# Patient Record
Sex: Male | Born: 1945 | Race: White | Hispanic: No | Smoking: Current every day smoker
Health system: Southern US, Community
[De-identification: ages and names within clinical notes are randomized; demographics above are authoritative.]

## PROBLEM LIST (undated history)

## (undated) DIAGNOSIS — U071 COVID-19: Secondary | ICD-10-CM

## (undated) DIAGNOSIS — F1010 Alcohol abuse, uncomplicated: Secondary | ICD-10-CM

## (undated) DIAGNOSIS — K219 Gastro-esophageal reflux disease without esophagitis: Secondary | ICD-10-CM

## (undated) DIAGNOSIS — R011 Cardiac murmur, unspecified: Secondary | ICD-10-CM

## (undated) DIAGNOSIS — I484 Atypical atrial flutter: Secondary | ICD-10-CM

## (undated) DIAGNOSIS — I4891 Unspecified atrial fibrillation: Secondary | ICD-10-CM

## (undated) DIAGNOSIS — E785 Hyperlipidemia, unspecified: Secondary | ICD-10-CM

## (undated) DIAGNOSIS — J449 Chronic obstructive pulmonary disease, unspecified: Secondary | ICD-10-CM

## (undated) DIAGNOSIS — R002 Palpitations: Secondary | ICD-10-CM

## (undated) DIAGNOSIS — J189 Pneumonia, unspecified organism: Secondary | ICD-10-CM

## (undated) DIAGNOSIS — I639 Cerebral infarction, unspecified: Secondary | ICD-10-CM

## (undated) DIAGNOSIS — I10 Essential (primary) hypertension: Secondary | ICD-10-CM

## (undated) DIAGNOSIS — D649 Anemia, unspecified: Secondary | ICD-10-CM

## (undated) DIAGNOSIS — C4490 Unspecified malignant neoplasm of skin, unspecified: Secondary | ICD-10-CM

## (undated) HISTORY — DX: Essential (primary) hypertension: I10

## (undated) HISTORY — DX: Palpitations: R00.2

## (undated) HISTORY — DX: Cerebral infarction, unspecified: I63.9

## (undated) HISTORY — PX: FRACTURE SURGERY: SHX138

## (undated) HISTORY — PX: ATRIAL FIBRILLATION ABLATION: SHX5732

## (undated) HISTORY — DX: Hyperlipidemia, unspecified: E78.5

## (undated) HISTORY — DX: Atypical atrial flutter: I48.4

## (undated) HISTORY — PX: TONSILLECTOMY: SUR1361

## (undated) HISTORY — PX: EYE SURGERY: SHX253

## (undated) HISTORY — PX: APPENDECTOMY: SHX54

## (undated) HISTORY — DX: Alcohol abuse, uncomplicated: F10.10

---

## 2005-12-04 ENCOUNTER — Inpatient Hospital Stay (HOSPITAL_COMMUNITY): Admission: EM | Admit: 2005-12-04 | Discharge: 2005-12-05 | Payer: Self-pay | Admitting: Emergency Medicine

## 2006-01-10 ENCOUNTER — Emergency Department (HOSPITAL_COMMUNITY): Admission: EM | Admit: 2006-01-10 | Discharge: 2006-01-10 | Payer: Self-pay | Admitting: Emergency Medicine

## 2006-02-01 ENCOUNTER — Emergency Department (HOSPITAL_COMMUNITY): Admission: EM | Admit: 2006-02-01 | Discharge: 2006-02-01 | Payer: Self-pay | Admitting: Emergency Medicine

## 2006-05-02 ENCOUNTER — Observation Stay (HOSPITAL_COMMUNITY): Admission: EM | Admit: 2006-05-02 | Discharge: 2006-05-02 | Payer: Self-pay | Admitting: Emergency Medicine

## 2006-11-15 ENCOUNTER — Inpatient Hospital Stay (HOSPITAL_COMMUNITY): Admission: EM | Admit: 2006-11-15 | Discharge: 2006-11-20 | Payer: Self-pay | Admitting: Emergency Medicine

## 2006-11-15 ENCOUNTER — Encounter (INDEPENDENT_AMBULATORY_CARE_PROVIDER_SITE_OTHER): Payer: Self-pay | Admitting: Cardiology

## 2006-11-15 ENCOUNTER — Ambulatory Visit: Payer: Self-pay | Admitting: Vascular Surgery

## 2006-11-15 ENCOUNTER — Encounter: Payer: Self-pay | Admitting: Vascular Surgery

## 2007-11-18 ENCOUNTER — Ambulatory Visit: Payer: Self-pay | Admitting: Internal Medicine

## 2007-12-10 ENCOUNTER — Ambulatory Visit: Payer: Self-pay | Admitting: Internal Medicine

## 2007-12-10 ENCOUNTER — Encounter: Payer: Self-pay | Admitting: Internal Medicine

## 2008-04-24 ENCOUNTER — Ambulatory Visit: Payer: Self-pay | Admitting: Internal Medicine

## 2008-05-07 ENCOUNTER — Ambulatory Visit: Payer: Self-pay | Admitting: Internal Medicine

## 2008-05-07 ENCOUNTER — Encounter: Payer: Self-pay | Admitting: Internal Medicine

## 2008-05-08 ENCOUNTER — Encounter: Payer: Self-pay | Admitting: Internal Medicine

## 2009-04-02 ENCOUNTER — Ambulatory Visit (HOSPITAL_COMMUNITY): Admission: RE | Admit: 2009-04-02 | Discharge: 2009-04-03 | Payer: Self-pay | Admitting: Orthopedic Surgery

## 2009-10-01 ENCOUNTER — Encounter: Payer: Self-pay | Admitting: Internal Medicine

## 2009-10-01 ENCOUNTER — Telehealth: Payer: Self-pay | Admitting: Internal Medicine

## 2010-05-06 ENCOUNTER — Encounter: Payer: Self-pay | Admitting: Internal Medicine

## 2010-10-13 NOTE — Medication Information (Signed)
Summary: Omeprazole/[facility]  Omeprazole/[facility]   Imported By: Sherian Rein 10/06/2009 14:47:15  _____________________________________________________________________  External Attachment:    Type:   Image     Comment:   External Document

## 2010-10-13 NOTE — Progress Notes (Signed)
Summary: Medication Refill   Phone Note From Pharmacy   Caller: Aris Georgia  [phone] Call For: Dr. Marina Goodell  Summary of Call: Wants to know if Pt. can get a 3 months supply of Omeprazole because he will be without insurance for a couple of months Initial call taken by: Karna Christmas,  October 01, 2009 11:12 AM  Follow-up for Phone Call        Rx. faxed to [facility] for 90 day supply. Follow-up by: Milford Cage NCMA,  October 01, 2009 4:20 PM

## 2010-10-13 NOTE — Letter (Signed)
Summary: Colonoscopy Letter  [facility]  [address] [place], [place] [address]   Phone: [phone]  Fax: [phone]      May 06, 2010 MRN: [id number]   [facility] [address] [place], [place]  [address]   Dear Mr. EVENSON,   According to your medical record, it is time for you to schedule a Colonoscopy. The American Cancer Society recommends this procedure as a method to detect early colon cancer. Patients with a family history of colon cancer, or a personal history of colon polyps or inflammatory bowel disease are at increased risk.  This letter has been generated based on the recommendations made at the time of your procedure. If you feel that in your particular situation this may no longer apply, please contact our office.  Please call our office at [phone] to schedule this appointment or to update your records at your earliest convenience.  Thank you for cooperating with [place] to provide you with the very best care possible.   Sincerely,  Wilhemina Bonito. Marina Goodell, M.D.  [facility] [phone]

## 2010-11-03 ENCOUNTER — Encounter: Payer: Self-pay | Admitting: Internal Medicine

## 2010-11-07 ENCOUNTER — Encounter: Payer: Self-pay | Admitting: Internal Medicine

## 2010-11-17 NOTE — Medication Information (Signed)
Summary: [facility]  [facility]   Imported By: Lester [place] 11/07/2010 10:19:03  _____________________________________________________________________  External Attachment:    Type:   Image     Comment:   External Document

## 2010-11-17 NOTE — Medication Information (Signed)
Summary: Omeprazole / [facility]  Omeprazole / [facility]   Imported By: Lennie Odor 11/08/2010 11:43:25  _____________________________________________________________________  External Attachment:    Type:   Image     Comment:   External Document

## 2010-12-18 LAB — DIFFERENTIAL
Basophils Relative: 1 % (ref 0–1)
Eosinophils Absolute: 0.1 10*3/uL (ref 0.0–0.7)
Monocytes Absolute: 0.4 10*3/uL (ref 0.1–1.0)
Monocytes Relative: 7 % (ref 3–12)

## 2010-12-18 LAB — BASIC METABOLIC PANEL
BUN: 19 mg/dL (ref 6–23)
CO2: 26 mEq/L (ref 19–32)
Chloride: 105 mEq/L (ref 96–112)
Creatinine, Ser: 0.82 mg/dL (ref 0.4–1.5)
Glucose, Bld: 102 mg/dL — ABNORMAL HIGH (ref 70–99)
Potassium: 4.4 mEq/L (ref 3.5–5.1)

## 2010-12-18 LAB — CBC
MCV: 96.5 fL (ref 78.0–100.0)
WBC: 6 10*3/uL (ref 4.0–10.5)

## 2010-12-18 LAB — URINALYSIS, ROUTINE W REFLEX MICROSCOPIC
Ketones, ur: NEGATIVE mg/dL
Nitrite: NEGATIVE
Protein, ur: NEGATIVE mg/dL
Specific Gravity, Urine: 1.012 (ref 1.005–1.030)
pH: 5 (ref 5.0–8.0)

## 2010-12-18 LAB — PROTIME-INR
INR: 1.1 (ref 0.00–1.49)
INR: 1.6 — ABNORMAL HIGH (ref 0.00–1.49)
Prothrombin Time: 19.7 seconds — ABNORMAL HIGH (ref 11.6–15.2)

## 2011-01-24 NOTE — Op Note (Signed)
Samuel Ferrell, Samuel Ferrell NO.:  [id number]   MEDICAL RECORD NO.:  [id number]          PATIENT TYPE:  OIB   LOCATION:  5038                         FACILITY:  [facility]   PHYSICIAN:  Almedia Balls. Ranell Patrick, M.D. DATE OF BIRTH:  12-Mar-1946   DATE OF PROCEDURE:  04/02/2009  DATE OF DISCHARGE:                               OPERATIVE REPORT   PREOPERATIVE DIAGNOSIS:  Right grade 5 acromioclavicular separation.   POSTOPERATIVE DIAGNOSIS:  Right grade 5 acromioclavicular separation.   PROCEDURE PERFORMED:  Right shoulder open reduction of acromioclavicular  joint with coracoclavicular fixation using Arthrex TightRope.   ATTENDING SURGEON:  Almedia Balls. Ranell Patrick, MD   ASSISTANT:  None.   ANESTHESIA:  General anesthesia plus interscalene block anesthesia was  used.   ESTIMATED BLOOD LOSS:  Minimal.   FLUID REPLACEMENT:  800 mL of crystalloid.   INSTRUMENT COUNTS:  Correct.   There were no complications.   Perioperative antibiotics were given.   INDICATIONS:  The patient is a 65 year old male who suffered a fall  injuring his right shoulder.  The patient presented to Orthopedics with  a very painful grade 5 AC separation.  I counseled the patient regarding  treatment options including conservative versus surgical treatment.  The  patient elected to proceed with surgery.  Informed consent was obtained.   DESCRIPTION OF PROCEDURE:  After an adequate level of anesthesia was  achieved, the patient was placed in a modified beach-chair position.  The right shoulder was sterilely prepped and draped in the usual manner.  We initially made an incision about a centimeter medial to the [facility] joint  in Langer skin lines.  This was a saber-type incision.  Dissection was  carried down through subcutaneous tissues using the needle tip Bovie,  identified the deltotrapezial fascia, and split that in line with the  distal clavicle.  Subperiosteal dissection of the distal clavicle was  performed.   We removed the torn disk in between the clavicle and the  acromion and inspected the [facility] joint to make sure that was cleaned out.  We then went ahead and made a separate incision just lateral to the  coracoid process and this was about a 4-cm to 5-cm incision.  We found  the deltopectoral interval and mobilized the cephalic vein laterally  with the deltoid, identified the coracoid process and cleared out the  soft tissue overlying the top of the coracoid process and also split the  conjoined tendon distal to the coracoid and bluntly dissected with a  hemostat underneath the coracoid process.  We then went ahead and  drilled using a 4.5 Arthrex drill bit centered on the clavicle directly  overlying the coracoid process.  Using the C-arm, we drilled both  cortices of clavicle.  We then through a separate hole through the  deltoid anterior to the clavicle, we went ahead and drilled the coracoid  process and mid coracoid and made sure that was centered as well.  We  then passed the TightRope anchor from the superior portion of the  clavicle down through the coracoclavicular interval through the coracoid  process and flipped the Arthrex TightRope button on the underside of the  coracoid process and brought that up securely against the coracoid.  We  imaged that with the C-arm to make sure we were turned and then we went  ahead and cinched down the TightRope device having the middle button on  top of the clavicle.  We lifted up on the elbow and then pushed down the  clavicle to make sure we had best reduction we could.  Once the  TightRope was secure and we had good visualization with the C-arm, we  went ahead and tied it.  We tied this with FiberWire sutures and then  following completion of that we got our final images.  We thoroughly  irrigated and closed the deltotrapezial fascia with 0 Vicryl figure-of-  eight suture followed by 2-0 Vicryl subcutaneous closure, and 4-0  Monocryl for skin.   We used 0 Vicryl for the deltopectoral closure, 2-0  Vicryl subcutaneous closure, and 4-0 running Monocryl skin.  Steri-  Strips were applied followed by a sterile compressive bandage and  shoulder sling immobilizer.  The patient tolerated the surgery well.      Almedia Balls. Ranell Patrick, M.D.  Electronically Signed     SRN/MEDQ  D:  04/02/2009  T:  04/03/2009  Job:  [id number]

## 2011-01-24 NOTE — Assessment & Plan Note (Signed)
[facility]                         GASTROENTEROLOGY OFFICE NOTE   Samuel Ferrell, Samuel Ferrell                         MRN:          [id number]  DATE:11/18/2007                            DOB:          Nov 15, 1945    REASON FOR CONSULTATION:  Iron deficiency anemia.   HISTORY:  This is a pleasant 65 year old white male with history of  atrial Fibrillation/atrial flutter status post ablation therapy,  hypertension, hyperlipidemia, and possible TIA.  He is referred today  through the courtesy of Dr. Wylene Simmer regarding microcytic anemia, likely  iron deficient.  Patient underwent routine blood work October 02, 2007.  He was found to be anemic with a hemoglobin of 11.9.  His MCV was  depressed at 76.5.  The patient denies melena or hematochezia.  He  denies a prior history of anemia.  He does have intermittent problems  with nosebleeds for which he occasionally will hold his Coumadin.  He  has problems about one a month or every other month.  He denies a prior  history of GI problems.  Also denies having had any prior GI evaluations  such as endoscopy or colonoscopy.  No other active symptoms such as  dysphagia, abdominal pain or change in bowel habits.  He does have  occasional indigestion and heartburn which he attributes to dietary  indiscretion.   PAST MEDICAL HISTORY:  As above.   PAST SURGICAL HISTORY:  Status post appendectomy.   ALLERGIES:  No known drug allergies.   CURRENT MEDICATIONS:  1. Enalapril 20 mg nightly.  2. Rythmol 225 mg b.i.d.  3. Metoprolol 50 mg b.i.d.  4. Lipitor 40 mg daily.  5. Warfarin 7.5 mg daily.  6. Aspirin 81 mg daily.  7. Zantac 75 mg daily.   FAMILY HISTORY:  No family history of gastrointestinal malignancy.   SOCIAL HISTORY:  The patient is single with two children.  He lives with  his fiance of 25 years.  He has a GED degree and works as a Hotel manager.  He smokes about a pack of cigarettes per day.  Drinks two  beers  per day and 6 to 12 beers on weekends.   REVIEW OF SYSTEMS:  Per diagnostic evaluation form.   PHYSICAL EXAMINATION:  Well appearing male in no acute distress.  Blood  pressure 132/70, heart rate 76.  Weight is 184 pounds.  He is 6 feet 2  inches in height.  HEENT:  Sclerae anicteric.  Conjunctivae are pink.  Oral mucosa is  intact.  There is no adenopathy.  LUNGS:  Clear.  HEART:  Regular.  ABDOMEN:  Soft without tenderness, mass or hernia.  Good bowel sounds  heard.  EXTREMITIES:  Without edema.   IMPRESSION:  This is a 65 year old gentleman with multiple medical  problems including a prior history of atrial fibrillation/flutter for  which he has undergone ablation.  He is on chronic systemic  anticoagulation in the form of Coumadin.  He presents now with  microcytic anemia, likely iron deficiency.  The patient could have  intermittent chronic blood loss from nosebleeds. Alternatively, he could  have occult gastrointestinal mucosal pathology such as neoplasia,  arteriovenous malformations or acid peptic disorders.   RECOMMENDATIONS:  1. Colonoscopy and upper endoscopy to evaluate iron deficiency anemia      as well as provide colorectal neoplasia and Barrrett's screening.      The nature of the procedure as well as risks, benefits and      alternatives have been reviewed.  He understood and agreed to      proceed.  2. Hold Coumadin four days prior to the procedure if okay with Dr.      Donnie Aho.     Wilhemina Bonito. Marina Goodell, MD  Electronically Signed    JNP/MedQ  DD: 11/19/2007  DT: 11/19/2007  Job #: [id number]   cc:   Gaspar Garbe, M.D.  Georga Hacking, M.D.

## 2011-01-24 NOTE — Discharge Summary (Signed)
NAMESAMIR, Samuel Ferrell NO.:  [id number]   MEDICAL RECORD NO.:  [id number]          PATIENT TYPE:  OIB   LOCATION:  5038                         FACILITY:  [facility]   PHYSICIAN:  Almedia Balls. Ranell Patrick, M.D. DATE OF BIRTH:  [birth year]   DATE OF ADMISSION:  04/02/2009  DATE OF DISCHARGE:  04/03/2009                               DISCHARGE SUMMARY   ADMITTING DIAGNOSIS:  Right shoulder grade 5 acromioclavicular  separation.   DISCHARGE DIAGNOSIS:  Right shoulder grade 5 acromioclavicular  separation.   PROCEDURE PERFORMED:  Open reduction of right acromioclavicular  separation with coracoclavicular fixation performed by Dr. Ranell Patrick on  April 02, 2009.   CONSULTING SERVICES:  None.   HISTORY OF PRESENT ILLNESS:  The patient is a 65 year old male, who  suffered a severe injury to the right shoulder with a resulting AC  separation, grade 5.  The patient presented to Orthopedics, it was  deemed that the patient had required operative stabilization of his [facility]  separation, and the patient presents now for operative treatment.  For  further details on the patient's past medical history and physical  examination, please see the hospital record.   HOSPITAL COURSE:  The patient admitted to the Orthopedics and taken to  Surgery on April 02, 2009, where he had an open repair of his [facility]  separation with coracoclavicular fixation using the Arthrex TightRope.  The patient had uncomplicated postoperative course, remaining afebrile,  comfortable on oral pain medications with Percocet and Robaxin.  He was  discharged to home with instructions to stay in a sling.  He  will  change to Band-Aids tomorrow on July 25.  He can shower 7 days  postoperatively.  He will be on Percocet 7.5/325 one to two p.o. q.4-6  h. p.r.n. and Robaxin 500 mg 1 p.o. q.6 h. p.r.n. spasms.  He will be  instructed to ice his shoulder.  No lifting, pushing, or pulling.  He  will follow up with [facility] in  2 weeks.      Almedia Balls. Ranell Patrick, M.D.  Electronically Signed     SRN/MEDQ  D:  04/03/2009  T:  04/03/2009  Job:  [id number]

## 2011-01-27 NOTE — Consult Note (Signed)
Samuel Ferrell, Samuel Ferrell NO.:  [id number]   MEDICAL RECORD NO.:  [id number]          PATIENT TYPE:  EMS   LOCATION:  MAJO                         FACILITY:  [facility]   PHYSICIAN:  Gustavus Messing. Orlin Hilding, M.D.DATE OF BIRTH:  12/24/1945   DATE OF CONSULTATION:  11/15/2006  DATE OF DISCHARGE:                                 CONSULTATION   CHIEF COMPLAINT:  Dizziness and left arm numbness.   HISTORY OF PRESENT ILLNESS:  [name] is a 65 year old right-  handed white male with a past medical history significant for atrial  fibrillation, atrial flutter, just  paroxysmal and hypertension.  He had  a catheter ablation procedure in September 2007 by physicians at [facility] but has continued to have AFib flutter.  He was to have a  repeat ablation in a few weeks.  He went to work as usual this morning,  had sudden onset severe vertigo with left arm numbness and clumsiness  which lasted about 25 minutes.  At that time, they also felt that his  neck with stiff.   REVIEW OF SYSTEMS:  Negative for any speech or vision changes, any  headache or any lower extremity symptoms.  No headache, no indigestion.  He can occasionally feels palpitations.   PAST MEDICAL HISTORY:  Is positive for the paroxysmal atrial  fibrillation, atrial flutter, hypertension.  He has had the ablation  procedure, and he is status post remote appendectomy.   MEDICATIONS:  1. Aspirin 81 mg once a day.  2. Enalapril 20 mg q.h.s.  3. Metoprolol ER 50 mg b.i.d.  4. Rythmol 425 mg b.i.d.   ALLERGIES:  NO KNOWN DRUG ALLERGIES.   SOCIAL HISTORY:  Lives with his fiancee.  He works at a Restaurant manager, fast food.  He drinks 4-5 beers per day, smokes one pack of cigarettes a day.   FAMILY HISTORY:  Positive for heart disease but negative for stroke.   EXAM:  VITAL SIGNS:  Temperature is 97.8, pulse 78, BP is 130/91,  respirations 18, 100% SATs.  CT of the head shows atrophy but is  negative for any acute  stroke process.  CBC is unremarkable, as is the  BMET.  HEAD:  Normocephalic, atraumatic.  NECK:  Supple without bruits.  HEART:  Irregularly irregular.  EXTREMITIES:  Without edema.  NEUROLOGIC:  Mental status awake, alert and oriented with normal  language.  Pupils are equal and reactive.  Visual fields are full.  Extraocular movements are intact.  Facial sensation is normal.  Facial  motor activity is normal.  Hearing is intact, palate is symmetric and  tongue is midline.  Motor exam:  Has got normal bulk and strength  throughout, 5/5 strength in all four extremities.  No drift.  Deep  tendon reflexes are 1+ with downgoing toes.  Coordination:  Finger-to-  nose and heel-to-shin are intact.  Sensory exam is normal with the  exception of some decreased sensation to pinprick on the left lower  extremity.  On the NIH stroke scale, he scores 1 just for the decreased  sensation on item 8.  Items A, 1A and B level of alertness orientation,  following commands is all normal, gets a zero.  Items 2, 3, and 4:  Visual field extraocular movements and facial motor activity are all  normal, gets zero.  Items 5A and B and 6A and B:  Drift in the upper and  lower extremities that is absent, gets zero.  Item 7:  Ataxia is absent.  He gets a zero.  Items 8:  Sensation is noted, gets a 1 point for  decreased sensation in the left lower extremity.  Item 9 and 10:  No  evidence of dysarthria or dysphasia.  No evidence of aphasia or  dysarthria.  Item 11:  No extension.   IMPRESSION:  Possible right brain transient ischemic attack in patient  with known paroxysmal atrial flutter, who was not anticoagulated.   RECOMMENDATIONS:  Would agree with completing the stroke workup with  MRI, MRA and carotid Doppler, 2-D echo, lipids and homocystine.  I agree  he will need to be anticoagulated, particularly since ablation is not  going to be contemplated in near future.  We will put him on Heparin  protocol and  his Coumadin protocol at the same time for the a-fib, while  here.      Catherine A. Orlin Hilding, M.D.  Electronically Signed     CAW/MEDQ  D:  11/15/2006  T:  11/15/2006  Job:  [id number]

## 2011-01-27 NOTE — H&P (Signed)
NAMEHAGOP, Samuel Ferrell NO.:  [id number]   MEDICAL RECORD NO.:  [id number]          PATIENT TYPE:  INP   LOCATION:  1824                         FACILITY:  [facility]   PHYSICIAN:  Georga Hacking, M.D.DATE OF BIRTH:  April 27, 1946   DATE OF ADMISSION:  05/02/2006  DATE OF DISCHARGE:                                HISTORY & PHYSICAL   REASON FOR ADMISSION:  Atrial flutter.   HISTORY:  The patient is a 64 year old male with history of recurrent  paroxysmal atrial fibrillation and flutter.  He has been managed with Toprol-  XL and Rythmol at home and has ablation for atrial flutter and fibrillation  scheduled for September 10.  He has had episodic dizziness as well as some  hypertension and tingling of his hands and feet.  He was admitted to the  hospital in March with atrial flutter and had emergency room visits on May 2  as well as May 24.  He will periodically take extra Toprol-XL at home.   He had the onset of rapid palpitations yesterday and took a total of four  Toprol-XL yesterday, but because of persistence of tachycardia and hand  tingling he came to the emergency room this morning where he was found to be  in rapid atrial flutter.  He was given a dose of intravenous Cardizem 10 mg  which resulted in slowing of his rhythm but with relative hypotension.  This  responded to a fluid bolus.  Additional Cardizem was attempted at 5 mg but  he had mild hypotension with this and is admitted for further evaluation.   PAST HISTORY:  Is remarkable for hypertension and a history of dyspepsia.  He has been previously worked up with a Cardiolite that did not show  ischemia.   PREVIOUS SURGICAL HISTORY:  Appendectomy.   ALLERGIES:  None.   CURRENT MEDICATIONS:  1. Enalapril 20 mg daily.  2. Toprol-XL 50 b.i.d.  He has recently changed to a generic version of      this.  3. Rythmol XR 425 mg b.i.d.  4. Aspirin daily.   SOCIAL HISTORY:  He is divorced and has a live-in  girlfriend.  He has  children by a previous marriage.  He drinks three to four beers per day and  has smoked a pack of cigarettes per day against advice.   FAMILY HISTORY:  Is recorded in the old records, is reviewed from March  2007.   REVIEW OF SYSTEMS:  His weight has been stable.  He wears glasses.  He  normally works in a Careers adviser.  He has dyspepsia and indigestion  relieved with Xanax.  No history of impotence or arthritis.   EXAMINATION:  GENERAL:  He is a pleasant male who is currently in no acute  distress.  VITAL SIGNS:  Blood pressure is currently 100/60, pulse was currently 67  with atrial flutter noted.  SKIN:  Warm and dry.  HEENT:  EOMI, PERRLA, C&S clear, fundi unremarkable, pharynx negative.  NECK:  Supple without masses, JVD, thyromegaly, or bruits.  LUNGS:  Clear.  CARDIAC:  Normal S1 and S2, no S3, no murmur.  ABDOMEN:  Soft, nontender.  EXTREMITIES:  Distal pulses 2+.   EKG shows atrial flutter.  Lab data showed negative cardiac enzymes, normal  chemistry panel.   IMPRESSION:  1. Recurrent paroxysmal atrial fibrillation and flutter, scheduled for      ablation on May 21, 2006, at [facility].  2. Relative hypotension.  3. History of hypertension.  4. Alcohol abuse.  5. Cigarette abuse.   RECOMMENDATIONS:  The patient will be started on IV heparin and will be  n.p.o.  If he fails to convert to sinus rhythm will do a cardioversion on  him later today.  The procedure was discussed with him fully including risks  and he is agreeable to proceed.  We also mentioned cessation of alcohol and  alcohol as a contributing factor for atrial flutter and fibrillation, and  also the importance of discontinuation of cigarette abuse.      Georga Hacking, M.D.  Electronically Signed     WST/MEDQ  D:  05/02/2006  T:  05/02/2006  Job:  [id number]   cc:   Clydie Braun, M.D.

## 2011-01-27 NOTE — Discharge Summary (Signed)
Samuel Ferrell, VANDUNK NO.:  [id number]   MEDICAL RECORD NO.:  [id number]          PATIENT TYPE:  INP   LOCATION:  4733                         FACILITY:  [facility]   PHYSICIAN:  Georga Hacking, M.D.DATE OF BIRTH:  16-Feb-1946   DATE OF ADMISSION:  11/15/2006  DATE OF DISCHARGE:  11/19/2006                               DISCHARGE SUMMARY   FINAL DIAGNOSES:  1. Transient ischemic attack suspected.  2. Atrial flutter resolved.  3. History of atrial flutter and fibrillation.  4. Hypertensive heart disease.  5. Cigarette abuse.   PROCEDURES:  2D echocardiogram, transcranial Doppler, carotid Doppler,  MRI, and MR angiogram.   CONSULTATIONS:  Dr. Marcelino Freestone.   HISTORY OF PRESENT ILLNESS:  The patient is a 65 year old male who has a  history of hypertension as well as recurrent atrial flutter and  fibrillation. She underwent catheter ablation for atrial fibrillation in  September by  Dr. Sampson Goon at [facility], but has had recurrent atrial flutter  since then. He just finished wearing a 48-hour vent monitor and was due  to have a repeat ablation later in March. While at work the morning of  admission he developed the onset of left arm numbness and tingling that  lasted 30 minutes associated with stiffness in his neck. He did not have  any chest pain. Did not have difficulty speaking or walking. Had normal  strength. He took an extra metoprolol and came to the office. He was  seen and was thought to have a suspected TIA and was sent to the  emergency room for further evaluation. Please see the previously  dictated History and Physical for remainder of the details.   HOSPITAL COURSE:  Laboratory work shows normal CBC, normal PT, and PTT  on admission. Sodium 133, potassium 4.8, chloride 98, CO2 27, glucose  88, BUN 18, creatinine 1.25. Hemoglobin A1C 5.3. Homocysteine 13.8 which  was within the normal range. Chemistry panel was normal. Serial CPK,  MB,  and troponins were normal. Lipid panel showed a cholesterol 227 with  triglyceride 129, HDL 43, and LDL cholesterol 158. Alcohol level was  less than 5. A drug screen was negative. Urinalysis was normal.   The patient was admitted to the hospital with a suspected transient  ischemic attack. Initial CT scan did not show any evidence of bleed. He  was seen in consultation by Dr. Marcelino Freestone who thought that he  possibly could have had a transient ischemic attack. He remained in  atrial flutter. He was not previously on Coumadin. Was placed on  heparin. A 2D echocardiogram was done and showed normal left ventricular  function. No overt evidence of clots, some LVH. Doppler examination of  his neck showed no intracranial artery stenosis. He remained in atrial  flutter and was on heparin and was placed on Warfarin. All of his  symptoms resolved the day of admission. He continued on heparin  anticoagulation. Transcranial Dopplers were negative. He was seen by the  tobacco cessation nurse. Given instructions on discontinuation of  smoking. Lipitor 40 mg was started because of  hyperlipidemia and  previous stroke. Blood pressure remained normal. His Warfarin was  adjusted, and his INR slowly came up and was 1.9 the day of discharge.  He was quite anxious to go home, and in my clinical judgment it was okay  to discontinue the heparin and to continue treatment of his  anticoagulation as an outpatient. He was discharged in improved  condition with no neurological deficits and normal blood pressure  control. His INR was 1.9 the day of discharge.   DISCHARGE MEDICATIONS:  1. Warfarin 7.5 mg daily.  2. Rythmol 425 mg twice daily.  3. Enalapril 20 mg daily.  4. Metoprolol Extended Release 50 mg twice daily.  5. Lipitor 40 mg daily.  6. Aspirin 81 mg daily.   He was given discharge instructions concerning Coumadin and also smoking  cessation counseling. He will be seen for a protime INR  on Friday.      Georga Hacking, M.D.  Electronically Signed     WST/MEDQ  D:  11/20/2006  T:  11/20/2006  Job:  [id number]   cc:   Clydie Braun, M.D.  Catherine A. Orlin Hilding, M.D.  Louanna Raw

## 2011-01-27 NOTE — H&P (Signed)
NAMELAKEN, LOBATO NO.:  [id number]   MEDICAL RECORD NO.:  [id number]          PATIENT TYPE:  INP   LOCATION:  3713                         FACILITY:  [facility]   PHYSICIAN:  Georga Hacking, M.D.DATE OF BIRTH:  08/07/1946   DATE OF ADMISSION:  12/04/2005  DATE OF DISCHARGE:                                HISTORY & PHYSICAL   REASON FOR ADMISSION:  Atrial fibrillation.   HISTORY:  The patient is a 65 year old male with a prior history of  hypertension.  He has had episodic spells of dizziness that have been  present for some time, described as brief, and which would seem to occur at  rest and last for a few minutes.  Today, he had the onset of tingling of his  hands and feet, as well as dizziness, and presented to his primary doctor's  office, at which point he was found to be in rapid atrial fibrillation.  His  blood pressure was also quite high there.  He was transported here by EMS,  and has been given Diltiazem.  He has complained of episodic discomfort in  his chest described as indigestion, which was worse with food or eating.  It  was not related to exertion.  He drinks about 4 beers per day.  He has  complained of feeling somewhat jittery, and also complains of a tenseness in  his upper shoulder area when he is at rest, which is relieved when he will  get up and walk around.  He remains in atrial fibrillation and flutter at  this time.   PAST HISTORY:  1.  Hypertension.  2.  History of dyspepsia.  (There is no known history of hyperlipidemia.)   PREVIOUS SURGICAL HISTORY:  Appendectomy.   ALLERGIES:  None.   CURRENT MEDICATIONS:  Enalapril.  He has recently discontinued HCTZ.   FAMILY HISTORY:  Mother died at age 75 with cardiac disease and had a  pacemaker.  Father died at age 55 of COPD.  He has 2 brothers and 2 sisters  living.  One sister has had cancer.   SOCIAL HISTORY:  He is divorced and currently has a live-in girlfriend.  He  has  children by a previous marriage.  He drinks 4 beers per days, and has  smoked a pack of cigarettes a day for at least 30 years.   REVIEW OF SYSTEMS:  His weight has been stable.  He wears glasses and has  been told that he has early cataracts.  He has no significant dyspnea.  He  has no history of angina, PND, orthopnea, syncope or claudication.  He had  minimal palpitations earlier, but is not complaining of palpitations at the  present time.  He has significant dyspepsia and ingestion that is related to  Zantac.  He has no nocturia, dysuria, urgency, frequency or kidney stones.  No history of impotence.  No significant arthritis.  Other than as noted  above, the remainder of the review of systems is unremarkable.   PHYSICAL EXAMINATION:  GENERAL:  He is a pleasant male appearing his  stated  age, in no acute distress.  VITAL SIGNS:  Blood pressure is currently 108/70, pulse 110 and irregular  (it was 150 when I initially saw him prior to the Diltiazem bolus),  respirations were 12.  SKIN:  Warm and dry.  HEENT:  Head was normocephalic with a balding male hair pattern.  He wears  glasses.  Extraocular movement intact.  Pupils equal, round and reactive to  light and accommodation.  Conjunctivae and sclerae clear.  Pharynx negative.  NECK:  Supple without masses, JVD, thyromegaly or bruits.  LUNGS:  Clear to auscultation and percussion.  CARDIAC:  Normal S1 and S2 with regular rhythm.  No murmur.  ABDOMEN:  Soft, nontender.  Femoral distal pulses are 2+.   ELECTROCARDIOGRAM:  A 12-lead EKG shows atrial fibrillation with a rapid  ventricular response, voltage for LVH.   LABORATORY DATA:  Pending at the time of dictation, except for initial point  of care enzymes which are negative.   IMPRESSION:  1.  New onset of atrial fibrillation.  2.  Hypertensive heart disease.  3.  Cigarette abuse.   RECOMMENDATIONS:  Begin Lovenox.  Admit to a telemetry bed.  Intravenous  Diltiazem and  Lopressor.  Control blood pressure.  2-D echocardiogram.  TSH.  Rule out MI.      Georga Hacking, M.D.  Electronically Signed     WST/MEDQ  D:  12/04/2005  T:  12/05/2005  Job:  [id number]   cc:   Antionette Char, M.D.

## 2012-05-28 ENCOUNTER — Encounter: Payer: Self-pay | Admitting: Internal Medicine

## 2012-06-04 ENCOUNTER — Encounter: Payer: Self-pay | Admitting: Internal Medicine

## 2012-11-14 ENCOUNTER — Other Ambulatory Visit: Payer: Self-pay

## 2012-11-20 ENCOUNTER — Emergency Department (HOSPITAL_COMMUNITY)
Admission: EM | Admit: 2012-11-20 | Discharge: 2012-11-20 | Disposition: A | Discharge status: Home / Self Care | Payer: Medicare Other | Attending: Emergency Medicine | Admitting: Emergency Medicine

## 2012-11-20 ENCOUNTER — Encounter (HOSPITAL_COMMUNITY): Payer: Self-pay | Admitting: Emergency Medicine

## 2012-11-20 DIAGNOSIS — Y833 Surgical operation with formation of external stoma as the cause of abnormal reaction of the patient, or of later complication, without mention of misadventure at the time of the procedure: Secondary | ICD-10-CM | POA: Insufficient documentation

## 2012-11-20 DIAGNOSIS — IMO0002 Reserved for concepts with insufficient information to code with codable children: Secondary | ICD-10-CM

## 2012-11-20 DIAGNOSIS — Z9089 Acquired absence of other organs: Secondary | ICD-10-CM | POA: Insufficient documentation

## 2012-11-20 DIAGNOSIS — Z8673 Personal history of transient ischemic attack (TIA), and cerebral infarction without residual deficits: Secondary | ICD-10-CM | POA: Insufficient documentation

## 2012-11-20 DIAGNOSIS — Z85828 Personal history of other malignant neoplasm of skin: Secondary | ICD-10-CM | POA: Insufficient documentation

## 2012-11-20 DIAGNOSIS — I252 Old myocardial infarction: Secondary | ICD-10-CM | POA: Insufficient documentation

## 2012-11-20 DIAGNOSIS — F17200 Nicotine dependence, unspecified, uncomplicated: Secondary | ICD-10-CM | POA: Insufficient documentation

## 2012-11-20 DIAGNOSIS — K929 Disease of digestive system, unspecified: Secondary | ICD-10-CM | POA: Insufficient documentation

## 2012-11-20 HISTORY — DX: Unspecified malignant neoplasm of skin, unspecified: C44.90

## 2012-11-20 HISTORY — DX: Cerebral infarction, unspecified: I63.9

## 2012-11-20 MED ORDER — "THROMBI-PAD 3""X3"" EX PADS"
MEDICATED_PAD | CUTANEOUS | Status: AC
  Filled 2012-11-20: qty 1

## 2012-11-20 NOTE — ED Notes (Signed)
Pt st's he had a skin cancer removed from right forearm on Thur.  Tonight when he changed the bandage area started bleeding and has not stopped.  Pt is currently taking blood thinners.

## 2012-11-20 NOTE — ED Provider Notes (Signed)
Medical screening examination/treatment/procedure(s) were performed by non-physician practitioner and as supervising physician I was immediately available for consultation/collaboration.  Olga M Otter, MD 11/20/12 0148 

## 2012-11-20 NOTE — ED Provider Notes (Signed)
History     CSN: [id number]  Arrival date & time 11/20/12  0000   None     Chief Complaint  Patient presents with  . Coagulation Disorder    (Consider location/radiation/quality/duration/timing/severity/associated sxs/prior treatment) HPI Comments: Skin cancer removed 6 days ago cleaning wound tonight and lossened scab now bleeding  Tried pressure witout relief  The history is provided by the patient.    Past Medical History  Diagnosis Date  . Skin cancer   . CVA (cerebral infarction)   . MI (myocardial infarction)     Past Surgical History  Procedure Laterality Date  . Appendectomy    . Fracture surgery      No family history on file.  History  Substance Use Topics  . Smoking status: Current Every Day Smoker  . Smokeless tobacco: Not on file  . Alcohol Use: Yes     Comment: beer      Review of Systems  Skin: Positive for wound.  All other systems reviewed and are negative.    Allergies  Review of patient's allergies indicates not on file.  Home Medications  No current outpatient prescriptions on file.  BP 132/63  Pulse 79  Temp(Src) 97.8 F (36.6 C) (Oral)  Resp 20  SpO2 97%  Physical Exam  Nursing note and vitals reviewed. Constitutional: He appears well-developed and well-nourished.  HENT:  Head: Normocephalic.  Eyes: Pupils are equal, round, and reactive to light.  Neck: Normal range of motion.  Cardiovascular: Normal rate.   Pulmonary/Chest: Effort normal.  Musculoskeletal: Normal range of motion.  Neurological: He is alert.  Skin:  Rapid oozing from wound     ED Course  Wound closure utilizing adhes only Date/Time: 11/20/2012 1:44 AM Performed by: Arman Filter Authorized by: Arman Filter Consent: Verbal consent obtained. Risks and benefits: risks, benefits and alternatives were discussed Consent given by: patient Patient identity confirmed: verbally with patient Time out: Immediately prior to procedure a "time out" was  called to verify the correct patient, procedure, equipment, support staff and site/side marked as required. Local anesthesia used: no Patient sedated: no Patient tolerance: Patient tolerated the procedure well with no immediate complications. Comments: Cleaned area placed thrombi pad and pressure dressing    (including critical care time)  Labs Reviewed - No data to display No results found.   1. 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

## 2013-04-14 ENCOUNTER — Emergency Department (HOSPITAL_COMMUNITY)
Admission: EM | Admit: 2013-04-14 | Discharge: 2013-04-14 | Disposition: A | Discharge status: Home / Self Care | Payer: Medicare Other | Attending: Emergency Medicine | Admitting: Emergency Medicine

## 2013-04-14 ENCOUNTER — Encounter (HOSPITAL_COMMUNITY): Payer: Self-pay | Admitting: *Deleted

## 2013-04-14 ENCOUNTER — Emergency Department (HOSPITAL_COMMUNITY): Payer: Medicare Other

## 2013-04-14 DIAGNOSIS — F17200 Nicotine dependence, unspecified, uncomplicated: Secondary | ICD-10-CM | POA: Insufficient documentation

## 2013-04-14 DIAGNOSIS — R002 Palpitations: Secondary | ICD-10-CM | POA: Insufficient documentation

## 2013-04-14 DIAGNOSIS — Z8673 Personal history of transient ischemic attack (TIA), and cerebral infarction without residual deficits: Secondary | ICD-10-CM | POA: Insufficient documentation

## 2013-04-14 DIAGNOSIS — I4891 Unspecified atrial fibrillation: Secondary | ICD-10-CM | POA: Insufficient documentation

## 2013-04-14 DIAGNOSIS — R42 Dizziness and giddiness: Secondary | ICD-10-CM | POA: Insufficient documentation

## 2013-04-14 DIAGNOSIS — J449 Chronic obstructive pulmonary disease, unspecified: Secondary | ICD-10-CM | POA: Insufficient documentation

## 2013-04-14 DIAGNOSIS — J4489 Other specified chronic obstructive pulmonary disease: Secondary | ICD-10-CM | POA: Insufficient documentation

## 2013-04-14 DIAGNOSIS — I252 Old myocardial infarction: Secondary | ICD-10-CM | POA: Insufficient documentation

## 2013-04-14 DIAGNOSIS — Z8679 Personal history of other diseases of the circulatory system: Secondary | ICD-10-CM | POA: Insufficient documentation

## 2013-04-14 DIAGNOSIS — Z85828 Personal history of other malignant neoplasm of skin: Secondary | ICD-10-CM | POA: Insufficient documentation

## 2013-04-14 HISTORY — DX: Chronic obstructive pulmonary disease, unspecified: J44.9

## 2013-04-14 HISTORY — DX: Unspecified atrial fibrillation: I48.91

## 2013-04-14 LAB — BASIC METABOLIC PANEL
CO2: 22 mEq/L (ref 19–32)
Glucose, Bld: 94 mg/dL (ref 70–99)
Potassium: 3.8 mEq/L (ref 3.5–5.1)
Sodium: 138 mEq/L (ref 135–145)

## 2013-04-14 LAB — POCT I-STAT TROPONIN I

## 2013-04-14 LAB — CBC
MCH: 34.9 pg — ABNORMAL HIGH (ref 26.0–34.0)
RBC: 4.27 MIL/uL (ref 4.22–5.81)

## 2013-04-14 NOTE — ED Provider Notes (Signed)
CSN: [id number]     Arrival date & time 04/14/13  1119 History     First MD Initiated Contact with Patient 04/14/13 1122     Chief Complaint  Patient presents with  . Tachycardia  . Irregular Heart Beat  . Shortness of Breath   (Consider location/radiation/quality/duration/timing/severity/associated sxs/prior Treatment) The history is provided by the patient.  Samuel Ferrell is a 67 y.o. male hx of CVA, MI, Afib s/p 2 ablations on xarelto here with palpitations and dizziness. He was doing some work and also had some palpitations and felt dizzy. He sat down and did not pass out. Called the ambulance and was diagnosed with possible SVT was given adenosine 6 mg and 12 mg en route and now is back to sinus rhythm. He said this is similar to his past atrial fibrillation. He is currently on propranolol and he is compliant with it. Denies chest pain.    Past Medical History  Diagnosis Date  . Skin cancer   . CVA (cerebral infarction)   . MI (myocardial infarction)   . Afib   . COPD (chronic obstructive pulmonary disease)    Past Surgical History  Procedure Laterality Date  . Appendectomy    . Fracture surgery    . Ablation     No family history on file. History  Substance Use Topics  . Smoking status: Current Every Day Smoker  . Smokeless tobacco: Not on file  . Alcohol Use: Yes     Comment: beer    Review of Systems  Cardiovascular: Positive for palpitations.  Neurological: Positive for dizziness.  All other systems reviewed and are negative.    Allergies  Review of patient's allergies indicates no known allergies.  Home Medications  No current outpatient prescriptions on file. BP 117/63  Pulse 78  Temp(Src) 97.7 F (36.5 C) (Oral)  Resp 13  SpO2 99% Physical Exam  Nursing note and vitals reviewed. Constitutional: He is oriented to person, place, and time. He appears well-developed and well-nourished.  HENT:  Head: Normocephalic.  Mouth/Throat: Oropharynx is clear  and moist.  Eyes: Conjunctivae are normal. Pupils are equal, round, and reactive to light.  Neck: Normal range of motion. Neck supple.  Cardiovascular: Normal rate, regular rhythm and normal heart sounds.   Pulmonary/Chest: Effort normal and breath sounds normal. No respiratory distress. He has no wheezes. He has no rales.  Abdominal: Soft. Bowel sounds are normal. He exhibits no distension. There is no tenderness. There is no rebound and no guarding.  Musculoskeletal: Normal range of motion. He exhibits no edema and no tenderness.  Neurological: He is alert and oriented to person, place, and time.  Skin: Skin is warm and dry.  Psychiatric: He has a normal mood and affect. His behavior is normal. Judgment and thought content normal.    ED Course   Procedures (including critical care time)  Labs Reviewed  CBC - Abnormal; Notable for the following:    MCH 34.9 (*)    MCHC 36.2 (*)    All other components within normal limits  BASIC METABOLIC PANEL - Abnormal; Notable for the following:    GFR calc non Af Amer 77 (*)    GFR calc Af Amer 89 (*)    All other components within normal limits  POCT I-STAT TROPONIN I   Dg Chest Port 1 View  04/14/2013   *RADIOLOGY REPORT*  Clinical Data: Irregular heart beat, shortness of breath  PORTABLE CHEST - 1 VIEW  Comparison: March 31, 2009, May 02, 2006  Findings: The lungs are hyperinflated.  Calcified pleural plaques are identified.  There is biapical pleural thickening.  There is no focal infiltrate, pulmonary edema, or pleural effusion.  Bilateral calcified granulomas are identified.  The mediastinal contour and cardiac silhouette are normal.  The soft tissues and osseous structures are stable.  IMPRESSION: Stable chronic changes.  No acute cardiopulmonary disease identified.   Original Report Authenticated By: Sherian Rein, M.D.   No diagnosis found.   Date: 04/14/2013  Rate: 91  Rhythm: normal sinus rhythm  QRS Axis: normal  Intervals:  normal  ST/T Wave abnormalities: early repolarization  Conduction Disutrbances:none  Narrative Interpretation:   Old EKG Reviewed: unchanged    MDM  Samuel Ferrell is a 67 y.o. male here with palpitations. Likely rapid afib now back in sinus. Will check basic labs. Will call cardiologist, Dr. Donnie Aho, for eval.   2:09 PM I talked to Dr. Donnie Aho, who recommend outpatient f/u. Labs unremarkable, trop neg x 1 and he has no chest pain. He will f/u with Dr. Donnie Aho.    Richardean Canal, MD 04/14/13 [phone]

## 2013-04-14 NOTE — ED Notes (Addendum)
While at work starting having sob and palpitations.  Hx. Of afib. Hr. 190.  EMS gave 6, 12 mg of Adenosine and converted to NSR. cbg 101. Note: On scene: 86% Saot. On RA; after cnverting to NSR, >95% Saot's. Frequent episodes of SVT.

## 2013-04-14 NOTE — ED Notes (Signed)
Yao, MD at bedside.  

## 2014-01-29 ENCOUNTER — Encounter: Payer: Self-pay | Admitting: Internal Medicine

## 2014-01-29 ENCOUNTER — Ambulatory Visit (INDEPENDENT_AMBULATORY_CARE_PROVIDER_SITE_OTHER): Payer: Medicare Other | Admitting: Internal Medicine

## 2014-01-29 VITALS — BP 160/81 | HR 77 | Ht 73.5 in | Wt 188.0 lb

## 2014-01-29 DIAGNOSIS — I4891 Unspecified atrial fibrillation: Secondary | ICD-10-CM

## 2014-01-29 DIAGNOSIS — I4892 Unspecified atrial flutter: Secondary | ICD-10-CM

## 2014-01-29 DIAGNOSIS — F1010 Alcohol abuse, uncomplicated: Secondary | ICD-10-CM

## 2014-01-29 DIAGNOSIS — F17200 Nicotine dependence, unspecified, uncomplicated: Secondary | ICD-10-CM

## 2014-01-29 DIAGNOSIS — I484 Atypical atrial flutter: Secondary | ICD-10-CM

## 2014-01-29 DIAGNOSIS — Z720 Tobacco use: Secondary | ICD-10-CM | POA: Insufficient documentation

## 2014-01-29 MED ORDER — FLECAINIDE ACETATE 50 MG PO TABS: 50.0000 mg | ORAL_TABLET | Freq: Two times a day (BID) | ORAL | Status: DC

## 2014-01-29 NOTE — Progress Notes (Signed)
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 male with a h/o recurrent atrial fibrillation and atypical flutter who presents for Ep consultation.  He reports having previously symptomatic afib for which he was evaluated by Dr [name] and underwent atrial fibrillation ablation in 2007 and 2009.  He has done quite well post ablation.  He has recently developed current symptoms of palpitations.  He has worn an event monitor by Dr Wynonia Lawman which reveals atypical atrial flutter.  He reports symtoms of palpitations and fatigue.  He also has other palpitations which sound more like pacs, nonsustained arrhythmias which are worse with work related stress. He drinks heavily and reports up to 6 beers at a time.   Today, he denies symptoms of chest pain, shortness of breath, orthopnea, PND, lower extremity edema, dizziness, presyncope, syncope, or neurologic sequela. The patient is tolerating medications without difficulties and is otherwise without complaint today.   Past Medical History  Diagnosis Date  . Skin cancer   . CVA (cerebral infarction)   . Afib     s/p ablation x2 by Dr [name] at [facility] in 2007, 2009  . COPD (chronic obstructive pulmonary disease)   . Palpitations   . Atypical atrial flutter   . Hyperlipidemia   . Hypertension   . Alcohol abuse    Past Surgical History  Procedure Laterality Date  . Appendectomy    . Fracture surgery    . Atrial fibrillation ablation  2007 ,2009    Dr [name] at [place] Prescriptions  Medication Sig Dispense Refill  . atorvastatin (LIPITOR) 80 MG tablet Take 1 tablet by mouth daily.      . enalapril (VASOTEC) 20 MG tablet Take 1 tablet by mouth at bedtime.      . ferrous sulfate 325 (65 FE) MG tablet Take 325 mg by mouth daily with breakfast.      . metoprolol (LOPRESSOR) 50 MG tablet Take 1 tablet by mouth 2 (two) times daily.      Marland Kitchen omeprazole  (PRILOSEC) 20 MG capsule Take 1 capsule by mouth daily.      . verapamil (CALAN) 120 MG tablet Take 120 mg by mouth as needed.      [name] 20 MG TABS tablet Take 1 tablet by mouth daily.       No current facility-administered medications for this visit.    No Known Allergies  History   Social History  . Marital Status: Single    Spouse Name: N/A    Number of Children: N/A  . Years of Education: N/A   Occupational History  . Not on file.   Social History Main Topics  . Smoking status: Current Every Day Smoker -- 35 years    Types: Cigarettes  . Smokeless tobacco: Not on file     Comment: Smokes less than 1 ppd  . Alcohol Use: Yes     Comment: heavy, up to 6 beers per day  . Drug Use: No  . Sexual Activity: Not on file   Other Topics Concern  . Not on file   Social History Narrative   Lives in [place].  Works as a Scientist, forensic    Family History  Problem Relation Age of Onset  . Cancer      ROS- All systems are reviewed and negative except as per the HPI above  Physical Exam: Filed Vitals:   01/29/14 1546  BP: 160/81  Pulse: 77  Height: 6' 1.5" (1.867 m)  Weight: 188 lb (85.276 kg)    GEN- The patient is well appearing, alert and oriented x 3 today.   Head- normocephalic, atraumatic Eyes-  Sclera clear, conjunctiva pink Ears- hearing intact Oropharynx- clear Neck- supple, no JVP Lymph- no cervical lymphadenopathy Lungs- Clear to ausculation bilaterally, normal work of breathing Heart- Regular rate and rhythm, no murmurs, rubs or gallops, PMI not laterally displaced GI- soft, NT, ND, + BS Extremities- no clubbing, cyanosis, or edema MS- no significant deformity or atrophy Skin- no rash or lesion Psych- euthymic mood, full affect Neuro- strength and sensation are intact  EKG today reveals sinus rhythm 77 bpm, PR 228, LVH Echo 05/07/13 reveals EF 55%, mild MR, mild PI, LA 39mm Epic records including Dr Ezekiel Slocumb records are reviewed.   Unfortunately the event strips are not available at this time.  Dr Wynonia Lawman describes both atrial flutter and atrial fibrillation.  Assessment and Plan:  1. Atypical atrial flutter The patient has recurrent symptomatic atrial arrhythmias.  He has had 2 prior ablations.  Unfortunatley, he drinks heavily.  I suspect that this is the cause for his ongoing atrial arrhythmias.  I think that our ability to maintain sinus without AAD therapy is low.  He also has symptoms more suggestive of pacs.  I think that at this time it is most prudent to proceed with lifestyle modification including ETOH cessation.  I will also start flecainide 50mg  BID for rhythm control.  He will need to follow-up with Dr Wynonia Lawman for [facility] in the next 2-3 weeks to exclude ischemia. I would defer ablation to arrhythmias refractory to AAD therapy after cessation of ETOH.  2. ETOH Cessation advised as above  3. Tobacco Cessation advised  Follow-up with Dr Wynonia Lawman I will see as needed going forward

## 2014-01-29 NOTE — Patient Instructions (Addendum)
Your physician recommends that you schedule a follow-up appointment as scheduled with Dr Wynonia Lawman and as needed with Dr Rayann Heman   Your physician has recommended you make the following change in your medication:  1) Start Flecainide 50mg  twice Daily  No Alcohol

## 2014-05-28 ENCOUNTER — Telehealth: Payer: Self-pay | Admitting: *Deleted

## 2014-05-28 ENCOUNTER — Encounter: Payer: Self-pay | Admitting: Gastroenterology

## 2014-05-28 ENCOUNTER — Ambulatory Visit (INDEPENDENT_AMBULATORY_CARE_PROVIDER_SITE_OTHER): Payer: Medicare Other | Admitting: Gastroenterology

## 2014-05-28 VITALS — BP 112/68 | HR 68 | Ht 73.5 in | Wt 188.0 lb

## 2014-05-28 DIAGNOSIS — Z860100 Personal history of colon polyps, unspecified: Secondary | ICD-10-CM | POA: Insufficient documentation

## 2014-05-28 DIAGNOSIS — Z7901 Long term (current) use of anticoagulants: Secondary | ICD-10-CM | POA: Insufficient documentation

## 2014-05-28 MED ORDER — MOVIPREP 100 G PO SOLR: 1.0000 | Freq: Once | ORAL | Status: DC

## 2014-05-28 NOTE — Telephone Encounter (Signed)
  05/28/2014   RE: MAJOUR FREI DOB: 1946/05/18 MRN: [id number]   Dear Dr. Rayann Heman,    We have scheduled the above patient for an Colonoscopy. Our records show that he is on anticoagulation therapy.   Please advise as to how long the patient may come off his therapy of Xarelto prior to the procedure, which is scheduled for 06-24-2014.  Please fax back/ or route the completed form to Evette Georges., CMA  Sincerely,    Hope Pigeon

## 2014-05-28 NOTE — Patient Instructions (Signed)
You have been scheduled for a colonoscopy. Please follow written instructions given to you at your visit today.  Please pick up your prep kit at the pharmacy within the next 1-3 days. If you use inhalers (even only as needed), please bring them with you on the day of your procedure. Your physician has requested that you go to www.startemmi.com and enter the access code given to you at your visit today. This web site gives a general overview about your procedure. However, you should still follow specific instructions given to you by our office regarding your preparation for the procedure.  You will be contacted by our office prior to your procedure for directions on holding your Xarelto.  If you do not hear from our office 1 week prior to your scheduled procedure, please call [phone] to discuss.   

## 2014-05-28 NOTE — Progress Notes (Signed)
05/28/2014 Samuel Ferrell [id number] 1946-06-05   HISTORY OF PRESENT ILLNESS:  This is a 68 year old male who is previously known to Dr. Henrene Pastor for colonoscopy in 04/2008.  At that time he had diverticulosis and 4 tubular adenomas that were removed.  It was recommended that he have a repeat colonoscopy in 2 years from that time.  He is here today to discuss that procedure.  He is on Xarelto for atrial fibrillation (had ablation x 2 at [facility] in 2007 and 2009).  He also has PMH of HTN, HLD, COPD, history of CVA.  He is a smoker and drinks approximately 6 beers daily.  Says that he sees occasional bright red blood, which he contributes to hemorrhoids.  No other GI complaints or issues. Appetite is good and weight is stable.  Recent CBC and CMP relatively unremarkable.   Past Medical History  Diagnosis Date  . Skin cancer   . CVA (cerebral infarction)   . Afib     s/p ablation x2 by Dr [name] at [facility] in 2007, 2009  . COPD (chronic obstructive pulmonary disease)   . Palpitations   . Atypical atrial flutter   . Hyperlipidemia   . Hypertension   . Alcohol abuse    Past Surgical History  Procedure Laterality Date  . Appendectomy    . Fracture surgery    . Atrial fibrillation ablation  2007 ,2009    Dr [name] at [facility]    reports that he has been smoking Cigarettes.  He has been smoking about 0.00 packs per day for the past 35 years. He has never used smokeless tobacco. He reports that he drinks alcohol. He reports that he does not use illicit drugs. family history includes Cancer in an other family member. No Known Allergies    Outpatient Encounter Prescriptions as of 05/28/2014  Medication Sig  . atorvastatin (LIPITOR) 80 MG tablet Take 1 tablet by mouth daily.  . enalapril (VASOTEC) 20 MG tablet Take 1 tablet by mouth at bedtime.  . ferrous sulfate 325 (65 FE) MG tablet Take 325 mg by mouth daily with breakfast.  . flecainide (TAMBOCOR) 50 MG tablet Take 1 tablet (50 mg  total) by mouth 2 (two) times daily.  Marland Kitchen FLUZONE HIGH-DOSE 0.5 ML SUSY   . metoprolol (LOPRESSOR) 50 MG tablet Take 1 tablet by mouth 2 (two) times daily.  Marland Kitchen omeprazole (PRILOSEC) 20 MG capsule Take 1 capsule by mouth daily.  . verapamil (CALAN) 120 MG tablet Take 120 mg by mouth as needed.  [name] 20 MG TABS tablet Take 1 tablet by mouth daily.     REVIEW OF SYSTEMS  : All other systems reviewed and negative except where noted in the History of Present Illness.   PHYSICAL EXAM: BP 112/68  Pulse 68  Ht 6' 1.5" (1.867 m)  Wt 188 lb (85.276 kg)  BMI 24.46 kg/m2 General: Well developed white male in no acute distress Head: Normocephalic and atraumatic Eyes:  Sclerae anicteric, conjunctiva pink. Ears: Normal auditory acuity  Lungs: Clear throughout to auscultation Heart: Regular rate and rhythm Abdomen: Soft, non-distended.  Normal bowel sounds.  Non-tender. Rectal:  Deferred.  Will be done at the time of colonoscopy. Musculoskeletal: Symmetrical with no gross deformities  Skin: No lesions on visible extremities Extremities: No edema  Neurological: Alert oriented x 4, grossly non-focal Psychological:  Alert and cooperative. Normal mood and affect  ASSESSMENT AND PLAN: -Personal history of colon polyps:  Adenomatous polyps in  2009.  Will schedule colonoscopy with Dr. Henrene Pastor.  The risks, benefits, and alternatives were discussed with the patient and he consents to proceed.  The risks benefits and alternatives to a temporary hold of anti-coagulants/anti-platelets for the procedure were discussed with the patient he consents to proceed. Obtain clearance from Dr. Rayann Heman for ok to hold Xarelto.

## 2014-05-28 NOTE — Progress Notes (Signed)
Agree with initial assessment and plans 

## 2014-05-28 NOTE — Telephone Encounter (Signed)
Stop xarelto 24 hours prior to the procedure and resume 24 hours afterwards.

## 2014-05-29 NOTE — Telephone Encounter (Signed)
Called patient , advised patient to hold Xarelto 24 hrs prior to procedure per Dr. Rayann Heman. Patient verbalized understanding.

## 2014-06-03 ENCOUNTER — Encounter: Payer: Self-pay | Admitting: Internal Medicine

## 2014-06-24 ENCOUNTER — Ambulatory Visit (AMBULATORY_SURGERY_CENTER): Payer: Medicare Other | Admitting: Internal Medicine

## 2014-06-24 ENCOUNTER — Encounter: Payer: Self-pay | Admitting: Internal Medicine

## 2014-06-24 VITALS — BP 142/78 | HR 66 | Temp 97.0°F | Resp 16 | Ht 73.5 in | Wt 188.0 lb

## 2014-06-24 DIAGNOSIS — D122 Benign neoplasm of ascending colon: Secondary | ICD-10-CM

## 2014-06-24 DIAGNOSIS — Z86010 Personal history of colonic polyps: Secondary | ICD-10-CM

## 2014-06-24 MED ORDER — SODIUM CHLORIDE 0.9 % IV SOLN: 500.0000 mL | INTRAVENOUS | Status: DC

## 2014-06-24 NOTE — Progress Notes (Signed)
Called to room to assist during endoscopic procedure.  Patient ID and intended procedure confirmed with present staff. Received instructions for my participation in the procedure from the performing physician.  

## 2014-06-24 NOTE — Progress Notes (Signed)
A/ox3 pleased with MAC, report to Karen RN 

## 2014-06-24 NOTE — Op Note (Signed)
[place]  [address] [place], [address]   COLONOSCOPY PROCEDURE REPORT  PATIENT: Samuel, Ferrell  MR#: [id number] BIRTHDATE: [birth year] , 17  yrs. old GENDER: male ENDOSCOPIST: Eustace Quail, MD REFERRED XE:NMMHWKG Tisovec, M.D. PROCEDURE DATE:  06/24/2014 PROCEDURE:   Colonoscopy with snare polypectomy x 1 First Screening Colonoscopy - Avg.  risk and is 50 yrs.  old or older - No.  Prior Negative Screening - Now for repeat screening. N/A  History of Adenoma - Now for follow-up colonoscopy & has been > or = to 3 yrs.  Yes hx of adenoma.  Has been 3 or more years since last colonoscopy.  Polyps Removed Today? Yes. ASA CLASS:   Class III INDICATIONS:surveillance colonoscopy based on a history of adenomatous colonic polyp(s).x1.index exam March 2009 with large tubular adenoma removed piecemeal. Followup examination August 2009 with clean polypectomy site, but multiple small adenomas. Followup in 2 years recommended. Overdue for followup. MEDICATIONS: Monitored anesthesia care and Propofol 300 mg IV  DESCRIPTION OF PROCEDURE:   After the risks benefits and alternatives of the procedure were thoroughly explained, informed consent was obtained.  The digital rectal exam revealed no abnormalities of the rectum.   The LB [id number]  endoscope was introduced through the anus and advanced to the cecum, which was identified by both the appendix and ileocecal valve. No adverse events experienced.   The quality of the prep was good, using MoviPrep  The instrument was then slowly withdrawn as the colon was fully examined.   COLON FINDINGS: A single polyp measuring 5 mm in size was found in the ascending colon.   There was moderate diverticulosis noted in the left colon.   The examination was otherwise normal. Retroflexed views revealed no abnormalities. The time to cecum=1 minutes 49 seconds.  Withdrawal time=13 minutes 33 seconds.  The scope was withdrawn and  the procedure completed. COMPLICATIONS: There were no immediate complications.  ENDOSCOPIC IMPRESSION: 1.   Single polyp measuring 5 mm in size was found in the ascending colon 2.   Moderate diverticulosis was noted in the left colon 3.   The examination was otherwise normal  RECOMMENDATIONS: 1.  Follow up colonoscopy in 5 years (personal history of advanced neoplasia) 2.  Resume Xarelto today  eSigned:  Eustace Quail, MD 06/24/2014 3:31 PM   cc: Domenick Gong, MD and The Patient

## 2014-06-24 NOTE — Patient Instructions (Signed)
RESTART YOUR XARELTO TODAY.   YOU HAD AN ENDOSCOPIC PROCEDURE TODAY AT [facility]: Refer to the procedure report that was given to you for any specific questions about what was found during the examination.  If the procedure report does not answer your questions, please call your gastroenterologist to clarify.  If you requested that your care partner not be given the details of your procedure findings, then the procedure report has been included in a sealed envelope for you to review at your convenience later.  YOU SHOULD EXPECT: Some feelings of bloating in the abdomen. Passage of more gas than usual.  Walking can help get rid of the air that was put into your GI tract during the procedure and reduce the bloating. If you had a lower endoscopy (such as a colonoscopy or flexible sigmoidoscopy) you may notice spotting of blood in your stool or on the toilet paper. If you underwent a bowel prep for your procedure, then you may not have a normal bowel movement for a few days.  DIET: Your first meal following the procedure should be a light meal and then it is ok to progress to your normal diet.  A half-sandwich or bowl of soup is an example of a good first meal.  Heavy or fried foods are harder to digest and may make you feel nauseous or bloated.  Likewise meals heavy in dairy and vegetables can cause extra gas to form and this can also increase the bloating.  Drink plenty of fluids but you should avoid alcoholic beverages for 24 hours.  ACTIVITY: Your care partner should take you home directly after the procedure.  You should plan to take it easy, moving slowly for the rest of the day.  You can resume normal activity the day after the procedure however you should NOT DRIVE or use heavy machinery for 24 hours (because of the sedation medicines used during the test).    SYMPTOMS TO REPORT IMMEDIATELY: A gastroenterologist can be reached at any hour.  During normal business hours, 8:30 AM  to 5:00 PM Monday through Friday, call [phone].  After hours and on weekends, please call the GI answering service at [phone] who will take a message and have the physician on call contact you.   Following lower endoscopy (colonoscopy or flexible sigmoidoscopy):  Excessive amounts of blood in the stool  Significant tenderness or worsening of abdominal pains  Swelling of the abdomen that is new, acute  Fever of 100F or higher  FOLLOW UP: If any biopsies were taken you will be contacted by phone or by letter within the next 1-3 weeks.  Call your gastroenterologist if you have not heard about the biopsies in 3 weeks.  Our staff will call the home number listed on your records the next business day following your procedure to check on you and address any questions or concerns that you may have at that time regarding the information given to you following your procedure. This is a courtesy call and so if there is no answer at the home number and we have not heard from you through the emergency physician on call, we will assume that you have returned to your regular daily activities without incident.  SIGNATURES/CONFIDENTIALITY: You and/or your care partner have signed paperwork which will be entered into your electronic medical record.  These signatures attest to the fact that that the information above on your After Visit Summary has been reviewed and is understood.  Full  responsibility of the confidentiality of this discharge information lies with you and/or your care-partner.

## 2014-06-25 ENCOUNTER — Telehealth: Payer: Self-pay | Admitting: *Deleted

## 2014-06-25 NOTE — Telephone Encounter (Signed)
Message left

## 2014-06-29 ENCOUNTER — Encounter: Payer: Self-pay | Admitting: Internal Medicine

## 2015-01-22 ENCOUNTER — Other Ambulatory Visit: Payer: Self-pay | Admitting: Internal Medicine

## 2015-03-05 ENCOUNTER — Encounter: Payer: Self-pay | Admitting: Internal Medicine

## 2015-07-21 ENCOUNTER — Other Ambulatory Visit: Payer: Self-pay | Admitting: Internal Medicine

## 2015-07-22 NOTE — Telephone Encounter (Signed)
Should this be deferred to patients primary cardiologist, Dr Wynonia Lawman as he is prn follow up with Dr Rayann Heman? Please advise. Thanks, MI

## 2015-07-22 NOTE — Telephone Encounter (Signed)
Yes please

## 2015-07-23 ENCOUNTER — Other Ambulatory Visit: Payer: Self-pay | Admitting: *Deleted

## 2015-07-23 MED ORDER — FLECAINIDE ACETATE 50 MG PO TABS: 50.0000 mg | ORAL_TABLET | Freq: Two times a day (BID) | ORAL | Status: DC

## 2015-10-19 ENCOUNTER — Other Ambulatory Visit: Payer: Self-pay | Admitting: Internal Medicine

## 2018-06-25 ENCOUNTER — Observation Stay (HOSPITAL_COMMUNITY)
Admission: EM | Admit: 2018-06-25 | Discharge: 2018-06-26 | Disposition: A | Discharge status: Home / Self Care | Payer: Medicare Other | Attending: Oncology | Admitting: Oncology

## 2018-06-25 ENCOUNTER — Emergency Department (HOSPITAL_COMMUNITY): Payer: Medicare Other

## 2018-06-25 ENCOUNTER — Other Ambulatory Visit: Payer: Self-pay

## 2018-06-25 ENCOUNTER — Observation Stay (HOSPITAL_COMMUNITY): Payer: Medicare Other

## 2018-06-25 ENCOUNTER — Encounter (HOSPITAL_COMMUNITY): Payer: Self-pay | Admitting: Neurology

## 2018-06-25 ENCOUNTER — Observation Stay (HOSPITAL_BASED_OUTPATIENT_CLINIC_OR_DEPARTMENT_OTHER): Payer: Medicare Other

## 2018-06-25 DIAGNOSIS — N400 Enlarged prostate without lower urinary tract symptoms: Secondary | ICD-10-CM | POA: Diagnosis not present

## 2018-06-25 DIAGNOSIS — F17210 Nicotine dependence, cigarettes, uncomplicated: Secondary | ICD-10-CM

## 2018-06-25 DIAGNOSIS — R2990 Unspecified symptoms and signs involving the nervous system: Secondary | ICD-10-CM | POA: Diagnosis not present

## 2018-06-25 DIAGNOSIS — I10 Essential (primary) hypertension: Secondary | ICD-10-CM | POA: Diagnosis not present

## 2018-06-25 DIAGNOSIS — E785 Hyperlipidemia, unspecified: Secondary | ICD-10-CM | POA: Diagnosis not present

## 2018-06-25 DIAGNOSIS — I63231 Cerebral infarction due to unspecified occlusion or stenosis of right carotid arteries: Secondary | ICD-10-CM

## 2018-06-25 DIAGNOSIS — Z8673 Personal history of transient ischemic attack (TIA), and cerebral infarction without residual deficits: Secondary | ICD-10-CM | POA: Diagnosis not present

## 2018-06-25 DIAGNOSIS — M50122 Cervical disc disorder at C5-C6 level with radiculopathy: Secondary | ICD-10-CM | POA: Diagnosis not present

## 2018-06-25 DIAGNOSIS — I484 Atypical atrial flutter: Secondary | ICD-10-CM | POA: Insufficient documentation

## 2018-06-25 DIAGNOSIS — Z79899 Other long term (current) drug therapy: Secondary | ICD-10-CM | POA: Diagnosis not present

## 2018-06-25 DIAGNOSIS — R740 Nonspecific elevation of levels of transaminase and lactic acid dehydrogenase [LDH]: Secondary | ICD-10-CM | POA: Diagnosis not present

## 2018-06-25 DIAGNOSIS — I6521 Occlusion and stenosis of right carotid artery: Secondary | ICD-10-CM | POA: Insufficient documentation

## 2018-06-25 DIAGNOSIS — J449 Chronic obstructive pulmonary disease, unspecified: Secondary | ICD-10-CM | POA: Diagnosis not present

## 2018-06-25 DIAGNOSIS — M4722 Other spondylosis with radiculopathy, cervical region: Secondary | ICD-10-CM | POA: Insufficient documentation

## 2018-06-25 DIAGNOSIS — I361 Nonrheumatic tricuspid (valve) insufficiency: Secondary | ICD-10-CM | POA: Diagnosis not present

## 2018-06-25 DIAGNOSIS — M4712 Other spondylosis with myelopathy, cervical region: Secondary | ICD-10-CM | POA: Diagnosis not present

## 2018-06-25 DIAGNOSIS — I482 Chronic atrial fibrillation, unspecified: Secondary | ICD-10-CM

## 2018-06-25 DIAGNOSIS — M4800 Spinal stenosis, site unspecified: Secondary | ICD-10-CM

## 2018-06-25 DIAGNOSIS — D7589 Other specified diseases of blood and blood-forming organs: Secondary | ICD-10-CM

## 2018-06-25 DIAGNOSIS — I480 Paroxysmal atrial fibrillation: Secondary | ICD-10-CM | POA: Diagnosis not present

## 2018-06-25 DIAGNOSIS — R29810 Facial weakness: Secondary | ICD-10-CM | POA: Diagnosis not present

## 2018-06-25 DIAGNOSIS — M50221 Other cervical disc displacement at C4-C5 level: Secondary | ICD-10-CM

## 2018-06-25 DIAGNOSIS — F1010 Alcohol abuse, uncomplicated: Secondary | ICD-10-CM | POA: Diagnosis not present

## 2018-06-25 DIAGNOSIS — I440 Atrioventricular block, first degree: Secondary | ICD-10-CM

## 2018-06-25 DIAGNOSIS — G459 Transient cerebral ischemic attack, unspecified: Secondary | ICD-10-CM

## 2018-06-25 DIAGNOSIS — G8321 Monoplegia of upper limb affecting right dominant side: Secondary | ICD-10-CM | POA: Diagnosis not present

## 2018-06-25 DIAGNOSIS — M50022 Cervical disc disorder at C5-C6 level with myelopathy: Secondary | ICD-10-CM | POA: Diagnosis not present

## 2018-06-25 DIAGNOSIS — Z7901 Long term (current) use of anticoagulants: Secondary | ICD-10-CM | POA: Insufficient documentation

## 2018-06-25 DIAGNOSIS — M5021 Other cervical disc displacement,  high cervical region: Secondary | ICD-10-CM

## 2018-06-25 DIAGNOSIS — I639 Cerebral infarction, unspecified: Secondary | ICD-10-CM | POA: Diagnosis present

## 2018-06-25 LAB — COMPREHENSIVE METABOLIC PANEL
ALBUMIN: 4.1 g/dL (ref 3.5–5.0)
ALK PHOS: 78 U/L (ref 38–126)
ALT: 65 U/L — ABNORMAL HIGH (ref 0–44)
ANION GAP: 11 (ref 5–15)
AST: 46 U/L — ABNORMAL HIGH (ref 15–41)
BUN: 9 mg/dL (ref 8–23)
CO2: 26 mmol/L (ref 22–32)
CREATININE: 1.06 mg/dL (ref 0.61–1.24)
Calcium: 9.8 mg/dL (ref 8.9–10.3)
Chloride: 102 mmol/L (ref 98–111)
GFR calc non Af Amer: >60 mL/min (ref >60)
Glucose, Bld: 106 mg/dL — ABNORMAL HIGH (ref 70–99)
POTASSIUM: 3.6 mmol/L (ref 3.5–5.1)
SODIUM: 139 mmol/L (ref 135–145)
Total Bilirubin: 0.8 mg/dL (ref 0.3–1.2)
Total Protein: 7.6 g/dL (ref 6.5–8.1)

## 2018-06-25 LAB — ECHOCARDIOGRAM COMPLETE
HEIGHTINCHES: 74 in
WEIGHTICAEL: 2944 [oz_av]

## 2018-06-25 LAB — DIFFERENTIAL
Abs Immature Granulocytes: 0.02 10*3/uL (ref 0.00–0.07)
BASOS ABS: 0 10*3/uL (ref 0.0–0.1)
Basophils Relative: 1 %
EOS ABS: 0.1 10*3/uL (ref 0.0–0.5)
EOS PCT: 2 %
IMMATURE GRANULOCYTES: 0 %
Lymphocytes Relative: 23 %
Lymphs Abs: 1.2 10*3/uL (ref 0.7–4.0)
MONO ABS: 0.6 10*3/uL (ref 0.1–1.0)
Monocytes Relative: 11 %
NEUTROS PCT: 63 %
Neutro Abs: 3 10*3/uL (ref 1.7–7.7)

## 2018-06-25 LAB — CBC
HCT: 46.2 % (ref 39.0–52.0)
Hemoglobin: 15.1 g/dL (ref 13.0–17.0)
MCH: 32.8 pg (ref 26.0–34.0)
MCHC: 32.7 g/dL (ref 30.0–36.0)
MCV: 100.2 fL — ABNORMAL HIGH (ref 80.0–100.0)
Platelets: 191 K/uL (ref 150–400)
RBC: 4.61 MIL/uL (ref 4.22–5.81)
RDW: 13.4 % (ref 11.5–15.5)
WBC: 4.9 K/uL (ref 4.0–10.5)
nRBC: 0 % (ref 0.0–0.2)

## 2018-06-25 LAB — URINALYSIS, ROUTINE W REFLEX MICROSCOPIC
BILIRUBIN URINE: NEGATIVE
GLUCOSE, UA: NEGATIVE mg/dL
Hgb urine dipstick: NEGATIVE
Ketones, ur: NEGATIVE mg/dL
Leukocytes, UA: NEGATIVE
Nitrite: NEGATIVE
PH: 5 (ref 5.0–8.0)
Protein, ur: NEGATIVE mg/dL
SPECIFIC GRAVITY, URINE: 1.009 (ref 1.005–1.030)

## 2018-06-25 LAB — RAPID URINE DRUG SCREEN, HOSP PERFORMED
Amphetamines: NOT DETECTED
Barbiturates: NOT DETECTED
Benzodiazepines: NOT DETECTED
Cocaine: NOT DETECTED
Opiates: NOT DETECTED
Tetrahydrocannabinol: NOT DETECTED

## 2018-06-25 LAB — I-STAT CHEM 8, ED
BUN: 12 mg/dL (ref 8–23)
Calcium, Ion: 0.87 mmol/L — CL (ref 1.15–1.40)
Chloride: 109 mmol/L (ref 98–111)
Creatinine, Ser: 0.9 mg/dL (ref 0.61–1.24)
Glucose, Bld: 112 mg/dL — ABNORMAL HIGH (ref 70–99)
HCT: 45 % (ref 39.0–52.0)
Hemoglobin: 15.3 g/dL (ref 13.0–17.0)
Potassium: 4.4 mmol/L (ref 3.5–5.1)
Sodium: 136 mmol/L (ref 135–145)
TCO2: 24 mmol/L (ref 22–32)

## 2018-06-25 LAB — PROTIME-INR
INR: 1.07
Prothrombin Time: 13.8 s (ref 11.4–15.2)

## 2018-06-25 LAB — I-STAT TROPONIN, ED: TROPONIN I, POC: 0 ng/mL (ref 0.00–0.08)

## 2018-06-25 LAB — CBG MONITORING, ED: Glucose-Capillary: 108 mg/dL — ABNORMAL HIGH (ref 70–99)

## 2018-06-25 LAB — ETHANOL

## 2018-06-25 LAB — APTT: APTT: 32 s (ref 24–36)

## 2018-06-25 MED ORDER — FINASTERIDE 5 MG PO TABS
5.0000 mg | ORAL_TABLET | Freq: Every day | ORAL | Status: DC
  Administered 2018-06-25: 5 mg via ORAL
  Filled 2018-06-25: qty 1

## 2018-06-25 MED ORDER — THIAMINE HCL 100 MG/ML IJ SOLN
100.0000 mg | Freq: Every day | INTRAMUSCULAR | Status: DC
  Administered 2018-06-25: 100 mg via INTRAVENOUS
  Filled 2018-06-25: qty 2

## 2018-06-25 MED ORDER — FLECAINIDE ACETATE 50 MG PO TABS
50.0000 mg | ORAL_TABLET | Freq: Two times a day (BID) | ORAL | Status: DC
  Administered 2018-06-25 (×2): 50 mg via ORAL
  Filled 2018-06-25 (×4): qty 1

## 2018-06-25 MED ORDER — STROKE: EARLY STAGES OF RECOVERY BOOK
Freq: Once | Status: DC
  Filled 2018-06-25: qty 1

## 2018-06-25 MED ORDER — LORAZEPAM 2 MG/ML IJ SOLN: 0.0000 mg | Freq: Two times a day (BID) | INTRAMUSCULAR | Status: DC

## 2018-06-25 MED ORDER — LORAZEPAM 2 MG/ML IJ SOLN: 0.0000 mg | Freq: Four times a day (QID) | INTRAMUSCULAR | Status: DC

## 2018-06-25 MED ORDER — ATORVASTATIN CALCIUM 80 MG PO TABS
80.0000 mg | ORAL_TABLET | Freq: Every day | ORAL | Status: DC
  Administered 2018-06-25 – 2018-06-26 (×2): 80 mg via ORAL
  Filled 2018-06-25: qty 4
  Filled 2018-06-25: qty 1

## 2018-06-25 MED ORDER — SODIUM CHLORIDE 0.9% FLUSH
3.0000 mL | Freq: Two times a day (BID) | INTRAVENOUS | Status: DC
  Administered 2018-06-25 (×2): 3 mL via INTRAVENOUS

## 2018-06-25 MED ORDER — POLYETHYLENE GLYCOL 3350 17 G PO PACK: 17.0000 g | PACK | Freq: Every day | ORAL | Status: DC | PRN

## 2018-06-25 MED ORDER — RIVAROXABAN 20 MG PO TABS
20.0000 mg | ORAL_TABLET | Freq: Every day | ORAL | Status: DC
  Administered 2018-06-25: 20 mg via ORAL
  Filled 2018-06-25 (×2): qty 1

## 2018-06-25 MED ORDER — GADOBUTROL 1 MMOL/ML IV SOLN
9.0000 mL | Freq: Once | INTRAVENOUS | Status: AC | PRN
  Administered 2018-06-25: 9 mL via INTRAVENOUS

## 2018-06-25 MED ORDER — LORAZEPAM 1 MG PO TABS: 0.0000 mg | ORAL_TABLET | Freq: Two times a day (BID) | ORAL | Status: DC

## 2018-06-25 MED ORDER — ACETAMINOPHEN 650 MG RE SUPP: 650.0000 mg | Freq: Four times a day (QID) | RECTAL | Status: DC | PRN

## 2018-06-25 MED ORDER — ASPIRIN EC 81 MG PO TBEC
81.0000 mg | DELAYED_RELEASE_TABLET | Freq: Every day | ORAL | Status: DC
  Administered 2018-06-25 – 2018-06-26 (×2): 81 mg via ORAL
  Filled 2018-06-25 (×2): qty 1

## 2018-06-25 MED ORDER — VITAMIN B-1 100 MG PO TABS
100.0000 mg | ORAL_TABLET | Freq: Every day | ORAL | Status: DC
  Administered 2018-06-26: 100 mg via ORAL
  Filled 2018-06-25: qty 1

## 2018-06-25 MED ORDER — LORAZEPAM 1 MG PO TABS
0.0000 mg | ORAL_TABLET | Freq: Four times a day (QID) | ORAL | Status: DC
  Filled 2018-06-25: qty 2

## 2018-06-25 MED ORDER — ACETAMINOPHEN 325 MG PO TABS
650.0000 mg | ORAL_TABLET | Freq: Four times a day (QID) | ORAL | Status: DC | PRN
  Administered 2018-06-25: 650 mg via ORAL
  Filled 2018-06-25: qty 2

## 2018-06-25 MED ORDER — PANTOPRAZOLE SODIUM 40 MG PO TBEC
40.0000 mg | DELAYED_RELEASE_TABLET | Freq: Every day | ORAL | Status: DC
  Administered 2018-06-25: 40 mg via ORAL
  Filled 2018-06-25 (×2): qty 1

## 2018-06-25 MED ORDER — METOPROLOL TARTRATE 50 MG PO TABS
50.0000 mg | ORAL_TABLET | Freq: Two times a day (BID) | ORAL | Status: DC
  Administered 2018-06-25 – 2018-06-26 (×3): 50 mg via ORAL
  Filled 2018-06-25 (×3): qty 1

## 2018-06-25 NOTE — Progress Notes (Signed)
  Echocardiogram 2D Echocardiogram has been performed.  Samuel Ferrell 06/25/2018, 4:55 PM

## 2018-06-25 NOTE — ED Notes (Signed)
MD ward made aware of this patient.

## 2018-06-25 NOTE — ED Notes (Signed)
Patient transported to MRI 

## 2018-06-25 NOTE — Consult Note (Addendum)
Neurology Consultation  Reason for Consult: Stroke Referring Physician: Ashok Cordia, K  CC: Right-sided decreased sensation  History is obtained from:   HPI: Samuel Ferrell is a 72 y.o. male with history of tobacco abuse, alcohol abuse, atrial fibrillation who had an ablation and is on Xarelto, CVA in the past with no residual effects of hyperlipidemia. On 06/23/2018 when he woke up in the morning he noted some decreased sensation on the back of his neck. Then on 06/24/2018, in the afternoon, he noted that his right arm and leg also had decreased sensation.  He states that he had no decreased strength at that time but upon waking this morning he noted that he had decreased fine motor skills. He endorsed right sided weakness as well.  For that reason he came to the emergency department to be evaluated.  He does state that he smokes a pack a day, and drinks 6 beers a day.  He states that he is compliant with his Xarelto.   LKW: 06/23/2018 tpa given?: no, on Xarelto in addition to being out of the window Premorbid modified Rankin scale (mRS): 0 NIH stroke scale is 4  ROS: A 14 point ROS was performed and is negative except as noted in the HPI.   Past Medical History:  Diagnosis Date  . Afib [facility])    s/p ablation x2 by Dr [name] at [facility] in 2007, 2009  . Alcohol abuse   . Atypical atrial flutter ([place])   . COPD (chronic obstructive pulmonary disease) ([place])   . CVA (cerebral infarction)   . Hyperlipidemia   . Hypertension   . Palpitations   . Skin cancer--melanoma   . Stroke [facility])    TIA    Family History  Problem Relation Age of Onset  . Cancer Unknown   . Colon cancer Neg Hx   . Esophageal cancer Neg Hx   . Rectal cancer Neg Hx   . Stomach cancer Neg Hx      Social History:   reports that he has been smoking cigarettes. He has a 35.00 pack-year smoking history. He has never used smokeless tobacco. He reports that he drinks alcohol. He reports that he does not use  drugs.  Medications  Current Facility-Administered Medications:  .  LORazepam (ATIVAN) injection 0-4 mg, 0-4 mg, Intravenous, Q6H **OR** LORazepam (ATIVAN) tablet 0-4 mg, 0-4 mg, Oral, Q6H, Joy, Shawn C, PA-C .  [START ON 06/27/2018] LORazepam (ATIVAN) injection 0-4 mg, 0-4 mg, Intravenous, Q12H **OR** [START ON 06/27/2018] LORazepam (ATIVAN) tablet 0-4 mg, 0-4 mg, Oral, Q12H, Joy, Shawn C, PA-C .  thiamine (VITAMIN B-1) tablet 100 mg, 100 mg, Oral, Daily **OR** thiamine (B-1) injection 100 mg, 100 mg, Intravenous, Daily, Joy, Shawn C, PA-C  Current Outpatient Medications:  .  atorvastatin (LIPITOR) 80 MG tablet, Take 1 tablet by mouth daily., Disp: , Rfl:  .  enalapril (VASOTEC) 20 MG tablet, Take 1 tablet by mouth at bedtime., Disp: , Rfl:  .  ferrous sulfate 325 (65 FE) MG tablet, Take 325 mg by mouth daily with breakfast., Disp: , Rfl:  .  flecainide (TAMBOCOR) 50 MG tablet, Take 1 tablet (50 mg total) by mouth 2 (two) times daily., Disp: 180 tablet, Rfl: 0 .  metoprolol (LOPRESSOR) 50 MG tablet, Take 1 tablet by mouth 2 (two) times daily., Disp: , Rfl:  .  omeprazole (PRILOSEC) 20 MG capsule, Take 1 capsule by mouth daily., Disp: , Rfl:  .  verapamil (CALAN) 120 MG tablet, Take 120  mg by mouth as needed., Disp: , Rfl:  .  XARELTO 20 MG TABS tablet, Take 1 tablet by mouth daily., Disp: , Rfl:    Exam: Current vital signs: BP (!) 111/93   Pulse 63   Temp (!) 97.3 F (36.3 C) (Oral)   Resp 19   Ht 6\' 2"  (1.88 m)   Wt 83.5 kg   SpO2 97%   BMI 23.62 kg/m  Vital signs in last 24 hours: Temp:  [97.3 F (36.3 C)] 97.3 F (36.3 C) (10/15 0646) Pulse Rate:  [63-78] 63 (10/15 0815) Resp:  [14-24] 19 (10/15 0815) BP: (111-153)/(63-93) 111/93 (10/15 0815) SpO2:  [96 %-98 %] 97 % (10/15 0815) Weight:  [83.5 kg] 83.5 kg (10/15 7169)  Physical Exam  Constitutional: Appears well-developed and well-nourished.  Psych: Affect appropriate to situation Eyes: No scleral injection HENT: No  OP obstrucion Head: Normocephalic.  Cardiovascular: Normal rate and regular rhythm.  Respiratory: Effort normal, non-labored breathing GI: Soft.  No distension. There is no tenderness.  Skin: WDI  Neuro: Mental Status: Patient is awake, alert, oriented to person, place, month, year, and situation. Patient is able to give a clear and coherent history. No signs of aphasia or neglect Cranial Nerves: II: Visual Fields are full. Pupils are equal, round, and reactive to light.   III,IV, VI: EOMI without ptosis or diplopia.  V: Facial sensation decreased on the right VII: Facial movement is symmetric.  Patient has a slight facial droop on the right at rest  VIII: hearing is intact to voice X: Uvula elevates symmetrically XI: Shoulder shrug is symmetric. XII: tongue is midline without atrophy or fasciculations.  Motor: Tone and bulk are normal. 5/5 strength was present in all four extremities.  Sensory: Decreased sensation in both the arm and leg Deep Tendon Reflexes: 2+ and symmetric in the biceps and patellae.  Plantars: Toes are downgoing bilaterally.  Cerebellar: On the right FTN is with severe dysmetria, left FTN does have an action tremor but no dysmetria, heel-to-shin bilaterally was normal  Labs I have reviewed labs in epic and the results pertinent to this consultation are:   CBC    Component Value Date/Time   WBC 4.9 06/25/2018 0701   RBC 4.61 06/25/2018 0701   HGB 15.3 06/25/2018 0708   HCT 45.0 06/25/2018 0708   PLT 191 06/25/2018 0701   MCV 100.2 (H) 06/25/2018 0701   MCH 32.8 06/25/2018 0701   MCHC 32.7 06/25/2018 0701   RDW 13.4 06/25/2018 0701   LYMPHSABS 1.2 06/25/2018 0701   MONOABS 0.6 06/25/2018 0701   EOSABS 0.1 06/25/2018 0701   BASOSABS 0.0 06/25/2018 0701    CMP     Component Value Date/Time   NA 136 06/25/2018 0708   K 4.4 06/25/2018 0708   CL 109 06/25/2018 0708   CO2 26 06/25/2018 0701   GLUCOSE 112 (H) 06/25/2018 0708   BUN 12 06/25/2018  0708   CREATININE 0.90 06/25/2018 0708   CALCIUM 9.8 06/25/2018 0701   PROT 7.6 06/25/2018 0701   ALBUMIN 4.1 06/25/2018 0701   AST 46 (H) 06/25/2018 0701   ALT 65 (H) 06/25/2018 0701   ALKPHOS 78 06/25/2018 0701   BILITOT 0.8 06/25/2018 0701   GFRNONAA >60 06/25/2018 0701   GFRAA >60 06/25/2018 0701    Lipid Panel  No results found for: CHOL, TRIG, HDL, CHOLHDL, VLDL, LDLCALC, LDLDIRECT   Imaging MRI brain, MRA head and neck:  1. Negative for acute infarct. T2 shine through in  the white matter bilaterally. Moderate atrophy. 2. Sinus mucosal disease. 3. Negative MRA head 4. 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 5. Otherwise negative MRA neck  Etta Quill PA-C [facility] [phone] 06/25/2018, 8:48 AM    Assessment:  72 year old male with history significant for multiple stroke risk factors, presenting with 2-day history of right sided numbness and decreased fine motor skills in the upper extremity.   1. Despite his history of symptoms, MRI is negative for stroke, with no significant stenosis on MRA of head. 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 Patient is on Xarelto for atrial fibrillation status post ablation procedure in the past. He is currently in normal sinus rhythm. 3. Right sided symptoms, having progressed over two days, may also represent a cervical spinal cord lesion.  4. History of tobacco and alcohol abuse.  Recommendations: 1. He has been admitted for observation.  2. Obtaining MRI of neck   3. Continue Xarelto.   Addendum:  MRI cervical spine completed. Per the official report there is, at C5-6, spondylosis with a central disc herniation with caudal migration of disc material dorsal to the upper C6 vertebral body and canal stenosis with AP diameter of 8.2 mm. Some cord deformity is associated with this, possibly with early abnormal T2 signal within the cord. Bilateral  foraminal stenosis could compress either C6 nerve. Other findings also noted, but appear less likely to be clinically significant. Would consider outpatient Neurosurgery evaluation to assess possible indication for discectomy.   I have seen and examined the patient. I have amended the examination, assessment and recommendations above.  Electronically signed: Dr. Kerney Elbe

## 2018-06-25 NOTE — ED Provider Notes (Signed)
[facility] EMERGENCY DEPARTMENT Provider Note   CSN: [id number] Arrival date & time: 06/25/18  1950     History   Chief Complaint Chief Complaint  Patient presents with  . Numbness    HPI Samuel Ferrell is a 72 y.o. male.  HPI   Samuel Ferrell is a 72 y.o. male, with a history of A. fib, alcohol abuse, COPD, TIA, HTN, hyperlipidemia, presenting to the ED with tingling and weakness to the right side of his body beginning around 4 AM yesterday.  States the tingling started in the right posterior neck and progressed to the right side of the head, but clarifies he does not have a headache.  He had episodes of feeling unsteady on his feet yesterday that resolved.  He woke up this morning around 4 AM with progressed symptoms of right-sided upper and lower extremity weakness and difficulty with coordination, especially in the right upper extremity. Patient drinks about 6 beers daily.  Last alcohol around 6 PM last night.  Denies illicit drug use. Denies fever/chills, vision loss, falls/trauma, chest pain, shortness of breath, abdominal pain, N/V/D, or any other complaints.     Past Medical History:  Diagnosis Date  . Afib [facility])    s/p ablation x2 by Dr [name] at [facility] in 2007, 2009  . Alcohol abuse   . 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 COPD (chronic obstructive pulmonary disease) ([place])   . CVA (cerebral infarction)   . Hyperlipidemia   . Hypertension   . Palpitations   . Skin cancer--melanoma   . Stroke [facility])    TIA    Patient Active Problem List   Diagnosis Date Noted  . Personal history of colonic polyps 05/28/2014  . Chronic anticoagulation 05/28/2014  . 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 Alcohol abuse 01/29/2014  . Tobacco abuse 01/29/2014    Past Surgical History:  Procedure Laterality Date  . APPENDECTOMY    . ATRIAL FIBRILLATION ABLATION  2007 ,2009   Dr [name] at [place] [facility]      Prior to Admission medications   Medication Sig Start Date End Date Taking? Authorizing Provider  apixaban (ELIQUIS) 5 MG TABS tablet Take 5 mg by mouth 2 (two) times daily.   Yes [provider]  atorvastatin (LIPITOR) 80 MG tablet Take 1 tablet by mouth daily. 01/26/14  Yes [provider]  enalapril (VASOTEC) 20 MG tablet Take 20 mg by mouth at bedtime.  12/01/13  Yes [provider]  ferrous sulfate 325 (65 FE) MG tablet Take 325 mg by mouth daily with breakfast.   Yes [provider]  finasteride (PROSCAR) 5 MG tablet Take 5 mg by mouth daily at 12 noon. 04/19/18  Yes [provider]  flecainide (TAMBOCOR) 50 MG tablet Take 1 tablet (50 mg total) by mouth 2 (two) times daily. 07/23/15  Yes Allred, Jeneen Rinks, MD  metoprolol (LOPRESSOR) 50 MG tablet Take 50 mg by mouth 2 (two) times daily.  12/01/13  Yes [provider]  omeprazole (PRILOSEC) 20 MG capsule Take 20 mg by mouth daily.  12/01/13  Yes [provider]  verapamil (CALAN) 120 MG tablet Take 120 mg by mouth as needed.    Yes [provider]  XARELTO 20 MG TABS tablet Take 1 tablet by mouth daily. 01/26/14   [provider]    Family History Family History  Problem Relation  Age of Onset  . Cancer Unknown   . Colon cancer Neg Hx   . Esophageal cancer Neg Hx   . Rectal cancer Neg Hx   . Stomach cancer Neg Hx     Social History Social History   Tobacco Use  . Smoking status: Current Every Day Smoker    Packs/day: 1.00    Years: 35.00    Pack years: 35.00    Types: Cigarettes  . Smokeless tobacco: Never Used  . Tobacco comment: Smokes less than 1 ppd  Substance Use Topics  . Alcohol use: Yes    Comment: heavy, up to 6 beers per day  . Drug use: No     Allergies   Patient has no known allergies.   Review of Systems Review of Systems  Constitutional: Negative for chills and fever.  Eyes: Negative for visual disturbance.  Respiratory:  Negative for shortness of breath.   Cardiovascular: Negative for chest pain.  Gastrointestinal: Negative for abdominal pain, diarrhea, nausea and vomiting.  Musculoskeletal: Negative for neck pain and neck stiffness.  Neurological: Positive for weakness. Negative for syncope, light-headedness and headaches.       Tingling in the right side of the head as well as the right upper and lower extremity.  Instability on his feet.  All other systems reviewed and are negative.    Physical Exam Updated Vital Signs BP (!) 153/70   Pulse 78   Temp (!) 97.3 F (36.3 C) (Oral)   Resp 14   SpO2 98%   Physical Exam  Constitutional: He is oriented to person, place, and time. He appears well-developed and well-nourished. No distress.  HENT:  Head: Normocephalic and atraumatic.  Eyes: Pupils are equal, round, and reactive to light. Conjunctivae and EOM are normal.  Pupils are approximately 2 mm and equal bilaterally.  Neck: Normal range of motion. Neck supple.  Cardiovascular: Normal rate, regular rhythm, normal heart sounds and intact distal pulses.  Pulses:      Radial pulses are 2+ on the right side, and 2+ on the left side.       Dorsalis pedis pulses are 2+ on the right side, and 2+ on the left side.  Pulmonary/Chest: Effort normal and breath sounds normal. No respiratory distress.  Abdominal: Soft. There is no tenderness. There is no guarding.  Musculoskeletal: He exhibits no edema.  Lymphadenopathy:    He has no cervical adenopathy.  Neurological: He is alert and oriented to person, place, and time.  Reports decreased sensation and tingling upon light touch to the right upper extremity, worse in the right hand.  This abnormality also present in the right posterior neck and right side of the scalp. Sensation grossly intact to light touch in the other extremities. No noted speech deficits. No aphasia. Patient handles oral secretions without difficulty. No noted swallowing defects.  Grip  strength weaker on the right. No arm drift. Strength 4/5 in the right bicep/tricep, strength 5/5 on the left. Strength 5/5 with flexion and extension of the hips, knees, and ankles bilaterally.  Negative Romberg, but patient became intermittently unsteady on his feet, falling towards the left. Patient had deficit with finger-to-nose coordination.  He states he felt less coordinated with heel-to-shin testing with the right lower extremity, but he was able to adequately and readily complete this test. Cranial nerves III-XII grossly intact.  No facial droop.   Skin: Skin is warm and dry. He is not diaphoretic.  Psychiatric: He has a normal mood and affect.  His behavior is normal.  Nursing note and vitals reviewed.    ED Treatments / Results  Labs (all labs ordered are listed, but only abnormal results are displayed) Labs Reviewed  CBC - Abnormal; Notable for the following components:      Result Value   MCV 100.2 (*)    All other components within normal limits  COMPREHENSIVE METABOLIC PANEL - Abnormal; Notable for the following components:   Glucose, Bld 106 (*)    AST 46 (*)    ALT 65 (*)    All other components within normal limits  CBG MONITORING, ED - Abnormal; Notable for the following components:   Glucose-Capillary 108 (*)    All other components within normal limits  I-STAT CHEM 8, ED - Abnormal; Notable for the following components:   Glucose, Bld 112 (*)    Calcium, Ion 0.87 (*)    All other components within normal limits  ETHANOL  PROTIME-INR  APTT  DIFFERENTIAL  RAPID URINE DRUG SCREEN, HOSP PERFORMED  URINALYSIS, ROUTINE W REFLEX MICROSCOPIC  I-STAT TROPONIN, ED    EKG EKG Interpretation  Date/Time:  Tuesday June 25 2018 06:44:30 EDT Ventricular Rate:  76 PR Interval:    QRS Duration: 91 QT Interval:  379 QTC Calculation: 427 R Axis:   69 Text Interpretation:  Sinus rhythm with 1st degree A-V block Consider left ventricular hypertrophy No  significant change since last tracing Confirmed by Pryor Curia [id number]) on 06/25/2018 6:49:54 AM Also confirmed by Ward, Cyril Mourning [phone]), editor Oswaldo Milian, Beverly ([id number])  on 06/25/2018 7:43:21 AM   Radiology No results found.  Procedures Procedures (including critical care time)  Medications Ordered in ED Medications  LORazepam (ATIVAN) injection 0-4 mg (0 mg Intravenous Not Given 06/25/18 0800)    Or  LORazepam (ATIVAN) tablet 0-4 mg ( Oral See Alternative 06/25/18 0800)  LORazepam (ATIVAN) injection 0-4 mg (has no administration in time range)    Or  LORazepam (ATIVAN) tablet 0-4 mg (has no administration in time range)  thiamine (VITAMIN B-1) tablet 100 mg ( Oral See Alternative 06/25/18 0948)    Or  thiamine (B-1) injection 100 mg (100 mg Intravenous Given 06/25/18 0948)     Initial Impression / Assessment and Plan / ED Course  I have reviewed the triage vital signs and the nursing notes.  Pertinent labs & imaging results that were available during my care of the patient were reviewed by me and considered in my medical decision making (see chart for details).  Clinical Course as of Jun 25 957  Tue Jun 25, 2018  0746 Spoke with Dr. Cheral Marker, neurologist.  States we can cancel the head CT.  Recommends placing orders for MRA of the head without contrast, MRI brain without contrast, and MRA of the neck with contrast.  They will see the patient in consultation.   [SJ]  [id number] Spoke with Dr. Reesa Chew, IM resident. States they will come assess the patient for admission.    [SJ]    Clinical Course User Index [SJ] Jojo Geving C, PA-C   Patient presents with extremity weakness, decreased sensation, and difficulty with coordination.  Symptoms concerning for possible stroke outside activation window.  Patient admitted for continued work-up and management.  MRA and MRI of the head and MRA of the neck pending at time of admission.  Findings and plan of care discussed with Lajean Saver, MD.  Dr. Ashok Cordia personally evaluated and examined this patient.  Vitals:   06/25/18 0759 06/25/18 0800 06/25/18 0815 06/25/18  0942  BP: 127/72 137/67 (!) 111/93 127/69  Pulse: 66 72 63 66  Resp:  19 19 18   Temp:      TempSrc:      SpO2:  98% 97% 98%  Weight:      Height:         Final Clinical Impressions(s) / ED Diagnoses   Final diagnoses:  Stroke-like symptoms    ED Discharge Orders    None       Lorayne Bender, PA-C 06/25/18 1000    Lajean Saver, MD 06/25/18 1310

## 2018-06-25 NOTE — H&P (Addendum)
Date: 06/25/2018               Patient Name:  Samuel Ferrell MRN: [id number]  DOB: August 20, 1946 Age / Sex: 72 y.o., male   PCP: Tisovec, Fransico Him, MD         Medical Service: Internal Medicine Teaching Service         Attending Physician: Dr. Annia Belt, MD    First Contact: Dr. Laural Golden Pager: [phone]  Second Contact: Dr. Heber Hamel Pager: [phone]       After Hours (After 5p/  First Contact Pager: [phone]  weekends / holidays): Second Contact Pager: [phone]   Chief Complaint: Right-sided numbness and weakness for 2 days.  History of Present Illness: Samuel Ferrell is a 72 y.o. male, with PMHx of A.flutter,on Xarelto, alcohol abuse, COPD, TIA, HTN, hyperlipidemia, presented to ED with complaint of right-sided numbness and weakness for 2 days.  Patient was in his usual state of health until yesterday morning, he experienced some tingling and numbness along right posterior neck which progressed to the right side of head from yesterday morning around 4 AM, did had one episode of feeling a little unsteady on his feet yesterday which resolved.  He woke up this morning and noticed progression of his right-sided numbness which now involved upper and lower extremities and was having difficulty with right upper extremity fine movements and coordination.  Patient has an history of atypical atrial flutter, failed ablation twice in 2007 and 2009 done at [facility], last saw Dr. Rayann Heman in 01/2014 and he was started on flecainide and plan was to reattempt ablation after cessation of alcohol as there was a strong suspicion that alcohol abuse is contributing to his arrhythmia.  No documentation for any follow-up since then.  According to patient he followed up regularly with his primary care and his cardiologist Dr. Wynonia Lawman at least twice a year and is compliant with his Xarelto and home meds.  Recently he was experiencing some problems with Xarelto, went into the donut hole and started taking 1 of  his friends Eliquis and applied for patient assistant program and hoping to restart in a day or 2.  Patient denies any headaches, blurry vision, nasal congestion, upper respiratory symptoms, chest pain, palpitations, exertional dyspnea, orthopnea or PND. He denies any recent change in his appetite, weight or bowel habits. He denies any urinary symptoms and compliance with his finasteride for BPH. Patient has an history of daily alcohol use and takes at least 6 beers per day for many years since the age 86, never went more than 1-2 days without alcohol, to become little irritable and depressed without alcohol but denies any hallucinations or withdrawal seizures, stating that he he was never without alcohol for more than 1 to 2 days.  ED course.  Patient was found to have mild right facial droop with paresthesias of right upper extremity.  Strength seems intact and symmetrical.  MRI without any acute infarct.  Labs with some leukocytosis and mild transaminitis.  Was admitted with internal medicine to complete stroke work-up.  Meds:  Current Meds  Medication Sig  . apixaban (ELIQUIS) 5 MG TABS tablet Take 5 mg by mouth 2 (two) times daily.  Marland Kitchen atorvastatin (LIPITOR) 80 MG tablet Take 1 tablet by mouth daily.  . enalapril (VASOTEC) 20 MG tablet Take 20 mg by mouth at bedtime.   . ferrous sulfate 325 (65 FE) MG tablet Take 325 mg by mouth daily with breakfast.  .  finasteride (PROSCAR) 5 MG tablet Take 5 mg by mouth daily at 12 noon.  . flecainide (TAMBOCOR) 50 MG tablet Take 1 tablet (50 mg total) by mouth 2 (two) times daily.  . metoprolol (LOPRESSOR) 50 MG tablet Take 50 mg by mouth 2 (two) times daily.   Marland Kitchen omeprazole (PRILOSEC) 20 MG capsule Take 20 mg by mouth daily.   . verapamil (CALAN) 120 MG tablet Take 120 mg by mouth as needed.      Allergies: Allergies as of 06/25/2018  . (No Known Allergies)   Past Medical History:  Diagnosis Date  . Afib [facility])    s/p ablation x2 by Dr [name]  at [facility] in 2007, 2009  . Alcohol abuse   . 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 COPD (chronic obstructive pulmonary disease) ([place])   . CVA (cerebral infarction)   . Hyperlipidemia   . Hypertension   . Palpitations   . Skin cancer--melanoma   . Stroke [facility])    TIA    Family History: Does not know much about his family history as he is not in contact with them.  Apparently father died of heart attack at the age of 11 but he was not sure.  Social History: Currently retired, used to do painting job. Smokes 1 pack/day since age 59. During at least 6 beers per day since age 75. Denies any other illicit drug use.  Review of Systems: A complete ROS was negative except as per HPI.   Physical Exam: Blood pressure (!) 146/65, pulse 72, temperature (!) 97.5 F (36.4 C), temperature source Oral, resp. rate 18, height 6\' 2"  (1.88 m), weight 83.5 kg, SpO2 97 %. Vitals:   06/25/18 1000 06/25/18 1100 06/25/18 1130 06/25/18 1215  BP: 132/68 129/62 (!) 130/58 (!) 146/65  Pulse: 77 71 70 72  Resp: 18 20 19 18   Temp:    (!) 97.5 F (36.4 C)  TempSrc:    Oral  SpO2: 97% 97% 98% 97%  Weight:      Height:       General: Vital signs reviewed.  Patient is well-developed and well-nourished, in no acute distress and cooperative with exam.  Head: Normocephalic and atraumatic. Eyes: EOMI, conjunctivae normal, no scleral icterus.  Neck: Supple, trachea midline, normal ROM, no JVD, masses, thyromegaly, or carotid bruit present.  Cardiovascular: RRR, S1 normal, S2 normal, no murmurs, gallops, or rubs. Pulmonary/Chest: Clear to auscultation bilaterally, no wheezes, rales, or rhonchi. Abdominal: Soft, non-tender, non-distended, BS +, Extremities: No lower extremity edema bilaterally,  pulses symmetric and intact bilaterally. No cyanosis or clubbing. Neurological: A&O x3, mild right facial droop,strength is normal and symmetric bilaterally, cranial nerve II-XII are grossly intact, no focal motor deficit,  paresthesias to right jaw, right side of neck and right upper extremity. Skin: Warm, dry and intact. No rashes or erythema. Psychiatric: Normal mood and affect. speech and behavior is normal. Cognition and memory are normal.  EKG: personally reviewed my interpretation is sinus rhythm with first-degree heart block.  No acute change.  Assessment & Plan by Problem:  Samuel Ferrell is a 72 y.o. male, with PMHx of A.flutter,on Xarelto, alcohol abuse, COPD, TIA, HTN, hyperlipidemia, presented to ED with complaint of right-sided numbness and weakness for 2 days.  Active Problems:   CVA (cerebral vascular accident) [facility]) Not much significant neurologic deficit, mostly sensory.  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 Neurology is following, recently ordered MRI  of cervical spine. Patient already take Lipitor 80 mg daily. -Getting stroke work-up which include -A1c -Lipid panel -Echocardiogram. -PT OT evaluation.  History of atypical atrial flutter.  Currently in sinus rhythm.  Failed 2 ablations in the past in 2007 and 2009.  There was some concern of his arrhythmia related to his alcohol abuse, another ablation after alcohol cessation which patient is not interested at this time.  He followed up with Dr. Wynonia Lawman regularly and is compliant with his metoprolol, flecainide and Xarelto.  Using Eliquis for the past 2 weeks because of some financial issues and inability to get Xarelto, he was hoping to get an approval in a day or so. Patient is on calcium channel and beta-blocker, having first-degree heart block which was present for the past few years. -Continue home dose of flecainide and metoprolol. -Continue Xarelto. -Holding verapamil for now which can be restarted if needed.  Hypertension.  Currently normotensive to mildly elevated blood pressure. No sign of acute infarct on brain MRI. We will hold enalapril and verapamil at this  time. -Restart from tomorrow or earlier if needed.  BPH.  Continue home dose of finasteride.  Alcohol abuse.  Significant alcohol use, no history of DVT or alcohol withdrawal seizures but patient was never without alcohol for more than 1 to 2 days, do become little irritable after a day of getting no alcohol. Last use was yesterday around 6 PM. -CIWA protocol with Ativan.  Transaminitis.  Mildly elevated AST and ALT most likely secondary to his extensive alcohol abuse. -We will repeat CMP in the morning. -Will need further hepatic work-up as an outpatient if already has not been done.  Macrocytosis.  Patient has MCV of 100.2 without any anemia.  Patient has a long-standing history of significant alcohol use. -We will get benefit with vitamin B12 levels and MMA as vitamin B12 deficiency can also cause some paresthesias.  CODE STATUS.  Full. DVT prophylaxis.  Xarelto Diet.  Heart healthy  Dispo: Admit patient to Observation with expected length of stay less than 2 midnights.  SignedLorella Nimrod, MD 06/25/2018, 1:15 PM  Pager: [phone]

## 2018-06-25 NOTE — ED Triage Notes (Signed)
Pt states when he woke up yesterday morning at 0600 he was having right sided neck pain but no numbness or weakness. Pt states he went to bed feeling "normal" and woke up at 0600 with right sided numbness in face and into neck and right arm. Pt does have right sided grip weakness with no drift. Pt able to stand and ambulate to bed. Pt is alert and ox4.

## 2018-06-25 NOTE — Progress Notes (Signed)
PT Cancellation Note  Patient Details Name: Samuel Ferrell MRN: [id number] DOB: 18-Apr-1946   Cancelled Treatment:    Reason Eval/Treat Not Completed: Patient at procedure or test/unavailable. Will check back as time allows.   Chunky 06/25/2018, 3:07 PM

## 2018-06-26 DIAGNOSIS — G8191 Hemiplegia, unspecified affecting right dominant side: Secondary | ICD-10-CM

## 2018-06-26 DIAGNOSIS — I10 Essential (primary) hypertension: Secondary | ICD-10-CM

## 2018-06-26 DIAGNOSIS — J449 Chronic obstructive pulmonary disease, unspecified: Secondary | ICD-10-CM

## 2018-06-26 DIAGNOSIS — M4802 Spinal stenosis, cervical region: Secondary | ICD-10-CM | POA: Diagnosis not present

## 2018-06-26 DIAGNOSIS — M4722 Other spondylosis with radiculopathy, cervical region: Secondary | ICD-10-CM

## 2018-06-26 DIAGNOSIS — M5011 Cervical disc disorder with radiculopathy,  high cervical region: Secondary | ICD-10-CM

## 2018-06-26 DIAGNOSIS — I4892 Unspecified atrial flutter: Secondary | ICD-10-CM

## 2018-06-26 DIAGNOSIS — Z79899 Other long term (current) drug therapy: Secondary | ICD-10-CM

## 2018-06-26 DIAGNOSIS — I6521 Occlusion and stenosis of right carotid artery: Secondary | ICD-10-CM

## 2018-06-26 DIAGNOSIS — E785 Hyperlipidemia, unspecified: Secondary | ICD-10-CM

## 2018-06-26 DIAGNOSIS — Z8673 Personal history of transient ischemic attack (TIA), and cerebral infarction without residual deficits: Secondary | ICD-10-CM

## 2018-06-26 DIAGNOSIS — Z7901 Long term (current) use of anticoagulants: Secondary | ICD-10-CM

## 2018-06-26 LAB — COMPREHENSIVE METABOLIC PANEL
ALT: 64 U/L — ABNORMAL HIGH (ref 0–44)
ANION GAP: 11 (ref 5–15)
AST: 47 U/L — ABNORMAL HIGH (ref 15–41)
Albumin: 3.7 g/dL (ref 3.5–5.0)
Alkaline Phosphatase: 81 U/L (ref 38–126)
BUN: 16 mg/dL (ref 8–23)
CALCIUM: 9.5 mg/dL (ref 8.9–10.3)
CHLORIDE: 104 mmol/L (ref 98–111)
CO2: 25 mmol/L (ref 22–32)
CREATININE: 1.1 mg/dL (ref 0.61–1.24)
Glucose, Bld: 93 mg/dL (ref 70–99)
POTASSIUM: 4.2 mmol/L (ref 3.5–5.1)
SODIUM: 140 mmol/L (ref 135–145)
Total Bilirubin: 0.6 mg/dL (ref 0.3–1.2)
Total Protein: 7.1 g/dL (ref 6.5–8.1)

## 2018-06-26 LAB — CBC
HCT: 45.1 % (ref 39.0–52.0)
Hemoglobin: 14.6 g/dL (ref 13.0–17.0)
MCH: 32.3 pg (ref 26.0–34.0)
MCHC: 32.4 g/dL (ref 30.0–36.0)
MCV: 99.8 fL (ref 80.0–100.0)
NRBC: 0 % (ref 0.0–0.2)
PLATELETS: 172 10*3/uL (ref 150–400)
RBC: 4.52 MIL/uL (ref 4.22–5.81)
RDW: 13.4 % (ref 11.5–15.5)
WBC: 5.2 10*3/uL (ref 4.0–10.5)

## 2018-06-26 LAB — GLUCOSE, CAPILLARY
Glucose-Capillary: 102 mg/dL — ABNORMAL HIGH (ref 70–99)
Glucose-Capillary: 181 mg/dL — ABNORMAL HIGH (ref 70–99)

## 2018-06-26 LAB — LIPID PANEL
Cholesterol: 129 mg/dL (ref 0–200)
HDL: 34 mg/dL — AB (ref >40)
LDL CALC: 77 mg/dL (ref 0–99)
TRIGLYCERIDES: 89 mg/dL (ref <150)
Total CHOL/HDL Ratio: 3.8 RATIO
VLDL: 18 mg/dL (ref 0–40)

## 2018-06-26 LAB — HEMOGLOBIN A1C
Hgb A1c MFr Bld: 4.7 % — ABNORMAL LOW (ref 4.8–5.6)
Mean Plasma Glucose: 88.19 mg/dL

## 2018-06-26 LAB — HIV ANTIBODY (ROUTINE TESTING W REFLEX): HIV Screen 4th Generation wRfx: NONREACTIVE

## 2018-06-26 MED ORDER — ASPIRIN 81 MG PO TBEC: 81.0000 mg | DELAYED_RELEASE_TABLET | Freq: Every day | ORAL | 0 refills | Status: DC

## 2018-06-26 NOTE — Progress Notes (Signed)
   Subjective: Samuel Ferrell reported feeling well today. He said his neck and right sided UE numbness and weakness was still present but improved since yesterday. He denied any LE weakness. He has some right sided lower back numbness and tingling but he noticed his neck and UE numbness to be worse. He said he was able to ambulate with no trouble today.   Objective:  Vital signs in last 24 hours: Vitals:   06/25/18 1400 06/25/18 1845 06/25/18 2325 06/26/18 0617  BP: 138/60 136/65 111/66 136/70  Pulse: 71 65 72 62  Resp:  18 18 18   Temp:  97.9 F (36.6 C) 97.6 F (36.4 C) 97.6 F (36.4 C)  TempSrc:  Oral Oral Oral  SpO2:  96% 95% 96%  Weight:      Height:       Physical Exam  Constitutional: He is oriented to person, place, and time and well-developed, well-nourished, and in no distress.  Cardiovascular: Normal rate, regular rhythm and normal heart sounds.  No murmur heard. Pulmonary/Chest: Effort normal and breath sounds normal. No respiratory distress. He has no wheezes.  Abdominal: Soft. Bowel sounds are normal.  Musculoskeletal: He exhibits no edema.  Neurological: He is alert and oriented to person, place, and time. No cranial nerve deficit. Gait normal. Coordination abnormal.  Uncoordinated right sided finger to nose test, bilateral UE and LE strength 5/5, sensation intact     Assessment/Plan:  Active Problems:   CVA (cerebral vascular accident) ([place])   TIA (transient ischemic attack)   PAF (paroxysmal atrial fibrillation) ([facility])   Osteoarthritis of spine with radiculopathy, cervical region   Alcohol abuse, daily use  Samuel Ferrell a 72 y.o.male, with PMHx ofA.flutter,on Xarelto, alcohol abuse, COPD, TIA, HTN, hyperlipidemia, presented to ED with complaint of right-sided numbness and weakness for 2 days.  Active Problems:   CVA (cerebral vascular accident) ([place])  TIA vs cerebellar stroke vs cervical radiculopathy  - pt said right sided UE numbness and weakness has  improved but still present. 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 - stroke work up negative, A1c 4.7, lipid panel showed low HDL, echo LV EF 09-47% normal systolic function, no regional wall abnormalities, grade 1 diastolic dysfunction, mild focal calcification of anterior leaflet of mitral valve - PT/OT recommended outpatient PT/OT  - MRI of cervical spine shows C5-6 spondylosis with central disc herniation, bilateral foraminal stenosis, C4-5 central disc herniation, C3-4 disc bulge - neurosurgery consulted, appreciate recommendations  Hx of atypical atrial flutter - NSR  - c/w home dose of flecainide and metoprolol and Xarelto  - holding verapamil for now which can be restarted if needed  Hypertension - normotensive - held enalapril and verapamil due to stroke rule out, will continue at discharge  Alcohol abuse -CIWA protocol with Ativan.  Dispo: Anticipated discharge is today.  Samuel Craze, DO 06/26/2018, 11:32 AM Pager: [phone]

## 2018-06-26 NOTE — Progress Notes (Signed)
Reviewed discharge instructions with patient. Patient discharged via wheelchair to car.

## 2018-06-26 NOTE — Evaluation (Signed)
Occupational Therapy Evaluation Patient Details Name: Samuel Ferrell MRN: [id number] DOB: 1946-01-01 Today's Date: 06/26/2018    History of Present Illness Samuel Ferrell is a 72 y.o. male, with PMHx of A.flutter,on Xarelto, alcohol abuse, COPD, TIA, HTN, hyperlipidemia, presented to ED with complaint of right-sided numbness and weakness for 2 days. MRI neg for acute infarct.   Clinical Impression   Pt was admitted for the above.  He was independent with adls prior to admission. Pt reports that RUE coordination is improving but he still has decreased motor control, decreased sensation and decreased coordination.  Issued HEP for coordination, soft theraputty for gross grasp and educated on compensatory/safety strategies. Recommend OP OT    Follow Up Recommendations  Outpatient OT    Equipment Recommendations  None recommended by OT((seated shower))    Recommendations for Other Services       Precautions / Restrictions Precautions Precautions: Fall Restrictions Weight Bearing Restrictions: No      Mobility Bed Mobility Overal bed mobility: Independent             General bed mobility comments: oob  Transfers Overall transfer level: Modified independent Equipment used: None             General transfer comment: Pt was able to power-up to full stand from low sitting position without assistance. Pt appears slightly unsteady but able to recover without assistance and no overt LOB noted.     Balance Overall balance assessment: Needs assistance Sitting-balance support: Feet supported;No upper extremity supported Sitting balance-Leahy Scale: Fair     Standing balance support: No upper extremity supported Standing balance-Leahy Scale: Fair                             ADL either performed or assessed with clinical judgement   ADL Overall ADL's : Needs assistance/impaired Eating/Feeding: Modified independent;Sitting Eating/Feeding Details (indicate cue  type and reason): used mostly LUE; able to open containers Grooming: Set up Grooming Details (indicate cue type and reason): uses primary L UE Upper Body Bathing: Set up;Sitting   Lower Body Bathing: Set up;Bed level   Upper Body Dressing : Minimal assistance;Moderate assistance;Sitting   Lower Body Dressing: Minimal assistance;Sit to/from stand   Toilet Transfer: Designer, fashion/clothing and Hygiene: Modified independent         General ADL Comments: pt needing adl assistance primarily for FMC/fasteners. Able to don socks and slip on shoes. Wears button down shirts; jeans with fasteners.       Vision         Perception     Praxis      Pertinent Vitals/Pain Pain Assessment: Faces Faces Pain Scale: Hurts little more Pain Location: neck Pain Descriptors / Indicators: Headache Pain Intervention(s): Limited activity within patient's tolerance;Monitored during session;Repositioned     Hand Dominance Right   Extremity/Trunk Assessment Upper Extremity Assessment Upper Extremity Assessment: RUE deficits/detail RUE Deficits / Details: strength is wfls but extra effort.  Decreased motor control; hit himself in face RUE Sensation: decreased light touch;decreased proprioception RUE Coordination: decreased gross motor;decreased fine motor   Lower Extremity Assessment Lower Extremity Assessment: RLE deficits/detail RLE Deficits / Details: Pt reporting numbness in R hip. Strength 5/5.  RLE Sensation: WNL(Outside of R hip) RLE Coordination: WNL   Cervical / Trunk Assessment Cervical / Trunk Assessment: Other exceptions Cervical / Trunk Exceptions: Forward head posture with rounded shoulders.    Communication Communication Communication:  No difficulties   Cognition Arousal/Alertness: Awake/alert Behavior During Therapy: WFL for tasks assessed/performed Overall Cognitive Status: Within Functional Limits for tasks assessed                                  General Comments: A/O x4   General Comments  educated on strategies for motor control; supporting elbow or forearm on table, tucking arm close to body and using LUE near face.  Coordination HEP given. Pt states motor control has been improving since admission.  Gave him red foam to build up spoon initially and also soft putty to just start with gross grasp exercise for now    Exercises Exercises: (worked on HEP in room)   [place] expects to be discharged to:: Private residence Living Arrangements: Spouse/significant other Available Help at Discharge: Family;Available 24 hours/day Type of Home: House Home Access: Stairs to enter [employer] of Steps: 4-5 Entrance Stairs-Rails: Right;Left;Can reach both Home Layout: One level     Bathroom Shower/Tub: Occupational psychologist: Standard     Home Equipment: None          Prior Functioning/Environment Level of Independence: Independent                 OT Problem List: Decreased strength;Decreased coordination;Impaired UE functional use;Pain      OT Treatment/Interventions: Self-care/ADL training;Therapeutic exercise;Therapeutic activities;Patient/family education    OT Goals(Current goals can be found in the care plan section) Acute Rehab OT Goals Patient Stated Goal: Home today - as soon as possible OT Goal Formulation: With patient Time For Goal Achievement: 07/03/18 Potential to Achieve Goals: Good ADL Goals Pt/caregiver will Perform Home Exercise Program: Right Upper extremity;With written HEP provided(independently) Additional ADL Goal #1: pt will incorporate RUE into activities below 90 degrees and utilize compensatory strategies for motor control without cues  OT Frequency: Min 2X/week   Barriers to D/C:            Co-evaluation              AM-PAC PT "6 Clicks" Daily Activity     Outcome Measure Help from another  person eating meals?: None Help from another person taking care of personal grooming?: A Little Help from another person toileting, which includes using toliet, bedpan, or urinal?: A Little Help from another person bathing (including washing, rinsing, drying)?: A Little Help from another person to put on and taking off regular upper body clothing?: A Lot Help from another person to put on and taking off regular lower body clothing?: A Little 6 Click Score: 18   End of Session    Activity Tolerance: Patient tolerated treatment well Patient left: in chair;with call bell/phone within reach;with family/visitor present  OT Visit Diagnosis: Muscle weakness (generalized) (M62.81)                Time: 7867-6720 OT Time Calculation (min): 21 min Charges:  OT General Charges $OT Visit: 1 Visit OT Evaluation $OT Eval Low Complexity: 1 Low  Lesle Chris, OTR/L Acute Rehabilitation Services [phone] [facility] pager [phone] office 06/26/2018  [place] 06/26/2018, 1:20 PM

## 2018-06-26 NOTE — Consult Note (Signed)
Reason for Consult: Cervical spondylosis, cervical stenosis, cervicalgia, cervical radiculopathy, cervical myelopathy Referring Physician: Dr. Freddie Breech is an 72 y.o. male.  HPI: The patient is a 72 year old white male with a history of cerebrovascular accident, tobacco and alcohol abuse on Xarelto for A. fib.  He began having neck pain and numbness on approximately 06/22/2018.  The patient was admitted for a stroke work-up.  The work-up was negative.  He then had a cervical MRI which demonstrated herniated disc, spondylosis, stenosis.  A neurosurgical consultation was requested.  Presently the patient is alert and pleasant.  He admits to right-sided neck pain with pain numbness and tingling mainly into his right arm diffusely.  He does not localize his pain or numbness well.  He also admits to some numbness in his right thorax.  He previously had some numbness into his right scalp but this has resolved.  He does not complain of any speech or vision difficulties.  He does not have any problems with his legs.  Past Medical History:  Diagnosis Date  . Afib [facility])    s/p ablation x2 by Dr [name] at [facility] in 2007, 2009  . Alcohol abuse   . 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 COPD (chronic obstructive pulmonary disease) ([place])   . CVA (cerebral infarction)   . Hyperlipidemia   . Hypertension   . Palpitations   . Skin cancer--melanoma   . Stroke [facility])    TIA    Past Surgical History:  Procedure Laterality Date  . APPENDECTOMY    . ATRIAL FIBRILLATION ABLATION  2007 ,2009   Dr [name] at [facility] History  Problem Relation Age of Onset  . Cancer Unknown   . Colon cancer Neg Hx   . Esophageal cancer Neg Hx   . Rectal cancer Neg Hx   . Stomach cancer Neg Hx     Social History:  reports that he has been smoking cigarettes. He has a 35.00 pack-year smoking history. He has never used smokeless tobacco. He reports that he drinks alcohol. He  reports that he does not use drugs.  Allergies: No Known Allergies  Medications:  I have reviewed the patient's current medications. Prior to Admission:  Medications Prior to Admission  Medication Sig Dispense Refill Last Dose  . apixaban (ELIQUIS) 5 MG TABS tablet Take 5 mg by mouth 2 (two) times daily.   06/24/2018 at 1800  . atorvastatin (LIPITOR) 80 MG tablet Take 1 tablet by mouth daily.   06/24/2018 at Unknown time  . enalapril (VASOTEC) 20 MG tablet Take 20 mg by mouth at bedtime.    06/24/2018 at Unknown time  . ferrous sulfate 325 (65 FE) MG tablet Take 325 mg by mouth daily with breakfast.   06/24/2018 at Unknown time  . finasteride (PROSCAR) 5 MG tablet Take 5 mg by mouth daily at 12 noon.  2 06/24/2018 at Unknown time  . flecainide (TAMBOCOR) 50 MG tablet Take 1 tablet (50 mg total) by mouth 2 (two) times daily. 180 tablet 0 06/24/2018 at Unknown time  . metoprolol (LOPRESSOR) 50 MG tablet Take 50 mg by mouth 2 (two) times daily.    06/24/2018 at 0600  . omeprazole (PRILOSEC) 20 MG capsule Take 20 mg by mouth daily.    06/24/2018 at Unknown time  . verapamil (CALAN) 120 MG tablet Take 120 mg by mouth as needed.    Past Month at prn  .  XARELTO 20 MG TABS tablet Take 1 tablet by mouth daily.    at [facility]   Scheduled: .  stroke: mapping our early stages of recovery book   Does not apply Once  . aspirin EC  81 mg Oral Daily  . atorvastatin  80 mg Oral Daily  . finasteride  5 mg Oral Q1200  . flecainide  50 mg Oral BID  . LORazepam  0-4 mg Intravenous Q6H   Or  . LORazepam  0-4 mg Oral Q6H  . [START ON 06/27/2018] LORazepam  0-4 mg Intravenous Q12H   Or  . [START ON 06/27/2018] LORazepam  0-4 mg Oral Q12H  . metoprolol tartrate  50 mg Oral BID  . pantoprazole  40 mg Oral Daily  . rivaroxaban  20 mg Oral Q supper  . sodium chloride flush  3 mL Intravenous Q12H  . thiamine  100 mg Oral Daily   Or  . thiamine  100 mg Intravenous Daily   Continuous:  [id number]  **OR** acetaminophen, polyethylene glycol Anti-infectives (From admission, onward)   None       Results for orders placed or performed during the hospital encounter of 06/25/18 (from the past 48 hour(s))  CBG monitoring, ED     Status: Abnormal   Collection Time: 06/25/18  6:46 AM  Result Value Ref Range   Glucose-Capillary 108 (H) 70 - 99 mg/dL  Protime-INR     Status: None   Collection Time: 06/25/18  7:01 AM  Result Value Ref Range   Prothrombin Time 13.8 11.4 - 15.2 seconds   INR 1.07     Comment: Performed at [facility], [place] [address], [place], [place] [address]  APTT     Status: None   Collection Time: 06/25/18  7:01 AM  Result Value Ref Range   aPTT 32 24 - 36 seconds    Comment: Performed at [facility] [address] [place], [place] [address]  CBC     Status: Abnormal   Collection Time: 06/25/18  7:01 AM  Result Value Ref Range   WBC 4.9 4.0 - 10.5 K/uL   RBC 4.61 4.22 - 5.81 MIL/uL   Hemoglobin 15.1 13.0 - 17.0 g/dL   HCT 46.2 39.0 - 52.0 %   MCV 100.2 (H) 80.0 - 100.0 fL   MCH 32.8 26.0 - 34.0 pg   MCHC 32.7 30.0 - 36.0 g/dL   RDW 13.4 11.5 - 15.5 %   Platelets 191 150 - 400 K/uL   nRBC 0.0 0.0 - 0.2 %    Comment: Performed at [facility], [place] [address], [place], [place] [address]  Differential     Status: None   Collection Time: 06/25/18  7:01 AM  Result Value Ref Range   Neutrophils Relative % 63 %   Neutro Abs 3.0 1.7 - 7.7 K/uL   Lymphocytes Relative 23 %   Lymphs Abs 1.2 0.7 - 4.0 K/uL   Monocytes Relative 11 %   Monocytes Absolute 0.6 0.1 - 1.0 K/uL   Eosinophils Relative 2 %   Eosinophils Absolute 0.1 0.0 - 0.5 K/uL   Basophils Relative 1 %   Basophils Absolute 0.0 0.0 - 0.1 K/uL   Immature Granulocytes 0 %   Abs Immature Granulocytes 0.02 0.00 - 0.07 K/uL    Comment: Performed at [facility] [address], [place], [place] [address]  Comprehensive metabolic panel     Status: Abnormal   Collection Time:  06/25/18  7:01 AM  Result Value Ref Range   Sodium 139 135 - 145 mmol/L   Potassium 3.6 3.5 - 5.1 mmol/L   Chloride 102 98 - 111 mmol/L   CO2 26 22 - 32 mmol/L   Glucose, Bld 106 (H) 70 - 99 mg/dL   BUN 9 8 - 23 mg/dL   Creatinine, Ser 1.06 0.61 - 1.24 mg/dL   Calcium 9.8 8.9 - 10.3 mg/dL   Total Protein 7.6 6.5 - 8.1 g/dL   Albumin 4.1 3.5 - 5.0 g/dL   AST 46 (H) 15 - 41 U/L   ALT 65 (H) 0 - 44 U/L   Alkaline Phosphatase 78 38 - 126 U/L   Total Bilirubin 0.8 0.3 - 1.2 mg/dL   GFR calc non Af Amer >60 >60 mL/min   GFR calc Af Amer >60 >60 mL/min    Comment: (NOTE) The eGFR has been calculated using the CKD EPI equation. This calculation has not been validated in all clinical situations. eGFR's persistently <60 mL/min signify possible Chronic Kidney Disease.    Anion gap 11 5 - 15    Comment: Performed at [facility] [address] [place], [place] [address]  I-stat troponin, ED     Status: None   Collection Time: 06/25/18  7:06 AM  Result Value Ref Range   Troponin i, poc 0.00 0.00 - 0.08 ng/mL   Comment 3            Comment: Due to the release kinetics of cTnI, a negative result within the first hours of the onset of symptoms does not rule out myocardial infarction with certainty. If myocardial infarction is still suspected, repeat the test at appropriate intervals.   I-Stat Chem 8, ED     Status: Abnormal   Collection Time: 06/25/18  7:08 AM  Result Value Ref Range   Sodium 136 135 - 145 mmol/L   Potassium 4.4 3.5 - 5.1 mmol/L   Chloride 109 98 - 111 mmol/L   BUN 12 8 - 23 mg/dL   Creatinine, Ser 0.90 0.61 - 1.24 mg/dL   Glucose, Bld 112 (H) 70 - 99 mg/dL   Calcium, Ion 0.87 (LL) 1.15 - 1.40 mmol/L   TCO2 24 22 - 32 mmol/L   Hemoglobin 15.3 13.0 - 17.0 g/dL   HCT 45.0 39.0 - 52.0 %   Comment NOTIFIED PHYSICIAN   Ethanol     Status: None   Collection Time: 06/25/18  7:11 AM  Result Value Ref Range   Alcohol, Ethyl (B) <10 <10 mg/dL    Comment:  (NOTE) Lowest detectable limit for serum alcohol is 10 mg/dL. For medical purposes only. Performed at [facility], [place] [address] [place], [place] [address]   HIV antibody (Routine Testing)     Status: None   Collection Time: 06/25/18 10:52 AM  Result Value Ref Range   HIV Screen 4th Generation wRfx Non Reactive Non Reactive    Comment: (NOTE) Performed At: [facility] [place], [place] [id number] Rush Farmer MD [id number]   Urine rapid drug screen (hosp performed)     Status: None   Collection Time: 06/25/18 11:24 AM  Result Value Ref Range   Opiates NONE DETECTED NONE DETECTED   Cocaine NONE DETECTED NONE DETECTED   Benzodiazepines NONE DETECTED NONE DETECTED   Amphetamines NONE DETECTED NONE DETECTED   Tetrahydrocannabinol NONE DETECTED NONE DETECTED   Barbiturates NONE DETECTED NONE DETECTED    Comment: (  NOTE) DRUG SCREEN FOR MEDICAL PURPOSES ONLY.  IF CONFIRMATION IS NEEDED FOR ANY PURPOSE, NOTIFY LAB WITHIN 5 DAYS. LOWEST DETECTABLE LIMITS FOR URINE DRUG SCREEN Drug Class                     Cutoff (ng/mL) Amphetamine and metabolites    1000 Barbiturate and metabolites    200 Benzodiazepine                 762 Tricyclics and metabolites     300 Opiates and metabolites        300 Cocaine and metabolites        300 THC                            50 Performed at [facility], [place] [address], [place], [place] [address]   Urinalysis, Routine w reflex microscopic     Status: None   Collection Time: 06/25/18 11:24 AM  Result Value Ref Range   Color, Urine YELLOW YELLOW   APPearance CLEAR CLEAR   Specific Gravity, Urine 1.009 1.005 - 1.030   pH 5.0 5.0 - 8.0   Glucose, UA NEGATIVE NEGATIVE mg/dL   Hgb urine dipstick NEGATIVE NEGATIVE   Bilirubin Urine NEGATIVE NEGATIVE   Ketones, ur NEGATIVE NEGATIVE mg/dL   Protein, ur NEGATIVE NEGATIVE mg/dL   Nitrite NEGATIVE NEGATIVE   Leukocytes, UA NEGATIVE NEGATIVE    Comment:  Performed at [facility] [address], [place], [place] [address]  Hemoglobin A1c     Status: Abnormal   Collection Time: 06/26/18  4:58 AM  Result Value Ref Range   Hgb A1c MFr Bld 4.7 (L) 4.8 - 5.6 %    Comment: (NOTE) Pre diabetes:          5.7%-6.4% Diabetes:              >6.4% Glycemic control for   <7.0% adults with diabetes    Mean Plasma Glucose 88.19 mg/dL    Comment: Performed at [facility] [address], [place], [place] [address]  Lipid panel     Status: Abnormal   Collection Time: 06/26/18  4:58 AM  Result Value Ref Range   Cholesterol 129 0 - 200 mg/dL   Triglycerides 89 <150 mg/dL   HDL 34 (L) >40 mg/dL   Total CHOL/HDL Ratio 3.8 RATIO   VLDL 18 0 - 40 mg/dL   LDL Cholesterol 77 0 - 99 mg/dL    Comment:        Total Cholesterol/HDL:CHD Risk Coronary Heart Disease Risk Table                     Men   Women  1/2 Average Risk   3.4   3.3  Average Risk       5.0   4.4  2 X Average Risk   9.6   7.1  3 X Average Risk  23.4   11.0        Use the calculated Patient Ratio above and the CHD Risk Table to determine the patient's CHD Risk.        ATP III CLASSIFICATION (LDL):  <100     mg/dL   Optimal  100-129  mg/dL   Near or Above                    Optimal  130-159  mg/dL   Borderline  160-189  mg/dL   High  >190     mg/dL   Very High Performed at [facility] [address] [place], [place] [address]   Comprehensive metabolic panel     Status: Abnormal   Collection Time: 06/26/18  4:58 AM  Result Value Ref Range   Sodium 140 135 - 145 mmol/L   Potassium 4.2 3.5 - 5.1 mmol/L   Chloride 104 98 - 111 mmol/L   CO2 25 22 - 32 mmol/L   Glucose, Bld 93 70 - 99 mg/dL   BUN 16 8 - 23 mg/dL   Creatinine, Ser 1.10 0.61 - 1.24 mg/dL   Calcium 9.5 8.9 - 10.3 mg/dL   Total Protein 7.1 6.5 - 8.1 g/dL   Albumin 3.7 3.5 - 5.0 g/dL   AST 47 (H) 15 - 41 U/L   ALT 64 (H) 0 - 44 U/L   Alkaline Phosphatase 81 38 - 126 U/L   Total Bilirubin 0.6  0.3 - 1.2 mg/dL   GFR calc non Af Amer >60 >60 mL/min   GFR calc Af Amer >60 >60 mL/min    Comment: (NOTE) The eGFR has been calculated using the CKD EPI equation. This calculation has not been validated in all clinical situations. eGFR's persistently <60 mL/min signify possible Chronic Kidney Disease.    Anion gap 11 5 - 15    Comment: Performed at [facility] [address] [place], [place] [address]  CBC     Status: None   Collection Time: 06/26/18  4:58 AM  Result Value Ref Range   WBC 5.2 4.0 - 10.5 K/uL   RBC 4.52 4.22 - 5.81 MIL/uL   Hemoglobin 14.6 13.0 - 17.0 g/dL   HCT 45.1 39.0 - 52.0 %   MCV 99.8 80.0 - 100.0 fL   MCH 32.3 26.0 - 34.0 pg   MCHC 32.4 30.0 - 36.0 g/dL   RDW 13.4 11.5 - 15.5 %   Platelets 172 150 - 400 K/uL   nRBC 0.0 0.0 - 0.2 %    Comment: Performed at [facility], [place] [address], [place], [place] [address]  Glucose, capillary     Status: Abnormal   Collection Time: 06/26/18  7:39 AM  Result Value Ref Range   Glucose-Capillary 102 (H) 70 - 99 mg/dL  Glucose, capillary     Status: Abnormal   Collection Time: 06/26/18 12:17 PM  Result Value Ref Range   Glucose-Capillary 181 (H) 70 - 99 mg/dL    Mr Jodene Nam Head Wo Contrast  Result Date: 06/25/2018 CLINICAL DATA:  Focal neuro deficit greater than 6 hours, stroke suspected EXAM: MRI HEAD WITHOUT   CONTRAST MRA HEAD WITHOUT CONTRAST MRA NECK WITHOUT AND WITH CONTRAST TECHNIQUE: Multiplanar, multiecho pulse sequences of the brain and surrounding structures were obtained without intravenous contrast. Angiographic images of the Circle of Willis were obtained using MRA technique without intravenous contrast. Angiographic images of the neck were obtained using MRA technique without and with intravenous contrast. Carotid stenosis measurements (when applicable) are obtained utilizing NASCET criteria, using the distal internal carotid diameter as the denominator. CONTRAST:  9 mL Gadovist IV COMPARISON:   MRI head 11/15/2006 FINDINGS: MRI HEAD FINDINGS Brain: Moderate atrophy. Mild chronic microvascular ischemic change. Diffusion hyperintensities in the right parietal white matter and left frontal white matter most likely T2 shine through. No definite acute infarct. Negative for hemorrhage mass or fluid collection. No midline shift. Vascular:  Normal arterial flow voids Skull and upper cervical spine: 1 cm hyperintense signal in the right clivus is unchanged from the prior study consistent with a benign lesion. No other bony lesion Sinuses/Orbits: Moderate mucosal edema paranasal sinuses. Effusion in the left petrous apex. Bilateral cataract surgery. Other: None MRA HEAD FINDINGS Both vertebral arteries patent to the basilar. Left vertebral dominant. PICA patent bilaterally. Basilar patent. AICA, superior cerebellar, and posterior cerebral arteries are widely patent. Internal carotid artery widely patent. Anterior and middle cerebral arteries widely patent without stenosis or occlusion. Negative for cerebral aneurysm. MRA NECK FINDINGS Antegrade flow in the carotid and vertebral arteries bilaterally. Atherosclerotic plaque in the subclavian artery bilaterally with mild stenosis. Right carotid bifurcation widely patent. Moderate focal stenosis of the internal carotid artery approximately 3 cm distal to the bifurcation. Left carotid bifurcation widely patent without stenosis. Both vertebral arteries patent to the basilar without stenosis. Proximal vertebral artery not well visualized due to artifact from motion. IMPRESSION: 1. Negative for acute infarct. T2 shine through in the white matter bilaterally. Moderate atrophy. 2. Sinus mucosal disease. 3. Negative MRA head 4. 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 Otherwise negative MRA neck Electronically Signed   By: Franchot Gallo M.D.   On: 06/25/2018 10:54   Mr Jodene Nam Neck W Wo Contrast  Result Date: 06/25/2018 CLINICAL DATA:  Focal neuro deficit  greater than 6 hours, stroke suspected EXAM: MRI HEAD WITHOUT   CONTRAST MRA HEAD WITHOUT CONTRAST MRA NECK WITHOUT AND WITH CONTRAST TECHNIQUE: Multiplanar, multiecho pulse sequences of the brain and surrounding structures were obtained without intravenous contrast. Angiographic images of the Circle of Willis were obtained using MRA technique without intravenous contrast. Angiographic images of the neck were obtained using MRA technique without and with intravenous contrast. Carotid stenosis measurements (when applicable) are obtained utilizing NASCET criteria, using the distal internal carotid diameter as the denominator. CONTRAST:  9 mL Gadovist IV COMPARISON:  MRI head 11/15/2006 FINDINGS: MRI HEAD FINDINGS Brain: Moderate atrophy. Mild chronic microvascular ischemic change. Diffusion hyperintensities in the right parietal white matter and left frontal white matter most likely T2 shine through. No definite acute infarct. Negative for hemorrhage mass or fluid collection. No midline shift. Vascular: Normal arterial flow voids Skull and upper cervical spine: 1 cm hyperintense signal in the right clivus is unchanged from the prior study consistent with a benign lesion. No other bony lesion Sinuses/Orbits: Moderate mucosal edema paranasal sinuses. Effusion in the left petrous apex. Bilateral cataract surgery. Other: None MRA HEAD FINDINGS Both vertebral arteries patent to the basilar. Left vertebral dominant. PICA patent bilaterally. Basilar patent. AICA, superior cerebellar, and posterior cerebral arteries are widely patent. Internal carotid artery widely patent. Anterior and middle cerebral arteries widely patent without stenosis or occlusion. Negative for cerebral aneurysm. MRA NECK FINDINGS Antegrade flow in the carotid and vertebral arteries bilaterally. Atherosclerotic plaque in the subclavian artery bilaterally with mild stenosis. Right carotid bifurcation widely patent. Moderate focal stenosis of the internal  carotid artery approximately 3 cm distal to the bifurcation. Left carotid bifurcation widely patent without stenosis. Both vertebral arteries patent to the basilar without stenosis. Proximal vertebral artery not well visualized due to artifact from motion. IMPRESSION: 1. Negative for acute infarct. T2 shine through in the white matter bilaterally. Moderate atrophy. 2. Sinus mucosal disease. 3. Negative MRA head 4. 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 Otherwise negative MRA neck Electronically Signed   By: Franchot Gallo M.D.   On: 06/25/2018 10:54   Mr Brain  Wo Contrast  Result Date: 06/25/2018 CLINICAL DATA:  Focal neuro deficit greater than 6 hours, stroke suspected EXAM: MRI HEAD WITHOUT   CONTRAST MRA HEAD WITHOUT CONTRAST MRA NECK WITHOUT AND WITH CONTRAST TECHNIQUE: Multiplanar, multiecho pulse sequences of the brain and surrounding structures were obtained without intravenous contrast. Angiographic images of the Circle of Willis were obtained using MRA technique without intravenous contrast. Angiographic images of the neck were obtained using MRA technique without and with intravenous contrast. Carotid stenosis measurements (when applicable) are obtained utilizing NASCET criteria, using the distal internal carotid diameter as the denominator. CONTRAST:  9 mL Gadovist IV COMPARISON:  MRI head 11/15/2006 FINDINGS: MRI HEAD FINDINGS Brain: Moderate atrophy. Mild chronic microvascular ischemic change. Diffusion hyperintensities in the right parietal white matter and left frontal white matter most likely T2 shine through. No definite acute infarct. Negative for hemorrhage mass or fluid collection. No midline shift. Vascular: Normal arterial flow voids Skull and upper cervical spine: 1 cm hyperintense signal in the right clivus is unchanged from the prior study consistent with a benign lesion. No other bony lesion Sinuses/Orbits: Moderate mucosal edema paranasal sinuses. Effusion in the  left petrous apex. Bilateral cataract surgery. Other: None MRA HEAD FINDINGS Both vertebral arteries patent to the basilar. Left vertebral dominant. PICA patent bilaterally. Basilar patent. AICA, superior cerebellar, and posterior cerebral arteries are widely patent. Internal carotid artery widely patent. Anterior and middle cerebral arteries widely patent without stenosis or occlusion. Negative for cerebral aneurysm. MRA NECK FINDINGS Antegrade flow in the carotid and vertebral arteries bilaterally. Atherosclerotic plaque in the subclavian artery bilaterally with mild stenosis. Right carotid bifurcation widely patent. Moderate focal stenosis of the internal carotid artery approximately 3 cm distal to the bifurcation. Left carotid bifurcation widely patent without stenosis. Both vertebral arteries patent to the basilar without stenosis. Proximal vertebral artery not well visualized due to artifact from motion. IMPRESSION: 1. Negative for acute infarct. T2 shine through in the white matter bilaterally. Moderate atrophy. 2. Sinus mucosal disease. 3. Negative MRA head 4. 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 Otherwise negative MRA neck Electronically Signed   By: Franchot Gallo M.D.   On: 06/25/2018 10:54   Mr Cervical Spine Wo Contrast  Result Date: 06/25/2018 CLINICAL DATA:  Right face and arm numbness. Possible weakness. Symptoms began over the last 2 days. EXAM: MRI CERVICAL SPINE WITHOUT CONTRAST TECHNIQUE: Multiplanar, multisequence MR imaging of the cervical spine was performed. No intravenous contrast was administered. COMPARISON:  None. FINDINGS: Alignment: Straightening of the normal cervical lordosis. One or 2 mm anterolisthesis C4-5. One or 2 mm retrolisthesis C5-6. Vertebrae: Discogenic endplate changes.  No primary bone finding. Cord: No cord compression or primary cord lesion. Posterior Fossa, vertebral arteries, paraspinal tissues: Negative Disc levels: Foramen magnum is widely  patent. Ordinary osteoarthritis at the C1-2 articulation but without encroachment upon the neural structures. C2-3: Normal C3-4: Mild bulging of the disc. Mild right-sided facet degeneration. No canal stenosis. Mild right foraminal stenosis. C4-5: Central bulging of the disc effaces the ventral subarachnoid space but does not compress the cord. Foramina are sufficiently patent. C5-6: Spondylosis with endplate osteophytes and central disc herniation with caudal down turning behind the upper portion of C6. Effacement of the subarachnoid space. AP diameter of the canal only 8.2 mm. Slight indentation of the cord. Question early abnormal T2 signal within the cord. Bilateral foraminal stenosis could affect either exiting C6 nerve. C6-7: Spondylosis with endplate osteophytes and bulging of the disc. Indentation of the ventral subarachnoid  space but no compressive effect upon the cord. Mild bilateral foraminal narrowing. C7-T1: Normal interspace. IMPRESSION: At C5-6, there is spondylosis with a central disc herniation with caudal down turning behind upper C6. Canal stenosis with AP diameter of 8.2 mm. Some cord deformity, possibly with early abnormal T2 signal within the cord. Bilateral foraminal stenosis could compress either C6 nerve. At C4-5, there is a central disc herniation that effaces the ventral subarachnoid space and contacts the ventral cord but does not appear to cause cord compression. Foramina widely patent. C3-4: Disc bulge. Right-sided facet arthropathy. Right foraminal narrowing could possibly affect the right C4 nerve. Electronically Signed   By: Nelson Chimes M.D.   On: 06/25/2018 15:01    ROS: As above Blood pressure 114/62, pulse 74, temperature 98.1 F (36.7 C), temperature source Oral, resp. rate 18, height '6\' 2"'  (1.88 m), weight 83.5 kg, SpO2 96 %. Estimated body mass index is 23.62 kg/m as calculated from the following:   Height as of this encounter: '6\' 2"'  (1.88 m).   Weight as of this  encounter: 83.5 kg.  Physical Exam  General: An unkempt and thin 72 year old white male in no apparent distress.  HEENT: Alopecia, he is wearing glasses  Neck: Supple without masses or deformities.  He has limited cervical range of motion.  Spurling's testing is positive on the right.  Thorax: Symmetric  Abdomen: Soft  Extremities: Unremarkable  Neurologic exam the patient is alert and oriented x3.  Cranial nerves II through XII are examined bilaterally grossly normal.  The patient's motor strength is normal in his bilateral deltoid, bicep, quadriceps, gastrocnemius and dorsiflexors.  His left hand grip and wrist extensor strength is normal.  He has some weakness in his right hand grip and wrist extensor at 4+/5.  He has some right hand apraxia and uncoordination.  He has some difficulty with rapid altering movements of his right hand.  Rapid alternating movements are normal on the left.  The patient's deep tendon reflexes are increased in his bilateral quadricep and gastrocnemius.  There is no ankle clonus.  Tensor exam demonstrates decreased light touch sensation is entire right upper extremity.  I reviewed the patient's cervical MRI performed at [facility] on 06/25/2018.  The patient has a central bulging/small herniated disc at C3-4 and C4-5 without significant neural compression.  At C5-6 he has moderate disc bulge/spondylosis with spinal stenosis and neural foraminal stenosis.  There is some evidence of spinal cord signal change at C5-6.  The other levels are fairly unremarkable.  Assessment/Plan: Cervical spondylosis, stenosis, cervical radiculopathy, cervical myelopathy: I have discussed the situation with the patient and his wife.  I told him he has some narrowing in his neck which may be causing his symptoms.  I do not think he needs urgent surgery, particularly since he is anticoagulated.  I have given my phone number and asked him to schedule an appointment to see me in the  office in the next week or 2 so we can go over his films and I can discuss the surgical options.  I have answered all his questions.  Ophelia Charter 06/26/2018, 1:06 PM

## 2018-06-26 NOTE — Care Management Note (Signed)
Case Management Note  Patient Details  Name: Samuel Ferrell MRN: [id number] Date of Birth: [birth year]  Subjective/Objective:  72yo male presented with s/o right sided numbness and weakness. PMH: A.flutter, alcohol abuse, COPD, TIA, HTN and hyperlipidemia.                 Action/Plan: CM met with patient/spouse to discuss transitional needs. Patient lives at home with spouse, independent with ADLs. PCP verified as: Dr. Osborne Casco; pharmacy of choice: [facility]. CM discussed PT/OT recommendations for outpatient PT/OT and a seated shower, with patient declining at this time. Patient stated Dr. Arnoldo Morale (Neurosurgery) recommends surgery; patient verbalized it would be a waste of money now if Dr. Arnoldo Morale can fix his problem. Patient indicated having a seated chair available to use during his showers. CM made patient's primary nurse aware. No further needs from CM.   Expected Discharge Date:  06/26/18               Expected Discharge Plan:  Home/Self Care  In-House Referral:  NA  Discharge planning Services  CM Consult  Post Acute Care Choice:  NA Choice offered to:  NA  DME Arranged:  N/A DME Agency:  NA  HH Arranged:  NA HH Agency:  NA  Status of Service:  Completed, signed off  If discussed at [place] of Stay Meetings, dates discussed:    Additional Comments:  Midge Minium RN, BSN, NCM-BC, ACM-RN [phone] 06/26/2018, 1:52 PM

## 2018-06-26 NOTE — Care Management Obs Status (Signed)
[place] NOTIFICATION   Patient Details  Name: GIBRIL MASTRO MRN: [id number] Date of Birth: [birth year]   Medicare Observation Status Notification Given:  Yes    Midge Minium RN, BSN, NCM-BC, ACM-RN [phone] 06/26/2018, 1:51 PM

## 2018-06-26 NOTE — Discharge Summary (Addendum)
Name: Samuel Ferrell MRN: [id number] DOB: 02/04/1946 72 y.o. PCP: Haywood Pao, MD  Date of Admission: 06/25/2018  6:36 AM Date of Discharge: 06/26/2018 Attending Physician: Dr. Aldine Contes  Discharge Diagnosis: 1. Cervical radiculopathy  Discharge Medications: Allergies as of 06/26/2018   No Known Allergies     Medication List    STOP taking these medications   ELIQUIS 5 MG Tabs tablet Generic drug:  apixaban     TAKE these medications   aspirin 81 MG EC tablet Take 1 tablet (81 mg total) by mouth daily.   atorvastatin 80 MG tablet Commonly known as:  LIPITOR Take 1 tablet by mouth daily.   enalapril 20 MG tablet Commonly known as:  VASOTEC Take 20 mg by mouth at bedtime.   ferrous sulfate 325 (65 FE) MG tablet Take 325 mg by mouth daily with breakfast.   finasteride 5 MG tablet Commonly known as:  PROSCAR Take 5 mg by mouth daily at 12 noon.   flecainide 50 MG tablet Commonly known as:  TAMBOCOR Take 1 tablet (50 mg total) by mouth 2 (two) times daily.   metoprolol tartrate 50 MG tablet Commonly known as:  LOPRESSOR Take 50 mg by mouth 2 (two) times daily.   omeprazole 20 MG capsule Commonly known as:  PRILOSEC Take 20 mg by mouth daily.   verapamil 120 MG tablet Commonly known as:  CALAN Take 120 mg by mouth as needed.   XARELTO 20 MG Tabs tablet Generic drug:  rivaroxaban Take 1 tablet by mouth daily.       Disposition and follow-up:   Samuel Ferrell was discharged from [facility] in Stable condition.  At the hospital follow up visit please address:  1.  Cervical radiculopathy: Patient was evaluated by neurosurgery for cervical disc herniations and recommended to follow up in 1-2 weeks, please make sure patient has scheduled this f/u  2.  Labs / imaging needed at time of follow-up: none  3.  Pending labs/ test needing follow-up: none  Follow-up Appointments:   Follow up with PCP Follow up with  Neurosurgery in 1-2 weeks after discharge  Hospital Course by problem list: 1. Cervical radiculopathy- Patient presented with complaint of right-sided UE numbness and weakness for 2 days. He noticed numbness and discomfort on the back of his neck and by the morning he was having paresthesias involving the entire right side of his scalp neck and extending down his right arm and leg. 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 Stroke work up was negative. Echo did not show any abnormal findings, with a preserved EF. MRI of cervical spine showed significant degenerative changes throughout cervical spine, disc protrusion mild at C3-4, moderate at C4-5 and central disc herniation at C5-6 with effacement of the subarachnoid space at that level.  There is slight indentation of the cord with a question of early abnormal T2 signal.  Bilateral foraminal stenosis. Neurosurgery evaluated patient and recommended outpatient follow up to discuss surgical options.   Discharge Vitals:   BP 114/62 (BP Location: Left Arm)   Pulse 74   Temp 98.1 F (36.7 C) (Oral)   Resp 18   Ht 6\' 2"  (1.88 m)   Wt 83.5 kg   SpO2 96%   BMI 23.62 kg/m   Pertinent Labs, Studies, and Procedures:   CBC Latest Ref Rng & Units 06/26/2018 06/25/2018 06/25/2018  WBC  4.0 - 10.5 K/uL 5.2 - 4.9  Hemoglobin 13.0 - 17.0 g/dL 14.6 15.3 15.1  Hematocrit 39.0 - 52.0 % 45.1 45.0 46.2  Platelets 150 - 400 K/uL 172 - 191   CMP Latest Ref Rng & Units 06/26/2018 06/25/2018 06/25/2018  Glucose 70 - 99 mg/dL 93 112(H) 106(H)  BUN 8 - 23 mg/dL 16 12 9   Creatinine 0.61 - 1.24 mg/dL 1.10 0.90 1.06  Sodium 135 - 145 mmol/L 140 136 139  Potassium 3.5 - 5.1 mmol/L 4.2 4.4 3.6  Chloride 98 - 111 mmol/L 104 109 102  CO2 22 - 32 mmol/L 25 - 26  Calcium 8.9 - 10.3 mg/dL 9.5 - 9.8  Total Protein 6.5 - 8.1 g/dL 7.1 - 7.6  Total Bilirubin 0.3 - 1.2 mg/dL 0.6  - 0.8  Alkaline Phos 38 - 126 U/L 81 - 78  AST 15 - 41 U/L 47(H) - 46(H)  ALT 0 - 44 U/L 64(H) - 65(H)   Mr Samuel Ferrell Wo Contrast  Result Date: 06/25/2018 CLINICAL DATA:  Focal neuro deficit greater than 6 hours, stroke suspected EXAM: MRI Ferrell WITHOUT   CONTRAST MRA Ferrell WITHOUT CONTRAST MRA NECK WITHOUT AND WITH CONTRAST TECHNIQUE: Multiplanar, multiecho pulse sequences of the brain and surrounding structures were obtained without intravenous contrast. Angiographic images of the Circle of Willis were obtained using MRA technique without intravenous contrast. Angiographic images of the neck were obtained using MRA technique without and with intravenous contrast. Carotid stenosis measurements (when applicable) are obtained utilizing NASCET criteria, using the distal internal carotid diameter as the denominator. CONTRAST:  9 mL Gadovist IV COMPARISON:  MRI Ferrell 11/15/2006 FINDINGS: MRI Ferrell FINDINGS Brain: Moderate atrophy. Mild chronic microvascular ischemic change. Diffusion hyperintensities in the right parietal white matter and left frontal white matter most likely T2 shine through. No definite acute infarct. Negative for hemorrhage mass or fluid collection. No midline shift. Vascular: Normal arterial flow voids Skull and upper cervical spine: 1 cm hyperintense signal in the right clivus is unchanged from the prior study consistent with a benign lesion. No other bony lesion Sinuses/Orbits: Moderate mucosal edema paranasal sinuses. Effusion in the left petrous apex. Bilateral cataract surgery. Other: None MRA Ferrell FINDINGS Both vertebral arteries patent to the basilar. Left vertebral dominant. PICA patent bilaterally. Basilar patent. AICA, superior cerebellar, and posterior cerebral arteries are widely patent. Internal carotid artery widely patent. Anterior and middle cerebral arteries widely patent without stenosis or occlusion. Negative for cerebral aneurysm. MRA NECK FINDINGS Antegrade flow in the  carotid and vertebral arteries bilaterally. Atherosclerotic plaque in the subclavian artery bilaterally with mild stenosis. Right carotid bifurcation widely patent. Moderate focal stenosis of the internal carotid artery approximately 3 cm distal to the bifurcation. Left carotid bifurcation widely patent without stenosis. Both vertebral arteries patent to the basilar without stenosis. Proximal vertebral artery not well visualized due to artifact from motion. IMPRESSION: 1. Negative for acute infarct. T2 shine through in the white matter bilaterally. Moderate atrophy. 2. Sinus mucosal disease. 3. Negative MRA Ferrell 4. 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 Otherwise negative MRA neck Electronically Signed   By: Franchot Gallo M.D.   On: 06/25/2018 10:54   Mr Samuel Nam Neck W Wo Contrast  Result Date: 06/25/2018 CLINICAL DATA:  Focal neuro deficit greater than 6 hours, stroke suspected EXAM: MRI Ferrell WITHOUT   CONTRAST MRA Ferrell WITHOUT CONTRAST MRA NECK WITHOUT AND WITH CONTRAST TECHNIQUE: Multiplanar, multiecho pulse sequences of the brain and surrounding structures were obtained without intravenous  contrast. Angiographic images of the Circle of Willis were obtained using MRA technique without intravenous contrast. Angiographic images of the neck were obtained using MRA technique without and with intravenous contrast. Carotid stenosis measurements (when applicable) are obtained utilizing NASCET criteria, using the distal internal carotid diameter as the denominator. CONTRAST:  9 mL Gadovist IV COMPARISON:  MRI Ferrell 11/15/2006 FINDINGS: MRI Ferrell FINDINGS Brain: Moderate atrophy. Mild chronic microvascular ischemic change. Diffusion hyperintensities in the right parietal white matter and left frontal white matter most likely T2 shine through. No definite acute infarct. Negative for hemorrhage mass or fluid collection. No midline shift. Vascular: Normal arterial flow voids Skull and upper cervical spine:  1 cm hyperintense signal in the right clivus is unchanged from the prior study consistent with a benign lesion. No other bony lesion Sinuses/Orbits: Moderate mucosal edema paranasal sinuses. Effusion in the left petrous apex. Bilateral cataract surgery. Other: None MRA Ferrell FINDINGS Both vertebral arteries patent to the basilar. Left vertebral dominant. PICA patent bilaterally. Basilar patent. AICA, superior cerebellar, and posterior cerebral arteries are widely patent. Internal carotid artery widely patent. Anterior and middle cerebral arteries widely patent without stenosis or occlusion. Negative for cerebral aneurysm. MRA NECK FINDINGS Antegrade flow in the carotid and vertebral arteries bilaterally. Atherosclerotic plaque in the subclavian artery bilaterally with mild stenosis. Right carotid bifurcation widely patent. Moderate focal stenosis of the internal carotid artery approximately 3 cm distal to the bifurcation. Left carotid bifurcation widely patent without stenosis. Both vertebral arteries patent to the basilar without stenosis. Proximal vertebral artery not well visualized due to artifact from motion. IMPRESSION: 1. Negative for acute infarct. T2 shine through in the white matter bilaterally. Moderate atrophy. 2. Sinus mucosal disease. 3. Negative MRA Ferrell 4. 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 Otherwise negative MRA neck Electronically Signed   By: Franchot Gallo M.D.   On: 06/25/2018 10:54   Mr Brain Wo Contrast  Result Date: 06/25/2018 CLINICAL DATA:  Focal neuro deficit greater than 6 hours, stroke suspected EXAM: MRI Ferrell WITHOUT   CONTRAST MRA Ferrell WITHOUT CONTRAST MRA NECK WITHOUT AND WITH CONTRAST TECHNIQUE: Multiplanar, multiecho pulse sequences of the brain and surrounding structures were obtained without intravenous contrast. Angiographic images of the Circle of Willis were obtained using MRA technique without intravenous contrast. Angiographic images of the neck  were obtained using MRA technique without and with intravenous contrast. Carotid stenosis measurements (when applicable) are obtained utilizing NASCET criteria, using the distal internal carotid diameter as the denominator. CONTRAST:  9 mL Gadovist IV COMPARISON:  MRI Ferrell 11/15/2006 FINDINGS: MRI Ferrell FINDINGS Brain: Moderate atrophy. Mild chronic microvascular ischemic change. Diffusion hyperintensities in the right parietal white matter and left frontal white matter most likely T2 shine through. No definite acute infarct. Negative for hemorrhage mass or fluid collection. No midline shift. Vascular: Normal arterial flow voids Skull and upper cervical spine: 1 cm hyperintense signal in the right clivus is unchanged from the prior study consistent with a benign lesion. No other bony lesion Sinuses/Orbits: Moderate mucosal edema paranasal sinuses. Effusion in the left petrous apex. Bilateral cataract surgery. Other: None MRA Ferrell FINDINGS Both vertebral arteries patent to the basilar. Left vertebral dominant. PICA patent bilaterally. Basilar patent. AICA, superior cerebellar, and posterior cerebral arteries are widely patent. Internal carotid artery widely patent. Anterior and middle cerebral arteries widely patent without stenosis or occlusion. Negative for cerebral aneurysm. MRA NECK FINDINGS Antegrade flow in the carotid and vertebral arteries bilaterally. Atherosclerotic plaque in the subclavian artery bilaterally  with mild stenosis. Right carotid bifurcation widely patent. Moderate focal stenosis of the internal carotid artery approximately 3 cm distal to the bifurcation. Left carotid bifurcation widely patent without stenosis. Both vertebral arteries patent to the basilar without stenosis. Proximal vertebral artery not well visualized due to artifact from motion. IMPRESSION: 1. Negative for acute infarct. T2 shine through in the white matter bilaterally. Moderate atrophy. 2. Sinus mucosal disease. 3. Negative  MRA Ferrell 4. 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 Otherwise negative MRA neck Electronically Signed   By: Franchot Gallo M.D.   On: 06/25/2018 10:54   Mr Cervical Spine Wo Contrast  Result Date: 06/25/2018 CLINICAL DATA:  Right face and arm numbness. Possible weakness. Symptoms began over the last 2 days. EXAM: MRI CERVICAL SPINE WITHOUT CONTRAST TECHNIQUE: Multiplanar, multisequence MR imaging of the cervical spine was performed. No intravenous contrast was administered. COMPARISON:  None. FINDINGS: Alignment: Straightening of the normal cervical lordosis. One or 2 mm anterolisthesis C4-5. One or 2 mm retrolisthesis C5-6. Vertebrae: Discogenic endplate changes.  No primary bone finding. Cord: No cord compression or primary cord lesion. Posterior Fossa, vertebral arteries, paraspinal tissues: Negative Disc levels: Foramen magnum is widely patent. Ordinary osteoarthritis at the C1-2 articulation but without encroachment upon the neural structures. C2-3: Normal C3-4: Mild bulging of the disc. Mild right-sided facet degeneration. No canal stenosis. Mild right foraminal stenosis. C4-5: Central bulging of the disc effaces the ventral subarachnoid space but does not compress the cord. Foramina are sufficiently patent. C5-6: Spondylosis with endplate osteophytes and central disc herniation with caudal down turning behind the upper portion of C6. Effacement of the subarachnoid space. AP diameter of the canal only 8.2 mm. Slight indentation of the cord. Question early abnormal T2 signal within the cord. Bilateral foraminal stenosis could affect either exiting C6 nerve. C6-7: Spondylosis with endplate osteophytes and bulging of the disc. Indentation of the ventral subarachnoid space but no compressive effect upon the cord. Mild bilateral foraminal narrowing. C7-T1: Normal interspace. IMPRESSION: At C5-6, there is spondylosis with a central disc herniation with caudal down turning behind upper C6.  Canal stenosis with AP diameter of 8.2 mm. Some cord deformity, possibly with early abnormal T2 signal within the cord. Bilateral foraminal stenosis could compress either C6 nerve. At C4-5, there is a central disc herniation that effaces the ventral subarachnoid space and contacts the ventral cord but does not appear to cause cord compression. Foramina widely patent. C3-4: Disc bulge. Right-sided facet arthropathy. Right foraminal narrowing could possibly affect the right C4 nerve. Electronically Signed   By: Nelson Chimes M.D.   On: 06/25/2018 15:01     Discharge Instructions: Discharge Instructions    Diet - low sodium heart healthy   Complete by:  As directed    Discharge instructions   Complete by:  As directed    Please follow up with your primary care physician and call neurosurgery at discharge.  Continue to take aspirin 81mg  daily.   Increase activity slowly   Complete by:  As directed       Signed: Rehman, Charlsie Quest, DO 06/27/2018, 12:45 PM   Pager: [phone]

## 2018-06-26 NOTE — Evaluation (Signed)
Physical Therapy Evaluation Patient Details Name: Samuel Ferrell MRN: [id number] DOB: 07-19-1946 Today's Date: 06/26/2018   History of Present Illness  Samuel Ferrell is a 72 y.o. male, with PMHx of A.flutter,on Xarelto, alcohol abuse, COPD, TIA, HTN, hyperlipidemia, presented to ED with complaint of right-sided numbness and weakness for 2 days. MRI neg for acute infarct.  Clinical Impression  Pt admitted with above diagnosis. Pt currently with functional limitations due to the deficits listed below (see PT Problem List). At the time of PT eval pt was able to perform transfers with modified independence, and ambulation/stair negotiation with min guard assist for balance support and safety. Recommending follow-up with neuro outpatient therapy, however may have more OT needs vs PT. Pt will have 24 hour assistance at home, and feel he will be able to manage well if fiance' will be able to drive him to therapy appointments. Acutely, pt will benefit from skilled PT to increase their independence and safety with mobility to allow discharge to the venue listed below.       Follow Up Recommendations Outpatient PT;Supervision for mobility/OOB(Neuro Outpatient - may have more OT needs vs PT)    Equipment Recommendations  None recommended by PT    Recommendations for Other Services       Precautions / Restrictions Precautions Precautions: Fall Restrictions Weight Bearing Restrictions: No      Mobility  Bed Mobility Overal bed mobility: Independent             General bed mobility comments: HOB flat and rails lowered to simulate home environment.   Transfers Overall transfer level: Modified independent Equipment used: None             General transfer comment: Pt was able to power-up to full stand from low sitting position without assistance. Pt appears slightly unsteady but able to recover without assistance and no overt LOB noted.   Ambulation/Gait Ambulation/Gait assistance: Min  guard Gait Distance (Feet): 200 Feet Assistive device: None Gait Pattern/deviations: Step-through pattern;Decreased stride length;Antalgic Gait velocity: Decreased Gait velocity interpretation: <1.31 ft/sec, indicative of household ambulator General Gait Details: Somewhat of an antalgic gait pattern noted. Pt with notable decreased coordination of RUE and demonstrated a large amplitude arm swing at times during gait training. Hands-on guarding provided due to unsteadiness and antalgia.   Stairs Stairs: Yes Stairs assistance: Min guard Stair Management: One rail Left;Alternating pattern;Forwards Number of Stairs: 10 General stair comments: VC's for general safety awareness. Pt was cued for step-to pattern but instead wanted to attempt alternating pattern, which he did well with.   Wheelchair Mobility    Modified Rankin (Stroke Patients Only)       Balance Overall balance assessment: Needs assistance Sitting-balance support: Feet supported;No upper extremity supported Sitting balance-Leahy Scale: Fair     Standing balance support: No upper extremity supported Standing balance-Leahy Scale: Fair                               Pertinent Vitals/Pain Pain Assessment: Faces Faces Pain Scale: Hurts little more Pain Location: R neck pain and R head pain  Pain Descriptors / Indicators: Headache Pain Intervention(s): Limited activity within patient's tolerance;Monitored during session;Repositioned    Home Living Family/patient expects to be discharged to:: Private residence Living Arrangements: Spouse/significant other Available Help at Discharge: Family;Available 24 hours/day Type of Home: House Home Access: Stairs to enter Entrance Stairs-Rails: Right;Left;Can reach both Entrance Stairs-Number of Steps: 4-5 Home  Layout: One level Home Equipment: None      Prior Function Level of Independence: Independent               Hand Dominance   Dominant Hand:  Right    Extremity/Trunk Assessment   Upper Extremity Assessment Upper Extremity Assessment: RUE deficits/detail RUE Deficits / Details: Grip strength equal upon testing however pt putting forth extra effort for R hand grip. Noted pt with poor coordination of RUE and reports numbness from lateral side of head down to right hip. With light touch testing, pt reports numbness from midline anteriorly, wrapping around to the right until midline posteriorly. Gross strength decreased slightly  RUE Sensation: decreased light touch;decreased proprioception RUE Coordination: decreased gross motor;decreased fine motor    Lower Extremity Assessment Lower Extremity Assessment: RLE deficits/detail RLE Deficits / Details: Pt reporting numbness in R hip. Strength 5/5.  RLE Sensation: WNL(Outside of R hip) RLE Coordination: WNL    Cervical / Trunk Assessment Cervical / Trunk Assessment: Other exceptions Cervical / Trunk Exceptions: Forward head posture with rounded shoulders.   Communication   Communication: No difficulties  Cognition Arousal/Alertness: Awake/alert Behavior During Therapy: WFL for tasks assessed/performed Overall Cognitive Status: Within Functional Limits for tasks assessed                                 General Comments: A/O x4      General Comments      Exercises     Assessment/Plan    PT Assessment Patient needs continued PT services  PT Problem List Decreased strength;Decreased activity tolerance;Decreased range of motion;Decreased balance;Decreased mobility;Decreased knowledge of use of DME;Decreased safety awareness;Decreased knowledge of precautions;Impaired sensation;Decreased coordination       PT Treatment Interventions DME instruction;Gait training;Stair training;Functional mobility training;Therapeutic activities;Therapeutic exercise;Neuromuscular re-education;Patient/family education    PT Goals (Current goals can be found in the Care Plan  section)  Acute Rehab PT Goals Patient Stated Goal: Home today - as soon as possible PT Goal Formulation: With patient/family Time For Goal Achievement: 07/03/18 Potential to Achieve Goals: Good    Frequency Min 4X/week   Barriers to discharge        Co-evaluation               AM-PAC PT "6 Clicks" Daily Activity  Outcome Measure Difficulty turning over in bed (including adjusting bedclothes, sheets and blankets)?: None Difficulty moving from lying on back to sitting on the side of the bed? : None Difficulty sitting down on and standing up from a chair with arms (e.g., wheelchair, bedside commode, etc,.)?: None Help needed moving to and from a bed to chair (including a wheelchair)?: None Help needed walking in hospital room?: A Little Help needed climbing 3-5 steps with a railing? : A Little 6 Click Score: 22    End of Session Equipment Utilized During Treatment: Gait belt Activity Tolerance: Patient tolerated treatment well Patient left: in bed;with call bell/phone within reach;with family/visitor present(Bed in chair position) Nurse Communication: Mobility status PT Visit Diagnosis: Unsteadiness on feet (R26.81);Other symptoms and signs involving the nervous system (R29.898)    Time: 8916-9450 PT Time Calculation (min) (ACUTE ONLY): 32 min   Charges:   PT Evaluation $PT Eval Moderate Complexity: 1 Mod PT Treatments $Gait Training: 8-22 mins        Rolinda Roan, PT, DPT Acute Rehabilitation Services Pager: [phone] Office: [phone]   Thelma Comp 06/26/2018, 11:56 AM

## 2018-06-26 NOTE — Progress Notes (Signed)
Internal Medicine Attending:   I saw and examined the patient. I reviewed the resident's note and I agree with the resident's findings and plan as documented in the resident's note.  Patient states that he feels a little better today.  He states that he still has some tingling and numbness in his right upper extremity as well as some weakness in his right hand.  Patient was initially admitted to the hospital with right-sided upper extremity numbness and weakness concerning for possible TIA versus stroke.  No acute infarct was noted on his MRI.  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  2D echo showed a normal EF with grade 1 diastolic dysfunction. PT/OT follow-up and recommendations appreciated. Given his unilateral symptoms involving his right upper extremity there was concern for cervical radiculopathy.  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  Neurosurgery follow-up and recommendations appreciated.  Patient does not require urgent surgery at this point.  He will follow-up with neurosurgery as an outpatient in 1 to 2 weeks.  No further work-up at this time.  We will continue with Xarelto and metoprolol as well as flecainide for his atrial flutter.  Patient started on aspirin 81 mg here given possible TIA.  We will continue this at discharge.  Patient stable for discharge home today.

## 2018-07-10 ENCOUNTER — Telehealth: Payer: Self-pay

## 2018-07-10 NOTE — Telephone Encounter (Signed)
   [facility] Pre-operative Risk Assessment    Request for surgical clearance:  1. What type of surgery is being performed? Anterior Cervical Fusion   2. When is this surgery scheduled? TBD  3. What type of clearance is required (medical clearance vs. Pharmacy clearance to hold med vs. Both)? Both  4. Are there any medications that need to be held prior to surgery and how long? Xarelto   5. Practice name and name of physician performing surgery? [facility]/Dr. Newman Pies   6. What is your office phone number [phone]    7.   What is your office fax number [phone]  8.   Anesthesia type (None, local, MAC, general) ? Not listed   Mady Haagensen 07/10/2018, 2:50 PM  _________________________________________________________________   (provider comments below)

## 2018-07-11 NOTE — Telephone Encounter (Signed)
Received records from Dr. Thurman Coyer office. Hx of PAF/aflutter, HTN, TIA and HLD. Takes Xarelto for anticoagulation. Last seen 12/21/2017. Patient needs new provider appointment with [facility] as last seen > 6 months. Will scan records in system.

## 2018-07-11 NOTE — Telephone Encounter (Signed)
Pt has has appointment already scheduled tomorrow, November 1st @ 9 am with Dr. Geraldo Pitter.

## 2018-07-11 NOTE — Telephone Encounter (Signed)
   Primary Cardiologist: Dr. Wynonia Lawman EP: Dr. Rayann Heman (2015)  Chart reviewed as part of pre-operative protocol coverage. Please request last office visit note from Dr. Thurman Coyer office then needs phone call.   [place], [place] 07/11/2018, 2:00 PM

## 2018-07-11 NOTE — Telephone Encounter (Signed)
Spoke with Dr. Thurman Coyer office and they are going to fax records over to our office.

## 2018-07-12 ENCOUNTER — Ambulatory Visit (INDEPENDENT_AMBULATORY_CARE_PROVIDER_SITE_OTHER): Payer: Medicare Other | Admitting: Cardiology

## 2018-07-12 ENCOUNTER — Encounter: Payer: Self-pay | Admitting: Cardiology

## 2018-07-12 VITALS — BP 118/62 | HR 61 | Ht 74.0 in | Wt 194.0 lb

## 2018-07-12 DIAGNOSIS — Z7901 Long term (current) use of anticoagulants: Secondary | ICD-10-CM

## 2018-07-12 DIAGNOSIS — G459 Transient cerebral ischemic attack, unspecified: Secondary | ICD-10-CM | POA: Diagnosis not present

## 2018-07-12 DIAGNOSIS — I480 Paroxysmal atrial fibrillation: Secondary | ICD-10-CM | POA: Diagnosis not present

## 2018-07-12 DIAGNOSIS — Z01810 Encounter for preprocedural cardiovascular examination: Secondary | ICD-10-CM | POA: Diagnosis not present

## 2018-07-12 DIAGNOSIS — F17210 Nicotine dependence, cigarettes, uncomplicated: Secondary | ICD-10-CM

## 2018-07-12 DIAGNOSIS — I484 Atypical atrial flutter: Secondary | ICD-10-CM

## 2018-07-12 DIAGNOSIS — F1010 Alcohol abuse, uncomplicated: Secondary | ICD-10-CM

## 2018-07-12 DIAGNOSIS — Z720 Tobacco use: Secondary | ICD-10-CM

## 2018-07-12 NOTE — Addendum Note (Signed)
Addended by: Stevan Born on: 07/12/2018 09:47 AM   Modules accepted: Orders

## 2018-07-12 NOTE — Progress Notes (Signed)
Cardiology Office Note:    Date:  07/12/2018   ID:  Samuel Ferrell, DOB 1946-02-29, MRN [id number]  PCP:  Samuel Pao, MD  Cardiologist:  Samuel Lindau, MD   Referring MD: Samuel Pao, MD    ASSESSMENT:    1. Pre-operative cardiovascular examination   2. TIA (transient ischemic attack)   3. PAF (paroxysmal atrial fibrillation) ([place])   4. Atypical atrial flutter ([place])   5. Tobacco abuse   6. Alcohol abuse   7. Chronic anticoagulation    PLAN:    In order of problems listed above:  1. Primary prevention stressed to the patient.  Importance of compliance with diet and medication stressed and he vocalized understanding.  His blood pressure is stable and he is in sinus rhythm. 2. I spent 5 minutes with the patient discussing solely about smoking. Smoking cessation was counseled. I suggested to the patient also different medications and pharmacological interventions. Patient is keen to try stopping on its own at this time. He will get back to me if he needs any further assistance in this matter.  Alcohol abstinence was also advised. 3. In view of multiple risk factors for coronary artery disease the patient will undergo pharmacological stress testing.  If this is negative then he is not at high risk from coronary standpoint for events during the aforementioned surgery.  Antiarrhythmic medications will need to be continued in the perioperative. 4. As far as his anticoagulation is concerned, I had a lengthy discussion with him about this issue.  If he is neuro symptoms felt due to orthopedic issues involving the cervical spine (she is what I comprehend is his understanding from his discussion with his surgeon), then he should be able to get off the anticoagulation for a minimal period of time as recommended by his neurosurgeon.  He does risk having a neurological event however the risk would not be high. 5. If it is felt that his neuro event recently was from a vascular or a  cardiovascular event, then he is better off with holding his oral anticoagulation being bridged with heparin so he is covered for thromboembolic events for the most part.  I presume that his event was not considered vascular or cardiovascular based on the records and the history that he provides.  This is a decision that he will have to take in discussions with his neurosurgeon and possibly with the opinion of a neurologist. 6. Patient will be seen in follow-up appointment by his primary cardiologist if available if not I will be glad to follow him.  The risks of going off anticoagulation in general were discussed with the patient and he vocalized understanding.  Questions were answered to his satisfaction.  Total time for this evaluation was 40 minutes.  Please do not hesitate to call us if you have any questions in his management.   Medication Adjustments/Labs and Tests Ordered: Current medicines are reviewed at length with the patient today.  Concerns regarding medicines are outlined above.  No orders of the defined types were placed in this encounter.  No orders of the defined types were placed in this encounter.    No chief complaint on file.    History of Present Illness:    Samuel Ferrell is a 72 y.o. male.  The patient has past medical history of paroxysmal atrial fibrillation.  He is undergone ablation x2.  He now has atypical atrial flutter on a paroxysmal basis, history of what seemed like a  TIA.  The patient has history of essential hypertension.  He has history of alcohol and tobacco abuse and continues to do so.  Recently he was admitted to the hospital with what appears to be loss of function of the right upper extremity.  I evaluated [facility] records and there was no evidence of stroke on the brain MRA.  The patient has moderate right internal carotid artery stenosis.  There was no evidence of infarct.  Patient was evaluated by multiple physicians.  His neurosurgeon felt  that this was an orthopedic issue with the cervical spine and that the patient needs surgery for the same.  The patient denies any chest pain orthopnea or PND.  He leads a sedentary lifestyle.  Past Medical History:  Diagnosis Date  . Afib [facility])    s/p ablation x2 by Dr Samuel Ferrell at [facility] in 2007, 2009  . Alcohol abuse   . Atypical atrial flutter ([place])   . COPD (chronic obstructive pulmonary disease) ([place])   . CVA (cerebral infarction)   . Hyperlipidemia   . Hypertension   . Palpitations   . Skin cancer--melanoma   . Stroke [facility])    TIA    Past Surgical History:  Procedure Laterality Date  . APPENDECTOMY    . ATRIAL FIBRILLATION ABLATION  2007 ,2009   Dr Samuel Ferrell at [facility]      Current Medications: Current Meds  Medication Sig  . aspirin EC 81 MG EC tablet Take 1 tablet (81 mg total) by mouth daily.  Marland Kitchen atorvastatin (LIPITOR) 80 MG tablet Take 1 tablet by mouth daily.  . enalapril (VASOTEC) 20 MG tablet Take 20 mg by mouth at bedtime.   . ferrous sulfate 325 (65 FE) MG tablet Take 325 mg by mouth daily with breakfast.  . flecainide (TAMBOCOR) 50 MG tablet Take 1 tablet (50 mg total) by mouth 2 (two) times daily.  . metoprolol (LOPRESSOR) 50 MG tablet Take 50 mg by mouth 2 (two) times daily.   Marland Kitchen omeprazole (PRILOSEC) 20 MG capsule Take 20 mg by mouth daily.   . verapamil (CALAN) 120 MG tablet Take 120 mg by mouth as needed.   Samuel Ferrell 20 MG TABS tablet Take 1 tablet by mouth daily.     Allergies:   Patient has no known allergies.   Social History   Socioeconomic History  . Marital status: Single    Spouse name: Not on file  . Number of children: 2  . Years of education: Not on file  . Highest education level: Not on file  Occupational History  . Occupation: Retired  Scientific laboratory technician  . Financial resource strain: Not on file  . Food insecurity:    Worry: Not on file    Inability: Not on file  . Transportation needs:    Medical: Not on file     Non-medical: Not on file  Tobacco Use  . Smoking status: Current Every Day Smoker    Packs/day: 1.00    Years: 35.00    Pack years: 35.00    Types: Cigarettes  . Smokeless tobacco: Never Used  . Tobacco comment: Smokes less than 1 ppd  Substance and Sexual Activity  . Alcohol use: Yes    Comment: heavy, up to 6 beers per day  . Drug use: No  . Sexual activity: Not on file  Lifestyle  . Physical activity:    Days per week: Not on file    Minutes per session: Not  on file  . Stress: Not on file  Relationships  . Social connections:    Talks on phone: Not on file    Gets together: Not on file    Attends religious service: Not on file    Active member of club or organization: Not on file    Attends meetings of clubs or organizations: Not on file    Relationship status: Not on file  Other Topics Concern  . Not on file  Social History Narrative   Lives in [place].  Works as a Scientist, forensic     Family History: The patient's family history includes Cancer in his unknown relative. There is no history of Colon cancer, Esophageal cancer, Rectal cancer, or Stomach cancer.  ROS:   Please see the history of present illness.    All other systems reviewed and are negative.  EKGs/Labs/Other Studies Reviewed:    The following studies were reviewed today: EKG reveals sinus rhythm and nonspecific ST-T changes.   Recent Labs: 06/26/2018: ALT 64; BUN 16; Creatinine, Ser 1.10; Hemoglobin 14.6; Platelets 172; Potassium 4.2; Sodium 140  Recent Lipid Panel    Component Value Date/Time   CHOL 129 06/26/2018 0458   TRIG 89 06/26/2018 0458   HDL 34 (L) 06/26/2018 0458   CHOLHDL 3.8 06/26/2018 0458   VLDL 18 06/26/2018 0458   LDLCALC 77 06/26/2018 0458    Physical Exam:    VS:  BP 118/62 (BP Location: Right Arm, Patient Position: Sitting, Cuff Size: Normal)   Pulse 61   Ht 6\' 2"  (1.88 m)   Wt 194 lb (88 kg)   BMI 24.91 kg/m     Wt Readings from Last 3  Encounters:  07/12/18 194 lb (88 kg)  06/25/18 184 lb (83.5 kg)  06/24/14 188 lb (85.3 kg)     GEN: Patient is in no acute distress HEENT: Normal NECK: No JVD; No carotid bruits LYMPHATICS: No lymphadenopathy CARDIAC: Hear sounds regular, 2/6 systolic murmur at the apex. RESPIRATORY:  Clear to auscultation without rales, wheezing or rhonchi  ABDOMEN: Soft, non-tender, non-distended MUSCULOSKELETAL:  No edema; No deformity  SKIN: Warm and dry NEUROLOGIC:  Alert and oriented x 3 PSYCHIATRIC:  Normal affect   Signed, Samuel Lindau, MD  07/12/2018 9:30 AM    [place]

## 2018-07-12 NOTE — Telephone Encounter (Signed)
Will need to see updated records from Dr. Thurman Coyer office for determining pharmacy clearance

## 2018-07-12 NOTE — Patient Instructions (Signed)
Medication Instructions:  Your physician recommends that you continue on your current medications as directed. Please refer to the Current Medication list given to you today.  If you need a refill on your cardiac medications before your next appointment, please call your pharmacy.   Lab work: NONE If you have labs (blood work) drawn today and your tests are completely normal, you will receive your results only by: Marland Kitchen MyChart Message (if you have MyChart) OR . A paper copy in the mail If you have any lab test that is abnormal or we need to change your treatment, we will call you to review the results.  Testing/Procedures: Your physician has requested that you have a lexiscan myoview. For further information please visit [email or web address]. Please follow instruction sheet, as given.    Follow-Up: At [facility], you and your health needs are our priority.  As part of our continuing mission to provide you with exceptional heart care, we have created designated Provider Care Teams.  These Care Teams include your primary Cardiologist (physician) and Advanced Practice Providers (APPs -  Physician Assistants and Nurse Practitioners) who all work together to provide you with the care you need, when you need it.   You will need a follow up appointment in as needed or if symptoms worsen or fail to improve.  Any Other Special Instructions Will Be Listed Below (If Applicable).    Regadenoson injection What is this medicine? REGADENOSON is used to test the heart for coronary artery disease. It is used in patients who can not exercise for their stress test. This medicine may be used for other purposes; ask your health care provider or pharmacist if you have questions. COMMON BRAND NAME(S): Lexiscan What should I tell my health care provider before I take this medicine? They need to know if you have any of these conditions: -heart problems -lung or breathing disease, like asthma or COPD -an  unusual or allergic reaction to regadenoson, other medicines, foods, dyes, or preservatives -pregnant or trying to get pregnant -breast-feeding How should I use this medicine? This medicine is for injection into a vein. It is given by a health care professional in a hospital or clinic setting. Talk to your pediatrician regarding the use of this medicine in children. Special care may be needed. Overdosage: If you think you have taken too much of this medicine contact a poison control center or emergency room at once. NOTE: This medicine is only for you. Do not share this medicine with others. What if I miss a dose? This does not apply. What may interact with this medicine? -caffeine -dipyridamole -guarana -theophylline This list may not describe all possible interactions. Give your health care provider a list of all the medicines, herbs, non-prescription drugs, or dietary supplements you use. Also tell them if you smoke, drink alcohol, or use illegal drugs. Some items may interact with your medicine. What should I watch for while using this medicine? Your condition will be monitored carefully while you are receiving this medicine. Do not take medicines, foods, or drinks with caffeine (like coffee, tea, or colas) for at least 12 hours before your test. If you do not know if something contains caffeine, ask your health care professional. What side effects may I notice from receiving this medicine? Side effects that you should report to your doctor or health care professional as soon as possible: -allergic reactions like skin rash, itching or hives, swelling of the face, lips, or tongue -breathing problems -chest pain, tightness or  palpitations -severe headache Side effects that usually do not require medical attention (report to your doctor or health care professional if they continue or are bothersome): -flushing -headache -irritation or pain at site where injected -nausea, vomiting This  list may not describe all possible side effects. Call your doctor for medical advice about side effects. You may report side effects to FDA at 1-800-FDA-1088. Where should I keep my medicine? This drug is given in a hospital or clinic and will not be stored at home. NOTE: This sheet is a summary. It may not cover all possible information. If you have questions about this medicine, talk to your doctor, pharmacist, or health care provider.  2018 Elsevier/Gold Standard (2008-04-27 15:08:13)

## 2018-07-15 ENCOUNTER — Telehealth (HOSPITAL_COMMUNITY): Payer: Self-pay | Admitting: *Deleted

## 2018-07-15 ENCOUNTER — Telehealth (HOSPITAL_COMMUNITY): Payer: Self-pay | Admitting: Radiology

## 2018-07-15 NOTE — Telephone Encounter (Signed)
Patient given detailed instructions per Myocardial Perfusion Study Information Sheet for the test on 07/16/2018 at 9:45. Patient notified to arrive 15 minutes early and that it is imperative to arrive on time for appointment to keep from having the test rescheduled.  If you need to cancel or reschedule your appointment, please call the office within 24 hours of your appointment. . Patient verbalized understanding.EHK

## 2018-07-15 NOTE — Telephone Encounter (Signed)
Left message on voicemail in reference to upcoming appointment scheduled for 09/25/17. Phone number given for a call back so details instructions can be given. Samuel Ferrell Jacqueline   

## 2018-07-16 ENCOUNTER — Ambulatory Visit (HOSPITAL_COMMUNITY): Payer: Medicare Other | Attending: Internal Medicine

## 2018-07-16 VITALS — Ht 74.0 in | Wt 194.0 lb

## 2018-07-16 DIAGNOSIS — Z01810 Encounter for preprocedural cardiovascular examination: Secondary | ICD-10-CM | POA: Diagnosis not present

## 2018-07-16 LAB — MYOCARDIAL PERFUSION IMAGING
CHL CUP NUCLEAR SRS: 0
CHL CUP RESTING HR STRESS: 62 {beats}/min
LV dias vol: 84 mL (ref 62–150)
LVSYSVOL: 30 mL
Peak HR: 83 {beats}/min
SDS: 0
SSS: 0
TID: 1.04

## 2018-07-16 MED ORDER — TECHNETIUM TC 99M TETROFOSMIN IV KIT
31.7000 | PACK | Freq: Once | INTRAVENOUS | Status: AC | PRN
  Administered 2018-07-16: 31.7 via INTRAVENOUS
  Filled 2018-07-16: qty 32

## 2018-07-16 MED ORDER — REGADENOSON 0.4 MG/5ML IV SOLN
0.4000 mg | Freq: Once | INTRAVENOUS | Status: AC
  Administered 2018-07-16: 0.4 mg via INTRAVENOUS

## 2018-07-16 MED ORDER — TECHNETIUM TC 99M TETROFOSMIN IV KIT
10.7000 | PACK | Freq: Once | INTRAVENOUS | Status: AC | PRN
  Administered 2018-07-16: 10.7 via INTRAVENOUS
  Filled 2018-07-16: qty 11

## 2018-07-17 ENCOUNTER — Other Ambulatory Visit: Payer: Self-pay | Admitting: Neurosurgery

## 2018-07-17 NOTE — Telephone Encounter (Signed)
   Primary Cardiologist: Previously Dr. Wynonia Lawman, seen by Dr. Geraldo Pitter   Chart reviewed as part of pre-operative protocol coverage. Patient was seen by Dr. Geraldo Pitter 07/12/18 to establish care in Dr. Thurman Coyer absence (on indefinite leave). Patient had nuclear stress test, awaiting final pre-operative recommendations from Dr. Geraldo Pitter. Dr. Geraldo Pitter, can you please finalize your recommendations regarding both cardiac and anticoagulation clearance? Please forward answer to P CV DIV PREOP. Thank you.  Charlie Pitter, PA-C 07/17/2018, 1:40 PM

## 2018-07-18 ENCOUNTER — Encounter (HOSPITAL_COMMUNITY): Payer: Self-pay

## 2018-07-18 NOTE — Telephone Encounter (Signed)
Dayna, Thank you for this message. This gentleman has had a stress test and echocardiogram which are unremarkable.  Based on these findings he is not at high risk according to my evaluation note and consultation.  The issue of blood thinner was discussed by me and that consult note in detail. Dr Geraldo Pitter

## 2018-07-18 NOTE — Pre-Procedure Instructions (Signed)
Nuchem Grattan Corcino  07/18/2018      [id number] [place], [place] - [address] [facility] [place] [address] [place] [address] Phone: [phone] Fax: [phone]    Your procedure is scheduled on Wednesday November 13.  Report to [facility] Admitting at 12:25 A.M.  Call this number if you have problems the morning of surgery:  [phone]   Remember:  Do not eat or drink after midnight.    Take these medicines the morning of surgery with A SIP OF WATER:   Flecainide (Tambocor) Metoprolol (lopressor) Omeprazole (prilosec) Acetaminophen (Tylenol) if needed  7 days prior to surgery STOP taking any Aspirin(unless otherwise instructed by your surgeon), Aleve, Naproxen, Ibuprofen, Motrin, Advil, Goody's, BC's, all herbal medications, fish oil, and all vitamins  FOLLOW your surgeon's instructions on stopping Xarelto. If no instructions were given, please call your surgeon's office.     Do not wear jewelry, make-up or nail polish.  Do not wear lotions, powders, or perfumes, or deodorant.  Do not shave 48 hours prior to surgery.  Men may shave face and neck.  Do not bring valuables to the hospital.  [facility] is not responsible for any belongings or valuables.  Contacts, dentures or bridgework may not be worn into surgery.  Leave your suitcase in the car.  After surgery it may be brought to your room.  For patients admitted to the hospital, discharge time will be determined by your treatment team.  Patients discharged the day of surgery will not be allowed to drive home.    Special instructions:    [facility]- Preparing For Surgery  Before surgery, you can play an important role. Because skin is not sterile, your skin needs to be as free of germs as possible. You can reduce the number of germs on your skin by washing with CHG (chlorahexidine gluconate) Soap before surgery.  CHG is an antiseptic cleaner which kills germs and  bonds with the skin to continue killing germs even after washing.    Oral Hygiene is also important to reduce your risk of infection.  Remember - BRUSH YOUR TEETH THE MORNING OF SURGERY WITH YOUR REGULAR TOOTHPASTE  Please do not use if you have an allergy to CHG or antibacterial soaps. If your skin becomes reddened/irritated stop using the CHG.  Do not shave (including legs and underarms) for at least 48 hours prior to first CHG shower. It is OK to shave your face.  Please follow these instructions carefully.   1. Shower the NIGHT BEFORE SURGERY and the MORNING OF SURGERY with CHG.   2. If you chose to wash your hair, wash your hair first as usual with your normal shampoo.  3. After you shampoo, rinse your hair and body thoroughly to remove the shampoo.  4. Use CHG as you would any other liquid soap. You can apply CHG directly to the skin and wash gently with a scrungie or a clean washcloth.   5. Apply the CHG Soap to your body ONLY FROM THE NECK DOWN.  Do not use on open wounds or open sores. Avoid contact with your eyes, ears, mouth and genitals (private parts). Wash Face and genitals (private parts)  with your normal soap.  6. Wash thoroughly, paying special attention to the area where your surgery will be performed.  7. Thoroughly rinse your body with warm water from the neck down.  8. DO NOT shower/wash with your normal  soap after using and rinsing off the CHG Soap.  9. Pat yourself dry with a CLEAN TOWEL.  10. Wear CLEAN PAJAMAS to bed the night before surgery, wear comfortable clothes the morning of surgery  11. Place CLEAN SHEETS on your bed the night of your first shower and DO NOT SLEEP WITH PETS.    Day of Surgery:  Do not apply any deodorants/lotions.  Please wear clean clothes to the hospital/surgery center.   Remember to brush your teeth WITH YOUR REGULAR TOOTHPASTE.    Please read over the following fact sheets that you were given. Coughing and Deep  Breathing, MRSA Information and Surgical Site Infection Prevention

## 2018-07-18 NOTE — Telephone Encounter (Signed)
   Primary Cardiologist: Previously Dr. Wynonia Lawman, seen by Dr. Geraldo Pitter   Chart reviewed as part of pre-operative protocol coverage.   Dr. Geraldo Pitter has reviewed chart. Per his input, "This gentleman has had a stress test and echocardiogram which are unremarkable.  Based on these findings he is not at high risk according to my evaluation note and consultation.  The issue of blood thinner was discussed by me and that consult note in detail."   I will route this as well as most recent consult note to the requesting party via Epic fax function and remove from pre-op pool.  Please call with questions.  Charlie Pitter, PA-C 07/18/2018, 1:53 PM

## 2018-07-19 ENCOUNTER — Other Ambulatory Visit: Payer: Self-pay

## 2018-07-19 ENCOUNTER — Encounter (HOSPITAL_COMMUNITY)
Admission: RE | Admit: 2018-07-19 | Discharge: 2018-07-19 | Disposition: A | Discharge status: Home / Self Care | Payer: Medicare Other | Source: Ambulatory Visit | Attending: Neurosurgery | Admitting: Neurosurgery

## 2018-07-19 ENCOUNTER — Telehealth: Payer: Self-pay | Admitting: Cardiology

## 2018-07-19 ENCOUNTER — Encounter (HOSPITAL_COMMUNITY): Payer: Self-pay

## 2018-07-19 DIAGNOSIS — Z01818 Encounter for other preprocedural examination: Secondary | ICD-10-CM | POA: Diagnosis not present

## 2018-07-19 DIAGNOSIS — M4712 Other spondylosis with myelopathy, cervical region: Secondary | ICD-10-CM | POA: Insufficient documentation

## 2018-07-19 DIAGNOSIS — M4722 Other spondylosis with radiculopathy, cervical region: Secondary | ICD-10-CM | POA: Diagnosis not present

## 2018-07-19 LAB — CBC
HEMATOCRIT: 45.9 % (ref 39.0–52.0)
Hemoglobin: 15.3 g/dL (ref 13.0–17.0)
MCH: 33.4 pg (ref 26.0–34.0)
MCHC: 33.3 g/dL (ref 30.0–36.0)
MCV: 100.2 fL — AB (ref 80.0–100.0)
NRBC: 0 % (ref 0.0–0.2)
Platelets: 187 10*3/uL (ref 150–400)
RBC: 4.58 MIL/uL (ref 4.22–5.81)
RDW: 13.1 % (ref 11.5–15.5)
WBC: 5.8 10*3/uL (ref 4.0–10.5)

## 2018-07-19 LAB — COMPREHENSIVE METABOLIC PANEL
ALK PHOS: 70 U/L (ref 38–126)
ALT: 64 U/L — AB (ref 0–44)
AST: 49 U/L — ABNORMAL HIGH (ref 15–41)
Albumin: 4.2 g/dL (ref 3.5–5.0)
Anion gap: 7 (ref 5–15)
BUN: 12 mg/dL (ref 8–23)
CHLORIDE: 103 mmol/L (ref 98–111)
CO2: 26 mmol/L (ref 22–32)
CREATININE: 1 mg/dL (ref 0.61–1.24)
Calcium: 9.1 mg/dL (ref 8.9–10.3)
Glucose, Bld: 100 mg/dL — ABNORMAL HIGH (ref 70–99)
Potassium: 4.1 mmol/L (ref 3.5–5.1)
Sodium: 136 mmol/L (ref 135–145)
Total Bilirubin: 0.7 mg/dL (ref 0.3–1.2)
Total Protein: 7.6 g/dL (ref 6.5–8.1)

## 2018-07-19 LAB — ABO/RH: ABO/RH(D): O POS

## 2018-07-19 LAB — SURGICAL PCR SCREEN
MRSA, PCR: NEGATIVE
Staphylococcus aureus: POSITIVE — AB

## 2018-07-19 LAB — TYPE AND SCREEN
ABO/RH(D): O POS
Antibody Screen: NEGATIVE

## 2018-07-19 NOTE — Telephone Encounter (Signed)
Please call patient regarding stop taking Xarelto due to going in for surgery next week. They need notification today.

## 2018-07-19 NOTE — Telephone Encounter (Signed)
Patient's questions were answered to his satisfaction, surgeon will determine dates for xarelto.

## 2018-07-19 NOTE — Progress Notes (Signed)
PCP - Dr Osborne Casco Cardiologist - Dr. Wynonia Lawman- saw Dr. Geraldo Pitter prior to surgery  EKG - 07/12/18 Stress Test - 07/16/18 ECHO - 06/15/18  Blood Thinner Instructions: pt states he was told to stop taking Xarelto 5-7 days prior to surgery and his last dose was 07/18/18   Anesthesia review: heart hx- Afib/ablations, pt with hx TIAs, recently had right arm weakness and tingling, per pt MD thinks this is d/t his neck  Patient denies shortness of breath, fever, cough and chest pain at PAT appointment   Patient verbalized understanding of instructions that were given to them at the PAT appointment. Patient was also instructed that they will need to review over the PAT instructions again at home before surgery.

## 2018-07-19 NOTE — Progress Notes (Signed)
Nasal PCR negative for MRSA, positive for MSSA. Mupirocin called to pharmacy. Pt has concerns with copay/affording medication. If unable to get medication, pt will be treated with betadine DOS

## 2018-07-22 NOTE — Progress Notes (Addendum)
Anesthesia Chart Review:   Case:  [id number] Date/Time:  07/24/18 1410   Procedure:  ANTERIOR CERVICAL DECOMPRESSION/DISCECTOMY FUSION, INTERBODY PROSTHESIS, PLATE SCREWS CERVICAL 5- CERVICAL 6 (N/A ) - ANTERIOR CERVICAL DECOMPRESSION/DISCECTOMY FUSION, INTERBODY PROSTHESIS, PLATE SCREWS CERVICAL 5- CERVICAL 6   Anesthesia type:  General   Pre-op diagnosis:  CERVICAL SPONDYLOSIS WITH MYELOPATHY AND RADICULOPATHY   Location:  [facility] OR ROOM 19 / [facility]   Surgeon:  Newman Pies, MD      DISCUSSION: 72 yo male current smoker. Pertinent hx incldues Afib (s/p ablation x 2 2007, 2009), HTN, TIA, ETOH abuse, COPD (patient denies).  Pt's primary cardiologist is Dr. Wynonia Lawman, however he is currently on leave. He was seen by Dr. Geraldo Pitter for preop clearance. Per telephone encounter by Dr. Geraldo Pitter 07/18/18 "This gentleman has had a stress test and echocardiogram which are unremarkable.  Based on these findings he is not at high risk according to my evaluation note and consultation".  There was some confusion regarding when to stop his blood thinner. Per Dr. Julien Nordmann progress note 07/12/18, if his recent weakness was due to cervical cord compression then he can stop his blood thinner with minimal risk. If, however, the weakness was due to a CVA then he should be bridged. He deferred the decision to Dr. Arnoldo Morale. Per Dr. Cam Hai consult note 06/26/18 "He began having neck pain and numbness on approximately 06/22/2018.  The patient was admitted for a stroke work-up.  The work-up was negative." Pt reported at PAT that he was instructed to stop Xarelto 5-7d preop.  Anticipate he can proceed as planned barring acute status change.  VS: BP 117/61   Pulse (!) 57   Temp (!) 36.4 C (Oral)   Resp 20   Ht 6\' 2"  (1.88 m)   Wt 88.4 kg   SpO2 100%   BMI 25.01 kg/m   PROVIDERS: Tisovec, Fransico Him, MD is PCP  Ezzard Standing, MD is Cardiologist. He was seen by Dr. Geraldo Pitter for preop clearance while Dr. Wynonia Lawman is  out on leave.   LABS: Labs reviewed: Acceptable for surgery. Mildly elevated transaminases, appears stable. (all labs ordered are listed, but only abnormal results are displayed)  Labs Reviewed  SURGICAL PCR SCREEN - Abnormal; Notable for the following components:      Result Value   Staphylococcus aureus POSITIVE (*)    All other components within normal limits  COMPREHENSIVE METABOLIC PANEL - Abnormal; Notable for the following components:   Glucose, Bld 100 (*)    AST 49 (*)    ALT 64 (*)    All other components within normal limits  CBC - Abnormal; Notable for the following components:   MCV 100.2 (*)    All other components within normal limits  TYPE AND SCREEN  ABO/RH     IMAGES: MR Cervical spine 06/25/2018: IMPRESSION: At C5-6, there is spondylosis with a central disc herniation with caudal down turning behind upper C6. Canal stenosis with AP diameter of 8.2 mm. Some cord deformity, possibly with early abnormal T2 signal within the cord. Bilateral foraminal stenosis could compress either C6 nerve.  At C4-5, there is a central disc herniation that effaces the ventral subarachnoid space and contacts the ventral cord but does not appear to cause cord compression. Foramina widely patent.  C3-4: Disc bulge. Right-sided facet arthropathy. Right foraminal narrowing could possibly affect the right C4 nerve.  EKG: 07/12/2018:  Sinus rhythm with 1st degree AV block. Rate 61 bpm.  CV: Lexiscan stress  07/16/2018:  Nuclear stress EF: 64%.  There was no ST segment deviation noted during stress.  No T wave inversion was noted during stress.  The study is normal.  This is a low risk study.  The left ventricular ejection fraction is normal (55-65%).  TTE 06/25/2018: - Left ventricle: The cavity size was normal. Wall thickness was   normal. Systolic function was normal. The estimated ejection   fraction was in the range of 55% to 60%. Wall motion was normal;    there were no regional wall motion abnormalities. Doppler   parameters are consistent with abnormal left ventricular   relaxation (grade 1 diastolic dysfunction). - Mitral valve: Mild focal calcification of the anterior leaflet.    Past Medical History:  Diagnosis Date  . Afib [facility])    s/p ablation x2 by Dr [name] at [facility] in 2007, 2009  . Alcohol abuse   . 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 COPD (chronic obstructive pulmonary disease) ([place])    pt denies  . CVA (cerebral infarction)   . Hyperlipidemia   . Hypertension   . Palpitations   . Skin cancer--melanoma   . Stroke [facility])    TIA    Past Surgical History:  Procedure Laterality Date  . APPENDECTOMY    . ATRIAL FIBRILLATION ABLATION  2007 ,2009   Dr [name] at [facility]     right clavicle    MEDICATIONS: . acetaminophen (TYLENOL) 500 MG tablet  . aspirin EC 81 MG EC tablet  . atorvastatin (LIPITOR) 80 MG tablet  . enalapril (VASOTEC) 20 MG tablet  . ferrous sulfate 325 (65 FE) MG tablet  . flecainide (TAMBOCOR) 50 MG tablet  . metoprolol (LOPRESSOR) 50 MG tablet  . Multiple Vitamin (MULTIVITAMIN WITH MINERALS) TABS tablet  . omeprazole (PRILOSEC) 20 MG capsule  . SUPER B COMPLEX/C CAPS  . verapamil (CALAN) 120 MG tablet  . XARELTO 20 MG TABS tablet   No current facility-administered medications for this encounter.     [name] [facility] Short Stay Center/Anesthesiology Phone [phone] 07/22/2018 12:10 PM

## 2018-07-22 NOTE — Anesthesia Preprocedure Evaluation (Addendum)
Anesthesia Evaluation  Patient identified by MRN, date of birth, ID band Patient awake    Reviewed: Allergy & Precautions, NPO status , Patient's Chart, lab work & pertinent test results  Airway Mallampati: I  TM Distance: >3 FB Neck ROM: Full    Dental   Pulmonary Current Smoker,    Pulmonary exam normal        Cardiovascular hypertension, Pt. on medications Normal cardiovascular exam+ dysrhythmias Atrial Fibrillation      Neuro/Psych CVA    GI/Hepatic   Endo/Other    Renal/GU      Musculoskeletal   Abdominal   Peds  Hematology   Anesthesia Other Findings   Reproductive/Obstetrics                            Anesthesia Physical Anesthesia Plan  ASA: III  Anesthesia Plan: General   Post-op Pain Management:    Induction: Intravenous  PONV Risk Score and Plan: 1 and Ondansetron, Midazolam and Dexamethasone  Airway Management Planned: Oral ETT  Additional Equipment:   Intra-op Plan:   Post-operative Plan: Extubation in OR  Informed Consent: I have reviewed the patients History and Physical, chart, labs and discussed the procedure including the risks, benefits and alternatives for the proposed anesthesia with the patient or authorized representative who has indicated his/her understanding and acceptance.     Plan Discussed with: CRNA and Surgeon  Anesthesia Plan Comments: (See PAT note 07/19/2018 by Karoline Caldwell, PA-C )       Anesthesia Quick Evaluation

## 2018-07-24 ENCOUNTER — Observation Stay (HOSPITAL_COMMUNITY)
Admission: RE | Admit: 2018-07-24 | Discharge: 2018-07-25 | Disposition: A | Discharge status: Home / Self Care | Payer: Medicare Other | Source: Ambulatory Visit | Attending: Neurosurgery | Admitting: Neurosurgery

## 2018-07-24 ENCOUNTER — Ambulatory Visit (HOSPITAL_COMMUNITY): Payer: Medicare Other | Admitting: Physician Assistant

## 2018-07-24 ENCOUNTER — Ambulatory Visit (HOSPITAL_COMMUNITY): Payer: Medicare Other | Admitting: Anesthesiology

## 2018-07-24 ENCOUNTER — Other Ambulatory Visit: Payer: Self-pay

## 2018-07-24 ENCOUNTER — Encounter (HOSPITAL_COMMUNITY): Payer: Self-pay

## 2018-07-24 ENCOUNTER — Encounter (HOSPITAL_COMMUNITY)
Admission: RE | Disposition: A | Discharge status: Home / Self Care | Payer: Self-pay | Source: Ambulatory Visit | Attending: Neurosurgery

## 2018-07-24 ENCOUNTER — Ambulatory Visit (HOSPITAL_COMMUNITY): Payer: Medicare Other

## 2018-07-24 DIAGNOSIS — M50122 Cervical disc disorder at C5-C6 level with radiculopathy: Secondary | ICD-10-CM | POA: Diagnosis not present

## 2018-07-24 DIAGNOSIS — I4891 Unspecified atrial fibrillation: Secondary | ICD-10-CM | POA: Diagnosis not present

## 2018-07-24 DIAGNOSIS — Z85820 Personal history of malignant melanoma of skin: Secondary | ICD-10-CM | POA: Insufficient documentation

## 2018-07-24 DIAGNOSIS — Z79899 Other long term (current) drug therapy: Secondary | ICD-10-CM | POA: Insufficient documentation

## 2018-07-24 DIAGNOSIS — Z7982 Long term (current) use of aspirin: Secondary | ICD-10-CM | POA: Insufficient documentation

## 2018-07-24 DIAGNOSIS — M50022 Cervical disc disorder at C5-C6 level with myelopathy: Secondary | ICD-10-CM | POA: Diagnosis not present

## 2018-07-24 DIAGNOSIS — Z7901 Long term (current) use of anticoagulants: Secondary | ICD-10-CM | POA: Insufficient documentation

## 2018-07-24 DIAGNOSIS — Z8673 Personal history of transient ischemic attack (TIA), and cerebral infarction without residual deficits: Secondary | ICD-10-CM | POA: Diagnosis not present

## 2018-07-24 DIAGNOSIS — M5000 Cervical disc disorder with myelopathy, unspecified cervical region: Secondary | ICD-10-CM | POA: Diagnosis present

## 2018-07-24 DIAGNOSIS — I10 Essential (primary) hypertension: Secondary | ICD-10-CM | POA: Insufficient documentation

## 2018-07-24 DIAGNOSIS — J449 Chronic obstructive pulmonary disease, unspecified: Secondary | ICD-10-CM | POA: Insufficient documentation

## 2018-07-24 DIAGNOSIS — Z419 Encounter for procedure for purposes other than remedying health state, unspecified: Secondary | ICD-10-CM

## 2018-07-24 HISTORY — PX: ANTERIOR CERVICAL DECOMP/DISCECTOMY FUSION: SHX1161

## 2018-07-24 SURGERY — ANTERIOR CERVICAL DECOMPRESSION/DISCECTOMY FUSION 1 LEVEL
Anesthesia: General | Site: Spine Cervical

## 2018-07-24 MED ORDER — FERROUS SULFATE 325 (65 FE) MG PO TABS
325.0000 mg | ORAL_TABLET | Freq: Every day | ORAL | Status: DC
  Administered 2018-07-25: 325 mg via ORAL
  Filled 2018-07-24: qty 1

## 2018-07-24 MED ORDER — ONDANSETRON HCL 4 MG/2ML IJ SOLN: 4.0000 mg | Freq: Four times a day (QID) | INTRAMUSCULAR | Status: DC | PRN

## 2018-07-24 MED ORDER — LACTATED RINGERS IV SOLN: INTRAVENOUS | Status: DC

## 2018-07-24 MED ORDER — CEFAZOLIN SODIUM-DEXTROSE 2-4 GM/100ML-% IV SOLN
2.0000 g | Freq: Three times a day (TID) | INTRAVENOUS | Status: AC
  Administered 2018-07-24 – 2018-07-25 (×2): 2 g via INTRAVENOUS
  Filled 2018-07-24 (×2): qty 100

## 2018-07-24 MED ORDER — SUGAMMADEX SODIUM 200 MG/2ML IV SOLN
INTRAVENOUS | Status: AC
  Filled 2018-07-24: qty 2

## 2018-07-24 MED ORDER — ONDANSETRON HCL 4 MG/2ML IJ SOLN
INTRAMUSCULAR | Status: DC | PRN
  Administered 2018-07-24: 4 mg via INTRAVENOUS

## 2018-07-24 MED ORDER — OXYCODONE HCL 5 MG PO TABS: 5.0000 mg | ORAL_TABLET | ORAL | Status: DC | PRN

## 2018-07-24 MED ORDER — ADULT MULTIVITAMIN W/MINERALS CH
1.0000 | ORAL_TABLET | Freq: Every day | ORAL | Status: DC
  Administered 2018-07-24 – 2018-07-25 (×2): 1 via ORAL
  Filled 2018-07-24 (×2): qty 1

## 2018-07-24 MED ORDER — ONDANSETRON HCL 4 MG/2ML IJ SOLN
INTRAMUSCULAR | Status: AC
  Filled 2018-07-24: qty 2

## 2018-07-24 MED ORDER — MIDAZOLAM HCL 2 MG/2ML IJ SOLN
INTRAMUSCULAR | Status: AC
  Filled 2018-07-24: qty 2

## 2018-07-24 MED ORDER — MIDAZOLAM HCL 2 MG/2ML IJ SOLN
INTRAMUSCULAR | Status: DC | PRN
  Administered 2018-07-24: 2 mg via INTRAVENOUS

## 2018-07-24 MED ORDER — BUPIVACAINE-EPINEPHRINE 0.5% -1:200000 IJ SOLN
INTRAMUSCULAR | Status: AC
  Filled 2018-07-24: qty 1

## 2018-07-24 MED ORDER — ONDANSETRON HCL 4 MG PO TABS: 4.0000 mg | ORAL_TABLET | Freq: Four times a day (QID) | ORAL | Status: DC | PRN

## 2018-07-24 MED ORDER — LACTATED RINGERS IV SOLN
INTRAVENOUS | Status: DC
  Administered 2018-07-24 (×2): via INTRAVENOUS

## 2018-07-24 MED ORDER — LIDOCAINE 2% (20 MG/ML) 5 ML SYRINGE
INTRAMUSCULAR | Status: AC
  Filled 2018-07-24: qty 5

## 2018-07-24 MED ORDER — CEFAZOLIN SODIUM-DEXTROSE 2-4 GM/100ML-% IV SOLN
2.0000 g | INTRAVENOUS | Status: AC
  Administered 2018-07-24: 2 g via INTRAVENOUS
  Filled 2018-07-24: qty 100

## 2018-07-24 MED ORDER — FENTANYL CITRATE (PF) 250 MCG/5ML IJ SOLN
INTRAMUSCULAR | Status: DC | PRN
  Administered 2018-07-24: 50 ug via INTRAVENOUS
  Administered 2018-07-24: 150 ug via INTRAVENOUS
  Administered 2018-07-24 (×2): 100 ug via INTRAVENOUS
  Administered 2018-07-24 (×2): 50 ug via INTRAVENOUS

## 2018-07-24 MED ORDER — THROMBIN 5000 UNITS EX SOLR
OROMUCOSAL | Status: DC | PRN
  Administered 2018-07-24: 17:00:00

## 2018-07-24 MED ORDER — PANTOPRAZOLE SODIUM 40 MG IV SOLR: 40.0000 mg | Freq: Every day | INTRAVENOUS | Status: DC

## 2018-07-24 MED ORDER — FENTANYL CITRATE (PF) 250 MCG/5ML IJ SOLN
INTRAMUSCULAR | Status: AC
  Filled 2018-07-24: qty 5

## 2018-07-24 MED ORDER — DOCUSATE SODIUM 100 MG PO CAPS
100.0000 mg | ORAL_CAPSULE | Freq: Two times a day (BID) | ORAL | Status: DC
  Administered 2018-07-24 – 2018-07-25 (×2): 100 mg via ORAL
  Filled 2018-07-24 (×2): qty 1

## 2018-07-24 MED ORDER — ATORVASTATIN CALCIUM 20 MG PO TABS
80.0000 mg | ORAL_TABLET | Freq: Every evening | ORAL | Status: DC
  Administered 2018-07-24: 80 mg via ORAL
  Filled 2018-07-24: qty 4

## 2018-07-24 MED ORDER — ROCURONIUM BROMIDE 10 MG/ML (PF) SYRINGE
PREFILLED_SYRINGE | INTRAVENOUS | Status: DC | PRN
  Administered 2018-07-24: 50 mg via INTRAVENOUS

## 2018-07-24 MED ORDER — ACETAMINOPHEN 500 MG PO TABS
1000.0000 mg | ORAL_TABLET | Freq: Four times a day (QID) | ORAL | Status: DC
  Administered 2018-07-24 – 2018-07-25 (×3): 1000 mg via ORAL
  Filled 2018-07-24 (×3): qty 2

## 2018-07-24 MED ORDER — VERAPAMIL HCL 120 MG PO TABS
120.0000 mg | ORAL_TABLET | ORAL | Status: DC | PRN
  Administered 2018-07-25: 120 mg via ORAL
  Filled 2018-07-24 (×2): qty 1

## 2018-07-24 MED ORDER — SODIUM CHLORIDE 0.9 % IV SOLN
INTRAVENOUS | Status: DC | PRN
  Administered 2018-07-24: 17:00:00

## 2018-07-24 MED ORDER — MORPHINE SULFATE (PF) 4 MG/ML IV SOLN: 4.0000 mg | INTRAVENOUS | Status: DC | PRN

## 2018-07-24 MED ORDER — LIDOCAINE 2% (20 MG/ML) 5 ML SYRINGE
INTRAMUSCULAR | Status: DC | PRN
  Administered 2018-07-24: 100 mg via INTRAVENOUS

## 2018-07-24 MED ORDER — SUGAMMADEX SODIUM 200 MG/2ML IV SOLN
INTRAVENOUS | Status: DC | PRN
  Administered 2018-07-24: 400 mg via INTRAVENOUS

## 2018-07-24 MED ORDER — ONDANSETRON HCL 4 MG/2ML IJ SOLN: 4.0000 mg | Freq: Once | INTRAMUSCULAR | Status: DC | PRN

## 2018-07-24 MED ORDER — PHENOL 1.4 % MT LIQD: 1.0000 | OROMUCOSAL | Status: DC | PRN

## 2018-07-24 MED ORDER — ACETAMINOPHEN 650 MG RE SUPP: 650.0000 mg | RECTAL | Status: DC | PRN

## 2018-07-24 MED ORDER — DEXAMETHASONE 4 MG PO TABS
4.0000 mg | ORAL_TABLET | Freq: Four times a day (QID) | ORAL | Status: AC
  Administered 2018-07-24: 4 mg via ORAL
  Filled 2018-07-24: qty 1

## 2018-07-24 MED ORDER — MENTHOL 3 MG MT LOZG: 1.0000 | LOZENGE | OROMUCOSAL | Status: DC | PRN

## 2018-07-24 MED ORDER — 0.9 % SODIUM CHLORIDE (POUR BTL) OPTIME
TOPICAL | Status: DC | PRN
  Administered 2018-07-24: 1000 mL

## 2018-07-24 MED ORDER — MEPERIDINE HCL 50 MG/ML IJ SOLN: 6.2500 mg | INTRAMUSCULAR | Status: DC | PRN

## 2018-07-24 MED ORDER — DEXAMETHASONE SODIUM PHOSPHATE 10 MG/ML IJ SOLN
INTRAMUSCULAR | Status: DC | PRN
  Administered 2018-07-24: 10 mg via INTRAVENOUS

## 2018-07-24 MED ORDER — PROPOFOL 10 MG/ML IV BOLUS
INTRAVENOUS | Status: DC | PRN
  Administered 2018-07-24: 120 mg via INTRAVENOUS

## 2018-07-24 MED ORDER — PROPOFOL 10 MG/ML IV BOLUS
INTRAVENOUS | Status: AC
  Filled 2018-07-24: qty 20

## 2018-07-24 MED ORDER — OXYCODONE HCL 5 MG PO TABS
10.0000 mg | ORAL_TABLET | ORAL | Status: DC | PRN
  Filled 2018-07-24: qty 2

## 2018-07-24 MED ORDER — BACITRACIN ZINC 500 UNIT/GM EX OINT
TOPICAL_OINTMENT | CUTANEOUS | Status: DC | PRN
  Administered 2018-07-24: 1 via TOPICAL

## 2018-07-24 MED ORDER — BUPIVACAINE-EPINEPHRINE (PF) 0.5% -1:200000 IJ SOLN
INTRAMUSCULAR | Status: DC | PRN
  Administered 2018-07-24: 10 mL

## 2018-07-24 MED ORDER — FLECAINIDE ACETATE 50 MG PO TABS
50.0000 mg | ORAL_TABLET | Freq: Two times a day (BID) | ORAL | Status: DC
  Administered 2018-07-24 – 2018-07-25 (×2): 50 mg via ORAL
  Filled 2018-07-24 (×3): qty 1

## 2018-07-24 MED ORDER — METOPROLOL TARTRATE 25 MG PO TABS
50.0000 mg | ORAL_TABLET | Freq: Two times a day (BID) | ORAL | Status: DC
  Administered 2018-07-24 – 2018-07-25 (×2): 50 mg via ORAL
  Filled 2018-07-24 (×2): qty 2

## 2018-07-24 MED ORDER — HYDROMORPHONE HCL 1 MG/ML IJ SOLN: 0.2500 mg | INTRAMUSCULAR | Status: DC | PRN

## 2018-07-24 MED ORDER — DEXAMETHASONE SODIUM PHOSPHATE 10 MG/ML IJ SOLN
INTRAMUSCULAR | Status: AC
  Filled 2018-07-24: qty 1

## 2018-07-24 MED ORDER — BISACODYL 10 MG RE SUPP: 10.0000 mg | Freq: Every day | RECTAL | Status: DC | PRN

## 2018-07-24 MED ORDER — ALUM & MAG HYDROXIDE-SIMETH 200-200-20 MG/5ML PO SUSP: 30.0000 mL | Freq: Four times a day (QID) | ORAL | Status: DC | PRN

## 2018-07-24 MED ORDER — VECURONIUM BROMIDE 10 MG IV SOLR
INTRAVENOUS | Status: AC
  Filled 2018-07-24: qty 10

## 2018-07-24 MED ORDER — BACITRACIN ZINC 500 UNIT/GM EX OINT
TOPICAL_OINTMENT | CUTANEOUS | Status: AC
  Filled 2018-07-24: qty 28.35

## 2018-07-24 MED ORDER — CHLORHEXIDINE GLUCONATE CLOTH 2 % EX PADS: 6.0000 | MEDICATED_PAD | Freq: Once | CUTANEOUS | Status: DC

## 2018-07-24 MED ORDER — DEXAMETHASONE SODIUM PHOSPHATE 4 MG/ML IJ SOLN
4.0000 mg | Freq: Four times a day (QID) | INTRAMUSCULAR | Status: AC
  Administered 2018-07-24: 4 mg via INTRAVENOUS
  Filled 2018-07-24: qty 1

## 2018-07-24 MED ORDER — HYDROMORPHONE HCL 1 MG/ML IJ SOLN
INTRAMUSCULAR | Status: DC | PRN
  Administered 2018-07-24: 0.5 mg via INTRAVENOUS

## 2018-07-24 MED ORDER — THROMBIN 5000 UNITS EX SOLR
CUTANEOUS | Status: AC
  Filled 2018-07-24: qty 5000

## 2018-07-24 MED ORDER — VECURONIUM BROMIDE 10 MG IV SOLR
INTRAVENOUS | Status: DC | PRN
  Administered 2018-07-24: 2 mg via INTRAVENOUS
  Administered 2018-07-24: 3 mg via INTRAVENOUS
  Administered 2018-07-24: 1 mg via INTRAVENOUS

## 2018-07-24 MED ORDER — ENALAPRIL MALEATE 20 MG PO TABS
20.0000 mg | ORAL_TABLET | Freq: Every day | ORAL | Status: DC
  Administered 2018-07-24: 20 mg via ORAL
  Filled 2018-07-24: qty 1

## 2018-07-24 MED ORDER — CYCLOBENZAPRINE HCL 10 MG PO TABS
10.0000 mg | ORAL_TABLET | Freq: Three times a day (TID) | ORAL | Status: DC | PRN
  Administered 2018-07-24: 10 mg via ORAL
  Filled 2018-07-24: qty 1

## 2018-07-24 MED ORDER — ACETAMINOPHEN 325 MG PO TABS: 650.0000 mg | ORAL_TABLET | ORAL | Status: DC | PRN

## 2018-07-24 MED ORDER — PANTOPRAZOLE SODIUM 40 MG PO TBEC
40.0000 mg | DELAYED_RELEASE_TABLET | Freq: Every day | ORAL | Status: DC
  Administered 2018-07-24 – 2018-07-25 (×2): 40 mg via ORAL
  Filled 2018-07-24 (×2): qty 1

## 2018-07-24 MED ORDER — HYDROMORPHONE HCL 1 MG/ML IJ SOLN
INTRAMUSCULAR | Status: AC
  Filled 2018-07-24: qty 0.5

## 2018-07-24 SURGICAL SUPPLY — 58 items
BAG DECANTER FOR FLEXI CONT (MISCELLANEOUS) ×3 IMPLANT
BENZOIN TINCTURE PRP APPL 2/3 (GAUZE/BANDAGES/DRESSINGS) ×3 IMPLANT
BIT DRILL NEURO 2X3.1 SFT TUCH (MISCELLANEOUS) ×1 IMPLANT
BLADE SURG 15 STRL LF DISP TIS (BLADE) ×1 IMPLANT
BLADE SURG 15 STRL SS (BLADE) ×2
BLADE ULTRA TIP 2M (BLADE) ×3 IMPLANT
BUR BARREL STRAIGHT FLUTE 4.0 (BURR) ×3 IMPLANT
BUR MATCHSTICK NEURO 3.0 LAGG (BURR) ×3 IMPLANT
CANISTER SUCT 3000ML PPV (MISCELLANEOUS) ×3 IMPLANT
CARTRIDGE OIL MAESTRO DRILL (MISCELLANEOUS) ×1 IMPLANT
CLOSURE WOUND 1/2 X4 (GAUZE/BANDAGES/DRESSINGS) ×1
COVER MAYO STAND STRL (DRAPES) ×3 IMPLANT
COVER WAND RF STERILE (DRAPES) ×3 IMPLANT
DECANTER SPIKE VIAL GLASS SM (MISCELLANEOUS) ×3 IMPLANT
DIFFUSER DRILL AIR PNEUMATIC (MISCELLANEOUS) ×3 IMPLANT
DRAPE LAPAROTOMY 100X72 PEDS (DRAPES) ×3 IMPLANT
DRAPE MICROSCOPE LEICA (MISCELLANEOUS) IMPLANT
DRAPE POUCH INSTRU U-SHP 10X18 (DRAPES) ×3 IMPLANT
DRAPE SURG 17X23 STRL (DRAPES) ×6 IMPLANT
DRILL NEURO 2X3.1 SOFT TOUCH (MISCELLANEOUS) ×3
DRSG OPSITE POSTOP 4X6 (GAUZE/BANDAGES/DRESSINGS) ×3 IMPLANT
ELECT REM PT RETURN 9FT ADLT (ELECTROSURGICAL) ×3
ELECTRODE REM PT RTRN 9FT ADLT (ELECTROSURGICAL) ×1 IMPLANT
GAUZE 4X4 16PLY RFD (DISPOSABLE) IMPLANT
GAUZE SPONGE 4X4 12PLY STRL (GAUZE/BANDAGES/DRESSINGS) ×3 IMPLANT
GLOVE BIO SURGEON STRL SZ8 (GLOVE) ×3 IMPLANT
GLOVE BIO SURGEON STRL SZ8.5 (GLOVE) ×3 IMPLANT
GLOVE EXAM NITRILE XL STR (GLOVE) IMPLANT
GOWN STRL REUS W/ TWL LRG LVL3 (GOWN DISPOSABLE) IMPLANT
GOWN STRL REUS W/ TWL XL LVL3 (GOWN DISPOSABLE) ×1 IMPLANT
GOWN STRL REUS W/TWL LRG LVL3 (GOWN DISPOSABLE)
GOWN STRL REUS W/TWL XL LVL3 (GOWN DISPOSABLE) ×2
HEMOSTAT POWDER KIT SURGIFOAM (HEMOSTASIS) ×3 IMPLANT
KIT BASIN OR (CUSTOM PROCEDURE TRAY) ×3 IMPLANT
KIT TURNOVER KIT B (KITS) ×3 IMPLANT
MARKER SKIN DUAL TIP RULER LAB (MISCELLANEOUS) ×3 IMPLANT
NEEDLE HYPO 22GX1.5 SAFETY (NEEDLE) ×3 IMPLANT
NEEDLE SPNL 18GX3.5 QUINCKE PK (NEEDLE) ×3 IMPLANT
NS IRRIG 1000ML POUR BTL (IV SOLUTION) ×3 IMPLANT
OIL CARTRIDGE MAESTRO DRILL (MISCELLANEOUS) ×3
PACK LAMINECTOMY NEURO (CUSTOM PROCEDURE TRAY) ×3 IMPLANT
PEEK VISTA 14X14X7MM (Peek) ×3 IMPLANT
PEEK VISTA 14X14X8MM (Peek) ×3 IMPLANT
PIN DISTRACTION 14MM (PIN) ×6 IMPLANT
PLATE ANT CERV XTEND ELD 1 L14 (Plate) ×3 IMPLANT
PUTTY BIOACTIVE 2CC KINEX (Miscellaneous) ×3 IMPLANT
RUBBERBAND STERILE (MISCELLANEOUS) IMPLANT
SCREW MULTI AXIAL 4.0X10 (Screw) ×3 IMPLANT
SCREW XTD VAR 4.2 SELF TAP (Screw) ×12 IMPLANT
SPONGE INTESTINAL PEANUT (DISPOSABLE) ×6 IMPLANT
SPONGE SURGIFOAM ABS GEL SZ50 (HEMOSTASIS) IMPLANT
STRIP CLOSURE SKIN 1/2X4 (GAUZE/BANDAGES/DRESSINGS) ×2 IMPLANT
SUT VIC AB 0 CT1 27 (SUTURE) ×2
SUT VIC AB 0 CT1 27XBRD ANTBC (SUTURE) ×1 IMPLANT
SUT VIC AB 3-0 SH 8-18 (SUTURE) ×3 IMPLANT
TOWEL GREEN STERILE (TOWEL DISPOSABLE) ×3 IMPLANT
TOWEL GREEN STERILE FF (TOWEL DISPOSABLE) ×3 IMPLANT
WATER STERILE IRR 1000ML POUR (IV SOLUTION) ×3 IMPLANT

## 2018-07-24 NOTE — Op Note (Signed)
Brief history: The patient is a 72 year old white male who has complained of neck pain with right arm pain numbness tingling and weakness.  He was worked up with a cervical MRI which demonstrated a C5-6 herniated disks with spinal cord signal change and stenosis.  I discussed the various treatment options with the patient.  He has decided to proceed with surgery after weighing the risks, benefits and alternatives.  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 Earle Gell  Asst.: Arnetha Massy, nurse practitioner  Anesthesia: Gen. endotracheal  Estimated blood loss: 50 cc  Drains: None  Complications: None  Description of procedure: The patient was brought to the operating room by the anesthesia team. General endotracheal anesthesia was induced. A roll was placed under the patient's shoulders to keep the neck in the neutral position. The patient's anterior cervical region was then prepared with Betadine scrub and Betadine solution. Sterile drapes were applied.  The area to be incised was then injected with Marcaine with epinephrine solution. I then used a scalpel to make a transverse incision in the patient's left anterior neck. I used the Metzenbaum scissors to divide the platysmal muscle and then to dissect medial to the sternocleidomastoid muscle, jugular vein, and carotid artery. I carefully dissected down towards the anterior cervical spine identifying the esophagus and retracting it medially. Then using Kitner swabs to clear soft tissue from the anterior cervical  spine. We then inserted a bent spinal needle into the upper exposed intervertebral disc space. We then obtained intraoperative radiographs confirm our location.  I then used electrocautery to detach the medial border of the longus colli muscle bilaterally from the C5-6 intervertebral disc spaces. I then inserted the Caspar self-retaining retractor underneath the longus colli muscle bilaterally to provide exposure.  We then incised the intervertebral disc at C5-6. We then performed a partial intervertebral discectomy with a pituitary forceps and the Karlin curettes. I then inserted distraction screws into the vertebral bodies at C5-6. We then distracted the interspace. We then used the high-speed drill to decorticate the vertebral endplates at A4-1, to drill away the remainder of the intervertebral disc, to drill away some posterior spondylosis, and to thin out the posterior longitudinal ligament. I then incised ligament with the arachnoid knife. We then removed the ligament with a Kerrison punches undercutting the vertebral endplates and decompressing the thecal sac. We then performed foraminotomies about the bilateral C6 nerve roots. This completed the decompression at this level.  We now turned our to attention to the interbody fusion. We used the trial spacers to determine the appropriate size for the interbody prosthesis. We then pre-filled prosthesis with a combination of local morcellized autograft bone that we obtained during decompression as well as Kinnex bone graft extender. We then inserted the prosthesis into the distracted interspace at C5-6. We then removed the distraction screws. There was a good snug fit of the prosthesis in the interspace.  Having completed the fusion we now turned attention to the anterior spinal instrumentation. We used the high-speed drill to drill away some anterior spondylosis at the disc spaces so that the plate lay down flat. We selected the appropriate length  titanium anterior cervical plate. We laid it along the  anterior aspect of the vertebral bodies from C5-6. We then drilled 14 mm holes at C5 and C6. We then secured the plate to the vertebral bodies by placing two 14 mm self-tapping screws at C5 and C6. We then obtained intraoperative radiograph. The demonstrating good position of the instrumentation. We therefore secured the screws the plate the locking each cam. This completed the instrumentation.  We then obtained hemostasis using bipolar electrocautery. We irrigated the wound out with bacitracin solution. We then removed the retractor. We inspected the esophagus for any damage. There was none apparent. We then reapproximated patient's platysmal muscle with interrupted 3-0 Vicryl suture. We then reapproximated the subcutaneous tissue with interrupted 3-0 Vicryl suture. The skin was reapproximated with Steri-Strips and benzoin. The wound was then covered with bacitracin ointment. A sterile dressing was applied. The drapes were removed. Patient was subsequently extubated by the anesthesia team and transported to the post anesthesia care unit in stable condition. All sponge instrument and needle counts were reportedly correct at the end of this case.

## 2018-07-24 NOTE — H&P (Signed)
Subjective: The patient is a 72 year old white male who complained of neck pain and right upper extremity numbness and weakness.  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  I discussed the various treatment options with him he has decided to proceed with surgery.  He is on Xarelto for A. fib and has stopped this for surgery.  Past Medical History:  Diagnosis Date  . Afib [facility])    s/p ablation x2 by Dr [name] at [facility] in 2007, 2009  . Alcohol abuse   . 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 COPD (chronic obstructive pulmonary disease) ([place])    pt denies  . CVA (cerebral infarction)   . Hyperlipidemia   . Hypertension   . Palpitations   . Skin cancer--melanoma   . Stroke [facility])    TIA    Past Surgical History:  Procedure Laterality Date  . APPENDECTOMY    . ATRIAL FIBRILLATION ABLATION  2007 ,2009   Dr [name] at [place]     right clavicle    No Known Allergies  Social History   Tobacco Use  . Smoking status: Current Every Day Smoker    Packs/day: 1.00    Years: 35.00    Pack years: 35.00    Types: Cigarettes  . Smokeless tobacco: Never Used  . Tobacco comment: Smokes less than 1 ppd  Substance Use Topics  . Alcohol use: Yes    Comment: heavy, up to 6 beers per day    Family History  Problem Relation Age of Onset  . Cancer Unknown   . Colon cancer Neg Hx   . Esophageal cancer Neg Hx   . Rectal cancer Neg Hx   . Stomach cancer Neg Hx    Prior to Admission medications   Medication Sig Start Date End Date Taking? Authorizing Provider  acetaminophen (TYLENOL) 500 MG tablet Take 1,000 mg by mouth every 6 (six) hours as needed for moderate pain or headache.   Yes [provider]  atorvastatin (LIPITOR) 80 MG tablet Take 80 mg by mouth every evening.  01/26/14  Yes [provider]  enalapril (VASOTEC) 20 MG tablet Take 20 mg by mouth at bedtime.  12/01/13  Yes [provider]   ferrous sulfate 325 (65 FE) MG tablet Take 325 mg by mouth daily with breakfast.   Yes [provider]  flecainide (TAMBOCOR) 50 MG tablet Take 1 tablet (50 mg total) by mouth 2 (two) times daily. 07/23/15  Yes Allred, Jeneen Rinks, MD  metoprolol (LOPRESSOR) 50 MG tablet Take 50 mg by mouth 2 (two) times daily.  12/01/13  Yes [provider]  Multiple Vitamin (MULTIVITAMIN WITH MINERALS) TABS tablet Take 1 tablet by mouth daily.   Yes [provider]  omeprazole (PRILOSEC) 20 MG capsule Take 20 mg by mouth daily at 12 noon.  12/01/13  Yes [provider]  SUPER B COMPLEX/C CAPS Take 1 capsule by mouth daily.   Yes [provider]  verapamil (CALAN) 120 MG tablet Take 120 mg by mouth as needed (afib).    Yes [provider]  XARELTO 20 MG TABS tablet Take 20 mg by mouth daily at 6 PM.  01/26/14  Yes [provider]  aspirin EC 81 MG EC tablet Take 1 tablet (81 mg total) by mouth daily. Patient not taking: Reported on 07/15/2018 06/27/18   Valinda Party, DO     Review of Systems  Positive ROS: As above  All other systems have been reviewed and were otherwise negative with the exception of those mentioned in the HPI and as above.  Objective: Vital signs in last 24 hours: Temp:  [97.5 F (36.4 C)] 97.5 F (36.4 C) (11/13 1223) Pulse Rate:  [68] 68 (11/13 1223) Resp:  [20] 20 (11/13 1223) BP: (161)/(62) 161/62 (11/13 1223) SpO2:  [100 %] 100 % (11/13 1223) Weight:  [88.4 kg] 88.4 kg (11/13 1244) Estimated body mass index is 25.01 kg/m as calculated from the following:   Height as of this encounter: 6\' 2"  (1.88 m).   Weight as of this encounter: 88.4 kg.   General Appearance: Alert Head: Normocephalic, without obvious abnormality, atraumatic Eyes: PERRL, conjunctiva/corneas clear, EOM's intact,    Ears: Normal  Throat: Normal  Neck: Supple, Back: unremarkable Lungs: Clear to auscultation bilaterally, respirations  unlabored Heart: Regular rate and rhythm, no murmur, rub or gallop Abdomen: Soft, non-tender Extremities: Extremities normal, atraumatic, no cyanosis or edema Skin: unremarkable  NEUROLOGIC:   Mental status: alert and oriented,Motor Exam -he has weakness in his right hand Sensory Exam - grossly normal Reflexes:  Coordination - grossly normal Gait - grossly normal Balance - grossly normal Cranial Nerves: I: smell Not tested  II: visual acuity  OS: Normal  OD: Normal   II: visual fields Full to confrontation  II: pupils Equal, round, reactive to light  III,VII: ptosis None  III,IV,VI: extraocular muscles  Full ROM  V: mastication Normal  V: facial light touch sensation  Normal  V,VII: corneal reflex  Present  VII: facial muscle function - upper  Normal  VII: facial muscle function - lower Normal  VIII: hearing Not tested  IX: soft palate elevation  Normal  IX,X: gag reflex Present  XI: trapezius strength  5/5  XI: sternocleidomastoid strength 5/5  XI: neck flexion strength  5/5  XII: tongue strength  Normal    Data Review Lab Results  Component Value Date   WBC 5.8 07/19/2018   HGB 15.3 07/19/2018   HCT 45.9 07/19/2018   MCV 100.2 (H) 07/19/2018   PLT 187 07/19/2018   Lab Results  Component Value Date   NA 136 07/19/2018   K 4.1 07/19/2018   CL 103 07/19/2018   CO2 26 07/19/2018   BUN 12 07/19/2018   CREATININE 1.00 07/19/2018   GLUCOSE 100 (H) 07/19/2018   Lab Results  Component Value Date   INR 1.07 06/25/2018    Assessment/Plan: C5-6 spondylosis, stenosis, cervical radiculopathy, cervical myelopathy: I discussed the situation with the patient.  I reviewed his MRI scan with him and pointed out the abnormalities.  We have discussed the various treatment options including surgery.  I have described the surgical treatment option of a C5-6 anterior cervical discectomy, fusion and plating.  I have shown him surgical models.  I have given him a surgical pamphlet.   We have discussed the risks, benefits, alternatives, expected postoperative course, and likelihood of achieving our goals with surgery.  I have answered all his questions.  He has decided to proceed with surgery.   Ophelia Charter 07/24/2018 2:55 PM

## 2018-07-24 NOTE — Progress Notes (Signed)
Subjective: The patient is alert and pleasant.  He is in no apparent distress.  He looks well.  Objective: Vital signs in last 24 hours: Temp:  [97.5 F (36.4 C)] 97.5 F (36.4 C) (11/13 1223) Pulse Rate:  [68] 68 (11/13 1223) Resp:  [20] 20 (11/13 1223) BP: (161)/(62) 161/62 (11/13 1223) SpO2:  [100 %] 100 % (11/13 1223) Weight:  [88.4 kg] 88.4 kg (11/13 1244) Estimated body mass index is 25.01 kg/m as calculated from the following:   Height as of this encounter: 6\' 2"  (1.88 m).   Weight as of this encounter: 88.4 kg.   Intake/Output from previous day: No intake/output data recorded. Intake/Output this shift: Total I/O In: 1000 [I.V.:1000] Out: -   Physical exam the patient is alert and pleasant.  He is moving all 4 extremities.  His dressing is clean and dry.  There is no hematoma or shift.  Lab Results: No results for input(s): WBC, HGB, HCT, PLT in the last 72 hours. BMET No results for input(s): NA, K, CL, CO2, GLUCOSE, BUN, CREATININE, CALCIUM in the last 72 hours.  Studies/Results: No results found.  Assessment/Plan: The patient is doing well.  I spoke with his wife.  LOS: 0 days     Ophelia Charter 07/24/2018, 5:34 PM

## 2018-07-24 NOTE — Anesthesia Procedure Notes (Signed)
Procedure Name: Intubation Date/Time: 07/24/2018 4:00 PM Performed by: Teressa Lower., CRNA Pre-anesthesia Checklist: Patient identified, Emergency Drugs available, Suction available and Patient being monitored Patient Re-evaluated:Patient Re-evaluated prior to induction Oxygen Delivery Method: Circle system utilized Preoxygenation: Pre-oxygenation with 100% oxygen Induction Type: IV induction Ventilation: Mask ventilation without difficulty Laryngoscope Size: Mac and 4 Grade View: Grade I Tube type: Oral Tube size: 7.5 mm Number of attempts: 1 Airway Equipment and Method: Stylet and Oral airway Placement Confirmation: ETT inserted through vocal cords under direct vision,  positive ETCO2 and breath sounds checked- equal and bilateral Secured at: 23 cm Tube secured with: Tape Dental Injury: Teeth and Oropharynx as per pre-operative assessment

## 2018-07-24 NOTE — Transfer of Care (Signed)
Immediate Anesthesia Transfer of Care Note  Patient: Samuel Ferrell  Procedure(s) Performed: ANTERIOR CERVICAL DECOMPRESSION/DISCECTOMY FUSION, INTERBODY PROSTHESIS, PLATE SCREWS CERVICAL FIVE- CERVICAL SIX (N/A Spine Cervical)  Patient Location: PACU  Anesthesia Type:General  Level of Consciousness: awake, alert  and oriented  Airway & Oxygen Therapy: Patient Spontanous Breathing and Patient connected to nasal cannula oxygen  Post-op Assessment: Report given to RN and Post -op Vital signs reviewed and stable  Post vital signs: Reviewed and stable  Last Vitals:  Vitals Value Taken Time  BP 178/113 07/24/2018  5:36 PM  Temp    Pulse 96 07/24/2018  5:37 PM  Resp 17 07/24/2018  5:37 PM  SpO2 97 % 07/24/2018  5:37 PM  Vitals shown include unvalidated device data.  Last Pain:  Vitals:   07/24/18 1244  TempSrc:   PainSc: 4       Patients Stated Pain Goal: 2 (73/66/81 5947)  Complications: No apparent anesthesia complications

## 2018-07-25 DIAGNOSIS — M50122 Cervical disc disorder at C5-C6 level with radiculopathy: Secondary | ICD-10-CM | POA: Diagnosis not present

## 2018-07-25 MED ORDER — CYCLOBENZAPRINE HCL 10 MG PO TABS: 10.0000 mg | ORAL_TABLET | Freq: Three times a day (TID) | ORAL | 0 refills | Status: DC | PRN

## 2018-07-25 MED ORDER — OXYCODONE HCL 5 MG PO TABS: 5.0000 mg | ORAL_TABLET | ORAL | 0 refills | Status: DC | PRN

## 2018-07-25 MED ORDER — DOCUSATE SODIUM 100 MG PO CAPS: 100.0000 mg | ORAL_CAPSULE | Freq: Two times a day (BID) | ORAL | 0 refills | Status: DC

## 2018-07-25 NOTE — Progress Notes (Signed)
Patient is discharged from room 3C08 at this time. Alert and in stable condition. IV site d/c'[d and instructions read to patient and spouse with understanding verbalized. Left unit via wheelchair with all belongings at side. 

## 2018-07-25 NOTE — Anesthesia Postprocedure Evaluation (Signed)
Anesthesia Post Note  Patient: PAL SHELL  Procedure(s) Performed: ANTERIOR CERVICAL DECOMPRESSION/DISCECTOMY FUSION, INTERBODY PROSTHESIS, PLATE SCREWS CERVICAL FIVE- CERVICAL SIX (N/A Spine Cervical)     Patient location during evaluation: PACU Anesthesia Type: General Level of consciousness: awake and alert Pain management: pain level controlled Vital Signs Assessment: post-procedure vital signs reviewed and stable Respiratory status: spontaneous breathing, nonlabored ventilation, respiratory function stable and patient connected to nasal cannula oxygen Cardiovascular status: blood pressure returned to baseline and stable Postop Assessment: no apparent nausea or vomiting Anesthetic complications: no    Last Vitals:  Vitals:   07/24/18 2258 07/25/18 0322  BP: (!) 145/76 (!) 151/69  Pulse: 72 74  Resp: 20 20  Temp: 36.5 C (!) 36.3 C  SpO2: 95% 97%    Last Pain:  Vitals:   07/25/18 0404  TempSrc:   PainSc: 0-No pain                 [place] Yepiz DAVID

## 2018-07-25 NOTE — Discharge Instructions (Signed)
Wound Care Leave incision open to air. You may shower. Do not scrub directly on incision.  Do not put any creams, lotions, or ointments on incision. Activity Walk each and every day, increasing distance each day. No lifting greater than 5 lbs.  Avoid excessive neck motion. No driving for 2 weeks; may ride as a passenger locally. Wear neck brace at all times except when showering.   Diet Resume your normal diet.  Return to Work Will be discussed at you follow up appointment. Call Your Doctor If Any of These Occur Redness, drainage, or swelling at the wound.  Temperature greater than 101 degrees. Severe pain not relieved by pain medication. Increased difficulty swallowing. Incision starts to come apart. Follow Up Appt Call today for appointment in 1-2 weeks ([phone]) or for problems.  If you have any hardware placed in your spine, you will need an x-ray before your appointment.

## 2018-07-25 NOTE — Evaluation (Signed)
Occupational Therapy Evaluation and Discharge Patient Details Name: Samuel Ferrell MRN: [id number] DOB: [birth year] Today's Date: 07/25/2018    History of Present Illness C5-6 anterior cervical discectomy/decompression and fusion   Clinical Impression   This 72 yo male admitted and underwent above presents to acute OT with all education completed, we will D/C from acute OT.    Follow Up Recommendations  No OT follow up    Equipment Recommendations  None recommended by OT       Precautions / Restrictions Precautions Precautions: Cervical Precaution Booklet Issued: Yes (comment) Required Braces or Orthoses: Cervical Brace Cervical Brace: Hard collar(can have off for showering, UBD, oral care, and eating) Restrictions Weight Bearing Restrictions: No      Mobility Bed Mobility Overal bed mobility: Modified Independent             General bed mobility comments: HOB Up, pt intends on sleeping in recliner  Transfers Overall transfer level: Independent Equipment used: None Transfers: Sit to/from Stand Sit to Stand: Independent         General transfer comment: Pt able to go up and down a flight of steps Mod I with one rail    Balance Overall balance assessment: No apparent balance deficits (not formally assessed)                                         ADL either performed or assessed with clinical judgement   ADL                                         General ADL Comments: Educated pt on use of 2 cups for brushing teeth, when he can have collar off, sleeping in recliner intially will probably be better, bringing legs up to him to dress socks-shoes-pants, wear and care of collar.     Vision Patient Visual Report: No change from baseline              Pertinent Vitals/Pain Pain Assessment: No/denies pain     Hand Dominance Right   Extremity/Trunk Assessment Upper Extremity Assessment Upper Extremity Assessment:  Overall WFL for tasks assessed(pt reports no more pain or tingling in RUE)           Communication Communication Communication: No difficulties   Cognition Arousal/Alertness: Awake/alert Behavior During Therapy: WFL for tasks assessed/performed Overall Cognitive Status: Within Functional Limits for tasks assessed                                                Home Living Family/patient expects to be discharged to:: Private residence Living Arrangements: Spouse/significant other;Non-relatives/Friends Available Help at Discharge: Family;Available 24 hours/day Type of Home: House Home Access: Stairs to enter [employer] of Steps: 4-5 Entrance Stairs-Rails: Right(going up) Home Layout: One level     Bathroom Shower/Tub: Walk-in shower;Door   [employer] Toilet: Standard     Home Equipment: None          Prior Functioning/Environment Level of Independence: Independent                         OT Goals(Current goals can be found  in the care plan section) Acute Rehab OT Goals Patient Stated Goal: home today  OT Frequency:                AM-PAC PT "6 Clicks" Daily Activity     Outcome Measure Help from another person eating meals?: None Help from another person taking care of personal grooming?: None Help from another person toileting, which includes using toliet, bedpan, or urinal?: None Help from another person bathing (including washing, rinsing, drying)?: None Help from another person to put on and taking off regular upper body clothing?: None Help from another person to put on and taking off regular lower body clothing?: None 6 Click Score: 24   End of Session Equipment Utilized During Treatment: Cervical collar Nurse Communication: (no further OT Needs)  Activity Tolerance: Patient tolerated treatment well Patient left: in bed;with call bell/phone within reach                   Time: 2258-3462 OT Time Calculation  (min): 35 min Charges:  OT General Charges $OT Visit: 1 Visit OT Evaluation $OT Eval Moderate Complexity: 1 Mod OT Treatments $Self Care/Home Management : 8-22 mins  [name], OTR/L Acute Rehab Services Pager [phone] Office [phone]     [name] 07/25/2018, 9:20 AM

## 2018-07-25 NOTE — Discharge Summary (Signed)
Physician Discharge Summary  Patient ID: Samuel Ferrell MRN: [id number] DOB/AGE: 03/18/1946 72 y.o.  Admit date: 07/24/2018 Discharge date: 07/25/2018  Admission Diagnoses: C5-6 herniated disc, cervical stenosis, cervical myelopathy, cervicalgia  Discharge Diagnoses: The same   Active Problems:   HNP (herniated nucleus pulposus) with myelopathy, cervical   Discharged Condition: good  Hospital Course: I performed a C5-6 anterior cervical discectomy, fusion and plating on the patient on 07/24/2018.  The surgery went well.  The patient's postoperative course was unremarkable.  On postoperative day 1 he requested discharge home.  He was given written and oral discharge instructions.  All his questions were answered.  Consults: Occupational Therapy Significant Diagnostic Studies: None Treatments: C5-6 anterior cervical discectomy, fusion and plating. Discharge Exam: Blood pressure (!) 146/76, pulse 81, temperature 97.9 F (36.6 C), temperature source Oral, resp. rate 18, height 6\' 2"  (1.88 m), weight 88.4 kg, SpO2 95 %. The patient is alert and pleasant.  His dressing is clean and dry.  There is no hematoma or shift.  His strength is normal.  Disposition: Home  Discharge Instructions    Call MD for:  difficulty breathing, headache or visual disturbances   Complete by:  As directed    Call MD for:  extreme fatigue   Complete by:  As directed    Call MD for:  hives   Complete by:  As directed    Call MD for:  persistant dizziness or light-headedness   Complete by:  As directed    Call MD for:  persistant nausea and vomiting   Complete by:  As directed    Call MD for:  redness, tenderness, or signs of infection (pain, swelling, redness, odor or green/yellow discharge around incision site)   Complete by:  As directed    Call MD for:  severe uncontrolled pain   Complete by:  As directed    Call MD for:  temperature >100.4   Complete by:  As directed    Diet - low sodium heart  healthy   Complete by:  As directed    Discharge instructions   Complete by:  As directed    Call [phone] for a followup appointment. Take a stool softener while you are using pain medications.   Driving Restrictions   Complete by:  As directed    Do not drive for 2 weeks.   Increase activity slowly   Complete by:  As directed    Lifting restrictions   Complete by:  As directed    Do not lift more than 5 pounds. No excessive bending or twisting.   May shower / Bathe   Complete by:  As directed    Remove the dressing for 3 days after surgery.  You may shower, but leave the incision alone.   Remove dressing in 48 hours   Complete by:  As directed    Your stitches are under the scan and will dissolve by themselves. The Steri-Strips will fall off after you take a few showers. Do not rub back or pick at the wound, Leave the wound alone.     Allergies as of 07/25/2018   No Known Allergies     Medication List    STOP taking these medications   acetaminophen 500 MG tablet Commonly known as:  TYLENOL   aspirin 81 MG EC tablet     TAKE these medications   atorvastatin 80 MG tablet Commonly known as:  LIPITOR Take 80 mg by mouth every evening.  cyclobenzaprine 10 MG tablet Commonly known as:  FLEXERIL Take 1 tablet (10 mg total) by mouth 3 (three) times daily as needed for muscle spasms.   docusate sodium 100 MG capsule Commonly known as:  COLACE Take 1 capsule (100 mg total) by mouth 2 (two) times daily.   enalapril 20 MG tablet Commonly known as:  VASOTEC Take 20 mg by mouth at bedtime.   ferrous sulfate 325 (65 FE) MG tablet Take 325 mg by mouth daily with breakfast.   flecainide 50 MG tablet Commonly known as:  TAMBOCOR Take 1 tablet (50 mg total) by mouth 2 (two) times daily.   metoprolol tartrate 50 MG tablet Commonly known as:  LOPRESSOR Take 50 mg by mouth 2 (two) times daily.   multivitamin with minerals Tabs tablet Take 1 tablet by mouth daily.    omeprazole 20 MG capsule Commonly known as:  PRILOSEC Take 20 mg by mouth daily at 12 noon.   oxyCODONE 5 MG immediate release tablet Commonly known as:  Oxy IR/ROXICODONE Take 1 tablet (5 mg total) by mouth every 4 (four) hours as needed for moderate pain ((score 4 to 6)).   SUPER B COMPLEX/C Caps Take 1 capsule by mouth daily.   verapamil 120 MG tablet Commonly known as:  CALAN Take 120 mg by mouth as needed (afib).   XARELTO 20 MG Tabs tablet Generic drug:  rivaroxaban Take 20 mg by mouth daily at 6 PM.        Signed: Ophelia Charter 07/25/2018, 11:09 AM

## 2018-07-29 ENCOUNTER — Encounter (HOSPITAL_COMMUNITY): Payer: Self-pay | Admitting: Neurosurgery

## 2018-11-12 ENCOUNTER — Encounter: Payer: Self-pay | Admitting: Internal Medicine

## 2018-11-13 ENCOUNTER — Ambulatory Visit (INDEPENDENT_AMBULATORY_CARE_PROVIDER_SITE_OTHER): Payer: Medicare Other | Admitting: Internal Medicine

## 2018-11-13 ENCOUNTER — Encounter: Payer: Self-pay | Admitting: Internal Medicine

## 2018-11-13 VITALS — BP 112/74 | HR 68 | Ht 73.0 in | Wt 199.4 lb

## 2018-11-13 DIAGNOSIS — Z720 Tobacco use: Secondary | ICD-10-CM

## 2018-11-13 DIAGNOSIS — I484 Atypical atrial flutter: Secondary | ICD-10-CM

## 2018-11-13 DIAGNOSIS — I4819 Other persistent atrial fibrillation: Secondary | ICD-10-CM | POA: Diagnosis not present

## 2018-11-13 DIAGNOSIS — F1010 Alcohol abuse, uncomplicated: Secondary | ICD-10-CM

## 2018-11-13 NOTE — Progress Notes (Signed)
PCP: Haywood Pao, MD Primary Cardiologist: Dr Wynonia Lawman Primary EP: Dr Rayann Heman  Samuel Ferrell is a 73 y.o. male who presents to establish electrophysiology followup.  I saw him in 2015 (my note reviewed).  He has since been followed by Dr Wynonia Lawman.  He is s/p afib ablation x 2 previously with Dr [name].  He also has a h/o atypical atrial flutter.  He drinks ETOH which is likely an trigger for his arrhythmias.  Since last being seen in our clinic, the patient reports doing very well.  Today, he denies symptoms of palpitations, chest pain, shortness of breath,  lower extremity edema, dizziness, presyncope, or syncope.  The patient is otherwise without complaint today.   Past Medical History:  Diagnosis Date  . Afib [facility])    s/p ablation x2 by Dr [name] at [facility] in 2007, 2009  . Alcohol abuse   . 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 COPD (chronic obstructive pulmonary disease) ([place])    pt denies  . CVA (cerebral infarction)   . Hyperlipidemia   . Hypertension   . Palpitations   . Skin cancer--melanoma   . Stroke [facility])    TIA   Past Surgical History:  Procedure Laterality Date  . ANTERIOR CERVICAL DECOMP/DISCECTOMY FUSION N/A 07/24/2018   Procedure: ANTERIOR CERVICAL DECOMPRESSION/DISCECTOMY FUSION, INTERBODY PROSTHESIS, PLATE SCREWS CERVICAL FIVE- CERVICAL SIX;  Surgeon: Newman Pies, MD;  Location: [place];  Service: Neurosurgery;  Laterality: N/A;  ANTERIOR CERVICAL DECOMPRESSION/DISCECTOMY FUSION, INTERBODY PROSTHESIS, PLATE SCREWS CERVICAL FIVE- CERVICAL SIX  . APPENDECTOMY    . ATRIAL FIBRILLATION ABLATION  2007 ,2009   Dr [name] at [place]     right clavicle    ROS- all systems are reviewed and negatives except as per HPI above  Current Outpatient Medications  Medication Sig Dispense Refill  . atorvastatin (LIPITOR) 80 MG tablet Take 80 mg by mouth every evening.     . enalapril (VASOTEC) 20 MG tablet Take 20 mg by mouth at bedtime.     .  ferrous sulfate 325 (65 FE) MG tablet Take 325 mg by mouth daily with breakfast.    . flecainide (TAMBOCOR) 50 MG tablet Take 1 tablet (50 mg total) by mouth 2 (two) times daily. 180 tablet 0  . metoprolol (LOPRESSOR) 50 MG tablet Take 50 mg by mouth 2 (two) times daily.     Marland Kitchen omeprazole (PRILOSEC) 20 MG capsule Take 20 mg by mouth daily at 12 noon.     . SUPER B COMPLEX/C CAPS Take 1 capsule by mouth daily.    . verapamil (CALAN) 120 MG tablet Take 120 mg by mouth as needed (afib).     [name] 20 MG TABS tablet Take 20 mg by mouth daily at 6 PM.      No current facility-administered medications for this visit.     Physical Exam: Vitals:   11/13/18 0859  BP: 112/74  Pulse: 68  SpO2: 96%  Weight: 199 lb 6.4 oz (90.4 kg)  Height: 6\' 1"  (1.854 m)    GEN- The patient is well appearing, alert and oriented x 3 today.   Head- normocephalic, atraumatic Eyes-  Sclera clear, conjunctiva pink Ears- hearing intact Oropharynx- clear Lungs- Clear to ausculation bilaterally, normal work of breathing Heart- Regular rate and rhythm, no murmurs, rubs or gallops, PMI not laterally displaced GI- soft, NT, ND, + BS Extremities- no clubbing, cyanosis, or edema  Wt Readings from Last 3 Encounters:  11/13/18  199 lb 6.4 oz (90.4 kg)  07/24/18 194 lb 12.8 oz (88.4 kg)  07/19/18 194 lb 12.8 oz (88.4 kg)    EKG tracing ordered today is personally reviewed and shows sinus rhythm 68 bpm, PR 272 msec, LVH  Echo 06/25/18 reveals EF 55%  Assessment and Plan:  1. afib (persistent) and atypical atrial flutter S/p ablation x 2 by Dr [name] Likely worsened by ETOH We discussed lifestyle modification at length today He is doing well with current therapy First degree AV block is noted.  I would NOT advise increase to flecainide in the future.  We should consider reducing metoprolol on return.  2. ETOH Cessation advised He is not ready to quit  3. Tobacco Cessation advised  Follow-up in the AF  clinic every 6 months  Thompson Grayer MD, [facility] 11/13/2018 9:51 AM

## 2018-11-13 NOTE — Patient Instructions (Addendum)
Medication Instructions:  Your physician recommends that you continue on your current medications as directed. Please refer to the Current Medication list given to you today.  Labwork: None ordered.  Testing/Procedures: None ordered.  Follow-Up: Your physician wants you to follow-up in: 6 months with the AFIB clinic.   You will receive a reminder letter in the mail two months in advance. If you don't receive a letter, please call our office to schedule the follow-up appointment.   Any Other Special Instructions Will Be Listed Below (If Applicable).  If you need a refill on your cardiac medications before your next appointment, please call your pharmacy.

## 2019-01-28 ENCOUNTER — Telehealth: Payer: Self-pay | Admitting: *Deleted

## 2019-01-28 MED ORDER — FLECAINIDE ACETATE 50 MG PO TABS: 50.0000 mg | ORAL_TABLET | Freq: Two times a day (BID) | ORAL | 1 refills | Status: DC

## 2019-01-28 NOTE — Telephone Encounter (Signed)
Rx refill sent to pharmacy.  *STAT* If patient is at the pharmacy, call can be transferred to refill team.   1. Which medications need to be refilled? (please list name of each medication and dose if known) Flecainide 50mg  bid  2. Which pharmacy/location (including street and city if local pharmacy) is medication to be sent to?[facility]  3. Do they need a 30 day or 90 day supply? [place]

## 2019-05-22 ENCOUNTER — Ambulatory Visit (HOSPITAL_COMMUNITY): Payer: Medicare Other | Admitting: Nurse Practitioner

## 2019-06-03 ENCOUNTER — Other Ambulatory Visit: Payer: Self-pay

## 2019-06-03 ENCOUNTER — Ambulatory Visit (HOSPITAL_COMMUNITY)
Admission: RE | Admit: 2019-06-03 | Discharge: 2019-06-03 | Disposition: A | Discharge status: Home / Self Care | Payer: Medicare Other | Source: Ambulatory Visit | Attending: Nurse Practitioner | Admitting: Nurse Practitioner

## 2019-06-03 ENCOUNTER — Encounter (HOSPITAL_COMMUNITY): Payer: Self-pay | Admitting: Nurse Practitioner

## 2019-06-03 VITALS — BP 118/70 | HR 63 | Ht 73.0 in | Wt 195.6 lb

## 2019-06-03 DIAGNOSIS — I440 Atrioventricular block, first degree: Secondary | ICD-10-CM | POA: Diagnosis not present

## 2019-06-03 DIAGNOSIS — F17210 Nicotine dependence, cigarettes, uncomplicated: Secondary | ICD-10-CM | POA: Diagnosis not present

## 2019-06-03 DIAGNOSIS — I10 Essential (primary) hypertension: Secondary | ICD-10-CM | POA: Diagnosis not present

## 2019-06-03 DIAGNOSIS — Z7901 Long term (current) use of anticoagulants: Secondary | ICD-10-CM | POA: Insufficient documentation

## 2019-06-03 DIAGNOSIS — Z8673 Personal history of transient ischemic attack (TIA), and cerebral infarction without residual deficits: Secondary | ICD-10-CM | POA: Diagnosis not present

## 2019-06-03 DIAGNOSIS — I484 Atypical atrial flutter: Secondary | ICD-10-CM | POA: Insufficient documentation

## 2019-06-03 DIAGNOSIS — F1010 Alcohol abuse, uncomplicated: Secondary | ICD-10-CM | POA: Insufficient documentation

## 2019-06-03 DIAGNOSIS — E785 Hyperlipidemia, unspecified: Secondary | ICD-10-CM | POA: Diagnosis not present

## 2019-06-03 DIAGNOSIS — I480 Paroxysmal atrial fibrillation: Secondary | ICD-10-CM | POA: Diagnosis not present

## 2019-06-03 DIAGNOSIS — Z79899 Other long term (current) drug therapy: Secondary | ICD-10-CM | POA: Insufficient documentation

## 2019-06-03 NOTE — Patient Instructions (Signed)
Decrease metoprolol to 25mg  twice a day (1/2 of your 50mg  tab)  If you notice increase in fluttering/palptations - go back up to 50mg  twice a day

## 2019-06-03 NOTE — Progress Notes (Signed)
Primary Care Physician: Tisovec, Fransico Him, MD Referring Physician: Dr. Desiree Hane is a 73 y.o. male with a h/o afib/atypical atrial flutter, ablation x 2 by Dr. [name], that is in the afib clinic for f/u. He saw Dr. Rayann Heman 6 motnhs ago after being followed by Dr. Wynonia Lawman x 10 years. He continues to drink a 6 pack every couple days and smokes a pack a day. Despite this, he denies any afib. 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 Rayann Heman recommended not to increase flecainide in the future and to consider  reduction in BB  on return.    Today, he denies symptoms of palpitations, chest pain, shortness of breath, orthopnea, PND, lower extremity edema, dizziness, presyncope, syncope, or neurologic sequela. The patient is tolerating medications without difficulties and is otherwise without complaint today.   Past Medical History:  Diagnosis Date  . Afib [facility])    s/p ablation x2 by Dr [name] at [facility] in 2007, 2009  . Alcohol abuse   . Atypical atrial flutter ([place])   . COPD (chronic obstructive pulmonary disease) ([place])    pt denies  . CVA (cerebral infarction)   . Hyperlipidemia   . Hypertension   . Palpitations   . Skin cancer--melanoma   . Stroke [facility])    TIA   Past Surgical History:  Procedure Laterality Date  . ANTERIOR CERVICAL DECOMP/DISCECTOMY FUSION N/A 07/24/2018   Procedure: ANTERIOR CERVICAL DECOMPRESSION/DISCECTOMY FUSION, INTERBODY PROSTHESIS, PLATE SCREWS CERVICAL FIVE- CERVICAL SIX;  Surgeon: Newman Pies, MD;  Location: [place];  Service: Neurosurgery;  Laterality: N/A;  ANTERIOR CERVICAL DECOMPRESSION/DISCECTOMY FUSION, INTERBODY PROSTHESIS, PLATE SCREWS CERVICAL FIVE- CERVICAL SIX  . APPENDECTOMY    . ATRIAL FIBRILLATION ABLATION  2007 ,2009   Dr [name] at [place]     right clavicle    Current Outpatient Medications  Medication Sig Dispense Refill  . atorvastatin (LIPITOR) 80 MG tablet Take 80 mg by mouth every  evening.     . enalapril (VASOTEC) 20 MG tablet Take 20 mg by mouth at bedtime.     . ferrous sulfate 325 (65 FE) MG tablet Take 325 mg by mouth daily with breakfast.    . flecainide (TAMBOCOR) 50 MG tablet Take 1 tablet (50 mg total) by mouth 2 (two) times daily. 180 tablet 1  . metoprolol (LOPRESSOR) 50 MG tablet Take 25 mg by mouth 2 (two) times daily.    Marland Kitchen omeprazole (PRILOSEC) 20 MG capsule Take 20 mg by mouth daily at 12 noon.     . SUPER B COMPLEX/C CAPS Take 1 capsule by mouth daily.    . verapamil (CALAN) 120 MG tablet Take 120 mg by mouth as needed (afib).     [name] 20 MG TABS tablet Take 20 mg by mouth daily at 6 PM.     . finasteride (PROSCAR) 5 MG tablet Take 5 mg by mouth daily.     No current facility-administered medications for this encounter.     No Known Allergies  Social History   Socioeconomic History  . Marital status: Significant Other    Spouse name: Not on file  . Number of children: 2  . Years of education: Not on file  . Highest education level: Not on file  Occupational History  . Occupation: Retired  Scientific laboratory technician  . Financial resource strain: Not on file  . Food insecurity    Worry: Not on file    Inability:  Not on file  . Transportation needs    Medical: Not on file    Non-medical: Not on file  Tobacco Use  . Smoking status: Current Every Day Smoker    Packs/day: 1.00    Years: 35.00    Pack years: 35.00    Types: Cigarettes  . Smokeless tobacco: Never Used  . Tobacco comment: pack a day  Substance and Sexual Activity  . Alcohol use: Yes    Comment: heavy, up to 6 beers per day  . Drug use: No  . Sexual activity: Not on file  Lifestyle  . Physical activity    Days per week: Not on file    Minutes per session: Not on file  . Stress: Not on file  Relationships  . Social Herbalist on phone: Not on file    Gets together: Not on file    Attends religious service: Not on file    Active member of club or organization: Not  on file    Attends meetings of clubs or organizations: Not on file    Relationship status: Not on file  . Intimate partner violence    Fear of current or ex partner: Not on file    Emotionally abused: Not on file    Physically abused: Not on file    Forced sexual activity: Not on file  Other Topics Concern  . Not on file  Social History Narrative   Lives in [place].  Works as a Scientist, forensic    Family History  Problem Relation Age of Onset  . Cancer Other   . Colon cancer Neg Hx   . Esophageal cancer Neg Hx   . Rectal cancer Neg Hx   . Stomach cancer Neg Hx     ROS- All systems are reviewed and negative except as per the HPI above  Physical Exam: Vitals:   06/03/19 0846  BP: 118/70  Pulse: 63  Weight: 88.7 kg  Height: 6\' 1"  (1.854 m)   Wt Readings from Last 3 Encounters:  06/03/19 88.7 kg  11/13/18 90.4 kg  07/24/18 88.4 kg    Labs: Lab Results  Component Value Date   NA 136 07/19/2018   K 4.1 07/19/2018   CL 103 07/19/2018   CO2 26 07/19/2018   GLUCOSE 100 (H) 07/19/2018   BUN 12 07/19/2018   CREATININE 1.00 07/19/2018   CALCIUM 9.1 07/19/2018   Lab Results  Component Value Date   INR 1.07 06/25/2018   Lab Results  Component Value Date   CHOL 129 06/26/2018   HDL 34 (L) 06/26/2018   LDLCALC 77 06/26/2018   TRIG 89 06/26/2018     GEN- The patient is well appearing, alert and oriented x 3 today.   Head- normocephalic, atraumatic Eyes-  Sclera clear, conjunctiva pink Ears- hearing intact Oropharynx- clear Neck- supple, no JVP Lymph- no cervical lymphadenopathy Lungs- Clear to ausculation bilaterally, normal work of breathing Heart- Regular rate and rhythm, no murmurs, rubs or gallops, PMI not laterally displaced GI- soft, NT, ND, + BS Extremities- no clubbing, cyanosis, or edema MS- no significant deformity or atrophy Skin- no rash or lesion Psych- euthymic mood, full affect Neuro- strength and sensation are intact  EKG-SR  with first degree AV block, 63 bpm, pr int 284 ms, qrs int 88 ms, qtc 418 ms Epic records reviewed    Assessment and Plan: 1. Paroxysmal afib/atypical atrial flutter Pt is doing well and is not having any afib  Continue flecainide 50 mg bid and is not a candidate to increase flecainide if afib worsens for first degree AVB  2. 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 Tobacco/alcohol abuse Not motivated  to change lifestyle  4. CHA2DS2VASc score of 3 Continue xarelto 20 mg daily   F/u Dr. Rayann Heman in 6 months per pt wishes  Geroge Baseman. Adilen Pavelko, [facility] [address] [place], [place] [address] [phone]

## 2019-06-09 ENCOUNTER — Telehealth (HOSPITAL_COMMUNITY): Payer: Self-pay | Admitting: *Deleted

## 2019-06-09 NOTE — Telephone Encounter (Signed)
Patient states over weekend he had an episode of afib that required verapamil to convert. He has since went back up to metoprolol 50mg  BID. Pt will call if further issues.

## 2019-06-26 ENCOUNTER — Other Ambulatory Visit (HOSPITAL_COMMUNITY): Payer: Self-pay | Admitting: *Deleted

## 2019-06-26 MED ORDER — METOPROLOL TARTRATE 50 MG PO TABS: 50.0000 mg | ORAL_TABLET | Freq: Two times a day (BID) | ORAL | 2 refills | Status: DC

## 2019-07-25 ENCOUNTER — Other Ambulatory Visit: Payer: Self-pay | Admitting: Internal Medicine

## 2019-07-25 ENCOUNTER — Encounter: Payer: Self-pay | Admitting: Internal Medicine

## 2019-09-23 ENCOUNTER — Encounter: Payer: Self-pay | Admitting: Internal Medicine

## 2019-10-18 ENCOUNTER — Other Ambulatory Visit: Payer: Self-pay | Admitting: Internal Medicine

## 2019-10-27 ENCOUNTER — Encounter: Payer: Self-pay | Admitting: Internal Medicine

## 2019-10-27 ENCOUNTER — Ambulatory Visit (INDEPENDENT_AMBULATORY_CARE_PROVIDER_SITE_OTHER): Payer: Medicare Other | Admitting: Internal Medicine

## 2019-10-27 ENCOUNTER — Telehealth: Payer: Self-pay

## 2019-10-27 ENCOUNTER — Other Ambulatory Visit: Payer: Self-pay

## 2019-10-27 VITALS — BP 100/50 | HR 80 | Temp 98.3°F | Ht 73.0 in | Wt 196.2 lb

## 2019-10-27 DIAGNOSIS — Z86010 Personal history of colonic polyps: Secondary | ICD-10-CM | POA: Diagnosis not present

## 2019-10-27 DIAGNOSIS — Z7901 Long term (current) use of anticoagulants: Secondary | ICD-10-CM

## 2019-10-27 DIAGNOSIS — Z01818 Encounter for other preprocedural examination: Secondary | ICD-10-CM

## 2019-10-27 DIAGNOSIS — I480 Paroxysmal atrial fibrillation: Secondary | ICD-10-CM | POA: Diagnosis not present

## 2019-10-27 MED ORDER — NA SULFATE-K SULFATE-MG SULF 17.5-3.13-1.6 GM/177ML PO SOLN: 1.0000 | Freq: Once | ORAL | 0 refills | Status: AC

## 2019-10-27 NOTE — Patient Instructions (Signed)
  If you are age 72 or older, your body mass index should be between 23-30. Your Body mass index is 25.89 kg/m. If this is out of the aforementioned range listed, please consider follow up with your Primary Care Provider.  If you are age 34 or younger, your body mass index should be between 19-25. Your Body mass index is 25.89 kg/m. If this is out of the aformentioned range listed, please consider follow up with your Primary Care Provider.     You have been scheduled for a colonoscopy. Please follow written instructions given to you at your visit today.  Please pick up your prep supplies at the pharmacy within the next 1-3 days. If you use inhalers (even only as needed), please bring them with you on the day of your procedure. Your physician has requested that you go to www.startemmi.com and enter the access code given to you at your visit today. This web site gives a general overview about your procedure. However, you should still follow specific instructions given to you by our office regarding your preparation for the procedure.

## 2019-10-27 NOTE — Telephone Encounter (Signed)
Pt takes Xarelto for afib with CHADS2VASc score of 4 (age, HTN, CVA). Renal function is normal.   Recommend only holding Xarelto for 1 day prior to low risk procedure due to elevated risk off of anticoagulation (afib with history of CVA).

## 2019-10-27 NOTE — Progress Notes (Signed)
HISTORY OF PRESENT ILLNESS:  Samuel Ferrell is a 74 y.o. male with a history of atrial fibrillation status post ablation on chronic Xarelto therapy.  He presents today regarding surveillance colonoscopy.  Multiple prior colonoscopies.  Index exam March 2009 with large tubular adenoma removed piecemeal.  Follow-up examination 6 months later revealed clean polypectomy site but multiple small adenomas.  His last colonoscopy was October 2015.  At that time found to have a small tubular adenoma and moderate left-sided diverticulosis.  At that time he was on Xarelto therapy which was held for his examination with cardiology approval.  He presents now regarding routine surveillance.  The patient denies any significant interval GI problems.  He denies rectal bleeding or change in bowel habits.  He has been stable from a cardiac standpoint.  I have reviewed his last cardiology outpatient evaluation.  Review of outside blood work from his primary care provider dated July 14, 2019 finds normal hemoglobin of 13.8.  REVIEW OF SYSTEMS:  All non-GI ROS negative unless otherwise stated in the HPI.  Past Medical History:  Diagnosis Date  . Afib [facility])    s/p ablation x2 by Dr [name] at [facility] in 2007, 2009  . Alcohol abuse   . Atypical atrial flutter ([place])   . COPD (chronic obstructive pulmonary disease) ([place])    pt denies  . CVA (cerebral infarction)   . Hyperlipidemia   . Hypertension   . Palpitations   . Skin cancer--melanoma   . Stroke [facility])    TIA    Past Surgical History:  Procedure Laterality Date  . ANTERIOR CERVICAL DECOMP/DISCECTOMY FUSION N/A 07/24/2018   Procedure: ANTERIOR CERVICAL DECOMPRESSION/DISCECTOMY FUSION, INTERBODY PROSTHESIS, PLATE SCREWS CERVICAL FIVE- CERVICAL SIX;  Surgeon: Newman Pies, MD;  Location: [place];  Service: Neurosurgery;  Laterality: N/A;  ANTERIOR CERVICAL DECOMPRESSION/DISCECTOMY FUSION, INTERBODY PROSTHESIS, PLATE SCREWS CERVICAL FIVE- CERVICAL SIX  .  APPENDECTOMY    . ATRIAL FIBRILLATION ABLATION  2007 ,2009   Dr [name] at [place]     right clavicle    Social History Samuel Ferrell  reports that he has been smoking cigarettes. He has a 35.00 pack-year smoking history. He has never used smokeless tobacco. He reports previous alcohol use. He reports that he does not use drugs.  family history includes Cancer in an other family member.  No Known Allergies     PHYSICAL EXAMINATION: Vital signs: BP (!) 100/50   Pulse 80   Temp 98.3 F (36.8 C)   Ht 6\' 1"  (1.854 m)   Wt 196 lb 4 oz (89 kg)   BMI 25.89 kg/m   Constitutional: generally well-appearing, no acute distress Psychiatric: alert and oriented x3, cooperative Eyes: extraocular movements intact, anicteric, conjunctiva pink Mouth: oral pharynx moist, no lesions Neck: supple no lymphadenopathy Cardiovascular: heart regular rate and rhythm, no murmur Lungs: clear to auscultation bilaterally Abdomen: soft, nontender, nondistended, no obvious ascites, no peritoneal signs, normal bowel sounds, no organomegaly Rectal: Deferred until colonoscopy Extremities: no clubbing, cyanosis, or lower extremity edema bilaterally Skin: no lesions on visible extremities Neuro: No focal deficits.  Cranial nerves intact  ASSESSMENT:  1.  History of multiple and advanced adenomatous colon polyps.  Due for surveillance 2.  Multiple medical problems including COPD 3.  History of atrial fibrillation status post ablation on Xarelto therapy   PLAN:  1.  Schedule surveillance colonoscopy.  The patient is high risk given his comorbidities and the need to address his chronic anticoagulation  therapy.The nature of the procedure, as well as the risks, benefits, and alternatives were carefully and thoroughly reviewed with the patient. Ample time for discussion and questions allowed. The patient understood, was satisfied, and agreed to proceed. 2.  Hold Xarelto 2 days prior to the  examination.  Expect to initiate therapy immediately post procedure.  This as previously.  We will correspond with his cardiologist Dr. Rayann Heman.

## 2019-10-27 NOTE — Telephone Encounter (Signed)
[facility] Pre-operative Risk Assessment     Request for surgical clearance:     Endoscopy Procedure  What type of surgery is being performed?     Colonoscopy  When is this surgery scheduled?     11/05/2019  What type of clearance is required ?   Pharmacy  Are there any medications that need to be held prior to surgery and how long? Xarelto - 2 days  Practice name and name of physician performing surgery?      [facility]  What is your office phone and fax number?      Phone- [phone]  [phone]  Anesthesia type (None, local, MAC, general) ?       MAC

## 2019-10-29 ENCOUNTER — Telehealth: Payer: Self-pay

## 2019-10-29 NOTE — Telephone Encounter (Signed)
-----   Message from Irene Shipper, MD sent at 10/29/2019  3:03 PM EST ----- Jerzey Komperda,1 day would be fine.  Please document in the medical record that you have share this information with the patient.  Hyman Bower ----- Message ----- From: Audrea Muscat, CMA Sent: 10/29/2019   2:52 PM EST To: Irene Shipper, MD  Sent request for patient to hold Xarelto for 2 days.  Cardiologist responded back to hold for 1 day because of afib with hx of CVA.  Ok to proceed with holding for one day?

## 2019-10-29 NOTE — Telephone Encounter (Signed)
   Primary Cardiologist: Jenean Lindau, MD  Chart reviewed as part of pre-operative protocol coverage. Received request to hold anticoagulation for colonoscopy, not medical clearance.   Per our clinical pharmacist: Pt takes Xarelto for afib with CHADS2VASc score of 4 (age, HTN, CVA). Renal function is normal.   Recommend only holding Xarelto for 1 day prior to low risk procedure due to elevated risk off of anticoagulation (afib with history of CVA).   I will route this recommendation to the requesting party via Epic fax function and remove from pre-op pool.  Please call with questions.  Tami Lin Daneisha Surges, PA 10/29/2019, 9:09 AM

## 2019-10-29 NOTE — Telephone Encounter (Signed)
Pt has been advised he will need to hold Xarelto x 1 day prior to procedure, and resume after procedure though the surgeon will advise him of when restarting. Pt thanked me for the call. I will remove from the pre op call back pool.

## 2019-10-29 NOTE — Telephone Encounter (Signed)
Spoke with patient and told him that per the cardiologist he was to hold his Xarelto for 1 day prior to his procedure.  Patient agreed.

## 2019-11-03 ENCOUNTER — Ambulatory Visit (INDEPENDENT_AMBULATORY_CARE_PROVIDER_SITE_OTHER): Payer: Medicare Other

## 2019-11-03 ENCOUNTER — Other Ambulatory Visit: Payer: Self-pay | Admitting: Internal Medicine

## 2019-11-03 DIAGNOSIS — Z1159 Encounter for screening for other viral diseases: Secondary | ICD-10-CM

## 2019-11-03 LAB — SARS CORONAVIRUS 2 (TAT 6-24 HRS): SARS Coronavirus 2: NEGATIVE

## 2019-11-05 ENCOUNTER — Encounter: Payer: Self-pay | Admitting: Internal Medicine

## 2019-11-05 ENCOUNTER — Other Ambulatory Visit: Payer: Self-pay

## 2019-11-05 ENCOUNTER — Ambulatory Visit (AMBULATORY_SURGERY_CENTER): Payer: Medicare Other | Admitting: Internal Medicine

## 2019-11-05 VITALS — BP 130/62 | HR 66 | Temp 97.3°F | Resp 17 | Ht 73.0 in | Wt 196.0 lb

## 2019-11-05 DIAGNOSIS — Z86010 Personal history of colonic polyps: Secondary | ICD-10-CM

## 2019-11-05 DIAGNOSIS — D122 Benign neoplasm of ascending colon: Secondary | ICD-10-CM

## 2019-11-05 DIAGNOSIS — D125 Benign neoplasm of sigmoid colon: Secondary | ICD-10-CM | POA: Diagnosis not present

## 2019-11-05 MED ORDER — SODIUM CHLORIDE 0.9 % IV SOLN: 500.0000 mL | Freq: Once | INTRAVENOUS | Status: DC

## 2019-11-05 NOTE — Patient Instructions (Signed)
YOU HAD AN ENDOSCOPIC PROCEDURE TODAY AT [facility]:   Refer to the procedure report that was given to you for any specific questions about what was found during the examination.  If the procedure report does not answer your questions, please call your gastroenterologist to clarify.  If you requested that your care partner not be given the details of your procedure findings, then the procedure report has been included in a sealed envelope for you to review at your convenience later.  YOU SHOULD EXPECT: Some feelings of bloating in the abdomen. Passage of more gas than usual.  Walking can help get rid of the air that was put into your GI tract during the procedure and reduce the bloating. If you had a lower endoscopy (such as a colonoscopy or flexible sigmoidoscopy) you may notice spotting of blood in your stool or on the toilet paper. If you underwent a bowel prep for your procedure, you may not have a normal bowel movement for a few days.  Please Note:  You might notice some irritation and congestion in your nose or some drainage.  This is from the oxygen used during your procedure.  There is no need for concern and it should clear up in a day or so.  SYMPTOMS TO REPORT IMMEDIATELY:   Following lower endoscopy (colonoscopy or flexible sigmoidoscopy):  Excessive amounts of blood in the stool  Significant tenderness or worsening of abdominal pains  Swelling of the abdomen that is new, acute  Fever of 100F or higher    For urgent or emergent issues, a gastroenterologist can be reached at any hour by calling [phone].   DIET:  We do recommend a small meal at first, but then you may proceed to your regular diet.  Drink plenty of fluids but you should avoid alcoholic beverages for 24 hours.  ACTIVITY:  You should plan to take it easy for the rest of today and you should NOT DRIVE or use heavy machinery until tomorrow (because of the sedation medicines used during the test).     FOLLOW UP: Our staff will call the number listed on your records 48-72 hours following your procedure to check on you and address any questions or concerns that you may have regarding the information given to you following your procedure. If we do not reach you, we will leave a message.  We will attempt to reach you two times.  During this call, we will ask if you have developed any symptoms of COVID 19. If you develop any symptoms (ie: fever, flu-like symptoms, shortness of breath, cough etc.) before then, please call [phone].  If you test positive for Covid 19 in the 2 weeks post procedure, please call and report this information to [place].    If any biopsies were taken you will be contacted by phone or by letter within the next 1-3 weeks.  Please call us at [phone] if you have not heard about the biopsies in 3 weeks.    SIGNATURES/CONFIDENTIALITY: You and/or your care partner have signed paperwork which will be entered into your electronic medical record.  These signatures attest to the fact that that the information above on your After Visit Summary has been reviewed and is understood.  Full responsibility of the confidentiality of this discharge information lies with you and/or your care-partner.   Resume Xarelto today and remainder of medications. Information given on polyps,diverticulosis and hemorrhoids.

## 2019-11-05 NOTE — Progress Notes (Signed)
Called to room to assist during endoscopic procedure.  Patient ID and intended procedure confirmed with present staff. Received instructions for my participation in the procedure from the performing physician.  

## 2019-11-05 NOTE — Progress Notes (Signed)
PT taken to PACU. Monitors in place. VSS. Report given to RN. 

## 2019-11-05 NOTE — Progress Notes (Signed)
Pt's states no medical or surgical changes since previsit or office visit. jb - temp Dt - vitals

## 2019-11-05 NOTE — Op Note (Signed)
[facility] Patient Name: Samuel Ferrell Procedure Date: 11/05/2019 2:26 PM MRN: [id number] Endoscopist: Docia Chuck. Henrene Pastor , MD Age: 74 Referring MD:  Date of Birth: Jan 14, 1946 Gender: Male Account #: [id number] Procedure:                Colonoscopy with cold snare polypectomy x 3 Indications:              High risk colon cancer surveillance: Personal                            history of adenoma (10 mm or greater in size), High                            risk colon cancer surveillance: Personal history of                            multiple (3 or more) adenomas. Index exam 2009 with                            large adenoma removed piecemeal. Follow-up 6 months                            later was negative for recurrent neoplasia. Last                            examination 2015 with tubular adenomas and                            diverticulosis Medicines:                Monitored Anesthesia Care Procedure:                Pre-Anesthesia Assessment:                           - Prior to the procedure, a History and Physical                            was performed, and patient medications and                            allergies were reviewed. The patient's tolerance of                            previous anesthesia was also reviewed. The risks                            and benefits of the procedure and the sedation                            options and risks were discussed with the patient.                            All questions were answered, and informed consent  was obtained. Prior Anticoagulants: The patient has                            taken Xarelto (rivaroxaban), last dose was 2 days                            prior to procedure. ASA Grade Assessment: III - A                            patient with severe systemic disease. After                            reviewing the risks and benefits, the patient was                            deemed in  satisfactory condition to undergo the                            procedure.                           After obtaining informed consent, the colonoscope                            was passed under direct vision. Throughout the                            procedure, the patient's blood pressure, pulse, and                            oxygen saturations were monitored continuously. The                            Colonoscope was introduced through the anus and                            advanced to the the cecum, identified by                            appendiceal orifice and ileocecal valve. The                            ileocecal valve, appendiceal orifice, and rectum                            were photographed. The quality of the bowel                            preparation was good. The colonoscopy was performed                            without difficulty. The patient tolerated the  procedure well. The bowel preparation used was                            SUPREP via split dose instruction. Scope In: 2:41:37 PM Scope Out: 3:01:46 PM Scope Withdrawal Time: 0 hours 17 minutes 25 seconds  Total Procedure Duration: 0 hours 20 minutes 9 seconds  Findings:                 Two small angiodysplastic lesions without bleeding                            were found in the cecum.                           Three polyps were found in the sigmoid colon and                            ascending colon. The polyps were 3 to 4 mm in size.                            These polyps were removed with a cold snare.                            Resection and retrieval were complete.                           A few medium-mouthed diverticula were found in the                            sigmoid colon and ascending colon.                           Internal hemorrhoids were found during retroflexion.                           The exam was otherwise without abnormality on                             direct and retroflexion views. Complications:            No immediate complications. Estimated blood loss:                            None. Estimated Blood Loss:     Estimated blood loss: none. Impression:               - Two non-bleeding colonic angiodysplastic lesions.                           - Three 3 to 4 mm polyps in the sigmoid colon and                            in the ascending colon, removed with a cold snare.  Resected and retrieved.                           - Diverticulosis in the sigmoid colon and in the                            ascending colon.                           - Internal hemorrhoids.                           - The examination was otherwise normal on direct                            and retroflexion views. Recommendation:           - Repeat colonoscopy in 5 years for surveillance.                           - Resume Xarelto (rivaroxaban) today at prior dose.                           - Patient has a contact number available for                            emergencies. The signs and symptoms of potential                            delayed complications were discussed with the                            patient. Return to normal activities tomorrow.                            Written discharge instructions were provided to the                            patient.                           - Resume previous diet.                           - Continue present medications.                           - Await pathology results. Docia Chuck. Henrene Pastor, MD 11/05/2019 3:10:49 PM This report has been signed electronically.

## 2019-11-07 ENCOUNTER — Telehealth: Payer: Self-pay

## 2019-11-07 NOTE — Telephone Encounter (Signed)
Follow up call to pt lm on vm

## 2019-11-09 ENCOUNTER — Ambulatory Visit: Payer: Medicare Other | Attending: Internal Medicine

## 2019-11-09 DIAGNOSIS — Z23 Encounter for immunization: Secondary | ICD-10-CM | POA: Insufficient documentation

## 2019-11-09 NOTE — Progress Notes (Signed)
   Covid-19 Vaccination Clinic  Name:  CULLEY HEDEEN    MRN: [id number] DOB: [birth year]  11/09/2019  Mr. Forstner was observed post Covid-19 immunization for 15 minutes without incidence. He was provided with Vaccine Information Sheet and instruction to access the V-Safe system.   Mr. Christmas was instructed to call 911 with any severe reactions post vaccine: Marland Kitchen Difficulty breathing  . Swelling of your face and throat  . A fast heartbeat  . A bad rash all over your body  . Dizziness and weakness    Immunizations Administered    Name Date Dose VIS Date Route   Pfizer COVID-19 Vaccine 11/09/2019  3:36 PM 0.3 mL 08/22/2019 Intramuscular   Manufacturer: [place]   Lot: [id number]   [place]: [address]-5

## 2019-11-10 ENCOUNTER — Encounter: Payer: Self-pay | Admitting: Internal Medicine

## 2019-12-09 ENCOUNTER — Ambulatory Visit: Payer: Medicare Other | Attending: Internal Medicine

## 2019-12-09 DIAGNOSIS — Z23 Encounter for immunization: Secondary | ICD-10-CM

## 2019-12-09 NOTE — Progress Notes (Signed)
   Covid-19 Vaccination Clinic  Name:  Samuel Ferrell    MRN: [id number] DOB: 1945/10/12  12/09/2019  Mr. Kohler was observed post Covid-19 immunization for 15 minutes without incident. He was provided with Vaccine Information Sheet and instruction to access the V-Safe system.   Mr. Zuercher was instructed to call 911 with any severe reactions post vaccine: Marland Kitchen Difficulty breathing  . Swelling of face and throat  . A fast heartbeat  . A bad rash all over body  . Dizziness and weakness   Immunizations Administered    Name Date Dose VIS Date Route   Pfizer COVID-19 Vaccine 12/09/2019 10:33 AM 0.3 mL 08/22/2019 Intramuscular   Manufacturer: [place]   Lot: [id number]   [place]: [address]-0

## 2019-12-31 ENCOUNTER — Telehealth (INDEPENDENT_AMBULATORY_CARE_PROVIDER_SITE_OTHER): Payer: Medicare Other | Admitting: Internal Medicine

## 2019-12-31 ENCOUNTER — Encounter: Payer: Self-pay | Admitting: Internal Medicine

## 2019-12-31 ENCOUNTER — Other Ambulatory Visit: Payer: Self-pay

## 2019-12-31 VITALS — BP 124/74 | HR 63 | Ht 73.0 in | Wt 187.0 lb

## 2019-12-31 DIAGNOSIS — I4819 Other persistent atrial fibrillation: Secondary | ICD-10-CM | POA: Diagnosis not present

## 2019-12-31 DIAGNOSIS — I119 Hypertensive heart disease without heart failure: Secondary | ICD-10-CM | POA: Diagnosis not present

## 2019-12-31 DIAGNOSIS — I484 Atypical atrial flutter: Secondary | ICD-10-CM

## 2019-12-31 DIAGNOSIS — F1010 Alcohol abuse, uncomplicated: Secondary | ICD-10-CM | POA: Diagnosis not present

## 2019-12-31 DIAGNOSIS — D6869 Other thrombophilia: Secondary | ICD-10-CM

## 2019-12-31 NOTE — Progress Notes (Signed)
Electrophysiology TeleHealth Note  Due to national recommendations of social distancing due to [place] 19, an audio telehealth visit is felt to be most appropriate for this patient at this time.  Verbal consent was obtained by me for the telehealth visit today.  The patient does not have capability for a virtual visit.  A phone visit is therefore required today.   Date:  12/31/2019   ID:  Samuel Ferrell, DOB April 01, 1946, MRN [id number]  Location: patient's home  Provider location:  [place]  Evaluation Performed: Follow-up visit  PCP:  Tisovec, Fransico Him, MD   Electrophysiologist:  Dr Rayann Heman  Chief Complaint:  palpitations  History of Present Illness:    Samuel Ferrell is a 74 y.o. male who presents via telehealth conferencing today.  Since last being seen in our clinic, the patient reports doing very well.  He reports that he gives out very easily after activity.  I have offered stress testing which he declines. Today, he denies symptoms of palpitations, chest pain, shortness of breath,  lower extremity edema, dizziness, presyncope, or syncope.  The patient is otherwise without complaint today.   Past Medical History:  Diagnosis Date  . Afib [facility])    s/p ablation x2 by Dr [name] at [facility] in 2007, 2009  . Alcohol abuse   . Atypical atrial flutter ([place])   . COPD (chronic obstructive pulmonary disease) ([place])    pt denies  . CVA (cerebral infarction)   . Hyperlipidemia   . Hypertension   . Palpitations   . Skin cancer--melanoma   . Stroke [facility])    TIA    Past Surgical History:  Procedure Laterality Date  . ANTERIOR CERVICAL DECOMP/DISCECTOMY FUSION N/A 07/24/2018   Procedure: ANTERIOR CERVICAL DECOMPRESSION/DISCECTOMY FUSION, INTERBODY PROSTHESIS, PLATE SCREWS CERVICAL FIVE- CERVICAL SIX;  Surgeon: Newman Pies, MD;  Location: [facility];  Service: Neurosurgery;  Laterality: N/A;  ANTERIOR CERVICAL DECOMPRESSION/DISCECTOMY FUSION, INTERBODY PROSTHESIS, PLATE SCREWS  CERVICAL FIVE- CERVICAL SIX  . APPENDECTOMY    . ATRIAL FIBRILLATION ABLATION  2007 ,2009   Dr [name] at [place]     right clavicle    Current Outpatient Medications  Medication Sig Dispense Refill  . atorvastatin (LIPITOR) 80 MG tablet Take 80 mg by mouth every evening.     . enalapril (VASOTEC) 20 MG tablet Take 20 mg by mouth at bedtime.     . ferrous sulfate 325 (65 FE) MG tablet Take 325 mg by mouth daily with breakfast.    . finasteride (PROSCAR) 5 MG tablet Take 5 mg by mouth daily.    . flecainide (TAMBOCOR) 50 MG tablet Take 1 tablet (50 mg total) by mouth 2 (two) times daily. Please keep upcoming appt in April with Dr. Rayann Heman before anymore refills. Thank you 180 tablet 0  . metoprolol tartrate (LOPRESSOR) 50 MG tablet Take 1 tablet (50 mg total) by mouth 2 (two) times daily. 180 tablet 2  . omeprazole (PRILOSEC) 20 MG capsule Take 20 mg by mouth daily at 12 noon.     . SUPER B COMPLEX/C CAPS Take 1 capsule by mouth daily.    . verapamil (CALAN) 120 MG tablet Take 120 mg by mouth as needed (afib).     [name] 20 MG TABS tablet Take 20 mg by mouth daily at 6 PM.      No current facility-administered medications for this visit.    Allergies:   Patient has no known allergies.  Social History:  The patient  reports that he has been smoking cigarettes. He has a 35.00 pack-year smoking history. He has never used smokeless tobacco. He reports previous alcohol use. He reports that he does not use drugs.   Family History:  The patient's family history includes Cancer in an other family member.   ROS:  Please see the history of present illness.   All other systems are personally reviewed and negative.    Exam:    Vital Signs:  BP 124/74   Pulse 63   Ht 6\' 1"  (1.854 m)   Wt 187 lb (84.8 kg)   BMI 24.67 kg/m   Well sounding, alert and conversant   Labs/Other Tests and Data Reviewed:    Recent Labs: No results found for requested labs within last  8760 hours.   Wt Readings from Last 3 Encounters:  12/31/19 187 lb (84.8 kg)  11/05/19 196 lb (88.9 kg)  10/27/19 196 lb 4 oz (89 kg)    ASSESSMENT & PLAN:    1.  Persistent afib/ atypical atrial flutter S/p ablation x 2 by Dr Williemae Area  Doing very well with ETOH cessation He has fatigue, which he attributes to metoprolol.  He finds that when he reduces his metoprolol that he has afib. The patient has symptomatic, recurrent atrial fibrillation/ atypical atrial fibrillation.  Chads2vasc score is 4.  he is anticoagulated with xarelto . He has first degree AV block which limits our ability to increase flecainide.  He seems symptomatic with metoprolol.  I have advised that he wean metoprolol and he declines, stating that when he does this he has more afib.  He requires close follow-up to avoid toxicity with flecainide.  Therapeutic strategies for afib including medicine (tikosyn, amiodarone) and ablation were discussed in detail with the patient today. He is clear that he does not wish to have a third ablation but is willing to consider tikosyn.  He will think about this option and followup in the AF clinic for initiation if he decides to proceed.  2. Fatigue/ decreased exercise tolerance Likely due to metoprolol as above I have also advised stress testing which he declines today.  He states "Dr Wynonia Lawman recently did a stress test and it was normal".  I reminded him that Dr Wynonia Lawman has been retired for several years now.  He continues to decline to proceed  3. Hypertensive cardiovascular disease Stable No change required today  Follow-up:  AF clinic in 3 months   Patient Risk:  after full review of this patients clinical status, I feel that they are at moderate risk at this time.  Today, I have spent 15 minutes with the patient with telehealth technology discussing arrhythmia management .    Army Fossa, MD  12/31/2019 10:41 AM     [facility] [address] [place] [address] [phone] (office) [phone] (fax)

## 2020-01-26 ENCOUNTER — Other Ambulatory Visit: Payer: Self-pay | Admitting: Internal Medicine

## 2020-02-27 ENCOUNTER — Telehealth (HOSPITAL_COMMUNITY): Payer: Self-pay | Admitting: *Deleted

## 2020-02-27 NOTE — Telephone Encounter (Signed)
Patient called in stating went into afib about 130 this afternoon - his HR was 139 BP 161/99 - before calling clinic he took 120mg  of verapamil, 50mg  of flecainide and 50mg  of metoprolol. Pt very anxious. Discussed with Adline Peals PA if still out of rhythm at PM meds - he can take extra tablet of metoprolol. He can continue to take extra tablet of metoprolol as needed for elevated HR. Pt can use PRN verapamil daily as needed but would not recommend taking extra flecainide - if still out of rhythm on Monday he will call for office visit. Pt in agreement to plan.

## 2020-03-18 ENCOUNTER — Telehealth: Payer: Self-pay | Admitting: Internal Medicine

## 2020-03-18 NOTE — Telephone Encounter (Signed)
Patient c/o Palpitations:  High priority if patient c/o lightheadedness, shortness of breath, or chest pain  1) How long have you had palpitations/irregular HR/ Afib? Are you having the symptoms now? Since last Friday, but no symptoms so far today   2) Are you currently experiencing lightheadedness, SOB or CP? No   3) Do you have a history of afib (atrial fibrillation) or irregular heart rhythm? Yes   4) Have you checked your BP or HR? (document readings if available):   3:30 AM 130/77 HR 81 6:15 AM 136/76 HR 81 8:45 AM 119/73 HR 76               5) Are you experiencing any other symptoms? No symptoms   Samuel Ferrell is calling stating he has not had any symptoms of Afib today and would like to know if he should reschedule is appointment to be seen with Dr. Rayann Heman instead. He also states when his heart quickly when into Afib he was advised to take metoprolol, flecainide, & verapamil to get it under control and he would like to know if that is something he should do every time this occurs. Please advise

## 2020-03-19 NOTE — Telephone Encounter (Signed)
Returned call to Pt.  Pt states he has been in and out of afib all week.  He is feeling good today.  Pt wanted to know what to take for breakthrough afib.  Advised he should only use the verapamil for breakthrough afib-not to take any extra flecainide or metoprolol  Pt indicates understanding.  Will consider tikosyn.  He will call back if he wants an earlier appt with afib clinic to discuss tx options.

## 2020-03-20 ENCOUNTER — Other Ambulatory Visit (HOSPITAL_COMMUNITY): Payer: Self-pay | Admitting: Nurse Practitioner

## 2020-03-22 ENCOUNTER — Telehealth (HOSPITAL_COMMUNITY): Payer: Self-pay | Admitting: Nurse Practitioner

## 2020-03-22 NOTE — Telephone Encounter (Signed)
Called and left message for patient to call back.  Pt has appt on 03/30/20 with Roderic Palau, NP but is requesting sooner appt to discuss Tikosyn.  Will offer pt appt this week.

## 2020-03-22 NOTE — Telephone Encounter (Signed)
Left message offering earlier appt if pt preferred.

## 2020-03-23 ENCOUNTER — Telehealth: Payer: Self-pay | Admitting: Pharmacist

## 2020-03-23 ENCOUNTER — Other Ambulatory Visit: Payer: Self-pay

## 2020-03-23 ENCOUNTER — Ambulatory Visit (HOSPITAL_COMMUNITY)
Admission: RE | Admit: 2020-03-23 | Discharge: 2020-03-23 | Disposition: A | Discharge status: Home / Self Care | Payer: Medicare Other | Source: Ambulatory Visit | Attending: Nurse Practitioner | Admitting: Nurse Practitioner

## 2020-03-23 ENCOUNTER — Telehealth (HOSPITAL_COMMUNITY): Payer: Self-pay | Admitting: *Deleted

## 2020-03-23 VITALS — BP 110/60 | HR 77 | Ht 73.0 in | Wt 191.8 lb

## 2020-03-23 DIAGNOSIS — I480 Paroxysmal atrial fibrillation: Secondary | ICD-10-CM | POA: Diagnosis not present

## 2020-03-23 DIAGNOSIS — I4891 Unspecified atrial fibrillation: Secondary | ICD-10-CM | POA: Insufficient documentation

## 2020-03-23 DIAGNOSIS — E785 Hyperlipidemia, unspecified: Secondary | ICD-10-CM | POA: Diagnosis not present

## 2020-03-23 DIAGNOSIS — Z981 Arthrodesis status: Secondary | ICD-10-CM | POA: Diagnosis not present

## 2020-03-23 DIAGNOSIS — Z7902 Long term (current) use of antithrombotics/antiplatelets: Secondary | ICD-10-CM | POA: Insufficient documentation

## 2020-03-23 DIAGNOSIS — I484 Atypical atrial flutter: Secondary | ICD-10-CM | POA: Diagnosis not present

## 2020-03-23 DIAGNOSIS — Z79899 Other long term (current) drug therapy: Secondary | ICD-10-CM | POA: Insufficient documentation

## 2020-03-23 DIAGNOSIS — F17210 Nicotine dependence, cigarettes, uncomplicated: Secondary | ICD-10-CM | POA: Insufficient documentation

## 2020-03-23 DIAGNOSIS — J449 Chronic obstructive pulmonary disease, unspecified: Secondary | ICD-10-CM | POA: Diagnosis not present

## 2020-03-23 DIAGNOSIS — Z85820 Personal history of malignant melanoma of skin: Secondary | ICD-10-CM | POA: Diagnosis not present

## 2020-03-23 DIAGNOSIS — I4892 Unspecified atrial flutter: Secondary | ICD-10-CM | POA: Diagnosis not present

## 2020-03-23 DIAGNOSIS — D6869 Other thrombophilia: Secondary | ICD-10-CM

## 2020-03-23 DIAGNOSIS — I10 Essential (primary) hypertension: Secondary | ICD-10-CM | POA: Insufficient documentation

## 2020-03-23 LAB — BASIC METABOLIC PANEL
Anion gap: 9 (ref 5–15)
BUN: 24 mg/dL — ABNORMAL HIGH (ref 8–23)
CO2: 23 mmol/L (ref 22–32)
Calcium: 8.9 mg/dL (ref 8.9–10.3)
Chloride: 105 mmol/L (ref 98–111)
Creatinine, Ser: 1.33 mg/dL — ABNORMAL HIGH (ref 0.61–1.24)
GFR calc Af Amer: >60 mL/min (ref >60)
GFR calc non Af Amer: 53 mL/min — ABNORMAL LOW (ref >60)
Glucose, Bld: 98 mg/dL (ref 70–99)
Potassium: 4.9 mmol/L (ref 3.5–5.1)
Sodium: 137 mmol/L (ref 135–145)

## 2020-03-23 LAB — MAGNESIUM: Magnesium: 2.1 mg/dL (ref 1.7–2.4)

## 2020-03-23 NOTE — Patient Instructions (Signed)
Use extra 1/2 metoprolol as needed for afib after Friday    NO verapamil or flecainide after Friday

## 2020-03-23 NOTE — Progress Notes (Signed)
Primary Care Physician: Haywood Pao, MD Referring Physician: Dr. Desiree Hane is a 75 y.o. male with a h/o ablation x 2 at [facility] in 2007/2009, alcohol abuse,( none for the last year)  atypical atrial flutter and afib that is here today to change afib management as he has started  having high afib burden on flecainide. He wants to discuss tikosyn admission as per prior discussion with Dr. Rayann Heman. He wants to have this done as soon as possible as he plans to go to [facility] 8/15 and would like to feel good while on vacation. He will have to stop  flecainide and verapamil 3 days prior to admit. No benadryl use, has not missed any anticoagulation for at least 3 weeks. CHA2DS2VASc score of at least 3. Will check on the price of the drug.   Today, he denies symptoms of palpitations, chest pain, shortness of breath, orthopnea, PND, lower extremity edema, dizziness, presyncope, syncope, or neurologic sequela. The patient is tolerating medications without difficulties and is otherwise without complaint today.   Past Medical History:  Diagnosis Date  . Afib [facility])    s/p ablation x2 by Dr [name] at [facility] in 2007, 2009  . Alcohol abuse   . Atypical atrial flutter ([place])   . COPD (chronic obstructive pulmonary disease) ([place])    pt denies  . CVA (cerebral infarction)   . Hyperlipidemia   . Hypertension   . Palpitations   . Skin cancer--melanoma   . Stroke [facility])    TIA   Past Surgical History:  Procedure Laterality Date  . ANTERIOR CERVICAL DECOMP/DISCECTOMY FUSION N/A 07/24/2018   Procedure: ANTERIOR CERVICAL DECOMPRESSION/DISCECTOMY FUSION, INTERBODY PROSTHESIS, PLATE SCREWS CERVICAL FIVE- CERVICAL SIX;  Surgeon: Newman Pies, MD;  Location: [facility];  Service: Neurosurgery;  Laterality: N/A;  ANTERIOR CERVICAL DECOMPRESSION/DISCECTOMY FUSION, INTERBODY PROSTHESIS, PLATE SCREWS CERVICAL FIVE- CERVICAL SIX  . APPENDECTOMY    . ATRIAL FIBRILLATION ABLATION  2007 ,2009    Dr [name] at [place]     right clavicle    Current Outpatient Medications  Medication Sig Dispense Refill  . atorvastatin (LIPITOR) 80 MG tablet Take 80 mg by mouth every evening.     . enalapril (VASOTEC) 20 MG tablet Take 20 mg by mouth at bedtime.     . ferrous sulfate 325 (65 FE) MG tablet Take 325 mg by mouth daily with breakfast.    . finasteride (PROSCAR) 5 MG tablet Take 5 mg by mouth daily.    . flecainide (TAMBOCOR) 50 MG tablet Take 1 tablet (50 mg total) by mouth 2 (two) times daily. 180 tablet 3  . metoprolol tartrate (LOPRESSOR) 50 MG tablet TAKE 1 TABLET BY MOUTH TWICE A DAY 180 tablet 2  . omeprazole (PRILOSEC) 20 MG capsule Take 20 mg by mouth daily at 12 noon.     . SUPER B COMPLEX/C CAPS Take 1 capsule by mouth daily.    . verapamil (CALAN) 120 MG tablet Take 120 mg by mouth as needed (afib).     [name] 20 MG TABS tablet Take 20 mg by mouth daily at 6 PM.      No current facility-administered medications for this encounter.    No Known Allergies  Social History   Socioeconomic History  . Marital status: Significant Other    Spouse name: Not on file  . Number of children: 2  . Years of education: Not on file  . Highest education  level: Not on file  Occupational History  . Occupation: Retired  Tobacco Use  . Smoking status: Current Every Day Smoker    Packs/day: 1.00    Years: 35.00    Pack years: 35.00    Types: Cigarettes  . Smokeless tobacco: Never Used  . Tobacco comment: pack a day  Vaping Use  . Vaping Use: Never used  Substance and Sexual Activity  . Alcohol use: Not Currently    Comment: heavy, up to 6 beers per day  . Drug use: No  . Sexual activity: Not on file  Other Topics Concern  . Not on file  Social History Narrative   Lives in [place].  Works as a Lobbyist Strain:   . Difficulty of Paying Living Expenses:   Food Insecurity:    . Worried About Charity fundraiser in the Last Year:   . Arboriculturist in the Last Year:   Transportation Needs:   . Film/video editor (Medical):   Marland Kitchen Lack of Transportation (Non-Medical):   Physical Activity:   . Days of Exercise per Week:   . Minutes of Exercise per Session:   Stress:   . Feeling of Stress :   Social Connections:   . Frequency of Communication with Friends and Family:   . Frequency of Social Gatherings with Friends and Family:   . Attends Religious Services:   . Active Member of Clubs or Organizations:   . Attends Archivist Meetings:   Marland Kitchen Marital Status:   Intimate Partner Violence:   . Fear of Current or Ex-Partner:   . Emotionally Abused:   Marland Kitchen Physically Abused:   . Sexually Abused:     Family History  Problem Relation Age of Onset  . Cancer Other   . Colon cancer Neg Hx   . Esophageal cancer Neg Hx   . Rectal cancer Neg Hx   . Stomach cancer Neg Hx     ROS- All systems are reviewed and negative except as per the HPI above  Physical Exam: Vitals:   03/23/20 0924  BP: 110/60  Pulse: 77  Weight: 87 kg  Height: 6\' 1"  (1.854 m)   Wt Readings from Last 3 Encounters:  03/23/20 87 kg  12/31/19 84.8 kg  11/05/19 88.9 kg    Labs: Lab Results  Component Value Date   NA 136 07/19/2018   K 4.1 07/19/2018   CL 103 07/19/2018   CO2 26 07/19/2018   GLUCOSE 100 (H) 07/19/2018   BUN 12 07/19/2018   CREATININE 1.00 07/19/2018   CALCIUM 9.1 07/19/2018   Lab Results  Component Value Date   INR 1.07 06/25/2018   Lab Results  Component Value Date   CHOL 129 06/26/2018   HDL 34 (L) 06/26/2018   LDLCALC 77 06/26/2018   TRIG 89 06/26/2018     GEN- The patient is well appearing, alert and oriented x 3 today.   Head- normocephalic, atraumatic Eyes-  Sclera clear, conjunctiva pink Ears- hearing intact Oropharynx- clear Neck- supple, no JVP Lymph- no cervical lymphadenopathy Lungs- Clear to ausculation bilaterally, normal work  of breathing Heart- irregular rate and rhythm, no murmurs, rubs or gallops, PMI not laterally displaced GI- soft, NT, ND, + BS Extremities- no clubbing, cyanosis, or edema MS- no significant deformity or atrophy Skin- no rash or lesion Psych- euthymic mood, full affect Neuro- strength and sensation are intact  EKG-atypical atrial  flutter at 77 bpm, qrs int 80 ms, qtc 420 ms (423 ms in SR)     Assessment and Plan: 1. Afib/flutter Flecainide is failing to keep pt in rhythm  He wants to porceed to admission with Tikosyn loading  This will be scheduled 7/20 He will stop flecainide and verapamil 3 days prior to hospitalization No benadryl use. Will check on price of drug  If he has RVR sfter stopping flecainide/verapamil, he can use 1/2 tab of metoprolol  Bmet/mag today   2. CHA2DS2VASc of at  least 3 States no missed doses of xarelto 20 mg daily and reminded not to miss any going forward to prepare for admit   Return for admission [place]. Debbie Yearick, [facility] [address] [place],  [address] [phone]

## 2020-03-23 NOTE — Telephone Encounter (Signed)
Medication list reviewed in anticipation of upcoming Tikosyn initiation. Patient is taking two contraindicated or QTc prolonging medications. Patient will need to stop taking verapamil and flecainide at least 3 days prior to starting Tikosyn.  Patient is anticoagulated on Xarelto on the appropriate dose. Please ensure that patient has not missed any anticoagulation doses in the 3 weeks prior to Tikosyn initiation.   Patient will need to be counseled to avoid use of Benadryl while on Tikosyn and in the 2-3 days prior to Tikosyn initiation.

## 2020-03-23 NOTE — Telephone Encounter (Signed)
Patient to use [employer] card for dofetilide prescription after discharge from hospital admission. Cost via insurance would be $130 for a 3 month supply.

## 2020-03-26 ENCOUNTER — Other Ambulatory Visit (HOSPITAL_COMMUNITY)
Admission: RE | Admit: 2020-03-26 | Discharge: 2020-03-26 | Disposition: A | Discharge status: Home / Self Care | Payer: Medicare Other | Source: Ambulatory Visit | Attending: Nurse Practitioner | Admitting: Nurse Practitioner

## 2020-03-26 DIAGNOSIS — Z01812 Encounter for preprocedural laboratory examination: Secondary | ICD-10-CM | POA: Diagnosis present

## 2020-03-26 DIAGNOSIS — Z20822 Contact with and (suspected) exposure to covid-19: Secondary | ICD-10-CM | POA: Diagnosis not present

## 2020-03-26 LAB — SARS CORONAVIRUS 2 (TAT 6-24 HRS): SARS Coronavirus 2: NEGATIVE

## 2020-03-30 ENCOUNTER — Encounter (HOSPITAL_COMMUNITY): Payer: Self-pay | Admitting: Nurse Practitioner

## 2020-03-30 ENCOUNTER — Inpatient Hospital Stay (HOSPITAL_COMMUNITY)
Admission: AD | Admit: 2020-03-30 | Discharge: 2020-04-02 | DRG: 310 | Disposition: A | Discharge status: Home / Self Care | Payer: Medicare Other | Source: Ambulatory Visit | Attending: Cardiology | Admitting: Cardiology

## 2020-03-30 ENCOUNTER — Other Ambulatory Visit: Payer: Self-pay

## 2020-03-30 ENCOUNTER — Ambulatory Visit (HOSPITAL_COMMUNITY): Payer: Medicare Other | Admitting: Nurse Practitioner

## 2020-03-30 ENCOUNTER — Encounter (HOSPITAL_COMMUNITY): Payer: Self-pay | Admitting: Cardiology

## 2020-03-30 ENCOUNTER — Ambulatory Visit (HOSPITAL_COMMUNITY)
Admission: RE | Admit: 2020-03-30 | Discharge: 2020-03-30 | Disposition: A | Discharge status: Home / Self Care | Payer: Medicare Other | Source: Ambulatory Visit | Attending: Nurse Practitioner | Admitting: Nurse Practitioner

## 2020-03-30 VITALS — BP 130/64 | HR 96 | Ht 73.0 in | Wt 191.6 lb

## 2020-03-30 DIAGNOSIS — I480 Paroxysmal atrial fibrillation: Secondary | ICD-10-CM | POA: Diagnosis not present

## 2020-03-30 DIAGNOSIS — Z79899 Other long term (current) drug therapy: Secondary | ICD-10-CM | POA: Diagnosis not present

## 2020-03-30 DIAGNOSIS — Z85820 Personal history of malignant melanoma of skin: Secondary | ICD-10-CM

## 2020-03-30 DIAGNOSIS — I4819 Other persistent atrial fibrillation: Principal | ICD-10-CM | POA: Diagnosis present

## 2020-03-30 DIAGNOSIS — F1011 Alcohol abuse, in remission: Secondary | ICD-10-CM | POA: Diagnosis present

## 2020-03-30 DIAGNOSIS — Z8673 Personal history of transient ischemic attack (TIA), and cerebral infarction without residual deficits: Secondary | ICD-10-CM

## 2020-03-30 DIAGNOSIS — F17210 Nicotine dependence, cigarettes, uncomplicated: Secondary | ICD-10-CM | POA: Diagnosis present

## 2020-03-30 DIAGNOSIS — I10 Essential (primary) hypertension: Secondary | ICD-10-CM | POA: Diagnosis present

## 2020-03-30 DIAGNOSIS — Z7901 Long term (current) use of anticoagulants: Secondary | ICD-10-CM

## 2020-03-30 DIAGNOSIS — D6869 Other thrombophilia: Secondary | ICD-10-CM

## 2020-03-30 DIAGNOSIS — Z981 Arthrodesis status: Secondary | ICD-10-CM | POA: Diagnosis not present

## 2020-03-30 DIAGNOSIS — I484 Atypical atrial flutter: Secondary | ICD-10-CM | POA: Diagnosis present

## 2020-03-30 DIAGNOSIS — E785 Hyperlipidemia, unspecified: Secondary | ICD-10-CM | POA: Diagnosis present

## 2020-03-30 LAB — BASIC METABOLIC PANEL
Anion gap: 8 (ref 5–15)
BUN: 26 mg/dL — ABNORMAL HIGH (ref 8–23)
CO2: 26 mmol/L (ref 22–32)
Calcium: 9.1 mg/dL (ref 8.9–10.3)
Chloride: 105 mmol/L (ref 98–111)
Creatinine, Ser: 1.12 mg/dL (ref 0.61–1.24)
GFR calc Af Amer: >60 mL/min (ref >60)
GFR calc non Af Amer: >60 mL/min (ref >60)
Glucose, Bld: 100 mg/dL — ABNORMAL HIGH (ref 70–99)
Potassium: 4.2 mmol/L (ref 3.5–5.1)
Sodium: 139 mmol/L (ref 135–145)

## 2020-03-30 LAB — MAGNESIUM: Magnesium: 2 mg/dL (ref 1.7–2.4)

## 2020-03-30 MED ORDER — SODIUM CHLORIDE 0.9% FLUSH: 3.0000 mL | INTRAVENOUS | Status: DC | PRN

## 2020-03-30 MED ORDER — FERROUS SULFATE 325 (65 FE) MG PO TABS
325.0000 mg | ORAL_TABLET | Freq: Every day | ORAL | Status: DC
  Administered 2020-03-31 – 2020-04-02 (×3): 325 mg via ORAL
  Filled 2020-03-30 (×3): qty 1

## 2020-03-30 MED ORDER — FINASTERIDE 5 MG PO TABS: 5.0000 mg | ORAL_TABLET | Freq: Every day | ORAL | Status: DC

## 2020-03-30 MED ORDER — ATORVASTATIN CALCIUM 80 MG PO TABS: 80.0000 mg | ORAL_TABLET | Freq: Every evening | ORAL | Status: DC

## 2020-03-30 MED ORDER — RIVAROXABAN 20 MG PO TABS
20.0000 mg | ORAL_TABLET | Freq: Every day | ORAL | Status: DC
  Administered 2020-03-30 – 2020-04-01 (×3): 20 mg via ORAL
  Filled 2020-03-30 (×3): qty 1

## 2020-03-30 MED ORDER — SODIUM CHLORIDE 0.9 % IV SOLN: 250.0000 mL | INTRAVENOUS | Status: DC | PRN

## 2020-03-30 MED ORDER — METOPROLOL TARTRATE 50 MG PO TABS
50.0000 mg | ORAL_TABLET | Freq: Two times a day (BID) | ORAL | Status: DC
  Administered 2020-03-30: 50 mg via ORAL
  Filled 2020-03-30 (×2): qty 1

## 2020-03-30 MED ORDER — FINASTERIDE 5 MG PO TABS
5.0000 mg | ORAL_TABLET | Freq: Every day | ORAL | Status: DC
  Administered 2020-03-30 – 2020-04-01 (×3): 5 mg via ORAL
  Filled 2020-03-30 (×3): qty 1

## 2020-03-30 MED ORDER — SODIUM CHLORIDE 0.9% FLUSH
3.0000 mL | Freq: Two times a day (BID) | INTRAVENOUS | Status: DC
  Administered 2020-03-30 – 2020-04-01 (×6): 3 mL via INTRAVENOUS

## 2020-03-30 MED ORDER — ATORVASTATIN CALCIUM 80 MG PO TABS
80.0000 mg | ORAL_TABLET | Freq: Every day | ORAL | Status: DC
  Administered 2020-03-30 – 2020-04-01 (×3): 80 mg via ORAL
  Filled 2020-03-30 (×3): qty 1

## 2020-03-30 MED ORDER — DOFETILIDE 500 MCG PO CAPS
500.0000 ug | ORAL_CAPSULE | Freq: Two times a day (BID) | ORAL | Status: DC
  Administered 2020-03-30 – 2020-04-02 (×6): 500 ug via ORAL
  Filled 2020-03-30 (×6): qty 1

## 2020-03-30 MED ORDER — MAGNESIUM SULFATE 2 GM/50ML IV SOLN
2.0000 g | Freq: Once | INTRAVENOUS | Status: AC
  Administered 2020-03-30: 2 g via INTRAVENOUS
  Filled 2020-03-30: qty 50

## 2020-03-30 MED ORDER — PANTOPRAZOLE SODIUM 40 MG PO TBEC
40.0000 mg | DELAYED_RELEASE_TABLET | Freq: Every day | ORAL | Status: DC
  Administered 2020-03-31 – 2020-04-02 (×3): 40 mg via ORAL
  Filled 2020-03-30 (×3): qty 1

## 2020-03-30 MED ORDER — ENALAPRIL MALEATE 20 MG PO TABS
20.0000 mg | ORAL_TABLET | Freq: Every day | ORAL | Status: DC
  Administered 2020-03-30 – 2020-04-01 (×3): 20 mg via ORAL
  Filled 2020-03-30 (×4): qty 1

## 2020-03-30 NOTE — Progress Notes (Signed)
Pharmacy: Dofetilide (Tikosyn) - Initial Consult Assessment and Electrolyte Replacement  Pharmacy consulted to assist in monitoring and replacing electrolytes in this 74 y.o. male admitted on 03/30/2020 undergoing dofetilide initiation. First dofetilide dose: 03/30/2020 @2000   Assessment:  Patient Exclusion Criteria: If any screening criteria checked as "Yes", then  patient  should NOT receive dofetilide until criteria item is corrected.  If "Yes" please indicate correction plan.  YES  NO Patient  Exclusion Criteria Correction Plan   []   [x]   Baseline QTc interval is greater than or equal to 440 msec. IF above YES box checked dofetilide contraindicated unless patient has ICD; then may proceed if QTc 500-550 msec or with known ventricular conduction abnormalities may proceed with QTc 550-600 msec. QTc = 0.54    []   [x]   Patient is known or suspected to have a digoxin level greater than 2 ng/ml: No results found for: DIGOXIN     []   [x]   Creatinine clearance less than 20 ml/min (calculated using Cockcroft-Gault, actual body weight and serum creatinine): Estimated Creatinine Clearance: 67.4 mL/min (by C-G formula based on SCr of 1.12 mg/dL).     []   [x]  Patient has received drugs known to prolong the QT intervals within the last 48 hours (phenothiazines, tricyclics or tetracyclic antidepressants, erythromycin, H-1 antihistamines, cisapride, fluoroquinolones, azithromycin). Drugs not listed above may have an, as yet, undetected potential to prolong the QT interval, updated information on QT prolonging agents is available at this website:QT prolonging agents or www.crediblemeds.org    []   [x]   Patient received a dose of hydrochlorothiazide (Oretic) alone or in any combination including triamterene (Dyazide, Maxzide) in the last 48 hours.    []   [x]  Patient received a medication known to increase dofetilide plasma concentrations prior to initial dofetilide dose:  . Trimethoprim  (Primsol, Proloprim) in the last 36 hours . Verapamil (Calan, Verelan) in the last 36 hours or a sustained release dose in the last 72 hours . Megestrol (Megace) in the last 5 days  . Cimetidine (Tagamet) in the last 6 hours . Ketoconazole (Nizoral) in the last 24 hours . Itraconazole (Sporanox) in the last 48 hours  . Prochlorperazine (Compazine) in the last 36 hours     []   [x]   Patient is known to have a history of torsades de pointes; congenital or acquired long QT syndromes.    []   [x]   Patient has received a Class 1 antiarrhythmic with less than 2 half-lives since last dose. (Disopyramide, Quinidine, Procainamide, Lidocaine, Mexiletine, Flecainide, Propafenone) Pt has been off of verapamil and flecainide since Friday 7/16.    []   [x]   Patient has received amiodarone therapy in the past 3 months or amiodarone level is greater than 0.3 ng/ml.    Patient has been appropriately anticoagulated with xarelto 20mg  by mouth daily.  Labs:    Component Value Date/Time   K 4.2 03/30/2020 1217   MG 2.0 03/30/2020 1217     Plan:  Potassium: K >/= 4: Appropriate to initiate Tikosyn, no replacement needed    Magnesium: Mg 1.8-2: Give Mg 2 gm IV x1 to prevent Mg from dropping below 1.8 - do not need to recheck Mg. Appropriate to initiate Tikosyn    Thank you for allowing pharmacy to participate in this patient's care   Wilson Singer, PharmD PGY1 Pharmacy Resident 03/30/2020 1:42 PM

## 2020-03-30 NOTE — Progress Notes (Signed)
Primary Care Physician: Haywood Pao, MD Referring Physician: Dr. Desiree Hane is a 74 y.o. male with a h/o ablation x 2 at [facility] in 2007/2009, alcohol abuse,( none for the last year)  atypical atrial flutter and afib that is here today to change afib management as he has started  having high afib burden on flecainide. He wants to discuss tikosyn admission as per prior discussion with Dr. Rayann Heman. He wants to have this done as soon as possible as he plans to go to [facility] 8/15 and would like to feel good while on vacation. He will have to stop  flecainide and verapamil 3 days prior to admit. No benadryl use, has not missed any anticoagulation for at least 3 weeks. CHA2DS2VASc score of at least 3. Will check on the price of the drug.  F/u in afib clinic 7/20 for  admission for tikosyn. He has not had any verapamil or flecainide since Friday. No missed anticoagulation. He can get drug thru his medicare for $ 150 for 3 months. but wishes to use good rx as he will be able to get the drug for $50 for 3 months.  No benadryl use.  Qtc today is 418 ms with afib at 86 bpm.   Today, he denies symptoms of palpitations, chest pain, shortness of breath, orthopnea, PND, lower extremity edema, dizziness, presyncope, syncope, or neurologic sequela. The patient is tolerating medications without difficulties and is otherwise without complaint today.   Past Medical History:  Diagnosis Date  . Afib [facility])    s/p ablation x2 by Dr [name] at [facility] in 2007, 2009  . Alcohol abuse   . Atypical atrial flutter ([place])   . COPD (chronic obstructive pulmonary disease) ([place])    pt denies  . CVA (cerebral infarction)   . Hyperlipidemia   . Hypertension   . Palpitations   . Skin cancer--melanoma   . Stroke [facility])    TIA   Past Surgical History:  Procedure Laterality Date  . ANTERIOR CERVICAL DECOMP/DISCECTOMY FUSION N/A 07/24/2018   Procedure: ANTERIOR CERVICAL DECOMPRESSION/DISCECTOMY  FUSION, INTERBODY PROSTHESIS, PLATE SCREWS CERVICAL FIVE- CERVICAL SIX;  Surgeon: Newman Pies, MD;  Location: [place];  Service: Neurosurgery;  Laterality: N/A;  ANTERIOR CERVICAL DECOMPRESSION/DISCECTOMY FUSION, INTERBODY PROSTHESIS, PLATE SCREWS CERVICAL FIVE- CERVICAL SIX  . APPENDECTOMY    . ATRIAL FIBRILLATION ABLATION  2007 ,2009   Dr [name] at [place]     right clavicle    Current Outpatient Medications  Medication Sig Dispense Refill  . atorvastatin (LIPITOR) 80 MG tablet Take 80 mg by mouth every evening.     . enalapril (VASOTEC) 20 MG tablet Take 20 mg by mouth at bedtime.     . ferrous sulfate 325 (65 FE) MG tablet Take 325 mg by mouth daily with breakfast.    . finasteride (PROSCAR) 5 MG tablet Take 5 mg by mouth daily.    . metoprolol tartrate (LOPRESSOR) 50 MG tablet TAKE 1 TABLET BY MOUTH TWICE A DAY 180 tablet 2  . omeprazole (PRILOSEC) 20 MG capsule Take 20 mg by mouth daily at 12 noon.     . SUPER B COMPLEX/C CAPS Take 1 capsule by mouth daily.    [name] 20 MG TABS tablet Take 20 mg by mouth daily at 6 PM.      No current facility-administered medications for this encounter.   Facility-Administered Medications Ordered in Other Encounters  Medication Dose Route Frequency  Provider Last Rate Last Admin  . atorvastatin (LIPITOR) tablet 80 mg  80 mg Oral QPM Sherran Needs, NP      . enalapril (VASOTEC) tablet 20 mg  20 mg Oral QHS Sherran Needs, NP      . Derrill Memo ON 03/31/2020] ferrous sulfate tablet 325 mg  325 mg Oral Q breakfast Sherran Needs, NP      . Derrill Memo ON 03/31/2020] finasteride (PROSCAR) tablet 5 mg  5 mg Oral Daily Sherran Needs, NP      . metoprolol tartrate (LOPRESSOR) tablet 50 mg  50 mg Oral BID Sherran Needs, NP      . pantoprazole (PROTONIX) EC tablet 40 mg  40 mg Oral Daily Sherran Needs, NP        No Known Allergies  Social History   Socioeconomic History  . Marital status: Significant Other    Spouse  name: Not on file  . Number of children: 2  . Years of education: Not on file  . Highest education level: Not on file  Occupational History  . Occupation: Retired  Tobacco Use  . Smoking status: Current Every Day Smoker    Packs/day: 1.00    Years: 35.00    Pack years: 35.00    Types: Cigarettes  . Smokeless tobacco: Never Used  . Tobacco comment: pack a day  Vaping Use  . Vaping Use: Never used  Substance and Sexual Activity  . Alcohol use: Not Currently    Comment: heavy, up to 6 beers per day  . Drug use: No  . Sexual activity: Not on file  Other Topics Concern  . 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 Difficulty of Paying Living Expenses:   Food Insecurity:   . Worried About Charity fundraiser in the Last Year:   . Arboriculturist in the Last Year:   Transportation Needs:   . Film/video editor (Medical):   Marland Kitchen Lack of Transportation (Non-Medical):   Physical Activity:   . Days of Exercise per Week:   . Minutes of Exercise per Session:   Stress:   . Feeling of Stress :   Social Connections:   . Frequency of Communication with Friends and Family:   . Frequency of Social Gatherings with Friends and Family:   . Attends Religious Services:   . Active Member of Clubs or Organizations:   . Attends Archivist Meetings:   Marland Kitchen Marital Status:   Intimate Partner Violence:   . Fear of Current or Ex-Partner:   . Emotionally Abused:   Marland Kitchen Physically Abused:   . Sexually Abused:     Family History  Problem Relation Age of Onset  . Cancer Other   . Colon cancer Neg Hx   . Esophageal cancer Neg Hx   . Rectal cancer Neg Hx   . Stomach cancer Neg Hx     ROS- All systems are reviewed and negative except as per the HPI above  Physical Exam: Vitals:   03/30/20 1135  BP: 130/64  Pulse: 96  SpO2: 99%  Weight: 86.9 kg  Height: 6\' 1"   (1.854 m)   Wt Readings from Last 3 Encounters:  03/30/20 86.9 kg  03/23/20 87 kg  12/31/19 84.8 kg    Labs: Lab Results  Component Value Date  NA 139 03/30/2020   K 4.2 03/30/2020   CL 105 03/30/2020   CO2 26 03/30/2020   GLUCOSE 100 (H) 03/30/2020   BUN 26 (H) 03/30/2020   CREATININE 1.12 03/30/2020   CALCIUM 9.1 03/30/2020   MG 2.0 03/30/2020   Lab Results  Component Value Date   INR 1.07 06/25/2018   Lab Results  Component Value Date   CHOL 129 06/26/2018   HDL 34 (L) 06/26/2018   LDLCALC 77 06/26/2018   TRIG 89 06/26/2018     GEN- The patient is well appearing, alert and oriented x 3 today.   Head- normocephalic, atraumatic Eyes-  Sclera clear, conjunctiva pink Ears- hearing intact Oropharynx- clear Neck- supple, no JVP Lymph- no cervical lymphadenopathy Lungs- Clear to ausculation bilaterally, normal work of breathing Heart- irregular rate and rhythm, no murmurs, rubs or gallops, PMI not laterally displaced GI- soft, NT, ND, + BS Extremities- no clubbing, cyanosis, or edema MS- no significant deformity or atrophy Skin- no rash or lesion Psych- euthymic mood, full affect Neuro- strength and sensation are intact  EKG- afib at 86 bpm, qrs int 78 ms, qtc 418 ms   Assessment and Plan: 1. Afib/flutter Flecainide is failed to keep pt in rhythm   he has been off flecainide and verapamil since last Friday  No benadryl use  He wants to use the [employer] on [address] for his Good RX dofetilide  on d/c Bmet/mag pending  Covid test negative He has had both covid shots  crcl cal at 62.10 so he will qualify for the 500 mcg dofetilide  bid dose   2. CHA2DS2VASc of at  least 3 States no missed doses of xarelto 20 mg daily   To 6E when bed available   Butch Penny C. Joanette Silveria, [facility] [address] [place], [place] [address] [phone]

## 2020-03-30 NOTE — H&P (Signed)
Primary Care Physician: Haywood Pao, MD Referring Physician: Dr. Desiree Ferrell is a 74 y.o. male with a h/o ablation x 2 at [facility] in 2007/2009, alcohol abuse,( none for the last year)  atypical atrial flutter and afib that is here today to change afib management as he has started  having high afib burden on flecainide. He wants to discuss tikosyn admission as per prior discussion with Dr. Rayann Heman. He wants to have this done as soon as possible as he plans to go to [facility] 8/15 and would like to feel good while on vacation. He Samuel Ferrell have to stop  flecainide and verapamil 3 days prior to admit. No benadryl use, has not missed any anticoagulation for at least 3 weeks. CHA2DS2VASc score of at least 3. Samuel Ferrell check on the price of the drug.  F/u in afib clinic 7/20 for  admission for tikosyn. He has not had any verapamil or flecainide since Friday. No missed anticoagulation. He can get drug thru his medicare for $ 150 for 3 months. but wishes to use good rx as he Samuel Ferrell be able to get the drug for $50 for 3 months.  No benadryl use.  Qtc today is 418 ms with afib at 86 bpm.   Today, he denies symptoms of palpitations, chest pain, shortness of breath, orthopnea, PND, lower extremity edema, dizziness, presyncope, syncope, or neurologic sequela. The patient is tolerating medications without difficulties and is otherwise without complaint today.   Past Medical History:  Diagnosis Date  . Afib [facility])    s/p ablation x2 by Dr [name] at [facility] in 2007, 2009  . Alcohol abuse   . Atypical atrial flutter ([place])   . COPD (chronic obstructive pulmonary disease) ([place])    pt denies  . CVA (cerebral infarction)   . Hyperlipidemia   . Hypertension   . Palpitations   . Skin cancer--melanoma   . Stroke [facility])    TIA   Past Surgical History:  Procedure Laterality Date  . ANTERIOR CERVICAL DECOMP/DISCECTOMY FUSION N/A 07/24/2018   Procedure: ANTERIOR CERVICAL DECOMPRESSION/DISCECTOMY  FUSION, INTERBODY PROSTHESIS, PLATE SCREWS CERVICAL FIVE- CERVICAL SIX;  Surgeon: Newman Pies, MD;  Location: [place];  Service: Neurosurgery;  Laterality: N/A;  ANTERIOR CERVICAL DECOMPRESSION/DISCECTOMY FUSION, INTERBODY PROSTHESIS, PLATE SCREWS CERVICAL FIVE- CERVICAL SIX  . APPENDECTOMY    . ATRIAL FIBRILLATION ABLATION  2007 ,2009   Dr [name] at [place]     right clavicle    Current Facility-Administered Medications  Medication Dose Route Frequency Provider Last Rate Last Admin  . 0.9 %  sodium chloride infusion  250 mL Intravenous PRN Sherran Needs, NP      . atorvastatin (LIPITOR) tablet 80 mg  80 mg Oral QPM Sherran Needs, NP      . dofetilide (TIKOSYN) capsule 500 mcg  500 mcg Oral BID Sherran Needs, NP      . enalapril (VASOTEC) tablet 20 mg  20 mg Oral QHS Sherran Needs, NP      . Derrill Memo ON 03/31/2020] ferrous sulfate tablet 325 mg  325 mg Oral Q breakfast Sherran Needs, NP      . finasteride (PROSCAR) tablet 5 mg  5 mg Oral q1800 Sherran Needs, NP      . metoprolol tartrate (LOPRESSOR) tablet 50 mg  50 mg Oral BID Sherran Needs, NP      . pantoprazole (PROTONIX) EC tablet 40 mg  40  mg Oral Daily Sherran Needs, NP      . rivaroxaban Samuel Ferrell) tablet 20 mg  20 mg Oral Q supper Samuel Palau C, NP      . sodium chloride flush (NS) 0.9 % injection 3 mL  3 mL Intravenous Q12H Sherran Needs, NP   3 mL at 03/30/20 1342  . sodium chloride flush (NS) 0.9 % injection 3 mL  3 mL Intravenous PRN Sherran Needs, NP        No Known Allergies  Social History   Socioeconomic History  . Marital status: Significant Other    Spouse name: Not on file  . Number of children: 2  . Years of education: Not on file  . Highest education level: Not on file  Occupational History  . Occupation: Retired  Tobacco Use  . Smoking status: Current Every Day Smoker    Packs/day: 1.00    Years: 35.00    Pack years: 35.00    Types: Cigarettes  .  Smokeless tobacco: Never Used  . Tobacco comment: pack a day  Vaping Use  . Vaping Use: Never used  Substance and Sexual Activity  . Alcohol use: Not Currently    Comment: heavy, up to 6 beers per day  . Drug use: No  . Sexual activity: Yes  Other Topics Concern  . 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 Difficulty of Paying Living Expenses:   Food Insecurity:   . Worried About Charity fundraiser in the Last Year:   . Arboriculturist in the Last Year:   Transportation Needs:   . Film/video editor (Medical):   Marland Kitchen Lack of Transportation (Non-Medical):   Physical Activity:   . Days of Exercise per Week:   . Minutes of Exercise per Session:   Stress:   . Feeling of Stress :   Social Connections:   . Frequency of Communication with Friends and Family:   . Frequency of Social Gatherings with Friends and Family:   . Attends Religious Services:   . Active Member of Clubs or Organizations:   . Attends Archivist Meetings:   Marland Kitchen Marital Status:   Intimate Partner Violence:   . Fear of Current or Ex-Partner:   . Emotionally Abused:   Marland Kitchen Physically Abused:   . Sexually Abused:     Family History  Problem Relation Age of Onset  . Cancer Other   . Colon cancer Neg Hx   . Esophageal cancer Neg Hx   . Rectal cancer Neg Hx   . Stomach cancer Neg Hx     ROS- All systems are reviewed and negative except as per the HPI above  Physical Exam: Vitals:   03/30/20 1316 03/30/20 1541  BP: (!) 109/54 (!) 95/58  Pulse: 88 83  Resp: 17 16  Temp: (!) 97.4 F (36.3 Ferrell) 98.6 F (37 Ferrell)  TempSrc: Oral Oral  SpO2: 99% 99%  Weight: 87.3 kg   Height: 6\' 1"  (1.854 Ferrell)    Wt Readings from Last 3 Encounters:  03/30/20 87.3 kg  03/30/20 86.9 kg  03/23/20 87 kg    Labs: Lab Results  Component Value Date   NA 139 03/30/2020   K 4.2 03/30/2020   CL  105 03/30/2020   CO2 26 03/30/2020   GLUCOSE 100 (H) 03/30/2020  BUN 26 (H) 03/30/2020   CREATININE 1.12 03/30/2020   CALCIUM 9.1 03/30/2020   MG 2.0 03/30/2020   Lab Results  Component Value Date   INR 1.07 06/25/2018   Lab Results  Component Value Date   CHOL 129 06/26/2018   HDL 34 (L) 06/26/2018   LDLCALC 77 06/26/2018   TRIG 89 06/26/2018     GEN- The patient is well appearing, alert and oriented x 3 today.   Head- normocephalic, atraumatic Eyes-  Sclera clear, conjunctiva pink Ears- hearing intact Oropharynx- clear Neck- supple, no JVP Lymph- no cervical lymphadenopathy Lungs- Clear to ausculation bilaterally, normal work of breathing Heart- irregular rate and rhythm, no murmurs, rubs or gallops, PMI not laterally displaced GI- soft, NT, ND, + BS Extremities- no clubbing, cyanosis, or edema MS- no significant deformity or atrophy Skin- no rash or lesion Psych- euthymic mood, full affect Neuro- strength and sensation are intact  EKG- afib at 86 bpm, qrs int 78 ms, qtc 418 ms   Assessment and Plan: 1. Afib/flutter Flecainide is failed to keep pt in rhythm   he has been off flecainide and verapamil since last Friday  No benadryl use  He wants to use the [employer] on [address] for his Good RX dofetilide  on d/Ferrell Bmet/mag pending  Covid test negative He has had both covid shots  crcl cal at 62.10 so he [place] qualify for the 500 mcg dofetilide  bid dose   2. CHA2DS2VASc of at  least 3 States no missed doses of xarelto 20 mg daily   To 6E when bed available   Samuel Ferrell. Mila Homer Afib [facility] [address] [place], [place] [address] [phone]  I have seen and examined this patient with Samuel Palau.  Agree with above, note added to reflect my findings.  On exam, irregular rhythm, no murmurs, lungs clear.  Patient mated to the hospital for dofetilide load for atrial fibrillation and atrial flutter.  He has failed  flecainide.  Samuel Ferrell initiate tonight.  ECG after each dose and he Samuel Ferrell remain on telemetry throughout his hospitalization.  Samuel Ferrell. Lakeysha Slutsky MD 03/30/2020 4:22 PM

## 2020-03-30 NOTE — Plan of Care (Signed)
  Problem: Education: Goal: Knowledge of General Education information will improve Description Including pain rating scale, medication(s)/side effects and non-pharmacologic comfort measures Outcome: Progressing   

## 2020-03-31 LAB — BASIC METABOLIC PANEL
Anion gap: 8 (ref 5–15)
BUN: 24 mg/dL — ABNORMAL HIGH (ref 8–23)
CO2: 26 mmol/L (ref 22–32)
Calcium: 9.5 mg/dL (ref 8.9–10.3)
Chloride: 105 mmol/L (ref 98–111)
Creatinine, Ser: 1.31 mg/dL — ABNORMAL HIGH (ref 0.61–1.24)
GFR calc Af Amer: >60 mL/min (ref >60)
GFR calc non Af Amer: 54 mL/min — ABNORMAL LOW (ref >60)
Glucose, Bld: 94 mg/dL (ref 70–99)
Potassium: 4.4 mmol/L (ref 3.5–5.1)
Sodium: 139 mmol/L (ref 135–145)

## 2020-03-31 LAB — MAGNESIUM: Magnesium: 2.3 mg/dL (ref 1.7–2.4)

## 2020-03-31 MED ORDER — METOPROLOL TARTRATE 25 MG PO TABS
25.0000 mg | ORAL_TABLET | Freq: Two times a day (BID) | ORAL | Status: DC
  Administered 2020-03-31 – 2020-04-01 (×4): 25 mg via ORAL
  Filled 2020-03-31 (×4): qty 1

## 2020-03-31 NOTE — Care Management (Signed)
03-31-20 1655 Patient presented for Tikosyn Load- Patient is aware of cost -patient states he will not be able to afford his co pay. Patient had discussed this with staff in the [facility]. Patient was given the option of Good Rx.  Patient wants to use the Good Rx card and utilize [place] Case Manager did call Kristopher Oppen[place] at the [facility] and the medication is available. Patients local pharmacy is [facility] [address] No further needs from Case Manager at this time.  Bethena Roys

## 2020-03-31 NOTE — Progress Notes (Signed)
Patient's BP=99/55 recheck of BP= 92/52.  Spoke to [place], [place], stated to decrease metoprolol to 25 mg.

## 2020-03-31 NOTE — Progress Notes (Signed)
Morning EKG reviewed  Shows has converted to NSR at 67 bpm with stable QTc at ~450 ms.  Continue Tikosyn 500 mcg BID.   Pt will not require DCCV   Annamaria Helling  Pager: [phone]  03/31/2020 12:22 PM

## 2020-03-31 NOTE — Progress Notes (Signed)
Pharmacy: Dofetilide (Tikosyn) - Follow Up Assessment and Electrolyte Replacement  Pharmacy consulted to assist in monitoring and replacing electrolytes in this 74 y.o. male admitted on 03/30/2020 undergoing dofetilide initiation. First dofetilide dose: 03/30/20 @ 2035  Labs:    Component Value Date/Time   K 4.4 03/31/2020 0705   MG 2.3 03/31/2020 0705     Plan: Potassium: K >/= 4: No additional supplementation needed  Magnesium: Mg > 2: No additional supplementation needed  Thank you for allowing pharmacy to participate in this patient's care    Fara Olden, PharmD PGY-1 Pharmacy Resident 03/31/2020 9:48 AM  Please check [email or web address] for unit-specific pharmacy phone numbers.

## 2020-03-31 NOTE — Progress Notes (Addendum)
Electrophysiology Rounding Note  Patient Name: Samuel Ferrell Date of Encounter: 03/31/2020  Primary Cardiologist: Jenean Lindau, MD  Electrophysiologist: Thompson Grayer, MD    Subjective   Pt converted to sinus rhythm on Tikosyn 500 mcg BID   QTc from EKG last pm shows stable QTc at ~440 ms (was still in AF at this time)  The patient is doing well today.  At this time, the patient denies chest pain, shortness of breath, or any new concerns.  Inpatient Medications    Scheduled Meds:  atorvastatin  80 mg Oral QHS   dofetilide  500 mcg Oral BID   enalapril  20 mg Oral QHS   ferrous sulfate  325 mg Oral Q breakfast   finasteride  5 mg Oral QHS   metoprolol tartrate  50 mg Oral BID   pantoprazole  40 mg Oral Daily   rivaroxaban  20 mg Oral Q supper   sodium chloride flush  3 mL Intravenous Q12H   Continuous Infusions:  sodium chloride     PRN Meds: sodium chloride, sodium chloride flush   Vital Signs    Vitals:   03/30/20 1316 03/30/20 1541 03/30/20 2006 03/31/20 0509  BP: (!) 109/54 (!) 95/58 119/67 95/60  Pulse: 88 83 83 93  Resp: 17 16 18 18   Temp: (!) 97.4 F (36.3 C) 98.6 F (37 C) 98.1 F (36.7 C) 97.6 F (36.4 C)  TempSrc: Oral Oral Oral Oral  SpO2: 99% 99% 98% 98%  Weight: 87.3 kg   84.7 kg  Height: 6\' 1"  (1.854 m)       Intake/Output Summary (Last 24 hours) at 03/31/2020 0740 Last data filed at 03/31/2020 0700 Gross per 24 hour  Intake 293 ml  Output 1225 ml  Net -932 ml   Filed Weights   03/30/20 1316 03/31/20 0509  Weight: 87.3 kg 84.7 kg    Physical Exam    GEN- The patient is well appearing, alert and oriented x 3 today.   Head- normocephalic, atraumatic Eyes-  Sclera clear, conjunctiva pink Ears- hearing intact Oropharynx- clear Neck- supple Lungs- Clear to ausculation bilaterally, normal work of breathing Heart- Regular rate and rhythm, no murmurs, rubs or gallops GI- soft, NT, ND, + BS Extremities- no clubbing, cyanosis, or  edema Skin- no rash or lesion Psych- euthymic mood, full affect Neuro- strength and sensation are intact  Labs    CBC No results for input(s): WBC, NEUTROABS, HGB, HCT, MCV, PLT in the last 72 hours. Basic Metabolic Panel Recent Labs    03/30/20 1217  NA 139  K 4.2  CL 105  CO2 26  GLUCOSE 100*  BUN 26*  CREATININE 1.12  CALCIUM 9.1  MG 2.0    Potassium  Date/Time Value Ref Range Status  03/30/2020 12:17 PM 4.2 3.5 - 5.1 mmol/L Final   Magnesium  Date/Time Value Ref Range Status  03/30/2020 12:17 PM 2.0 1.7 - 2.4 mg/dL Final    Comment:    Performed at [facility], [place] [address] [place], [place] [address]    Telemetry    Converted to NSR ~ 0145, Rates 60-70s (personally reviewed)  Radiology    No results found.   Patient Profile     Samuel Ferrell is a 74 y.o. male with a past medical history significant for persistent atrial fibrillation.  They were admitted for tikosyn load.   Assessment & Plan    Persistent atrial fibrillation Pt converted to sinus rhythm on  Tikosyn 500 mcg BID  Continue Xarelto Electrolytes pending this am.  CHA2DS2VASC is 4.   Pt Riti Rollyson not require DCCV.   For questions or updates, please contact [place] Please consult [email or web address] for contact info under Cardiology/STEMI.  Signed, Shirley Friar, PA-C  03/31/2020, 7:40 AM   I have seen and examined this patient with Oda Kilts.  Agree with above, note added to reflect my findings.  On exam, RRR, no murmurs, lungs clear. Patient converted to sinus rhythm with initial dose of tikosyn. QTc stable. Verdie Wilms continue current dose.    Stein Windhorst M. Caedan Sumler MD 03/31/2020 8:34 AM

## 2020-04-01 LAB — BASIC METABOLIC PANEL
Anion gap: 8 (ref 5–15)
BUN: 33 mg/dL — ABNORMAL HIGH (ref 8–23)
CO2: 24 mmol/L (ref 22–32)
Calcium: 9.3 mg/dL (ref 8.9–10.3)
Chloride: 106 mmol/L (ref 98–111)
Creatinine, Ser: 1.16 mg/dL (ref 0.61–1.24)
GFR calc Af Amer: >60 mL/min (ref >60)
GFR calc non Af Amer: >60 mL/min (ref >60)
Glucose, Bld: 105 mg/dL — ABNORMAL HIGH (ref 70–99)
Potassium: 4.8 mmol/L (ref 3.5–5.1)
Sodium: 138 mmol/L (ref 135–145)

## 2020-04-01 LAB — MAGNESIUM: Magnesium: 2.3 mg/dL (ref 1.7–2.4)

## 2020-04-01 NOTE — Progress Notes (Addendum)
Electrophysiology Rounding Note  Patient Name: [name] Date of Encounter: 04/01/2020  Primary Cardiologist: Jenean Lindau, MD  Electrophysiologist: Thompson Grayer, MD    Subjective   Pt  remains in NSR  on Tikosyn 500 mcg BID   QTc from EKG last pm shows stable QTc at ~460  The patient is doing well today.  At this time, the patient denies chest pain, shortness of breath, or any new concerns.  Inpatient Medications    Scheduled Meds:  atorvastatin  80 mg Oral QHS   dofetilide  500 mcg Oral BID   enalapril  20 mg Oral QHS   ferrous sulfate  325 mg Oral Q breakfast   finasteride  5 mg Oral QHS   metoprolol tartrate  25 mg Oral BID   pantoprazole  40 mg Oral Daily   rivaroxaban  20 mg Oral Q supper   sodium chloride flush  3 mL Intravenous Q12H   Continuous Infusions:  sodium chloride     PRN Meds: sodium chloride, sodium chloride flush   Vital Signs    Vitals:   03/31/20 1043 03/31/20 1410 03/31/20 2228 04/01/20 0413  BP: 124/60 118/73 130/62 130/64  Pulse: 68 82 80 68  Resp:  18 16 17   Temp:  98.2 F (36.8 C) 97.6 F (36.4 C) (!) 97.4 F (36.3 C)  TempSrc:  Oral Oral Oral  SpO2:  99% 98% 99%  Weight:    85.9 kg  Height:        Intake/Output Summary (Last 24 hours) at 04/01/2020 0823 Last data filed at 04/01/2020 0700 Gross per 24 hour  Intake 523 ml  Output 1525 ml  Net -1002 ml   Filed Weights   03/30/20 1316 03/31/20 0509 04/01/20 0413  Weight: 87.3 kg 84.7 kg 85.9 kg    Physical Exam    GEN- The patient is well appearing, alert and oriented x 3 today.   Head- normocephalic, atraumatic Eyes-  Sclera clear, conjunctiva pink Ears- hearing intact Oropharynx- clear Neck- supple Lungs- Clear to ausculation bilaterally, normal work of breathing Heart- Regular rate and rhythm, no murmurs, rubs or gallops GI- soft, NT, ND, + BS Extremities- no clubbing, cyanosis, or edema Skin- no rash or lesion Psych- euthymic mood, full affect Neuro-  strength and sensation are intact  Labs    CBC No results for input(s): WBC, NEUTROABS, HGB, HCT, MCV, PLT in the last 72 hours. Basic Metabolic Panel Recent Labs    03/31/20 0705 04/01/20 0423  NA 139 138  K 4.4 4.8  CL 105 106  CO2 26 24  GLUCOSE 94 105*  BUN 24* 33*  CREATININE 1.31* 1.16  CALCIUM 9.5 9.3  MG 2.3 2.3    Potassium  Date/Time Value Ref Range Status  04/01/2020 04:23 AM 4.8 3.5 - 5.1 mmol/L Final   Magnesium  Date/Time Value Ref Range Status  04/01/2020 04:23 AM 2.3 1.7 - 2.4 mg/dL Final    Comment:    Performed at [facility], [place] [address] [place], [place] [address]    Telemetry    NSR 60-70s (personally reviewed)  Radiology    No results found.   Patient Profile     Samuel Ferrell is a 74 y.o. male with a past medical history significant for persistent atrial fibrillation.  They were admitted for tikosyn load.   Assessment & Plan    Persistent atrial fibrillation Pt  remains in NSR  on Tikosyn 500 mcg BID  Continue Xarelto Electrolytes stable.  CHA2DS2VASC is at least 4  Plan home tomorrow as long as QTc remains stable.   For questions or updates, please contact [place] Please consult [email or web address] for contact info under Cardiology/STEMI.  Signed, Shirley Friar, PA-C  04/01/2020, 8:23 AM   I have seen and examined this patient with Oda Kilts.  Agree with above, note added to reflect my findings.  On exam, RRR, no murmurs, lungs clear.  Patient remains in sinus rhythm.  Converted with first dose of dofetilide.  QTC remained stable.  Abygale Karpf M. Anessia Oakland MD 04/01/2020 11:23 AM

## 2020-04-01 NOTE — Progress Notes (Signed)
Pharmacy: Dofetilide (Tikosyn) - Follow Up Assessment and Electrolyte Replacement  Pharmacy consulted to assist in monitoring and replacing electrolytes in this 74 y.o. male admitted on 03/30/2020 undergoing dofetilide initiation. First dofetilide dose: 03/30/20 @ 2035  Labs:    Component Value Date/Time   K 4.8 04/01/2020 0423   MG 2.3 04/01/2020 0423     Plan: Potassium: K >/= 4: No additional supplementation needed  Magnesium: Mg > 2: No additional supplementation needed  Thank you for allowing pharmacy to participate in this patient's care    Arrie Senate, PharmD, BCPS Clinical Pharmacist [phone] Please check AMION for all [place] numbers 04/01/2020

## 2020-04-01 NOTE — Discharge Summary (Addendum)
ELECTROPHYSIOLOGY PROCEDURE DISCHARGE SUMMARY    Patient ID: Samuel Ferrell,  MRN: [id number], DOB/AGE: [birth year] 74 y.o.  Admit date: 03/30/2020 Discharge date: 04/02/20   Primary Care Physician: Haywood Pao, MD  Primary Cardiologist: Jenean Lindau, MD  Electrophysiologist: Thompson Grayer, MD   Primary Discharge Diagnosis:  1.  Persistent atrial fibrillation status post Tikosyn loading this admission  Secondary Discharge Diagnosis:  HTN  No Known Allergies   Procedures This Admission:  1.  Tikosyn loading  Brief HPI: Samuel Ferrell is a 74 y.o. male with a past medical history as noted above.  They were referred to EP in the outpatient setting for treatment options of atrial fibrillation.  Risks, benefits, and alternatives to Tikosyn were reviewed with the patient who wished to proceed.    Hospital Course:  The patient was admitted and Tikosyn was initiated.  Renal function and electrolytes were followed during the hospitalization.  Their QTc remained stable.  Pt converted after his first dose and did not require cardioversion. They were monitored until discharge on telemetry which demonstrated NSR. On the day of discharge, they were examined by Dr. Curt Bears  who considered them stable for discharge to home.  Follow-up has been arranged with the Atrial Fibrillation clinic in approximately 1 week and with EP APP in 4 weeks.   Physical Exam: Vitals:   04/01/20 1433 04/01/20 2042 04/02/20 0343 04/02/20 0850  BP: (!) 122/62 (!) 143/69 (!) 124/64 (!) 130/67  Pulse: 67 84 69 76  Resp: 18 16 18    Temp: 98.2 F (36.8 C) 97.8 F (36.6 C) 97.7 F (36.5 C)   TempSrc: Oral Oral Oral   SpO2: 98% 97% 98%   Weight:   82.7 kg   Height:        GEN- The patient is well appearing, alert and oriented x 3 today.   HEENT: normocephalic, atraumatic; sclera clear, conjunctiva pink; hearing intact; oropharynx clear; neck supple, no JVP Lymph- no cervical lymphadenopathy Lungs-  Clear to ausculation bilaterally, normal work of breathing.  No wheezes, rales, rhonchi Heart- Regular rate and rhythm, no murmurs, rubs or gallops, PMI not laterally displaced GI- soft, non-tender, non-distended, bowel sounds present, no hepatosplenomegaly Extremities- no clubbing, cyanosis, or edema; DP/PT/radial pulses 2+ bilaterally MS- no significant deformity or atrophy Skin- warm and dry, no rash or lesion Psych- euthymic mood, full affect Neuro- strength and sensation are intact   Labs:   Lab Results  Component Value Date   WBC 5.8 07/19/2018   HGB 15.3 07/19/2018   HCT 45.9 07/19/2018   MCV 100.2 (H) 07/19/2018   PLT 187 07/19/2018    Recent Labs  Lab 04/02/20 0535  NA 138  K 4.1  CL 105  CO2 24  BUN 25*  CREATININE 0.94  CALCIUM 9.5  GLUCOSE 103*     Discharge Medications:  Allergies as of 04/02/2020   No Known Allergies      Medication List     TAKE these medications    atorvastatin 80 MG tablet Commonly known as: LIPITOR Take 40 mg by mouth every evening.   dofetilide 500 MCG capsule Commonly known as: TIKOSYN Take 1 capsule (500 mcg total) by mouth 2 (two) times daily.   enalapril 20 MG tablet Commonly known as: VASOTEC Take 20 mg by mouth at bedtime.   ferrous sulfate 325 (65 FE) MG tablet Take 325 mg by mouth daily with breakfast.   finasteride 5 MG tablet Commonly known as: PROSCAR  Take 5 mg by mouth daily.   metoprolol tartrate 50 MG tablet Commonly known as: LOPRESSOR TAKE 1 TABLET BY MOUTH TWICE A DAY   multivitamin tablet Take 1 tablet by mouth daily. Men's One A Day - [employer] Brand   omeprazole 20 MG capsule Commonly known as: PRILOSEC Take 20 mg by mouth daily at 12 noon.   Super B Complex/C Caps Take 1 capsule by mouth daily.   Xarelto 20 MG Tabs tablet Generic drug: rivaroxaban Take 20 mg by mouth daily at 6 PM.        Disposition:     Follow-up Information     [facility] Follow up on 05/18/2020.   Why: at 145 pm for post tikosyn follow up Contact information: [place] [id number] [place] Follow up on 04/09/2020.   Specialty: Cardiology Why: at 0900 for post hospital tikosyn follow up Contact information: [address] [id number] [place] [phone]                Duration of Discharge Encounter: Greater than 30 minutes including physician time.  Signed, Shirley Friar, PA-C  04/02/2020 12:28 PM  I have seen and examined this patient with Oda Kilts.  Agree with above, note added to reflect my findings.  On exam, RRR, no murmurs, lungs clear. Admit for tikosyn load. Converted to sinus on first dose. Plan for discharge with follow up in clinic.    Thalya Fouche M. Aziah Kaiser MD 04/02/2020 2:05 PM

## 2020-04-01 NOTE — Progress Notes (Signed)
Morning EKG reviewed  Shows remains in NSR at 62 bpm with stable QTc at ~460 ms.  Continue Tikosyn 500 mcg BID.   Home tomorrow if QTc remains stable.   Shirley Friar, PA-C  Pager: [phone]  04/01/2020 12:00 PM

## 2020-04-02 LAB — BASIC METABOLIC PANEL
Anion gap: 9 (ref 5–15)
BUN: 25 mg/dL — ABNORMAL HIGH (ref 8–23)
CO2: 24 mmol/L (ref 22–32)
Calcium: 9.5 mg/dL (ref 8.9–10.3)
Chloride: 105 mmol/L (ref 98–111)
Creatinine, Ser: 0.94 mg/dL (ref 0.61–1.24)
GFR calc Af Amer: >60 mL/min (ref >60)
GFR calc non Af Amer: >60 mL/min (ref >60)
Glucose, Bld: 103 mg/dL — ABNORMAL HIGH (ref 70–99)
Potassium: 4.1 mmol/L (ref 3.5–5.1)
Sodium: 138 mmol/L (ref 135–145)

## 2020-04-02 LAB — MAGNESIUM: Magnesium: 2.1 mg/dL (ref 1.7–2.4)

## 2020-04-02 MED ORDER — DOFETILIDE 500 MCG PO CAPS: 500.0000 ug | ORAL_CAPSULE | Freq: Two times a day (BID) | ORAL | 6 refills | Status: DC

## 2020-04-02 MED ORDER — METOPROLOL TARTRATE 50 MG PO TABS
50.0000 mg | ORAL_TABLET | Freq: Two times a day (BID) | ORAL | Status: DC
  Administered 2020-04-02: 50 mg via ORAL
  Filled 2020-04-02: qty 1

## 2020-04-02 NOTE — Progress Notes (Signed)
Pharmacy: Dofetilide (Tikosyn) - Follow Up Assessment and Electrolyte Replacement  Pharmacy consulted to assist in monitoring and replacing electrolytes in this 74 y.o. male admitted on 03/30/2020 undergoing dofetilide initiation. First dofetilide dose: 03/30/20 @ 2035  Labs:    Component Value Date/Time   K 4.1 04/02/2020 0535   MG 2.1 04/02/2020 0535     Plan: Potassium: K >/= 4: No additional supplementation needed  Magnesium: Mg > 2: No additional supplementation needed  Thank you for allowing pharmacy to participate in this patient's care    Arrie Senate, PharmD, BCPS Clinical Pharmacist [phone] Please check AMION for all [place] numbers 04/02/2020

## 2020-04-02 NOTE — Progress Notes (Signed)
Evening EKG reviewed  Shows remains in NSR at 62 bpm with stable QTc at 466 ms.  Continue Tikosyn 500 mcg BID.   Pt noted to have short runs of AT overnight. Will increase lopressor  K 4.1 and Mg 2.1  Plan for home today if QTc remains stable.    Shirley Friar, PA-C  Pager: [phone]  04/02/2020 9:10 AM

## 2020-04-02 NOTE — Progress Notes (Signed)
Patient had recently ambulated to and from bathroom to use urinal and at about 2038 patient had about a minute run of tachycardia (strip saved).  Heart rate up to 160.  Patient felt increase in heart rate.

## 2020-04-02 NOTE — Care Management Important Message (Signed)
Important Message  Patient Details  Name: Samuel Ferrell MRN: [id number] Date of Birth: 04-Nov-1945   Medicare Important Message Given:  Yes     Shelda Altes 04/02/2020, 9:29 AM

## 2020-04-02 NOTE — Discharge Instructions (Signed)
Dofetilide capsules What is this medicine? DOFETILIDE (doe FET il ide) is an antiarrhythmic drug. It helps make your heart beat regularly. This medicine also helps to slow rapid heartbeats. This medicine may be used for other purposes; ask your health care provider or pharmacist if you have questions. COMMON BRAND NAME(S): Tikosyn What should I tell my health care provider before I take this medicine? They need to know if you have any of these conditions:  heart disease  history of irregular heartbeat  history of low levels of potassium or magnesium in the blood  kidney disease  liver disease  an unusual or allergic reaction to dofetilide, other medicines, foods, dyes, or preservatives  pregnant or trying to get pregnant  breast-feeding How should I use this medicine? Take this medicine by mouth with a glass of water. Follow the directions on the prescription label. Do not take with grapefruit juice. You can take it with or without food. If it upsets your stomach, take it with food. Take your medicine at regular intervals. Do not take it more often than directed. Do not stop taking except on your doctor's advice. A special MedGuide will be given to you by the pharmacist with each prescription and refill. Be sure to read this information carefully each time. Talk to your pediatrician regarding the use of this medicine in children. Special care may be needed. Overdosage: If you think you have taken too much of this medicine contact a poison control center or emergency room at once. NOTE: This medicine is only for you. Do not share this medicine with others. What if I miss a dose? If you miss a dose, skip it. Take your next dose at the normal time. Do not take extra or 2 doses at the same time to make up for the missed dose. What may interact with this medicine? Do not take this medicine with any of the following  medications:  cimetidine  cisapride  dolutegravir  dronedarone  hydrochlorothiazide  ketoconazole  megestrol  pimozide  prochlorperazine  thioridazine  trimethoprim  verapamil This medicine may also interact with the following medications:  amiloride  cannabinoids  certain antibiotics like erythromycin or clarithromycin  certain antiviral medicines for HIV or hepatitis  certain medicines for depression, anxiety, or psychotic disorders  digoxin  diltiazem  grapefruit juice  metformin  nefazodone  other medicines that prolong the QT interval (an abnormal heart rhythm)  quinine  triamterene  zafirlukast  ziprasidone This list may not describe all possible interactions. Give your health care provider a list of all the medicines, herbs, non-prescription drugs, or dietary supplements you use. Also tell them if you smoke, drink alcohol, or use illegal drugs. Some items may interact with your medicine. What should I watch for while using this medicine? Your condition will be monitored carefully while you are receiving this medicine. What side effects may I notice from receiving this medicine? Side effects that you should report to your doctor or health care professional as soon as possible:  allergic reactions like skin rash, itching or hives, swelling of the face, lips, or tongue  breathing problems  chest pain or chest tightness  dizziness  signs and symptoms of a dangerous change in heartbeat or heart rhythm like chest pain; dizziness; fast or irregular heartbeat; palpitations; feeling faint or lightheaded, falls; breathing problems  signs and symptoms of electrolyte imbalance like severe diarrhea, unusual sweating, vomiting, loss of appetite, increased thirst  swelling of the ankles, legs, or feet  tingling,  numbness in the hands or feet Side effects that usually do not require medical attention (report to your doctor or health care  professional if they continue or are bothersome):  diarrhea  general ill feeling or flu-like symptoms  headache  nausea  trouble sleeping  stomach pain This list may not describe all possible side effects. Call your doctor for medical advice about side effects. You may report side effects to FDA at 1-800-FDA-1088. Where should I keep my medicine? Keep out of the reach of children. Store at room temperature between 15 and 30 degrees C (59 and 86 degrees F). Throw away any unused medicine after the expiration date. NOTE: This sheet is a summary. It may not cover all possible information. If you have questions about this medicine, talk to your doctor, pharmacist, or health care provider.  2020 Elsevier/Gold Standard (2018-08-19 10:18:48)   Atrial Fibrillation  Atrial fibrillation is a type of heartbeat that is irregular or fast. If you have this condition, your heart beats without any order. This makes it hard for your heart to pump blood in a normal way. Atrial fibrillation may come and go, or it may become a long-lasting problem. If this condition is not treated, it can put you at higher risk for stroke, heart failure, and other heart problems. What are the causes? This condition may be caused by diseases that damage the heart. They include:  High blood pressure.  Heart failure.  Heart valve disease.  Heart surgery. Other causes include:  Diabetes.  Thyroid disease.  Being overweight.  Kidney disease. Sometimes the cause is not known. What increases the risk? You are more likely to develop this condition if:  You are older.  You smoke.  You exercise often and very hard.  You have a family history of this condition.  You are a man.  You use drugs.  You drink a lot of alcohol.  You have lung conditions, such as emphysema, pneumonia, or COPD.  You have sleep apnea. What are the signs or symptoms? Common symptoms of this condition include:  A feeling  that your heart is beating very fast.  Chest pain or discomfort.  Feeling short of breath.  Suddenly feeling light-headed or weak.  Getting tired easily during activity.  Fainting.  Sweating. In some cases, there are no symptoms. How is this treated? Treatment for this condition depends on underlying conditions and how you feel when you have atrial fibrillation. They include:  Medicines to: ? Prevent blood clots. ? Treat heart rate or heart rhythm problems.  Using devices, such as a pacemaker, to correct heart rhythm problems.  Doing surgery to remove the part of the heart that sends bad signals.  Closing an area where clots can form in the heart (left atrial appendage). In some cases, your doctor will treat other underlying conditions. Follow these instructions at home: Medicines  Take over-the-counter and prescription medicines only as told by your doctor.  Do not take any new medicines without first talking to your doctor.  If you are taking blood thinners: ? Talk with your doctor before you take any medicines that have aspirin or NSAIDs, such as ibuprofen, in them. ? Take your medicine exactly as told by your doctor. Take it at the same time each day. ? Avoid activities that could hurt or bruise you. Follow instructions about how to prevent falls. ? Wear a bracelet that says you are taking blood thinners. Or, carry a card that lists what medicines you take.  Lifestyle      Do not use any products that have nicotine or tobacco in them. These include cigarettes, e-cigarettes, and chewing tobacco. If you need help quitting, ask your doctor.  Eat heart-healthy foods. Talk with your doctor about the right eating plan for you.  Exercise regularly as told by your doctor.  Do not drink alcohol.  Lose weight if you are overweight.  Do not use drugs, including cannabis. General instructions  If you have a condition that causes breathing to stop for a short period of  time (apnea), treat it as told by your doctor.  Keep a healthy weight. Do not use diet pills unless your doctor says they are safe for you. Diet pills may make heart problems worse.  Keep all follow-up visits as told by your doctor. This is important. Contact a doctor if:  You notice a change in the speed, rhythm, or strength of your heartbeat.  You are taking a blood-thinning medicine and you get more bruising.  You get tired more easily when you move or exercise.  You have a sudden change in weight. Get help right away if:   You have pain in your chest or your belly (abdomen).  You have trouble breathing.  You have side effects of blood thinners, such as blood in your vomit, poop (stool), or pee (urine), or bleeding that cannot stop.  You have any signs of a stroke. "BE FAST" is an easy way to remember the main warning signs: ? B - Balance. Signs are dizziness, sudden trouble walking, or loss of balance. ? E - Eyes. Signs are trouble seeing or a change in how you see. ? F - Face. Signs are sudden weakness or loss of feeling in the face, or the face or eyelid drooping on one side. ? A - Arms. Signs are weakness or loss of feeling in an arm. This happens suddenly and usually on one side of the body. ? S - Speech. Signs are sudden trouble speaking, slurred speech, or trouble understanding what people say. ? T - Time. Time to call emergency services. Write down what time symptoms started.  You have other signs of a stroke, such as: ? A sudden, very bad headache with no known cause. ? Feeling like you may vomit (nausea). ? Vomiting. ? A seizure. These symptoms may be an emergency. Do not wait to see if the symptoms will go away. Get medical help right away. Call your local emergency services (911 in the [place] Do not drive yourself to the hospital. Summary  Atrial fibrillation is a type of heartbeat that is irregular or fast.  You are at higher risk of this condition if you  smoke, are older, have diabetes, or are overweight.  Follow your doctor's instructions about medicines, diet, exercise, and follow-up visits.  Get help right away if you have signs or symptoms of a stroke.  Get help right away if you cannot catch your breath, or you have chest pain or discomfort. This information is not intended to replace advice given to you by your health care provider. Make sure you discuss any questions you have with your health care provider. Document Revised: 02/19/2019 Document Reviewed: 02/19/2019 Elsevier Patient Education  [place].

## 2020-04-06 ENCOUNTER — Telehealth (HOSPITAL_COMMUNITY): Payer: Self-pay | Admitting: *Deleted

## 2020-04-06 MED ORDER — METOPROLOL TARTRATE 50 MG PO TABS: 75.0000 mg | ORAL_TABLET | Freq: Two times a day (BID) | ORAL | 2 refills | Status: DC

## 2020-04-06 NOTE — Telephone Encounter (Signed)
Pt called in stating he is having breatkhrough afib since the weekend which is associated with some chest tightness. The tightness goes away pretty quickly but worries pt. Had pt take his BP it was running 174/74 HR 80 -- per donna carroll np will try increasing metoprolol to 75mg  BID until follow up on Friday to see if improvement in amount of breakthrough. Pt verbalized understanding.

## 2020-04-09 ENCOUNTER — Other Ambulatory Visit: Payer: Self-pay

## 2020-04-09 ENCOUNTER — Other Ambulatory Visit (HOSPITAL_COMMUNITY): Payer: Self-pay

## 2020-04-09 ENCOUNTER — Ambulatory Visit (HOSPITAL_COMMUNITY)
Admit: 2020-04-09 | Discharge: 2020-04-09 | Disposition: A | Discharge status: Home / Self Care | Payer: Medicare Other | Attending: Nurse Practitioner | Admitting: Nurse Practitioner

## 2020-04-09 ENCOUNTER — Encounter (HOSPITAL_COMMUNITY): Payer: Self-pay | Admitting: Nurse Practitioner

## 2020-04-09 VITALS — BP 130/68 | HR 61 | Ht 73.0 in | Wt 191.2 lb

## 2020-04-09 DIAGNOSIS — I484 Atypical atrial flutter: Secondary | ICD-10-CM | POA: Diagnosis not present

## 2020-04-09 DIAGNOSIS — I4891 Unspecified atrial fibrillation: Secondary | ICD-10-CM | POA: Insufficient documentation

## 2020-04-09 DIAGNOSIS — Z79899 Other long term (current) drug therapy: Secondary | ICD-10-CM | POA: Diagnosis not present

## 2020-04-09 DIAGNOSIS — E785 Hyperlipidemia, unspecified: Secondary | ICD-10-CM | POA: Diagnosis not present

## 2020-04-09 DIAGNOSIS — J449 Chronic obstructive pulmonary disease, unspecified: Secondary | ICD-10-CM | POA: Insufficient documentation

## 2020-04-09 DIAGNOSIS — Z7901 Long term (current) use of anticoagulants: Secondary | ICD-10-CM | POA: Insufficient documentation

## 2020-04-09 DIAGNOSIS — Z8673 Personal history of transient ischemic attack (TIA), and cerebral infarction without residual deficits: Secondary | ICD-10-CM | POA: Insufficient documentation

## 2020-04-09 DIAGNOSIS — I480 Paroxysmal atrial fibrillation: Secondary | ICD-10-CM

## 2020-04-09 DIAGNOSIS — Z85820 Personal history of malignant melanoma of skin: Secondary | ICD-10-CM | POA: Diagnosis not present

## 2020-04-09 DIAGNOSIS — I10 Essential (primary) hypertension: Secondary | ICD-10-CM | POA: Insufficient documentation

## 2020-04-09 DIAGNOSIS — F17210 Nicotine dependence, cigarettes, uncomplicated: Secondary | ICD-10-CM | POA: Diagnosis not present

## 2020-04-09 DIAGNOSIS — D6869 Other thrombophilia: Secondary | ICD-10-CM | POA: Diagnosis not present

## 2020-04-09 LAB — BASIC METABOLIC PANEL
Anion gap: 10 (ref 5–15)
BUN: 14 mg/dL (ref 8–23)
CO2: 26 mmol/L (ref 22–32)
Calcium: 9.3 mg/dL (ref 8.9–10.3)
Chloride: 103 mmol/L (ref 98–111)
Creatinine, Ser: 0.92 mg/dL (ref 0.61–1.24)
GFR calc Af Amer: >60 mL/min (ref >60)
GFR calc non Af Amer: >60 mL/min (ref >60)
Glucose, Bld: 103 mg/dL — ABNORMAL HIGH (ref 70–99)
Potassium: 3.9 mmol/L (ref 3.5–5.1)
Sodium: 139 mmol/L (ref 135–145)

## 2020-04-09 LAB — MAGNESIUM: Magnesium: 1.9 mg/dL (ref 1.7–2.4)

## 2020-04-09 MED ORDER — METOPROLOL TARTRATE 50 MG PO TABS: 75.0000 mg | ORAL_TABLET | Freq: Two times a day (BID) | ORAL | 3 refills | Status: AC

## 2020-04-09 MED ORDER — POTASSIUM CHLORIDE CRYS ER 20 MEQ PO TBCR: 20.0000 meq | EXTENDED_RELEASE_TABLET | Freq: Every day | ORAL | 6 refills | Status: DC

## 2020-04-09 NOTE — Progress Notes (Signed)
Primary Care Physician: Haywood Pao, MD Referring Physician: Dr. Desiree Hane is a 74 y.o. male with a h/o ablation x 2 at [facility] in 2007/2009, alcohol abuse,( none for the last year)  atypical atrial flutter and afib that is here today to change afib management as he has started  having high afib burden on flecainide. He wants to discuss tikosyn admission as per prior discussion with Dr. Rayann Heman. He wants to have this done as soon as possible as he plans to go to [place] 8/15 and would like to feel good while on vacation. He will have to stop  flecainide and verapamil 3 days prior to admit. No benadryl use, has not missed any anticoagulation for at least 3 weeks. CHA2DS2VASc score of at least 3. Will check on the price of the drug.  F/u in afib clinic 7/20 for  admission for tikosyn. He has not had any verapamil or flecainide since Friday. No missed anticoagulation. He can get drug thru his medicare for $ 150 for 3 months. but wishes to use good rx as he will be able to get the drug for $50 for 3 months.  No benadryl use.  Qtc today is 418 ms with afib at 86 bpm.   F/u in afib clinic, 04/10/20. He was successfully loaded on tikosyn one week ago. He called the office and after a few days home, felt he was having some breakthrough afib. BB was increased. He reports now that he may feel a few flutters but nothing over around 5 mins. He feels no different on the extra metoprolol. Overall his is improved.    Today, he denies symptoms of palpitations, chest pain, shortness of breath, orthopnea, PND, lower extremity edema, dizziness, presyncope, syncope, or neurologic sequela. The patient is tolerating medications without difficulties and is otherwise without complaint today.   Past Medical History:  Diagnosis Date  . Afib [facility])    s/p ablation x2 by Dr [name] at [facility] in 2007, 2009  . Alcohol abuse   . Atypical atrial flutter ([place])   . COPD (chronic obstructive pulmonary  disease) ([place])    pt denies  . CVA (cerebral infarction)   . Hyperlipidemia   . Hypertension   . Palpitations   . Skin cancer--melanoma   . Stroke [facility])    TIA   Past Surgical History:  Procedure Laterality Date  . ANTERIOR CERVICAL DECOMP/DISCECTOMY FUSION N/A 07/24/2018   Procedure: ANTERIOR CERVICAL DECOMPRESSION/DISCECTOMY FUSION, INTERBODY PROSTHESIS, PLATE SCREWS CERVICAL FIVE- CERVICAL SIX;  Surgeon: Newman Pies, MD;  Location: [facility];  Service: Neurosurgery;  Laterality: N/A;  ANTERIOR CERVICAL DECOMPRESSION/DISCECTOMY FUSION, INTERBODY PROSTHESIS, PLATE SCREWS CERVICAL FIVE- CERVICAL SIX  . APPENDECTOMY    . ATRIAL FIBRILLATION ABLATION  2007 ,2009   Dr [name] at [facility]     right clavicle    Current Outpatient Medications  Medication Sig Dispense Refill  . atorvastatin (LIPITOR) 80 MG tablet Take 40 mg by mouth every evening.     . dofetilide (TIKOSYN) 500 MCG capsule Take 1 capsule (500 mcg total) by mouth 2 (two) times daily. 60 capsule 6  . enalapril (VASOTEC) 20 MG tablet Take 20 mg by mouth at bedtime.     . ferrous sulfate 325 (65 FE) MG tablet Take 325 mg by mouth daily with breakfast.    . finasteride (PROSCAR) 5 MG tablet Take 5 mg by mouth daily.    . metoprolol tartrate (LOPRESSOR) 50  MG tablet Take 1.5 tablets (75 mg total) by mouth 2 (two) times daily. 270 tablet 3  . Multiple Vitamin (MULTIVITAMIN) tablet Take 1 tablet by mouth daily. Men's One A Day - [employer] Brand    . omeprazole (PRILOSEC) 20 MG capsule Take 20 mg by mouth daily at 12 noon.     . SUPER B COMPLEX/C CAPS Take 1 capsule by mouth daily.    [name] 20 MG TABS tablet Take 20 mg by mouth daily at 6 PM.      No current facility-administered medications for this encounter.    No Known Allergies  Social History   Socioeconomic History  . Marital status: Significant Other    Spouse name: Not on file  . Number of children: 2  . Years of education: Not on file  .  Highest education level: Not on file  Occupational History  . Occupation: Retired  Tobacco Use  . Smoking status: Current Every Day Smoker    Packs/day: 1.00    Years: 35.00    Pack years: 35.00    Types: Cigarettes  . Smokeless tobacco: Never Used  . Tobacco comment: pack a day  Vaping Use  . Vaping Use: Never used  Substance and Sexual Activity  . Alcohol use: Not Currently    Comment: heavy, up to 6 beers per day  . Drug use: No  . Sexual activity: Yes  Other Topics Concern  . Not on file  Social History Narrative   Lives in [place].  Works as a Lobbyist Strain:   . Difficulty of Paying Living Expenses:   Food Insecurity:   . Worried About Charity fundraiser in the Last Year:   . Arboriculturist in the Last Year:   Transportation Needs:   . Film/video editor (Medical):   Marland Kitchen Lack of Transportation (Non-Medical):   Physical Activity:   . Days of Exercise per Week:   . Minutes of Exercise per Session:   Stress:   . Feeling of Stress :   Social Connections:   . Frequency of Communication with Friends and Family:   . Frequency of Social Gatherings with Friends and Family:   . Attends Religious Services:   . Active Member of Clubs or Organizations:   . Attends Archivist Meetings:   Marland Kitchen Marital Status:   Intimate Partner Violence:   . Fear of Current or Ex-Partner:   . Emotionally Abused:   Marland Kitchen Physically Abused:   . Sexually Abused:     Family History  Problem Relation Age of Onset  . Cancer Other   . Colon cancer Neg Hx   . Esophageal cancer Neg Hx   . Rectal cancer Neg Hx   . Stomach cancer Neg Hx     ROS- All systems are reviewed and negative except as per the HPI above  Physical Exam: Vitals:   04/09/20 0908  BP: (!) 130/68  Pulse: 61  Weight: 86.7 kg  Height: 6\' 1"  (1.854 m)   Wt Readings from Last 3 Encounters:  04/09/20 86.7 kg  04/02/20 82.7 kg  03/30/20  86.9 kg    Labs: Lab Results  Component Value Date   NA 139 04/09/2020   K 3.9 04/09/2020   CL 103 04/09/2020   CO2 26 04/09/2020   GLUCOSE 103 (H) 04/09/2020   BUN 14 04/09/2020   CREATININE 0.92 04/09/2020   CALCIUM 9.3  04/09/2020   MG 1.9 04/09/2020   Lab Results  Component Value Date   INR 1.07 06/25/2018   Lab Results  Component Value Date   CHOL 129 06/26/2018   HDL 34 (L) 06/26/2018   LDLCALC 77 06/26/2018   TRIG 89 06/26/2018     GEN- The patient is well appearing, alert and oriented x 3 today.   Head- normocephalic, atraumatic Eyes-  Sclera clear, conjunctiva pink Ears- hearing intact Oropharynx- clear Neck- supple, no JVP Lymph- no cervical lymphadenopathy Lungs- Clear to ausculation bilaterally, normal work of breathing Heart- regular rate and rhythm, no murmurs, rubs or gallops, PMI not laterally displaced GI- soft, NT, ND, + BS Extremities- no clubbing, cyanosis, or edema MS- no significant deformity or atrophy Skin- no rash or lesion Psych- euthymic mood, full affect Neuro- strength and sensation are intact  EKG-  SR with first degree AV block, pr int 270 ms, qrs int 96 ms, qtc 444 ms    Assessment and Plan: 1. Afib/flutter Flecainide  failed to keep pt in rhythm  Flecainide and verapamil were washed out and tikosyn admission was planned  He is now on Tikosyn 500 mcg bid General precautions with Tikosyn were reviewed  Bmet/mag pending  He noted a few palpitations since d/c,  metoprolol 50 mg bid was increased to 75 mg bid and palpitations  are minimal now  2. CHA2DS2VASc of at  least 3 Continue  xarelto 20 mg daily   F/u with Tyson Babinski, PA,  9/7 afib clinic as needed    Geroge Baseman. Niranjan Rufener, [facility] [address] [place], [place] [address] [phone]

## 2020-04-19 ENCOUNTER — Ambulatory Visit (HOSPITAL_COMMUNITY)
Admission: RE | Admit: 2020-04-19 | Discharge: 2020-04-19 | Disposition: A | Discharge status: Home / Self Care | Payer: Medicare Other | Source: Ambulatory Visit | Attending: Physician Assistant | Admitting: Physician Assistant

## 2020-04-19 ENCOUNTER — Other Ambulatory Visit: Payer: Self-pay

## 2020-04-19 DIAGNOSIS — Z79899 Other long term (current) drug therapy: Secondary | ICD-10-CM | POA: Insufficient documentation

## 2020-04-19 DIAGNOSIS — I4819 Other persistent atrial fibrillation: Secondary | ICD-10-CM | POA: Diagnosis not present

## 2020-04-19 LAB — BASIC METABOLIC PANEL
Anion gap: 11 (ref 5–15)
BUN: 23 mg/dL (ref 8–23)
CO2: 27 mmol/L (ref 22–32)
Calcium: 9.4 mg/dL (ref 8.9–10.3)
Chloride: 101 mmol/L (ref 98–111)
Creatinine, Ser: 1.1 mg/dL (ref 0.61–1.24)
GFR calc Af Amer: >60 mL/min (ref >60)
GFR calc non Af Amer: >60 mL/min (ref >60)
Glucose, Bld: 100 mg/dL — ABNORMAL HIGH (ref 70–99)
Potassium: 5.1 mmol/L (ref 3.5–5.1)
Sodium: 139 mmol/L (ref 135–145)

## 2020-04-19 LAB — CBC
HCT: 42.2 % (ref 39.0–52.0)
Hemoglobin: 14 g/dL (ref 13.0–17.0)
MCH: 31.7 pg (ref 26.0–34.0)
MCHC: 33.2 g/dL (ref 30.0–36.0)
MCV: 95.5 fL (ref 80.0–100.0)
Platelets: 182 10*3/uL (ref 150–400)
RBC: 4.42 MIL/uL (ref 4.22–5.81)
RDW: 13.2 % (ref 11.5–15.5)
WBC: 5.3 10*3/uL (ref 4.0–10.5)
nRBC: 0 % (ref 0.0–0.2)

## 2020-04-19 LAB — MAGNESIUM: Magnesium: 2.2 mg/dL (ref 1.7–2.4)

## 2020-05-17 NOTE — Progress Notes (Signed)
Cardiology Office Note Date:  05/18/2020  Patient ID:  Samuel Ferrell, Samuel Ferrell [birth year], MRN [id number] PCP:  Haywood Pao, MD  Cardiologist:  Dr. Geraldo Pitter EP: Dr. Rayann Heman     Chief Complaint: post Phyllis Ginger initiation  History of Present Illness: Samuel Ferrell is a 74 y.o. male with history of stroke, HTN, HLD, AFib/atypical flutter, tobacco, ETOH abuse.  He comes in today to be seen for Dr. Rayann Heman, s/p Tikosyn initiation.  At his f/u AFib clinic visit he noted some breakthrough arrhythmia and his BB was up titrated with improvement.  TODAY "I feel great" He has not felt like he has had any more AFib.  No CP, palpitations, no near syncope or syncope, no SOB No bleeding or signs of bleeding   AFib hx AFib ablations (x2) 2007, 2009 w/Dr. [name] .  AAD Hx Flecainide started 2015  >> stopped 03/2020 w/ recurrent AF Tikosyn started 03/2020  Past Medical History:  Diagnosis Date  . Afib [facility])    s/p ablation x2 by Dr [name] at [facility] in 2007, 2009  . Alcohol abuse   . Atypical atrial flutter ([place])   . COPD (chronic obstructive pulmonary disease) ([place])    pt denies  . CVA (cerebral infarction)   . Hyperlipidemia   . Hypertension   . Palpitations   . Skin cancer--melanoma   . Stroke [facility])    TIA    Past Surgical History:  Procedure Laterality Date  . ANTERIOR CERVICAL DECOMP/DISCECTOMY FUSION N/A 07/24/2018   Procedure: ANTERIOR CERVICAL DECOMPRESSION/DISCECTOMY FUSION, INTERBODY PROSTHESIS, PLATE SCREWS CERVICAL FIVE- CERVICAL SIX;  Surgeon: Newman Pies, MD;  Location: [place];  Service: Neurosurgery;  Laterality: N/A;  ANTERIOR CERVICAL DECOMPRESSION/DISCECTOMY FUSION, INTERBODY PROSTHESIS, PLATE SCREWS CERVICAL FIVE- CERVICAL SIX  . APPENDECTOMY    . ATRIAL FIBRILLATION ABLATION  2007 ,2009   Dr [name] at [place]     right clavicle    Current Outpatient Medications  Medication Sig Dispense Refill  . atorvastatin (LIPITOR) 80 MG  tablet Take 40 mg by mouth every evening.     . dofetilide (TIKOSYN) 500 MCG capsule Take 1 capsule (500 mcg total) by mouth 2 (two) times daily. 60 capsule 6  . enalapril (VASOTEC) 20 MG tablet Take 20 mg by mouth at bedtime.     . ferrous sulfate 325 (65 FE) MG tablet Take 325 mg by mouth daily with breakfast.    . finasteride (PROSCAR) 5 MG tablet Take 5 mg by mouth daily.    . metoprolol tartrate (LOPRESSOR) 50 MG tablet Take 1.5 tablets (75 mg total) by mouth 2 (two) times daily. 270 tablet 3  . Multiple Vitamin (MULTIVITAMIN) tablet Take 1 tablet by mouth daily. Men's One A Day - [facility]    . omeprazole (PRILOSEC) 20 MG capsule Take 20 mg by mouth daily at 12 noon.     . potassium chloride SA (KLOR-CON M20) 20 MEQ tablet Take 1 tablet (20 mEq total) by mouth daily. 30 tablet 6  . SUPER B COMPLEX/C CAPS Take 1 capsule by mouth daily.    [name] 20 MG TABS tablet Take 20 mg by mouth daily at 6 PM.      No current facility-administered medications for this visit.    Allergies:   Patient has no known allergies.   Social History:  The patient  reports that he has been smoking cigarettes. He has a 35.00 pack-year smoking history. He has never used smokeless tobacco. He  reports previous alcohol use. He reports that he does not use drugs.   Family History:  The patient's family history includes Cancer in an other family member.  ROS:  Please see the history of present illness. All other systems are reviewed and otherwise negative.   PHYSICAL EXAM:  VS:  BP 132/64   Pulse 76   Ht 6\' 1"  (1.854 m)   Wt 191 lb (86.6 kg)   SpO2 98%   BMI 25.20 kg/m  BMI: Body mass index is 25.2 kg/m. Well nourished, well developed, in no acute distress  HEENT: normocephalic, atraumatic  Neck: no JVD, carotid bruits or masses Cardiac:  RRR; no significant murmurs, no rubs, or gallops Lungs:  CTA b/l, no wheezing, rhonchi or rales  Abd: soft, nontender MS: no deformity or atrophy Ext: no edema   Skin: warm and dry, no rash Neuro:  No gross deficits appreciated Psych: euthymic mood, full affect   EKG:  Done today and reviewed by myself shows  SR 76bpm, 1st degree AVBlock, PR 261ms, QT 332ms, QTc 433ms   06/25/2018: TTE Study Conclusions  - Left ventricle: The cavity size was normal. Wall thickness was  normal. Systolic function was normal. The estimated ejection  fraction was in the range of 55% to 60%. Wall motion was normal;  there were no regional wall motion abnormalities. Doppler  parameters are consistent with abnormal left ventricular  relaxation (grade 1 diastolic dysfunction).  - Mitral valve: Mild focal calcification of the anterior leaflet.     07/16/2018: stress myoview Nuclear stress EF: 64%.  There was no ST segment deviation noted during stress.  No T wave inversion was noted during stress.  The study is normal.  This is a low risk study.  The left ventricular ejection fraction is normal (55-65%).      Recent Labs: 04/19/2020: BUN 23; Creatinine, Ser 1.10; Hemoglobin 14.0; Magnesium 2.2; Platelets 182; Potassium 5.1; Sodium 139  No results found for requested labs within last 8760 hours.   CrCl cannot be calculated (Patient's most recent lab result is older than the maximum 21 days allowed.).   Wt Readings from Last 3 Encounters:  05/18/20 191 lb (86.6 kg)  04/09/20 191 lb 3.2 oz (86.7 kg)  04/02/20 182 lb 6.4 oz (82.7 kg)     Other studies reviewed: Additional studies/records reviewed today include: summarized above  ASSESSMENT AND PLAN:  1. Persistent AFib, atypical Aflutter      CHA2DS2Vasc is 4, on Xarelto, appropriately dosed      Tikosyn with stable QTc       No burden by symptoms       BMET and Mag today  We reviewed Tikosyn education, this was review for him, mentions he was given great information during his hospital stay and the Afib clinic  2. HTN     Looks OK, no changes  3. Hx of ETOH, quite years ago 4.  Ongoing smoker     Counseled on quitting. He has cut back       Disposition: F/u with in 56mo, sooner if needed, dsicussed the Afib clinic remains a good resource for him as well if needed.  Current medicines are reviewed at length with the patient today.  The patient did not have any concerns regarding medicines.  Venetia Night, PA-C 05/18/2020 2:59 PM     [address] [place] [address] [phone] (office)  [phone] (fax)

## 2020-05-18 ENCOUNTER — Other Ambulatory Visit: Payer: Self-pay

## 2020-05-18 ENCOUNTER — Ambulatory Visit (INDEPENDENT_AMBULATORY_CARE_PROVIDER_SITE_OTHER): Payer: Medicare Other | Admitting: Physician Assistant

## 2020-05-18 VITALS — BP 132/64 | HR 76 | Ht 73.0 in | Wt 191.0 lb

## 2020-05-18 DIAGNOSIS — I4819 Other persistent atrial fibrillation: Secondary | ICD-10-CM

## 2020-05-18 DIAGNOSIS — Z79899 Other long term (current) drug therapy: Secondary | ICD-10-CM

## 2020-05-18 DIAGNOSIS — Z5181 Encounter for therapeutic drug level monitoring: Secondary | ICD-10-CM | POA: Diagnosis not present

## 2020-05-18 DIAGNOSIS — I10 Essential (primary) hypertension: Secondary | ICD-10-CM

## 2020-05-18 NOTE — Patient Instructions (Addendum)
Medication Instructions:  Your physician recommends that you continue on your current medications as directed. Please refer to the Current Medication list given to you today.  *If you need a refill on your cardiac medications before your next appointment, please call your pharmacy*   Lab Work:  BMET AND [place]    If you have labs (blood work) drawn today and your tests are completely normal, you will receive your results only by: Marland Kitchen MyChart Message (if you have MyChart) OR . A paper copy in the mail If you have any lab test that is abnormal or we need to change your treatment, we will call you to review the results.   Testing/Procedures: NONE ORDERED  TODAY     Follow-Up: At [facility], you and your health needs are our priority.  As part of our continuing mission to provide you with exceptional heart care, we have created designated Provider Care Teams.  These Care Teams include your primary Cardiologist (physician) and Advanced Practice Providers (APPs -  Physician Assistants and Nurse Practitioners) who all work together to provide you with the care you need, when you need it.  We recommend signing up for the patient portal called "MyChart".  Sign up information is provided on this After Visit Summary.  MyChart is used to connect with patients for Virtual Visits (Telemedicine).  Patients are able to view lab/test results, encounter notes, upcoming appointments, etc.  Non-urgent messages can be sent to your provider as well.   To learn more about what you can do with MyChart, go to [email or web address].    Your next appointment:   1 year(s)  The format for your next appointment:   In Person  Provider:   You may see Thompson Grayer, MD or one of the following Advanced Practice Providers on your designated Care Team:    Chanetta Marshall, NP  Tommye Standard, [place]  Legrand Como "Oda Kilts, [place]  Your physician wants you to follow-up in:  IN [place] will  receive a reminder letter in the mail two months in advance. If you don't receive a letter, please call our office to schedule the follow-up appointment.      Other Instructions

## 2020-05-19 LAB — BASIC METABOLIC PANEL
BUN/Creatinine Ratio: 24 (ref 10–24)
BUN: 22 mg/dL (ref 8–27)
CO2: 25 mmol/L (ref 20–29)
Calcium: 8.9 mg/dL (ref 8.6–10.2)
Chloride: 103 mmol/L (ref 96–106)
Creatinine, Ser: 0.9 mg/dL (ref 0.76–1.27)
GFR calc Af Amer: 98 mL/min/{1.73_m2} (ref >59)
GFR calc non Af Amer: 85 mL/min/{1.73_m2} (ref >59)
Glucose: 92 mg/dL (ref 65–99)
Potassium: 4.3 mmol/L (ref 3.5–5.2)
Sodium: 139 mmol/L (ref 134–144)

## 2020-05-19 LAB — MAGNESIUM: Magnesium: 1.9 mg/dL (ref 1.6–2.3)

## 2020-06-09 IMAGING — MR MR CERVICAL SPINE W/O CM
5 series · 36 of 48 positions shown · non-contrast
Comparison: None.

CLINICAL DATA: Right face and arm numbness. Possible weakness.
Symptoms began over the last 2 days.

EXAM:
MRI CERVICAL SPINE WITHOUT CONTRAST
TECHNIQUE: Multiplanar, multisequence MR imaging of the cervical spine was
performed. No intravenous contrast was administered.

[Series 5: T2 · sagittal · 3.0mm · 0.69mm/px · 6 of 15 slices shown (1 of 2)]
[im 1/15]
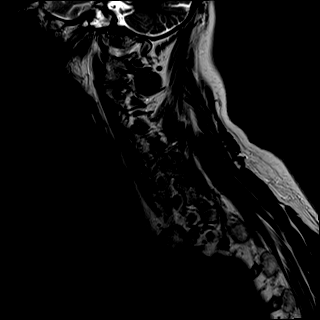
[im 3/15]
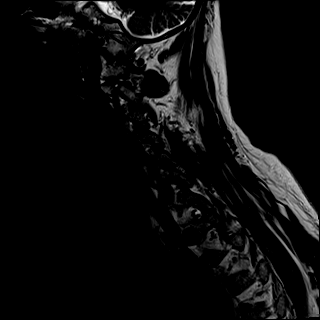
[im 6/15]
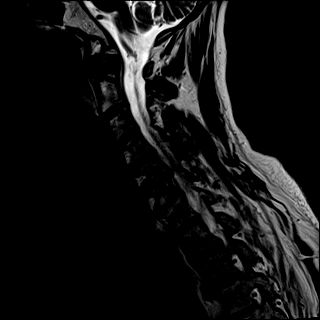
[im 9/15]
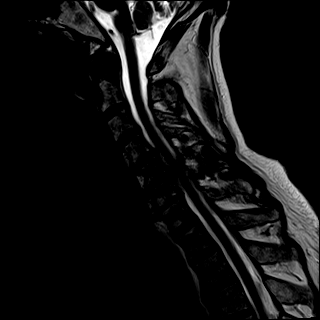
[im 12/15]
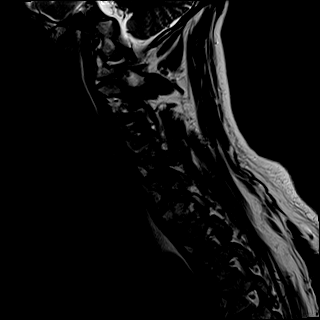
[im 15/15]
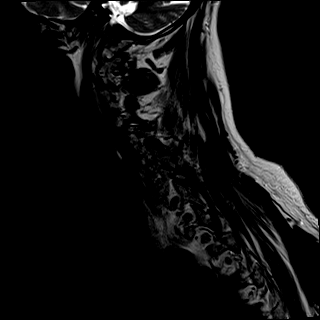

[Series 6: T1 · sagittal · 3.0mm · 0.69mm/px · 5 of 15 slices shown]
[im 1/15]
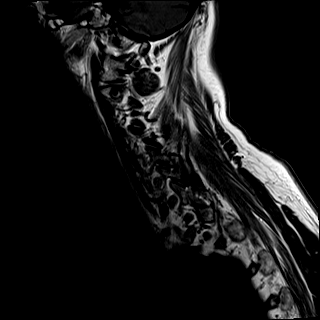
[im 4/15]
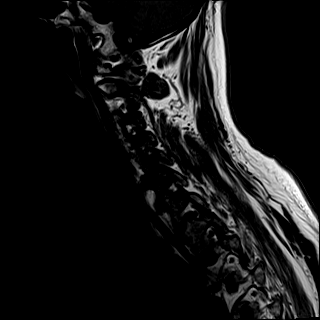
[im 8/15]
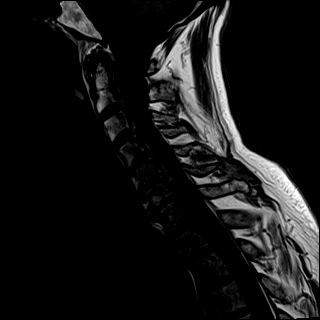
[im 11/15]
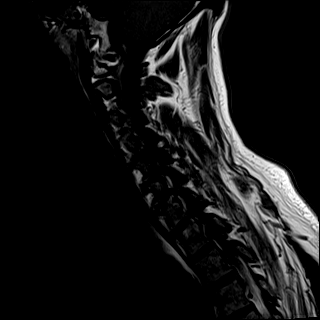
[im 15/15]
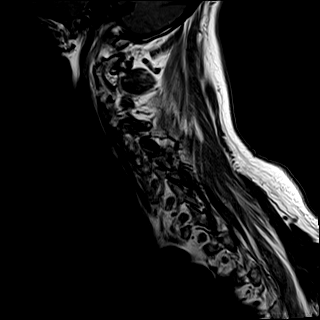

[Series 7: STIR · sagittal · 3.0mm · 0.86mm/px · 5 of 15 slices shown]
[im 1/15]
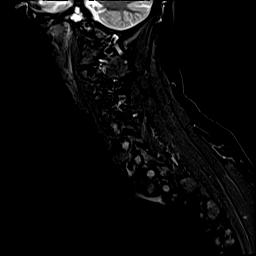
[im 4/15]
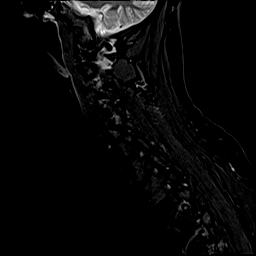
[im 8/15]
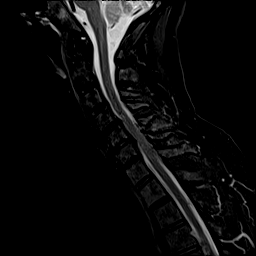
[im 11/15]
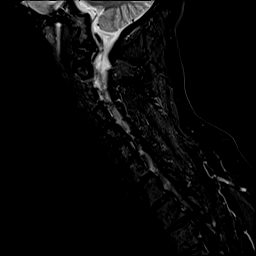
[im 15/15]
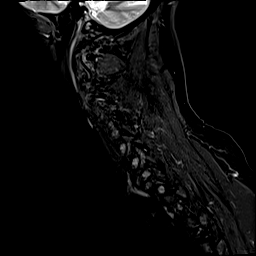

[Series 8: T2 · axial · 3.0mm · 0.66mm/px · z∈[-69,+50]mm · 12 of 44 slices shown (2 of 2)]
[im 1/44]
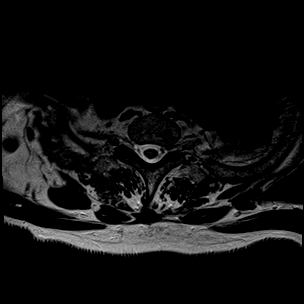
[im 3/44]
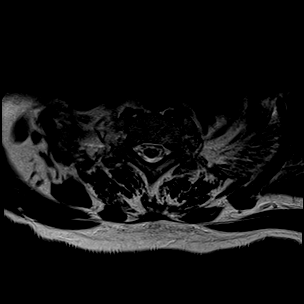
[im 6/44]
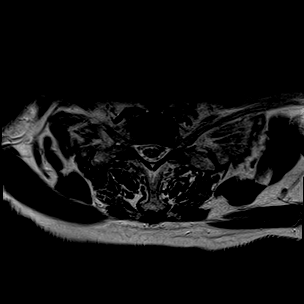
[im 9/44]
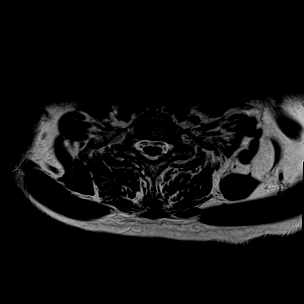
[im 12/44]
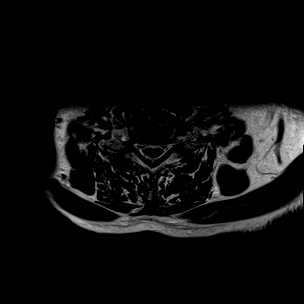
[im 15/44]
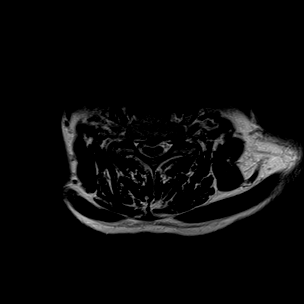
[im 21/44]
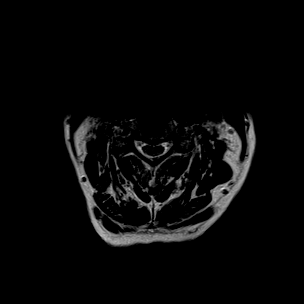
[im 23/44]
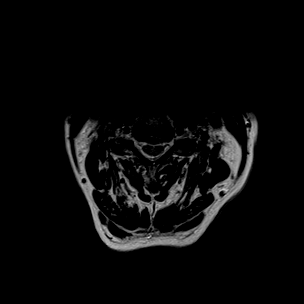
[im 26/44]
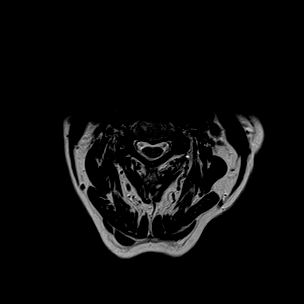
[im 32/44]
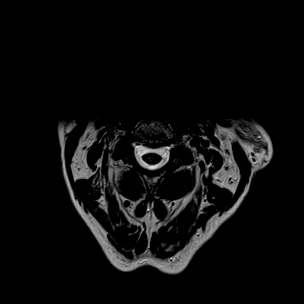
[im 38/44]
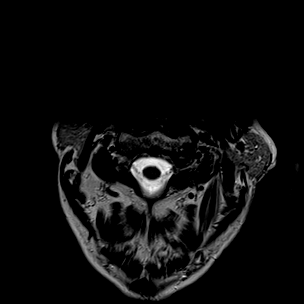
[im 44/44]
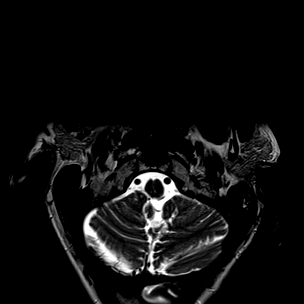

[Series 9: GRE · axial · 3.0mm · 0.39mm/px · z∈[-64,+50]mm · 8 of 44 slices shown]
[im 3/44]
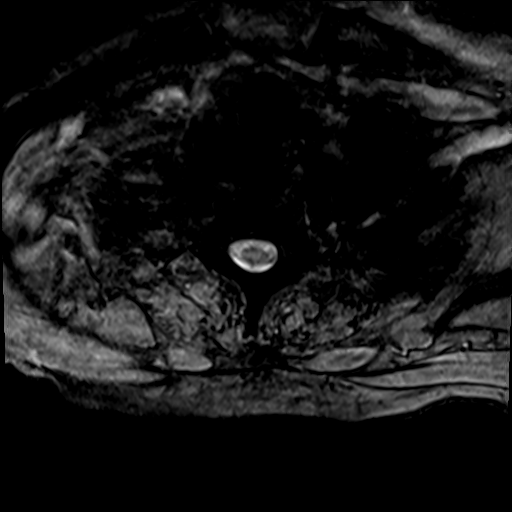
[im 9/44]
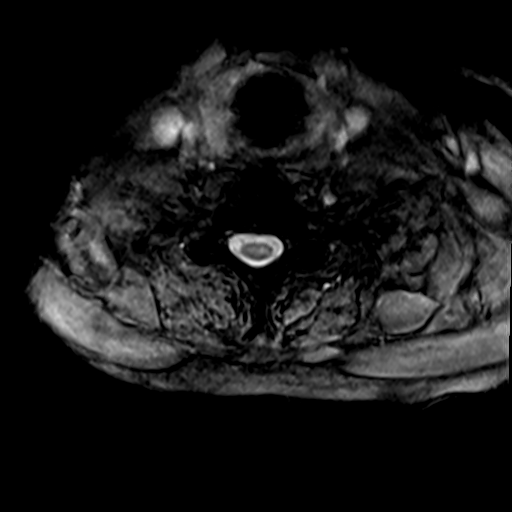
[im 15/44]
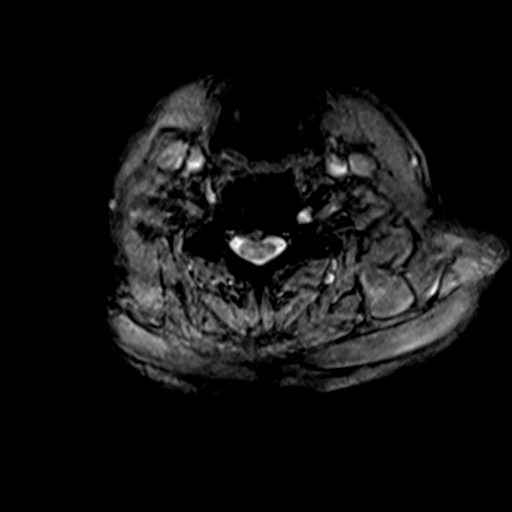
[im 21/44]
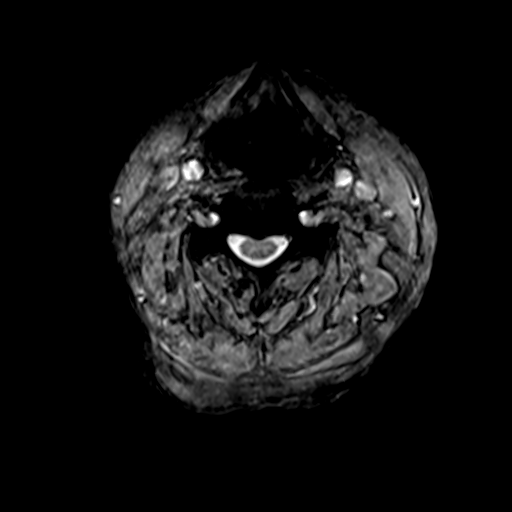
[im 26/44]
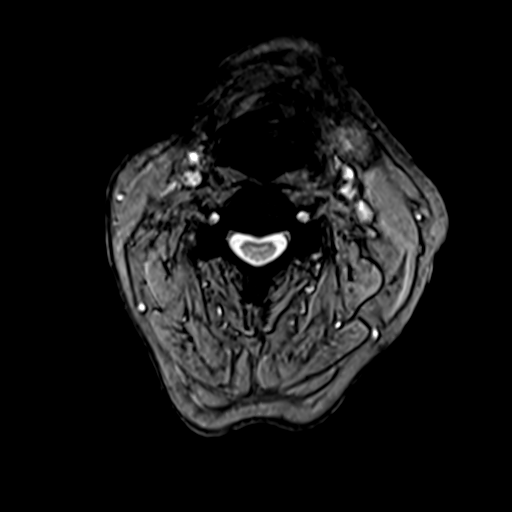
[im 32/44]
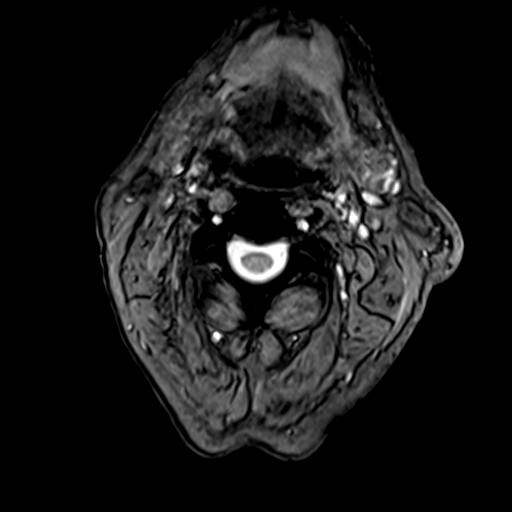
[im 38/44]
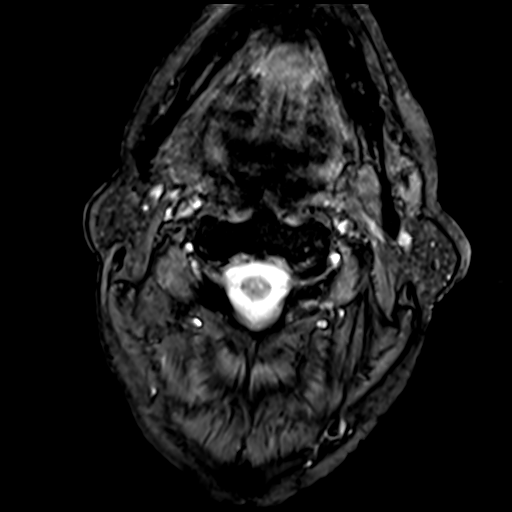
[im 44/44]
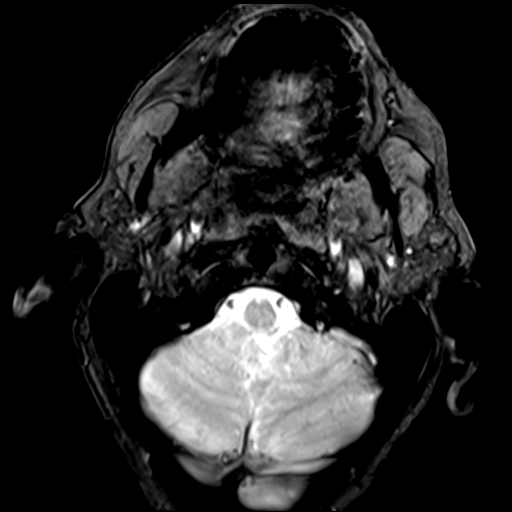

[36 of 48 positions shown; findings below may reference images not displayed]

FINDINGS: Alignment: Straightening of the normal cervical lordosis. One or 2
mm anterolisthesis C4-5. One or 2 mm retrolisthesis C5-6.

Vertebrae: Discogenic endplate changes.  No primary bone finding.

Cord: No cord compression or primary cord lesion.

Posterior Fossa, vertebral arteries, paraspinal tissues: Negative

Disc levels:

Foramen magnum is widely patent. Ordinary osteoarthritis at the C1-2
articulation but without encroachment upon the neural structures.

C2-3: Normal

C3-4: Mild bulging of the disc. Mild right-sided facet degeneration.
No canal stenosis. Mild right foraminal stenosis.

C4-5: Central bulging of the disc effaces the ventral subarachnoid
space but does not compress the cord. Foramina are sufficiently
patent.

C5-6: Spondylosis with endplate osteophytes and central disc
herniation with caudal down turning behind the upper portion of C6.
Effacement of the subarachnoid space. AP diameter of the canal only
8.2 mm. Slight indentation of the cord. Question early abnormal T2
signal within the cord. Bilateral foraminal stenosis could affect
either exiting C6 nerve.

C6-7: Spondylosis with endplate osteophytes and bulging of the disc.
Indentation of the ventral subarachnoid space but no compressive
effect upon the cord. Mild bilateral foraminal narrowing.

C7-T1: Normal interspace.
IMPRESSION: At C5-6, there is spondylosis with a central disc herniation with
caudal down turning behind upper C6. Canal stenosis with AP diameter
of 8.2 mm. Some cord deformity, possibly with early abnormal T2
signal within the cord. Bilateral foraminal stenosis could compress
either C6 nerve.

At C4-5, there is a central disc herniation that effaces the ventral
subarachnoid space and contacts the ventral cord but does not appear
to cause cord compression. Foramina widely patent.

C3-4: Disc bulge. Right-sided facet arthropathy. Right foraminal
narrowing could possibly affect the right C4 nerve.

## 2020-10-07 ENCOUNTER — Other Ambulatory Visit (HOSPITAL_COMMUNITY): Payer: Self-pay | Admitting: Nurse Practitioner

## 2020-10-07 MED ORDER — POTASSIUM CHLORIDE CRYS ER 20 MEQ PO TBCR: 20.0000 meq | EXTENDED_RELEASE_TABLET | Freq: Every day | ORAL | 2 refills | Status: AC

## 2020-10-26 ENCOUNTER — Other Ambulatory Visit: Payer: Self-pay | Admitting: Student

## 2020-10-27 ENCOUNTER — Other Ambulatory Visit: Payer: Self-pay | Admitting: Student

## 2020-10-29 ENCOUNTER — Other Ambulatory Visit: Payer: Self-pay | Admitting: Student

## 2020-10-29 ENCOUNTER — Other Ambulatory Visit: Payer: Self-pay

## 2020-10-29 ENCOUNTER — Telehealth (HOSPITAL_COMMUNITY): Payer: Self-pay | Admitting: *Deleted

## 2020-10-29 MED ORDER — DOFETILIDE 500 MCG PO CAPS: 500.0000 ug | ORAL_CAPSULE | Freq: Two times a day (BID) | ORAL | 0 refills | Status: DC

## 2020-10-29 NOTE — Telephone Encounter (Signed)
Sent in 30 day supply of Dofetilide.  Will route to [facility] to call pt to schedule and she can let pt know [facility]

## 2020-10-29 NOTE — Telephone Encounter (Signed)
Pt needs refill of dofetilide 524mcg BID sent to [facility] on [address].  Pt sees Donnajean Lopes [place] - supposed to follow up with her in March. Please refill and call pt to have appt scheduled.

## 2020-11-01 ENCOUNTER — Other Ambulatory Visit: Payer: Self-pay

## 2020-11-01 MED ORDER — DOFETILIDE 500 MCG PO CAPS: 500.0000 ug | ORAL_CAPSULE | Freq: Two times a day (BID) | ORAL | 2 refills | Status: AC

## 2020-11-01 NOTE — Telephone Encounter (Signed)
Pt's medication was sent to pt's pharmacy as requested. Confirmation received.  °

## 2020-11-14 NOTE — Progress Notes (Deleted)
Cardiology Office Note Date:  11/14/2020  Patient ID:  Samuel Ferrell, Samuel Ferrell Sep 08, 1946, MRN [id number] PCP:  Haywood Pao, MD  Cardiologist:  Dr. Geraldo Pitter EP: Dr. Rayann Heman     Chief Complaint: ***  History of Present Illness: Samuel Ferrell is a 75 y.o. male with history of stroke, HTN, HLD, AFib/atypical flutter, tobacco, ETOH abuse.  He comes in today to be seen for Dr. Rayann Heman, s/p Tikosyn initiation.  At his f/u AFib clinic visit he noted some breakthrough arrhythmia and his BB was up titrated with improvement.  I saw him 05/18/20 "I feel great" He has not felt like he has had any more AFib.  No CP, palpitations, no near syncope or syncope, no SOB No bleeding or signs of bleeding Labs were updated, no changes were made   *** symptoms *** tikosyn EKG, labs *** bleeding, xarelto, labs   AFib hx AFib ablations (x2) 2007, 2009 w/Dr. [name] .  AAD Hx Flecainide started 2015  >> stopped 03/2020 w/ recurrent AF Tikosyn started 03/2020  Past Medical History:  Diagnosis Date  . Afib [facility])    s/p ablation x2 by Dr [name] at [facility] in 2007, 2009  . Alcohol abuse   . Atypical atrial flutter ([place])   . COPD (chronic obstructive pulmonary disease) ([place])    pt denies  . CVA (cerebral infarction)   . Hyperlipidemia   . Hypertension   . Palpitations   . Skin cancer--melanoma   . Stroke [facility])    TIA    Past Surgical History:  Procedure Laterality Date  . ANTERIOR CERVICAL DECOMP/DISCECTOMY FUSION N/A 07/24/2018   Procedure: ANTERIOR CERVICAL DECOMPRESSION/DISCECTOMY FUSION, INTERBODY PROSTHESIS, PLATE SCREWS CERVICAL FIVE- CERVICAL SIX;  Surgeon: Newman Pies, MD;  Location: [place];  Service: Neurosurgery;  Laterality: N/A;  ANTERIOR CERVICAL DECOMPRESSION/DISCECTOMY FUSION, INTERBODY PROSTHESIS, PLATE SCREWS CERVICAL FIVE- CERVICAL SIX  . APPENDECTOMY    . ATRIAL FIBRILLATION ABLATION  2007 ,2009   Dr [name] at [place]     right clavicle     Current Outpatient Medications  Medication Sig Dispense Refill  . atorvastatin (LIPITOR) 80 MG tablet Take 40 mg by mouth every evening.     . dofetilide (TIKOSYN) 500 MCG capsule Take 1 capsule (500 mcg total) by mouth 2 (two) times daily. 180 capsule 2  . enalapril (VASOTEC) 20 MG tablet Take 20 mg by mouth at bedtime.     . ferrous sulfate 325 (65 FE) MG tablet Take 325 mg by mouth daily with breakfast.    . finasteride (PROSCAR) 5 MG tablet Take 5 mg by mouth daily.    . metoprolol tartrate (LOPRESSOR) 50 MG tablet Take 1.5 tablets (75 mg total) by mouth 2 (two) times daily. 270 tablet 3  . Multiple Vitamin (MULTIVITAMIN) tablet Take 1 tablet by mouth daily. Men's One A Day - [facility]    . omeprazole (PRILOSEC) 20 MG capsule Take 20 mg by mouth daily at 12 noon.     . potassium chloride SA (KLOR-CON M20) 20 MEQ tablet Take 1 tablet (20 mEq total) by mouth daily. 90 tablet 2  . SUPER B COMPLEX/C CAPS Take 1 capsule by mouth daily.    [name] 20 MG TABS tablet Take 20 mg by mouth daily at 6 PM.      No current facility-administered medications for this visit.    Allergies:   Patient has no known allergies.   Social History:  The patient  reports  that he has been smoking cigarettes. He has a 35.00 pack-year smoking history. He has never used smokeless tobacco. He reports previous alcohol use. He reports that he does not use drugs.   Family History:  The patient's family history includes Cancer in an other family member.  ROS:  Please see the history of present illness. All other systems are reviewed and otherwise negative.   PHYSICAL EXAM:  VS:  There were no vitals taken for this visit. BMI: There is no height or weight on file to calculate BMI. Well nourished, well developed, in no acute distress  HEENT: normocephalic, atraumatic  Neck: no JVD, carotid bruits or masses Cardiac:  *** RRR; no significant murmurs, no rubs, or gallops Lungs:  *** CTA b/l, no wheezing, rhonchi  or rales  Abd: soft, nontender MS: no deformity or *** atrophy Ext: *** no edema  Skin: warm and dry, no rash Neuro:  No gross deficits appreciated Psych: euthymic mood, full affect   EKG:  Done today and reviewed by myself shows  ***   06/25/2018: TTE Study Conclusions  - Left ventricle: The cavity size was normal. Wall thickness was  normal. Systolic function was normal. The estimated ejection  fraction was in the range of 55% to 60%. Wall motion was normal;  there were no regional wall motion abnormalities. Doppler  parameters are consistent with abnormal left ventricular  relaxation (grade 1 diastolic dysfunction).  - Mitral valve: Mild focal calcification of the anterior leaflet.     07/16/2018: stress myoview Nuclear stress EF: 64%.  There was no ST segment deviation noted during stress.  No T wave inversion was noted during stress.  The study is normal.  This is a low risk study.  The left ventricular ejection fraction is normal (55-65%).      Recent Labs: 04/19/2020: Hemoglobin 14.0; Platelets 182 05/18/2020: BUN 22; Creatinine, Ser 0.90; Magnesium 1.9; Potassium 4.3; Sodium 139  No results found for requested labs within last 8760 hours.   CrCl cannot be calculated (Patient's most recent lab result is older than the maximum 21 days allowed.).   Wt Readings from Last 3 Encounters:  05/18/20 191 lb (86.6 kg)  04/09/20 191 lb 3.2 oz (86.7 kg)  04/02/20 182 lb 6.4 oz (82.7 kg)     Other studies reviewed: Additional studies/records reviewed today include: summarized above  ASSESSMENT AND PLAN:  1. Persistent AFib, atypical Aflutter      CHA2DS2Vasc is 4, on Xarelto, appropriately dosed      *** Tikosyn with stable QTc      *** No burden by symptoms      ***  BMET and Mag today  *** We reviewed Tikosyn education   2. HTN     *** Looks OK, no changes  3. Hx of ETOH, quite years ago 4. Ongoing smoker     *** Counseled on quitting. He has  cut back       Disposition: ***   Current medicines are reviewed at length with the patient today.  The patient did not have any concerns regarding medicines.  Samuel Night, PA-C 11/14/2020 12:38 PM     [address] [place] [address] [phone] (office)  [phone] (fax)

## 2020-11-16 ENCOUNTER — Ambulatory Visit: Payer: Medicare Other | Admitting: Physician Assistant

## 2020-11-16 NOTE — Progress Notes (Unsigned)
Cardiology Office Note Date:  11/16/2020  Patient ID:  Samuel, Ferrell February 28, 1946, MRN [id number] PCP:  Samuel Pao, MD  Cardiologist:  Dr. Geraldo Pitter EP: Dr. Rayann Heman     Chief Complaint: 6 mo visit  History of Present Illness: [name] is a 75 y.o. male with history of stroke, HTN, HLD, AFib/atypical flutter, tobacco, ETOH abuse.  He comes in today to be seen for Dr. Rayann Heman, s/p Tikosyn initiation.  At his f/u AFib clinic visit he noted some breakthrough arrhythmia and his BB was up titrated with improvement.  I saw him 05/18/20 "I feel great" He has not felt like he has had any more AFib.  No CP, palpitations, no near syncope or syncope, no SOB No bleeding or signs of bleeding Labs were updated, no changes were made   TODAY He feels very well. Upset about his new insurance plan, feels his company mis-guided him and his co-pays for medicines all went up and the coverage runs out faster. He is looking to get re-established with the [place]. He is very active outside, yesterday helping a neighbor cut thie tress/wood. No exertional intolerances, with heavy work like that will get a bit winded, no SOB/DOE otherwise. No CP Rarely has a seconds of a quick heart beat, no AF No bleeding or signs of bleeding   AFib hx AFib ablations (x2) 2007, 2009 w/Dr. [name] .  AAD Hx Flecainide started 2015  >> stopped 03/2020 w/ recurrent AF Tikosyn started 03/2020  Past Medical History:  Diagnosis Date  . Afib [facility])    s/p ablation x2 by Dr [name] at [facility] in 2007, 2009  . Alcohol abuse   . Atypical atrial flutter ([place])   . COPD (chronic obstructive pulmonary disease) ([place])    pt denies  . CVA (cerebral infarction)   . Hyperlipidemia   . Hypertension   . Palpitations   . Skin cancer--melanoma   . Stroke [facility])    TIA    Past Surgical History:  Procedure Laterality Date  . ANTERIOR CERVICAL DECOMP/DISCECTOMY FUSION N/A 07/24/2018   Procedure: ANTERIOR CERVICAL  DECOMPRESSION/DISCECTOMY FUSION, INTERBODY PROSTHESIS, PLATE SCREWS CERVICAL FIVE- CERVICAL SIX;  Surgeon: Newman Pies, MD;  Location: [place];  Service: Neurosurgery;  Laterality: N/A;  ANTERIOR CERVICAL DECOMPRESSION/DISCECTOMY FUSION, INTERBODY PROSTHESIS, PLATE SCREWS CERVICAL FIVE- CERVICAL SIX  . APPENDECTOMY    . ATRIAL FIBRILLATION ABLATION  2007 ,2009   Dr [name] at [place]     right clavicle    Current Outpatient Medications  Medication Sig Dispense Refill  . atorvastatin (LIPITOR) 80 MG tablet Take 40 mg by mouth every evening.     . dofetilide (TIKOSYN) 500 MCG capsule Take 1 capsule (500 mcg total) by mouth 2 (two) times daily. 180 capsule 2  . enalapril (VASOTEC) 20 MG tablet Take 20 mg by mouth at bedtime.     . ferrous sulfate 325 (65 FE) MG tablet Take 325 mg by mouth daily with breakfast.    . finasteride (PROSCAR) 5 MG tablet Take 5 mg by mouth daily.    . metoprolol tartrate (LOPRESSOR) 50 MG tablet Take 1.5 tablets (75 mg total) by mouth 2 (two) times daily. 270 tablet 3  . Multiple Vitamin (MULTIVITAMIN) tablet Take 1 tablet by mouth daily. Men's One A Day - [employer] Brand    . omeprazole (PRILOSEC) 20 MG capsule Take 20 mg by mouth daily at 12 noon.     . potassium chloride SA (KLOR-CON  M20) 20 MEQ tablet Take 1 tablet (20 mEq total) by mouth daily. 90 tablet 2  . SUPER B COMPLEX/C CAPS Take 1 capsule by mouth daily.    [name] 20 MG TABS tablet Take 20 mg by mouth daily at 6 PM.      No current facility-administered medications for this visit.    Allergies:   Patient has no known allergies.   Social History:  The patient  reports that he has been smoking cigarettes. He has a 35.00 pack-year smoking history. He has never used smokeless tobacco. He reports previous alcohol use. He reports that he does not use drugs.   Family History:  The patient's family history includes Cancer in an other family member.  ROS:  Please see the history of  present illness. All other systems are reviewed and otherwise negative.   PHYSICAL EXAM:  VS:  There were no vitals taken for this visit. BMI: There is no height or weight on file to calculate BMI. Well nourished, well developed, in no acute distress  HEENT: normocephalic, atraumatic  Neck: no JVD, carotid bruits or masses Cardiac:  RRR; no significant murmurs, no rubs, or gallops Lungs:  CTA b/l, no wheezing, rhonchi or rales  Abd: soft, nontender MS: no deformity or atrophy Ext: no edema  Skin: warm and dry, no rash Neuro:  No gross deficits appreciated Psych: euthymic mood, full affect   EKG:  Done today and reviewed by myself shows  SR 71bpm, qst degree AVblock, PR 265ms, QTc 477ms   06/25/2018: TTE Study Conclusions  - Left ventricle: The cavity size was normal. Wall thickness was  normal. Systolic function was normal. The estimated ejection  fraction was in the range of 55% to 60%. Wall motion was normal;  there were no regional wall motion abnormalities. Doppler  parameters are consistent with abnormal left ventricular  relaxation (grade 1 diastolic dysfunction).  - Mitral valve: Mild focal calcification of the anterior leaflet.     07/16/2018: stress myoview Nuclear stress EF: 64%.  There was no ST segment deviation noted during stress.  No T wave inversion was noted during stress.  The study is normal.  This is a low risk study.  The left ventricular ejection fraction is normal (55-65%).      Recent Labs: 04/19/2020: Hemoglobin 14.0; Platelets 182 05/18/2020: BUN 22; Creatinine, Ser 0.90; Magnesium 1.9; Potassium 4.3; Sodium 139  No results found for requested labs within last 8760 hours.   CrCl cannot be calculated (Patient's most recent lab result is older than the maximum 21 days allowed.).   Wt Readings from Last 3 Encounters:  05/18/20 191 lb (86.6 kg)  04/09/20 191 lb 3.2 oz (86.7 kg)  04/02/20 182 lb 6.4 oz (82.7 kg)     Other studies  reviewed: Additional studies/records reviewed today include: summarized above  ASSESSMENT AND PLAN:  1. Persistent AFib, atypical Aflutter      CHA2DS2Vasc is 4, on Xarelto, appropriately dosed      Tikosyn with stable QTc      No burden by symptoms      BMET, CBC and Mag today  Med list reviewed  2. HTN     Looks OK, no changes  3. Hx of ETOH, quite years ago 4. Ongoing smoker     Counseled on quitting, he is honest and does not see himself quitting, but will try to smoke less       Disposition: will have him back in 35mo, sooner if  needed   Current medicines are reviewed at length with the patient today.  The patient did not have any concerns regarding medicines.  Venetia Night, PA-C 11/16/2020 7:18 PM     [place] [address] [phone] (office)  [phone] (fax)

## 2020-11-17 ENCOUNTER — Encounter: Payer: Self-pay | Admitting: Physician Assistant

## 2020-11-17 ENCOUNTER — Other Ambulatory Visit: Payer: Self-pay

## 2020-11-17 ENCOUNTER — Ambulatory Visit (INDEPENDENT_AMBULATORY_CARE_PROVIDER_SITE_OTHER): Payer: Medicare Other | Admitting: Physician Assistant

## 2020-11-17 VITALS — BP 116/60 | HR 74 | Ht 73.0 in | Wt 202.0 lb

## 2020-11-17 DIAGNOSIS — Z5181 Encounter for therapeutic drug level monitoring: Secondary | ICD-10-CM | POA: Diagnosis not present

## 2020-11-17 DIAGNOSIS — I4819 Other persistent atrial fibrillation: Secondary | ICD-10-CM | POA: Diagnosis not present

## 2020-11-17 DIAGNOSIS — Z79899 Other long term (current) drug therapy: Secondary | ICD-10-CM

## 2020-11-17 DIAGNOSIS — I10 Essential (primary) hypertension: Secondary | ICD-10-CM | POA: Diagnosis not present

## 2020-11-17 NOTE — Patient Instructions (Signed)
Medication Instructions:   Your physician recommends that you continue on your current medications as directed. Please refer to the Current Medication list given to you today.  *If you need a refill on your cardiac medications before your next appointment, please call your pharmacy*   Lab Work:  BMET [place]   If you have labs (blood work) drawn today and your tests are completely normal, you will receive your results only by: Marland Kitchen MyChart Message (if you have MyChart) OR . A paper copy in the mail If you have any lab test that is abnormal or we need to change your treatment, we will call you to review the results.   Testing/Procedures: NONE ORDERED  TODAY    Follow-Up: At [facility], you and your health needs are our priority.  As part of our continuing mission to provide you with exceptional heart care, we have created designated Provider Care Teams.  These Care Teams include your primary Cardiologist (physician) and Advanced Practice Providers (APPs -  Physician Assistants and Nurse Practitioners) who all work together to provide you with the care you need, when you need it.  We recommend signing up for the patient portal called "MyChart".  Sign up information is provided on this After Visit Summary.  MyChart is used to connect with patients for Virtual Visits (Telemedicine).  Patients are able to view lab/test results, encounter notes, upcoming appointments, etc.  Non-urgent messages can be sent to your provider as well.   To learn more about what you can do with MyChart, go to [email or web address].    Your next appointment:   6 month(s)  The format for your next appointment:   In Person  Provider:   You may see Thompson Grayer, MD or one of the following Advanced Practice Providers on your designated Care Team:    Chanetta Marshall, NP  Tommye Standard, PA-C  Legrand Como "Oda Kilts, [place]    Other Instructions

## 2020-11-18 ENCOUNTER — Telehealth: Payer: Self-pay | Admitting: *Deleted

## 2020-11-18 DIAGNOSIS — R7989 Other specified abnormal findings of blood chemistry: Secondary | ICD-10-CM

## 2020-11-18 DIAGNOSIS — Z79899 Other long term (current) drug therapy: Secondary | ICD-10-CM

## 2020-11-18 DIAGNOSIS — E860 Dehydration: Secondary | ICD-10-CM

## 2020-11-18 LAB — CBC
Hematocrit: 38.7 % (ref 37.5–51.0)
Hemoglobin: 13.1 g/dL (ref 13.0–17.7)
MCH: 31 pg (ref 26.6–33.0)
MCHC: 33.9 g/dL (ref 31.5–35.7)
MCV: 92 fL (ref 79–97)
Platelets: 184 10*3/uL (ref 150–450)
RBC: 4.23 x10E6/uL (ref 4.14–5.80)
RDW: 13.7 % (ref 11.6–15.4)
WBC: 4.8 10*3/uL (ref 3.4–10.8)

## 2020-11-18 LAB — BASIC METABOLIC PANEL
BUN/Creatinine Ratio: 15 (ref 10–24)
BUN: 24 mg/dL (ref 8–27)
CO2: 22 mmol/L (ref 20–29)
Calcium: 9.3 mg/dL (ref 8.6–10.2)
Chloride: 105 mmol/L (ref 96–106)
Creatinine, Ser: 1.57 mg/dL — ABNORMAL HIGH (ref 0.76–1.27)
Glucose: 85 mg/dL (ref 65–99)
Potassium: 4.3 mmol/L (ref 3.5–5.2)
Sodium: 140 mmol/L (ref 134–144)
eGFR: 46 mL/min/{1.73_m2} — ABNORMAL LOW (ref >59)

## 2020-11-18 LAB — MAGNESIUM: Magnesium: 2 mg/dL (ref 1.6–2.3)

## 2020-11-18 NOTE — Telephone Encounter (Signed)
Spoke with pt and advised per Tommye Standard, PA-C pt with elevated Creatinine which she suspects pt may be dehydrated.  Pt advised to increase fluid intake repeat BMET.  Appointment scheduled for 11/29/2020.  Pt verbalizes understanding and agrees with current plan.

## 2020-11-18 NOTE — Telephone Encounter (Signed)
-----   Message from [place], [place] sent at 11/18/2020  1:10 PM EST ----- Creat is unusually high for him, I suspect perhaps dehydrated.  Please encourage adequate hydration and a recheck BMET in a week or so.  If not better, we may need to adjust his enalapril, and or Tikosyn.

## 2020-11-18 NOTE — Telephone Encounter (Signed)
Pt called in returning [place] call .  Best number [phone]

## 2020-11-18 NOTE — Addendum Note (Signed)
Addended by: Thora Lance on: 11/18/2020 04:53 PM   Modules accepted: Orders

## 2020-11-18 NOTE — Telephone Encounter (Signed)
Lvm for patient to call back for results and reccomendations ?

## 2020-11-29 ENCOUNTER — Other Ambulatory Visit: Payer: Medicare Other | Admitting: *Deleted

## 2020-11-29 ENCOUNTER — Other Ambulatory Visit: Payer: Self-pay

## 2020-11-29 DIAGNOSIS — R7989 Other specified abnormal findings of blood chemistry: Secondary | ICD-10-CM

## 2020-11-29 DIAGNOSIS — E860 Dehydration: Secondary | ICD-10-CM

## 2020-11-29 DIAGNOSIS — Z79899 Other long term (current) drug therapy: Secondary | ICD-10-CM

## 2020-11-29 LAB — BASIC METABOLIC PANEL
BUN/Creatinine Ratio: 13 (ref 10–24)
BUN: 20 mg/dL (ref 8–27)
CO2: 22 mmol/L (ref 20–29)
Calcium: 9.7 mg/dL (ref 8.6–10.2)
Chloride: 100 mmol/L (ref 96–106)
Creatinine, Ser: 1.49 mg/dL — ABNORMAL HIGH (ref 0.76–1.27)
Glucose: 100 mg/dL — ABNORMAL HIGH (ref 65–99)
Potassium: 4.8 mmol/L (ref 3.5–5.2)
Sodium: 139 mmol/L (ref 134–144)
eGFR: 49 mL/min/{1.73_m2} — ABNORMAL LOW (ref >59)

## 2020-12-01 ENCOUNTER — Other Ambulatory Visit: Payer: Self-pay | Admitting: *Deleted

## 2020-12-01 ENCOUNTER — Telehealth: Payer: Self-pay | Admitting: *Deleted

## 2020-12-01 DIAGNOSIS — Z79899 Other long term (current) drug therapy: Secondary | ICD-10-CM

## 2020-12-01 NOTE — Telephone Encounter (Signed)
Lvm for patient to call back for recommendations based upon results.

## 2020-12-06 NOTE — Telephone Encounter (Signed)
Baldwin Jamaica, PA-C  11/30/2020 2:47 PM EDT      Creat is only slightly better. Reduce his enalapril in 1/2 to 10mg  daily, remind him that he needs to do better with water intake as well. Repeat BMET 2 weeks

## 2020-12-06 NOTE — Telephone Encounter (Signed)
Patient returning Shana's Call from Friday.

## 2020-12-06 NOTE — Telephone Encounter (Signed)
Outreach made to Pt.  Pt advised to reduce enalapril 20 mg- Take 1/2 tablet (10 mg) by mouth daily.  Pt has physical December 31, 2020 with Dr. Osborne Casco and will be getting lab work at that time.  Advised Pt ok to wait to get that lab work and we will get lab work from Dr. Osborne Casco.  Pt indicates understanding.

## 2021-03-07 ENCOUNTER — Other Ambulatory Visit: Payer: Self-pay | Admitting: *Deleted

## 2021-03-07 MED ORDER — ENALAPRIL MALEATE 10 MG PO TABS: 10.0000 mg | ORAL_TABLET | Freq: Every day | ORAL | 3 refills | Status: AC

## 2021-08-28 ENCOUNTER — Ambulatory Visit
Admission: EM | Admit: 2021-08-28 | Discharge: 2021-08-28 | Disposition: A | Discharge status: Home / Self Care | Payer: Medicare Other | Attending: Family Medicine | Admitting: Family Medicine

## 2021-08-28 ENCOUNTER — Other Ambulatory Visit: Payer: Self-pay

## 2021-08-28 DIAGNOSIS — K047 Periapical abscess without sinus: Secondary | ICD-10-CM

## 2021-08-28 MED ORDER — LIDOCAINE VISCOUS HCL 2 % MT SOLN: 10.0000 mL | OROMUCOSAL | 0 refills | Status: DC | PRN

## 2021-08-28 MED ORDER — AMOXICILLIN-POT CLAVULANATE 875-125 MG PO TABS: 1.0000 | ORAL_TABLET | Freq: Two times a day (BID) | ORAL | 0 refills | Status: DC

## 2021-08-28 NOTE — ED Triage Notes (Signed)
Patient states he has an abscess on the  top right side of his mouth since yesterday. He states he was eating and a piece of his tooth broke off.   Patient states he took Asprin last night.   [facility] but he needs something for the infection  Denies Fever

## 2021-08-28 NOTE — ED Provider Notes (Signed)
[facility]    CSN: [id number] Arrival date & time: 08/28/21  1217      History   Chief Complaint Chief Complaint  Patient presents with   Dental Pain    HPI Samuel Ferrell is a 75 y.o. male.   Patient presenting today with 1 day history of right upper dental pain after breaking a piece off of his tooth yesterday.  Woke up with severe pain, facial swelling.  Denies fever, chills, drainage, dysphagia.  Took an aspirin which did not help.  Awaiting to get into the [place] dental clinic to get the tooth removed.   Past Medical History:  Diagnosis Date   Afib [facility])    s/p ablation x2 by Dr [name] at [facility] in 2007, 2009   Alcohol abuse    Atypical atrial flutter ([place])    COPD (chronic obstructive pulmonary disease) ([place])    pt denies   CVA (cerebral infarction)    Hyperlipidemia    Hypertension    Palpitations    Skin cancer--melanoma    Stroke [facility])    TIA    Patient Active Problem List   Diagnosis Date Noted   Paroxysmal atrial fibrillation ([place]) 03/30/2020   HNP (herniated nucleus pulposus) with myelopathy, 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   Pre-operative cardiovascular examination 07/12/2018   CVA (cerebral vascular accident) ([place]) 06/25/2018   TIA (transient ischemic attack)    PAF (paroxysmal atrial fibrillation) ([place])    Osteoarthritis of spine with radiculopathy, cervical region    Alcohol abuse, daily use    Personal history of colonic polyps 05/28/2014   Chronic anticoagulation 05/28/2014   Atypical atrial flutter ([place]) 01/29/2014   Alcohol abuse 01/29/2014   Tobacco abuse 01/29/2014    Past Surgical History:  Procedure Laterality Date   ANTERIOR CERVICAL DECOMP/DISCECTOMY FUSION N/A 07/24/2018   Procedure: ANTERIOR CERVICAL DECOMPRESSION/DISCECTOMY FUSION, INTERBODY PROSTHESIS, PLATE SCREWS CERVICAL FIVE- CERVICAL SIX;  Surgeon: Newman Pies, MD;  Location: [place];  Service: Neurosurgery;  Laterality: N/A;  ANTERIOR CERVICAL DECOMPRESSION/DISCECTOMY  FUSION, INTERBODY PROSTHESIS, PLATE SCREWS CERVICAL FIVE- CERVICAL SIX   APPENDECTOMY     ATRIAL FIBRILLATION ABLATION  2007 ,2009   Dr [name] at [place]     right clavicle       Home Medications    Prior to Admission medications   Medication Sig Start Date End Date Taking? Authorizing Provider  amoxicillin-clavulanate (AUGMENTIN) 875-125 MG tablet Take 1 tablet by mouth every 12 (twelve) hours. 08/28/21  Yes Volney American, PA-C  lidocaine (XYLOCAINE) 2 % solution Use as directed 10 mLs in the mouth or throat as needed for mouth pain. 08/28/21  Yes Volney American, PA-C  atorvastatin (LIPITOR) 80 MG tablet Take 40 mg by mouth every evening.  01/26/14   [provider]  dofetilide (TIKOSYN) 500 MCG capsule Take 1 capsule (500 mcg total) by mouth 2 (two) times daily. 11/01/20   Allred, Jeneen Rinks, MD  enalapril (VASOTEC) 10 MG tablet Take 1 tablet (10 mg total) by mouth at bedtime. 03/07/21   Allred, Jeneen Rinks, MD  ferrous sulfate 325 (65 FE) MG tablet Take 325 mg by mouth daily with breakfast.    [provider]  finasteride (PROSCAR) 5 MG tablet Take 5 mg by mouth daily. 12/23/18   [provider]  metoprolol tartrate (LOPRESSOR) 50 MG tablet Take 1.5 tablets (75 mg total) by mouth 2 (two) times daily. 04/09/20   Sherran Needs, NP  Multiple Vitamin (MULTIVITAMIN) tablet Take 1 tablet  by mouth daily. Men's One A Day - [employer] Brand    [provider]  omeprazole (PRILOSEC) 20 MG capsule Take 20 mg by mouth daily at 12 noon.  12/01/13   [provider]  potassium chloride SA (KLOR-CON M20) 20 MEQ tablet Take 1 tablet (20 mEq total) by mouth daily. 10/07/20   Allred, Jeneen Rinks, MD  SUPER B COMPLEX/C CAPS Take 1 capsule by mouth daily.    [provider]  XARELTO 20 MG TABS tablet Take 20 mg by mouth daily at 6 PM.  01/26/14   [provider]    Family History Family History  Problem Relation Age of Onset    Cancer Other    Colon cancer Neg Hx    Esophageal cancer Neg Hx    Rectal cancer Neg Hx    Stomach cancer Neg Hx     Social History Social History   Tobacco Use   Smoking status: Every Day    Packs/day: 1.00    Years: 35.00    Pack years: 35.00    Types: Cigarettes   Smokeless tobacco: Never   Tobacco comments:    pack a day  Vaping Use   Vaping Use: Never used  Substance Use Topics   Alcohol use: Not Currently    Comment: heavy, up to 6 beers per day   Drug use: No     Allergies   Patient has no known allergies.   Review of Systems Review of Systems Per HPI  Physical Exam Triage Vital Signs ED Triage Vitals  Enc Vitals Group     BP 08/28/21 1344 (!) 146/67     Pulse Rate 08/28/21 1344 99     Resp 08/28/21 1344 18     Temp 08/28/21 1344 97.9 F (36.6 C)     Temp Source 08/28/21 1344 Oral     SpO2 08/28/21 1344 97 %     Weight --      Height --      Head Circumference --      Peak Flow --      Pain Score 08/28/21 1341 8     Pain Loc --      Pain Edu? --      Excl. in [place]? --    No data found.  Updated Vital Signs BP (!) 146/67 (BP Location: Right Arm)    Pulse 99    Temp 97.9 F (36.6 C) (Oral)    Resp 18    SpO2 97%   Visual Acuity Right Eye Distance:   Left Eye Distance:   Bilateral Distance:    Right Eye Near:   Left Eye Near:    Bilateral Near:     Physical Exam Vitals and nursing note reviewed.  Constitutional:      Appearance: Normal appearance.  HENT:     Head: Atraumatic.     Mouth/Throat:     Mouth: Mucous membranes are moist.     Comments: Significant right-sided facial swelling, right upper gingival erythema, edema.  Poor dentition Eyes:     Extraocular Movements: Extraocular movements intact.     Conjunctiva/sclera: Conjunctivae normal.  Cardiovascular:     Rate and Rhythm: Normal rate.  Pulmonary:     Effort: Pulmonary effort is normal.     Breath sounds: Normal breath sounds.  Musculoskeletal:        General: Normal  range of motion.     Cervical back: Normal range of motion and neck supple.  Skin:  General: Skin is warm and dry.  Neurological:     Motor: No weakness.     Gait: Gait normal.  Psychiatric:        Mood and Affect: Mood normal.        Thought Content: Thought content normal.        Judgment: Judgment normal.     UC Treatments / Results  Labs (all labs ordered are listed, but only abnormal results are displayed) Labs Reviewed - No data to display  EKG   Radiology No results found.  Procedures Procedures (including critical care time)  Medications Ordered in UC Medications - No data to display  Initial Impression / Assessment and Plan / UC Course  I have reviewed the triage vital signs and the nursing notes.  Pertinent labs & imaging results that were available during my care of the patient were reviewed by me and considered in my medical decision making (see chart for details).     Treat with Augmentin, viscous lidocaine, salt water rinses while awaiting dental follow-up.  Return for acutely worsening symptoms.  Final Clinical Impressions(s) / UC Diagnoses   Final diagnoses:  Dental infection   Discharge Instructions   None    ED Prescriptions     Medication Sig Dispense Auth. Provider   amoxicillin-clavulanate (AUGMENTIN) 875-125 MG tablet Take 1 tablet by mouth every 12 (twelve) hours. 14 tablet Volney American, [place]   lidocaine (XYLOCAINE) 2 % solution Use as directed 10 mLs in the mouth or throat as needed for mouth pain. 100 mL Volney American, [place]      PDMP not reviewed this encounter.   Volney American, [place] 08/28/21 1357

## 2021-11-09 ENCOUNTER — Other Ambulatory Visit (HOSPITAL_BASED_OUTPATIENT_CLINIC_OR_DEPARTMENT_OTHER): Payer: Self-pay | Admitting: Internal Medicine

## 2021-11-09 DIAGNOSIS — I701 Atherosclerosis of renal artery: Secondary | ICD-10-CM

## 2021-11-10 ENCOUNTER — Ambulatory Visit (HOSPITAL_BASED_OUTPATIENT_CLINIC_OR_DEPARTMENT_OTHER)
Admission: RE | Admit: 2021-11-10 | Discharge: 2021-11-10 | Disposition: A | Discharge status: Home / Self Care | Payer: No Typology Code available for payment source | Source: Ambulatory Visit | Attending: Internal Medicine | Admitting: Internal Medicine

## 2021-11-10 ENCOUNTER — Other Ambulatory Visit: Payer: Self-pay

## 2021-11-10 DIAGNOSIS — I701 Atherosclerosis of renal artery: Secondary | ICD-10-CM | POA: Diagnosis not present

## 2021-11-10 LAB — POCT I-STAT CREATININE: Creatinine, Ser: 1.5 mg/dL — ABNORMAL HIGH (ref 0.61–1.24)

## 2021-11-10 MED ORDER — IOHEXOL 350 MG/ML SOLN
100.0000 mL | Freq: Once | INTRAVENOUS | Status: AC | PRN
  Administered 2021-11-10: 60 mL via INTRAVENOUS

## 2021-12-21 ENCOUNTER — Encounter: Payer: Self-pay | Admitting: Vascular Surgery

## 2021-12-21 ENCOUNTER — Ambulatory Visit (INDEPENDENT_AMBULATORY_CARE_PROVIDER_SITE_OTHER): Payer: No Typology Code available for payment source | Admitting: Vascular Surgery

## 2021-12-21 VITALS — BP 155/69 | HR 63 | Temp 99.3°F | Resp 18 | Ht 74.0 in | Wt 204.0 lb

## 2021-12-21 DIAGNOSIS — I701 Atherosclerosis of renal artery: Secondary | ICD-10-CM | POA: Diagnosis not present

## 2021-12-21 NOTE — Progress Notes (Signed)
? ? ?[facility] [place] ? ?Patient name: Samuel Ferrell MRN: [id number] DOB: 09-02-1946 Sex: male ? 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 ?HPI: ?Samuel Ferrell is a 76 y.o. 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  He is here today for evaluation of this.  He does have a history of chronic atrial fibrillation on DOAC.  Does have a history of hypertension.  He has mild renal insufficiency with a creatinine of 1.5.  He has no prior history of renal failure.  No history of hemodialysis. ? ?Past Medical History:  ?Diagnosis Date  ? Afib [facility])   ? s/p ablation x2 by Dr [name] at [facility] in 2007, 2009  ? Alcohol abuse   ? 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 COPD (chronic obstructive pulmonary disease) ([place])   ? pt denies  ? CVA (cerebral infarction)   ? Hyperlipidemia   ? Hypertension   ? Palpitations   ? Skin cancer--melanoma   ? Stroke [facility])   ? TIA  ? ? ?Family History  ?Problem Relation Age of Onset  ? Cancer Other   ? Colon cancer Neg Hx   ? Esophageal cancer Neg Hx   ? Rectal cancer Neg Hx   ? Stomach cancer Neg Hx   ? ? ?SOCIAL HISTORY: ?Social History  ? ?Socioeconomic History  ? Marital status: Significant Other  ?  Spouse name: Not on file  ? Number of children: 2  ? Years of education: Not on file  ? Highest education level: Not on file  ?Occupational History  ? Occupation: Retired  ?Tobacco Use  ? Smoking status: Every Day  ?  Packs/day: 1.00  ?  Years: 35.00  ?  Pack years: 35.00  ?  Types: Cigarettes  ? Smokeless tobacco: Never  ? Tobacco comments:  ?  pack a day  ?Vaping Use  ? Vaping  Use: Never used  ?Substance and Sexual Activity  ? Alcohol use: Not Currently  ?  Comment: heavy, up to 6 beers per day  ? Drug use: No  ? Sexual activity: Yes  ?Other Topics Concern  ? Not on file  ?Social History Narrative  ? Lives in [place].  Works as a Scientist, forensic  ? ?Social Determinants of Health  ? ?Financial Resource Strain: Not on file  ?Food Insecurity: Not on file  ?Transportation Needs: Not on file  ?Physical Activity: Not on file  ?Stress: Not on file  ?Social Connections: Not on file  ?Intimate Partner Violence: Not on file  ? ? ?No Known Allergies ? ?Current Outpatient Medications  ?Medication Sig Dispense Refill  ? apixaban (ELIQUIS) 5 MG TABS tablet Take 5 mg by mouth 2 (two) times daily.    ? atorvastatin (LIPITOR) 80 MG tablet Take 40 mg by mouth every evening.     ? dofetilide (TIKOSYN) 500 MCG capsule Take 1 capsule (500 mcg total) by mouth 2 (two) times daily. 180 capsule 2  ? enalapril (VASOTEC) 10 MG tablet Take 1 tablet (10 mg total) by mouth at bedtime. 90 tablet 3  ? ferrous sulfate 325 (65 FE) MG tablet Take  325 mg by mouth daily with breakfast.    ? finasteride (PROSCAR) 5 MG tablet Take 5 mg by mouth daily.    ? lidocaine (XYLOCAINE) 2 % solution Use as directed 10 mLs in the mouth or throat as needed for mouth pain. 100 mL 0  ? metoprolol tartrate (LOPRESSOR) 50 MG tablet Take 1.5 tablets (75 mg total) by mouth 2 (two) times daily. 270 tablet 3  ? Multiple Vitamin (MULTIVITAMIN) tablet Take 1 tablet by mouth daily. Men's One A Day - [employer]    ? omeprazole (PRILOSEC) 20 MG capsule Take 20 mg by mouth daily at 12 noon.     ? potassium chloride SA (KLOR-CON M20) 20 MEQ tablet Take 1 tablet (20 mEq total) by mouth daily. 90 tablet 2  ? SUPER B COMPLEX/C CAPS Take 1 capsule by mouth daily.    ? amoxicillin-clavulanate (AUGMENTIN) 875-125 MG tablet Take 1 tablet by mouth every 12 (twelve) hours. (Patient not taking: Reported on 12/21/2021) 14 tablet 0  ? XARELTO 20 MG TABS  tablet Take 20 mg by mouth daily at 6 PM.  (Patient not taking: Reported on 12/21/2021)    ? ?No current facility-administered medications for this visit.  ? ? ?REVIEW OF SYSTEMS:  ?[X]  denotes positive finding, [ ]  denotes negative finding ?Cardiac  Comments:  ?Chest pain or chest pressure:    ?Shortness of breath upon exertion:    ?Short of breath when lying flat:    ?Irregular heart rhythm: x   ?    ?Vascular    ?Pain in calf, thigh, or hip brought on by ambulation:    ?Pain in feet at night that wakes you up from your sleep:     ?Blood clot in your veins:    ?Leg swelling:     ?    ?Pulmonary    ?Oxygen at home:    ?Productive cough:     ?Wheezing:     ?    ?Neurologic    ?Sudden weakness in arms or legs:     ?Sudden numbness in arms or legs:     ?Sudden onset of difficulty speaking or slurred speech:    ?Temporary loss of vision in one eye:     ?Problems with dizziness:     ?    ?Gastrointestinal    ?Blood in stool:     ?Vomited blood:     ?    ?Genitourinary    ?Burning when urinating:     ?Blood in urine:    ?    ?Psychiatric    ?Major depression:     ?    ?Hematologic    ?Bleeding problems:    ?Problems with blood clotting too easily:    ?    ?Skin    ?Rashes or ulcers:    ?    ?Constitutional    ?Fever or chills:    ? ? ?PHYSICAL EXAM: ?Vitals:  ? 12/21/21 0821  ?BP: (!) 155/69  ?Pulse: 63  ?Resp: 18  ?Temp: 99.3 ?F (37.4 ?C)  ?SpO2: 98%  ?Weight: 204 lb (92.5 kg)  ?Height: 6\' 2"  (1.88 m)  ? ? ?GENERAL: The patient is a well-nourished male, in no acute distress. The vital signs are documented above. ?CARDIOVASCULAR: Abdomen soft nontender.  I do not hear a bruit.  2+ radial pulses. ? ?He does have an area of fullness that he brought to my attention above his inguinal ligament in the right lower quadrant.  This is firm  and does not appear to be consistent with either lipoma or hernia ? ?PULMONARY: There is good air exchange  ?MUSCULOSKELETAL: There are no major deformities or cyanosis. ?NEUROLOGIC: No focal  weakness or paresthesias are detected. ?SKIN: There are no ulcers or rashes noted. ?PSYCHIATRIC: The patient has a normal affect. ? ?DATA:  ?CT scan was reviewed with the patient.  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  He also has 2 separate branch renal artery aneurysms on the right.  These are calcified.  The largest is approximately 1.5 cm ? 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  Radiology has recommended a dedicated CT of his adrenals with washout phase. ? ?His CT of his abdomen only and not his pelvis therefore the mass in his right lower abdominal wall is not visualized ? ?MEDICAL ISSUES: ?Had long discussion with the patient regarding his renal artery stenosis.  He does have changes on his CT scan on the left suggestive of malperfusion of his left kidney.  He does have subtotal occlusion.  I have recommended arteriogram and stenting of his bilateral critical renal artery stenosis.  I did review his films with Dr. Fortunato Curling yesterday who will be doing the procedure.  I did discuss this with Samuel Ferrell and understands.  We will hold his DOAC for the appropriate amount of time and he understands this would be done as an outpatient at [facility]. ? 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 [facility] and Dr. Osborne Casco in [place] ? ? ?Rosetta Posner, MD FACS ?[facility] [place] ?Office Tel [phone] ?Pager [phone] ? ?Note: Portions of this report may have been transcribed using voice recognition software.  Every effort has been made to ensure accuracy; however, inadvertent computerized transcription errors may still be present. ? ?

## 2021-12-29 ENCOUNTER — Other Ambulatory Visit: Payer: Self-pay

## 2022-01-05 ENCOUNTER — Encounter (HOSPITAL_COMMUNITY)
Admission: RE | Disposition: A | Discharge status: Home / Self Care | Payer: Self-pay | Source: Ambulatory Visit | Attending: Vascular Surgery

## 2022-01-05 ENCOUNTER — Other Ambulatory Visit: Payer: Self-pay

## 2022-01-05 ENCOUNTER — Ambulatory Visit (HOSPITAL_COMMUNITY)
Admission: RE | Admit: 2022-01-05 | Discharge: 2022-01-05 | Disposition: A | Discharge status: Home / Self Care | Payer: No Typology Code available for payment source | Source: Ambulatory Visit | Attending: Vascular Surgery | Admitting: Vascular Surgery

## 2022-01-05 DIAGNOSIS — F17210 Nicotine dependence, cigarettes, uncomplicated: Secondary | ICD-10-CM | POA: Insufficient documentation

## 2022-01-05 DIAGNOSIS — I482 Chronic atrial fibrillation, unspecified: Secondary | ICD-10-CM | POA: Diagnosis not present

## 2022-01-05 DIAGNOSIS — I10 Essential (primary) hypertension: Secondary | ICD-10-CM | POA: Insufficient documentation

## 2022-01-05 DIAGNOSIS — I722 Aneurysm of renal artery: Secondary | ICD-10-CM | POA: Insufficient documentation

## 2022-01-05 DIAGNOSIS — I701 Atherosclerosis of renal artery: Secondary | ICD-10-CM

## 2022-01-05 DIAGNOSIS — R1903 Right lower quadrant abdominal swelling, mass and lump: Secondary | ICD-10-CM | POA: Insufficient documentation

## 2022-01-05 DIAGNOSIS — E279 Disorder of adrenal gland, unspecified: Secondary | ICD-10-CM | POA: Diagnosis not present

## 2022-01-05 HISTORY — PX: PERIPHERAL VASCULAR INTERVENTION: CATH118257

## 2022-01-05 HISTORY — PX: RENAL ANGIOGRAPHY: CATH118260

## 2022-01-05 LAB — POCT I-STAT, CHEM 8
BUN: 22 mg/dL (ref 8–23)
Calcium, Ion: 1.24 mmol/L (ref 1.15–1.40)
Chloride: 107 mmol/L (ref 98–111)
Creatinine, Ser: 1.4 mg/dL — ABNORMAL HIGH (ref 0.61–1.24)
Glucose, Bld: 99 mg/dL (ref 70–99)
HCT: 40 % (ref 39.0–52.0)
Hemoglobin: 13.6 g/dL (ref 13.0–17.0)
Potassium: 4.2 mmol/L (ref 3.5–5.1)
Sodium: 142 mmol/L (ref 135–145)
TCO2: 26 mmol/L (ref 22–32)

## 2022-01-05 LAB — POCT ACTIVATED CLOTTING TIME
Activated Clotting Time: 215 seconds
Activated Clotting Time: 167 seconds

## 2022-01-05 SURGERY — RENAL ANGIOGRAPHY
Anesthesia: LOCAL | Laterality: Bilateral

## 2022-01-05 MED ORDER — LIDOCAINE HCL (PF) 1 % IJ SOLN
INTRAMUSCULAR | Status: DC | PRN
  Administered 2022-01-05: 12 mL

## 2022-01-05 MED ORDER — CLOPIDOGREL BISULFATE 75 MG PO TABS: 300.0000 mg | ORAL_TABLET | Freq: Once | ORAL | Status: DC

## 2022-01-05 MED ORDER — LABETALOL HCL 5 MG/ML IV SOLN: 10.0000 mg | INTRAVENOUS | Status: DC | PRN

## 2022-01-05 MED ORDER — MIDAZOLAM HCL 2 MG/2ML IJ SOLN
INTRAMUSCULAR | Status: AC
  Filled 2022-01-05: qty 2

## 2022-01-05 MED ORDER — ACETAMINOPHEN 325 MG PO TABS: 650.0000 mg | ORAL_TABLET | ORAL | Status: DC | PRN

## 2022-01-05 MED ORDER — SODIUM CHLORIDE 0.9 % IV SOLN: 250.0000 mL | INTRAVENOUS | Status: DC | PRN

## 2022-01-05 MED ORDER — MIDAZOLAM HCL 2 MG/2ML IJ SOLN
INTRAMUSCULAR | Status: DC | PRN
  Administered 2022-01-05: 1 mg via INTRAVENOUS

## 2022-01-05 MED ORDER — HEPARIN (PORCINE) IN NACL 1000-0.9 UT/500ML-% IV SOLN
INTRAVENOUS | Status: AC
  Filled 2022-01-05: qty 1000

## 2022-01-05 MED ORDER — FENTANYL CITRATE (PF) 100 MCG/2ML IJ SOLN
INTRAMUSCULAR | Status: DC | PRN
Start: 2022-01-05 — End: 2022-01-05
  Administered 2022-01-05: 25 ug via INTRAVENOUS

## 2022-01-05 MED ORDER — ONDANSETRON HCL 4 MG/2ML IJ SOLN: 4.0000 mg | Freq: Four times a day (QID) | INTRAMUSCULAR | Status: DC | PRN

## 2022-01-05 MED ORDER — HYDRALAZINE HCL 20 MG/ML IJ SOLN: 5.0000 mg | INTRAMUSCULAR | Status: DC | PRN

## 2022-01-05 MED ORDER — CLOPIDOGREL BISULFATE 75 MG PO TABS: 75.0000 mg | ORAL_TABLET | Freq: Every day | ORAL | 11 refills | Status: DC

## 2022-01-05 MED ORDER — HEPARIN (PORCINE) IN NACL 1000-0.9 UT/500ML-% IV SOLN
INTRAVENOUS | Status: DC | PRN
  Administered 2022-01-05 (×2): 500 mL

## 2022-01-05 MED ORDER — LIDOCAINE HCL (PF) 1 % IJ SOLN
INTRAMUSCULAR | Status: AC
  Filled 2022-01-05: qty 30

## 2022-01-05 MED ORDER — CLOPIDOGREL BISULFATE 300 MG PO TABS
ORAL_TABLET | ORAL | Status: AC
  Filled 2022-01-05: qty 1

## 2022-01-05 MED ORDER — HEPARIN SODIUM (PORCINE) 1000 UNIT/ML IJ SOLN
INTRAMUSCULAR | Status: DC | PRN
Start: 2022-01-05 — End: 2022-01-05
  Administered 2022-01-05: 9000 [IU] via INTRAVENOUS

## 2022-01-05 MED ORDER — SODIUM CHLORIDE 0.9 % IV SOLN: INTRAVENOUS | Status: DC

## 2022-01-05 MED ORDER — CLOPIDOGREL BISULFATE 300 MG PO TABS
ORAL_TABLET | ORAL | Status: DC | PRN
  Administered 2022-01-05: 300 mg via ORAL

## 2022-01-05 MED ORDER — SODIUM CHLORIDE 0.9% FLUSH: 3.0000 mL | Freq: Two times a day (BID) | INTRAVENOUS | Status: DC

## 2022-01-05 MED ORDER — SODIUM CHLORIDE 0.9 % IV SOLN: INTRAVENOUS | Status: AC

## 2022-01-05 MED ORDER — FENTANYL CITRATE (PF) 100 MCG/2ML IJ SOLN
INTRAMUSCULAR | Status: AC
  Filled 2022-01-05: qty 2

## 2022-01-05 MED ORDER — SODIUM CHLORIDE 0.9% FLUSH: 3.0000 mL | INTRAVENOUS | Status: DC | PRN

## 2022-01-05 MED ORDER — CLOPIDOGREL BISULFATE 75 MG PO TABS: 75.0000 mg | ORAL_TABLET | Freq: Every day | ORAL | Status: DC

## 2022-01-05 MED ORDER — HEPARIN SODIUM (PORCINE) 1000 UNIT/ML IJ SOLN
INTRAMUSCULAR | Status: AC
  Filled 2022-01-05: qty 10

## 2022-01-05 SURGICAL SUPPLY — 15 items
BALLN VIATRAC 7X15X135 (BALLOONS) ×2
BALLOON VIATRAC 7X15X135 (BALLOONS) IMPLANT
CATH OMNI FLUSH 5F 65CM (CATHETERS) ×1 IMPLANT
GUIDE CATH VISTA JR4 6F (CATHETERS) ×1 IMPLANT
KIT ENCORE 26 ADVANTAGE (KITS) ×1 IMPLANT
KIT MICROPUNCTURE NIT STIFF (SHEATH) ×1 IMPLANT
KIT PV (KITS) ×2 IMPLANT
SHEATH PINNACLE 6F 10CM (SHEATH) ×1 IMPLANT
STENT HERCULINK RX 6.0X15X135 (Permanent Stent) ×1 IMPLANT
STENT HERCULINK RX 6.0X18X135 (Permanent Stent) ×1 IMPLANT
SYR MEDRAD MARK V 150ML (SYRINGE) ×1 IMPLANT
TRANSDUCER W/STOPCOCK (MISCELLANEOUS) ×2 IMPLANT
TRAY PV CATH (CUSTOM PROCEDURE TRAY) ×2 IMPLANT
WIRE BENTSON .035X145CM (WIRE) ×1 IMPLANT
WIRE STABILIZER XS .014X180CM (WIRE) ×2 IMPLANT

## 2022-01-05 NOTE — Progress Notes (Signed)
34fr sheath was removed from patient's RFA using manual pressure.  Pressure was held for 25 minutes and patient tolerated well.  Groin looks good and no hematoma noted.  Tegaderm was applied to the site and post care instructions were given.  Post vitals; WNL ?

## 2022-01-05 NOTE — Discharge Instructions (Signed)
Bilateral renal artery stents placed today successfully.  New prescription for Plavix sent to pharmacy.  Can resume Eliquis on postprocedure day 1.  We will arrange follow-up in 1 month with ultrasound in the office in [place].  Call with questions or concerns. ?

## 2022-01-05 NOTE — Op Note (Signed)
? ? ?Patient name: Samuel Ferrell MRN: [id number] DOB: 29-Nov-1945 Sex: male ? ?01/05/2022 ?Pre-operative Diagnosis: Bilateral high-grade renal artery stenosis with renal insufficiency ?Post-operative diagnosis:  Same ?Surgeon:  Marty Heck, MD ?Procedure Performed: ?1.  Ultrasound-guided access right common femoral artery ?2.  Bilateral renal artery arteriogram and abdominal aortogram including catheter selection of bilateral renal arteries ?3.  Angioplasty and stent of left renal artery (6 mm x 18 mm Herculink) ?4.  Angioplasty and stent of right renal artery (6 mm x 15 mm Herculink and flared with a 7 mm x 15 mm Viatrac) ?5.  55 minutes of monitored moderate conscious sedation time ? ?Contrast: 65 mL ? 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  He was evaluated by my partner Dr. Donnetta Hutching in [place].  He presents today for renal artery arteriogram and possible bilateral renal artery stenting after risk benefits discussed. ? ?Findings: Aortogram initially showed a near subtotal occlusion with 99% stenosis of the left renal artery at the origin.  This was initially selected with a JR4 guide cath and an 014 stabilizer wire and then stented with a 6 mm x 18 mm Herculink with widely patent stent and no residual stenosis and preserved runoff with no dissection into the left kidney.  Given anterior projection of the right renal artery, we then had to evaluate this with a steep left anterior oblique orientation of the C arm showing a greater than 80% stenosis of the right renal artery at the origin.  This was then selected with the [facility] guide cath and 014 stabilizer wire and then stented with a 6 mm x 15 mm Herculink and flared with a 7 mm Viatrac with a widely patent stent and no significant residual stenosis or  dissection and preserved runoff into the right kidney. ?  ?Procedure:  The patient was identified in the holding area and taken to room 8.  The patient was then placed supine on the table and prepped and draped in the usual sterile fashion.  A time out was called.  Ultrasound was used to evaluate the right common femoral artery.  It was patent .  A digital ultrasound image was acquired.  A micropuncture needle was used to access the right common femoral artery under ultrasound guidance.  An 018 wire was advanced without resistance and a micropuncture sheath was placed.  The 018 wire was removed and a benson wire was placed.  The micropuncture sheath was exchanged for a 6 french sheath.  An omniflush catheter was advanced over the wire to the level of L-1.  An abdominal angiogram was obtained including bilateral renal artery arterogram.  After evaluating images initially elected to intervene on the left renal artery given high-grade stenosis with near subtotal occlusion.  Patient was given 100 units/kg IV heparin.  I then used a 6 [place] JR4 guide cath with an 014 stabilizer wire and the ostium of the left renal artery was then selected with the catheter and then advanced my stabilizer wire out into the branch vessel.  I then measured the size of the vessel and selected a 6 mm x 18 mm Herculink that was deployed across the proximal renal artery including across the ostium and I did hang the stent into the aorta about 1 mm.  Excellent results with widely  patent left renal artery.  I then removed the wire and guide cath and then got another abdominal aortogram with our C arm in the 45 degree left oblique orientation given an anterior projection of the right renal artery.  I then selected this with the JR4 guide cath and 014 stabilizer wire and I then selected a 6 mm x 15 mm Herculink that was deployed in the proximal right renal artery across the stenosis and again I hung this stent into the aorta about 1 mm.  There  was some residual stenosis greater than 30% in the origin of the artery, so I elected to flare the stent with a 7 mm Viatrac with excellent results and no significant residual stenosis.  Wires and catheters were removed.  Taken to holding to have the sheath removed in the right groin given significant common femoral disease.  He remained stable. ? ?Plan: Excellent results after bilateral renal artery stenting.  We will load on Plavix and can resume his Eliquis tomorrow.  We will arrange follow-up in 1 month in the office with renal artery duplex ? ? ?Marty Heck, MD ?[facility] ?Office: [phone] ? ? ?

## 2022-01-05 NOTE — H&P (Signed)
History and Physical Interval Note: ? ?01/05/2022 ?8:13 AM ? ?Samuel Ferrell  has presented today for surgery, with the diagnosis of BILATERAL RENAL ARTERY STENOSIS.  The various methods of treatment have been discussed with the patient and family. After consideration of risks, benefits and other options for treatment, the patient has consented to  Procedure(s): ?RENAL ARTERY STENTING (Bilateral) as a surgical intervention.  The patient's history has been reviewed, patient examined, no change in status, stable for surgery.  I have reviewed the patient's chart and labs.  Questions were answered to the patient's satisfaction.   ? ? ?Samuel Ferrell ? ?[facility]ist [facility] [place] ?  ?Patient name: Samuel Ferrell  MRN: [id number]        DOB: 1946-06-04          Sex: male ?  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  ?HPI: ?Samuel Ferrell is a 76 y.o. 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  He is here today for evaluation of this.  He does have a history of chronic atrial fibrillation on DOAC.  Does have a history of hypertension.  He has mild renal insufficiency with a creatinine of 1.5.  He has no prior history of renal failure.  No history of hemodialysis. ?  ?    ?Past Medical History:  ?Diagnosis Date  ? Afib [facility])    ?  s/p ablation x2 by Dr [name] at [facility] in 2007, 2009  ? Alcohol abuse    ? Atypical atrial flutter ([place])    ? COPD (chronic obstructive pulmonary disease) ([place])    ?  pt denies  ? CVA (cerebral infarction)    ? Hyperlipidemia    ? Hypertension    ? Palpitations    ? Skin cancer--melanoma    ? Stroke [facility])    ?  TIA  ?  ?   ?     ?Family History  ?Problem Relation Age of Onset  ? Cancer Other    ? Colon cancer Neg Hx    ? Esophageal cancer Neg Hx    ? Rectal cancer Neg Hx    ? Stomach cancer Neg Hx    ?  ?  ?SOCIAL HISTORY: ?Social History  ?  ?     ?Socioeconomic History  ? Marital status: Significant Other  ?    Spouse name: Not on file  ? Number of children: 2  ? Years of education: Not on file  ? Highest education level: Not on file  ?Occupational History  ? Occupation: Retired  ?Tobacco Use  ? Smoking status: Every Day  ?    Packs/day: 1.00  ?    Years: 35.00  ?    Pack years: 35.00  ?    Types: Cigarettes  ? Smokeless tobacco: Never  ? Tobacco comments:  ?    pack a day  ?Vaping Use  ? Vaping Use: Never used  ?Substance and Sexual Activity  ? Alcohol use: Not Currently  ?    Comment: heavy, up to 6 beers per day  ? Drug use: No  ? Sexual activity: Yes  ?  Other Topics Concern  ? Not on file  ?Social History Narrative  ?  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  ?Social Determinants of Health  ?  ?Financial Resource Strain: Not on file  ?Food Insecurity: Not on file  ?Transportation Needs: Not on file  ?Physical Activity: Not on file  ?Stress: Not on file  ?Social Connections: Not on file  ?Intimate Partner Violence: Not on file  ?  ?  ?No Known Allergies ?  ?      ?Current Outpatient Medications  ?Medication Sig Dispense Refill  ? apixaban (ELIQUIS) 5 MG TABS tablet Take 5 mg by mouth 2 (two) times daily.      ? atorvastatin (LIPITOR) 80 MG tablet Take 40 mg by mouth every evening.       ? dofetilide (TIKOSYN) 500 MCG capsule Take 1 capsule (500 mcg total) by mouth 2 (two) times daily. 180 capsule 2  ? enalapril (VASOTEC) 10 MG tablet Take 1 tablet (10 mg total) by mouth at bedtime. 90 tablet 3  ? ferrous sulfate 325 (65 FE) MG tablet Take 325 mg by mouth daily with breakfast.      ? finasteride (PROSCAR) 5 MG tablet Take 5 mg by mouth daily.      ? lidocaine (XYLOCAINE) 2 % solution Use as directed 10 mLs in  the mouth or throat as needed for mouth pain. 100 mL 0  ? metoprolol tartrate (LOPRESSOR) 50 MG tablet Take 1.5 tablets (75 mg total) by mouth 2 (two) times daily. 270 tablet 3  ? Multiple Vitamin (MULTIVITAMIN) tablet Take 1 tablet by mouth daily. Men's One A Day - [employer] [employer]      ? omeprazole (PRILOSEC) 20 MG capsule Take 20 mg by mouth daily at 12 noon.       ? potassium chloride SA (KLOR-CON M20) 20 MEQ tablet Take 1 tablet (20 mEq total) by mouth daily. 90 tablet 2  ? SUPER B COMPLEX/C CAPS Take 1 capsule by mouth daily.      ? amoxicillin-clavulanate (AUGMENTIN) 875-125 MG tablet Take 1 tablet by mouth every 12 (twelve) hours. (Patient not taking: Reported on 12/21/2021) 14 tablet 0  ? XARELTO 20 MG TABS tablet Take 20 mg by mouth daily at 6 PM.  (Patient not taking: Reported on 12/21/2021)      ?  ?No current facility-administered medications for this visit.  ?  ?  ?REVIEW OF SYSTEMS:  ?[X]  denotes positive finding, [ ]  denotes negative finding ?Cardiac   Comments:  ?Chest pain or chest pressure:      ?Shortness of breath upon exertion:      ?Short of breath when lying flat:      ?Irregular heart rhythm: x    ?       ?Vascular      ?Pain in calf, thigh, or hip brought on by ambulation:      ?Pain in feet at night that wakes you up from your sleep:       ?Blood clot in your veins:      ?Leg swelling:       ?       ?Pulmonary      ?Oxygen at home:      ?Productive cough:       ?Wheezing:       ?       ?Neurologic      ?Sudden weakness in arms or legs:       ?  Sudden numbness in arms or legs:       ?Sudden onset of difficulty speaking or slurred speech:      ?Temporary loss of vision in one eye:       ?Problems with dizziness:       ?       ?Gastrointestinal      ?Blood in stool:       ?Vomited blood:       ?       ?Genitourinary      ?Burning when urinating:       ?Blood in urine:      ?       ?Psychiatric      ?Major depression:       ?       ?Hematologic      ?Bleeding problems:      ?Problems with blood clotting  too easily:      ?       ?Skin      ?Rashes or ulcers:      ?       ?Constitutional      ?Fever or chills:      ?  ?  ?PHYSICAL EXAM: ?   ?Vitals:  ?  12/21/21 0821  ?BP: (!) 155/69  ?Pulse: 63  ?Resp: 18  ?Temp: 99.3 ?F (37.4 ?C)  ?SpO2: 98%  ?Weight: 204 lb (92.5 kg)  ?Height: 6\' 2"  (1.88 m)  ?  ?  ?GENERAL: The patient is a well-nourished male, in no acute distress. The vital signs are documented above. ?CARDIOVASCULAR: Abdomen soft nontender.  I do not hear a bruit.  2+ radial pulses. ?  ?He does have an area of fullness that he brought to my attention above his inguinal ligament in the right lower quadrant.  This is firm and does not appear to be consistent with either lipoma or hernia ?  ?PULMONARY: There is good air exchange  ?MUSCULOSKELETAL: There are no major deformities or cyanosis. ?NEUROLOGIC: No focal weakness or paresthesias are detected. ?SKIN: There are no ulcers or rashes noted. ?PSYCHIATRIC: The patient has a normal affect. ?  ?DATA:  ?CT scan was reviewed with the patient.  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  He also has 2 separate branch renal artery aneurysms on the right.  These are calcified.  The largest is approximately 1.5 cm ?  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  Radiology has recommended a dedicated CT of his adrenals with washout phase. ?  ?His CT of his abdomen only and not his pelvis therefore the mass in his right lower abdominal wall is not visualized ?  ?MEDICAL ISSUES: ?Had long discussion with the patient regarding his renal artery stenosis.  He does have changes on his CT scan on the left suggestive of malperfusion of his left kidney.  He does have subtotal occlusion.  I have recommended arteriogram and stenting of his bilateral critical renal artery stenosis.  I did review his films with Dr. Fortunato Curling yesterday who will be doing the procedure.  I  did discuss this with Mr. Laverta Baltimore and understands.  We will hold his DOAC for the appropriate amount of time and he understands this would be done as an outpatient at [facility]. ?  ?He will

## 2022-01-06 ENCOUNTER — Encounter (HOSPITAL_COMMUNITY): Payer: Self-pay | Admitting: Vascular Surgery

## 2022-02-02 ENCOUNTER — Other Ambulatory Visit: Payer: Self-pay | Admitting: *Deleted

## 2022-02-02 ENCOUNTER — Other Ambulatory Visit: Payer: Self-pay

## 2022-02-02 DIAGNOSIS — I701 Atherosclerosis of renal artery: Secondary | ICD-10-CM

## 2022-02-14 ENCOUNTER — Ambulatory Visit (INDEPENDENT_AMBULATORY_CARE_PROVIDER_SITE_OTHER): Payer: No Typology Code available for payment source | Admitting: Vascular Surgery

## 2022-02-14 ENCOUNTER — Encounter: Payer: Self-pay | Admitting: Vascular Surgery

## 2022-02-14 ENCOUNTER — Ambulatory Visit (HOSPITAL_COMMUNITY)
Admission: RE | Admit: 2022-02-14 | Discharge: 2022-02-14 | Disposition: A | Discharge status: Home / Self Care | Payer: No Typology Code available for payment source | Source: Ambulatory Visit | Attending: Vascular Surgery | Admitting: Vascular Surgery

## 2022-02-14 DIAGNOSIS — I701 Atherosclerosis of renal artery: Secondary | ICD-10-CM | POA: Diagnosis present

## 2022-02-14 NOTE — Progress Notes (Signed)
Patient name: Samuel Ferrell MRN: [id number] DOB: [birth year] Sex: male  REASON FOR VISIT: F/U after bilateral renal artery stenting  HPI: Samuel Ferrell is a 76 y.o. male presents for follow-up after recent bilateral renal artery stenting on 01/05/2022.  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  He also had an 80% stenosis of the right renal artery at the origin.  He had a 6 x 18 Herculink placed in the left renal artery and a 6 x 15 Herculink placed in the right renal artery.  He remains on plavix and eliquis.  He reports he is doing fine since surgery.  Right groin transfemoral access site is doing okay.  States his blood pressures been a little bit better now in the 280K systolic.  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  Does have pending surgery at the [place] this summer for right abdominal wall mass according to him.  Past Medical History:  Diagnosis Date   Afib [facility])    s/p ablation x2 by Dr [name] at [facility] in 2007, 2009   Alcohol abuse    Atypical atrial flutter ([place])    COPD (chronic obstructive pulmonary disease) ([place])    pt denies   CVA (cerebral infarction)    Hyperlipidemia    Hypertension    Palpitations    Skin cancer--melanoma    Stroke [facility])    TIA    Past Surgical History:  Procedure Laterality Date   ANTERIOR CERVICAL DECOMP/DISCECTOMY FUSION N/A 07/24/2018   Procedure: ANTERIOR CERVICAL DECOMPRESSION/DISCECTOMY FUSION, INTERBODY PROSTHESIS, PLATE SCREWS CERVICAL FIVE- CERVICAL SIX;  Surgeon: Newman Pies, MD;  Location: [place];  Service: Neurosurgery;  Laterality: N/A;  ANTERIOR CERVICAL DECOMPRESSION/DISCECTOMY FUSION, INTERBODY PROSTHESIS, PLATE SCREWS CERVICAL FIVE- CERVICAL SIX   APPENDECTOMY     ATRIAL FIBRILLATION ABLATION  2007 ,2009   Dr [name] at [place]     right clavicle   PERIPHERAL VASCULAR INTERVENTION Bilateral 01/05/2022   Procedure: PERIPHERAL  VASCULAR INTERVENTION;  Surgeon: Marty Heck, MD;  Location: [place] CV LAB;  Service: Cardiovascular;  Laterality: Bilateral;  Bilateral Renal Artery Stents   RENAL ANGIOGRAPHY Bilateral 01/05/2022   Procedure: RENAL ARTERY STENTING;  Surgeon: Marty Heck, MD;  Location: [place] CV LAB;  Service: Cardiovascular;  Laterality: Bilateral;    Family History  Problem Relation Age of Onset   Cancer Other    Colon cancer Neg Hx    Esophageal cancer Neg Hx    Rectal cancer Neg Hx    Stomach cancer Neg Hx     SOCIAL HISTORY: Social History   Tobacco Use   Smoking status: Every Day    Packs/day: 1.00    Years: 35.00    Pack years: 35.00    Types: Cigarettes   Smokeless tobacco: Never   Tobacco comments:    pack a day  Substance Use Topics   Alcohol use: Not Currently    Comment: heavy, up to 6 beers per day    No Known Allergies  Current Outpatient Medications  Medication Sig Dispense Refill   apixaban (ELIQUIS) 5 MG TABS tablet Take 5 mg by mouth 2 (two) times daily.     atorvastatin (LIPITOR) 40 MG tablet Take 40 mg by mouth every evening.      clopidogrel (PLAVIX) 75 MG tablet Take 1 tablet (75 mg total) by mouth daily. 30 tablet 11  dofetilide (TIKOSYN) 500 MCG capsule Take 1 capsule (500 mcg total) by mouth 2 (two) times daily. 180 capsule 2   enalapril (VASOTEC) 10 MG tablet Take 1 tablet (10 mg total) by mouth at bedtime. 90 tablet 3   ferrous sulfate 325 (65 FE) MG tablet Take 325 mg by mouth daily with breakfast.     finasteride (PROSCAR) 5 MG tablet Take 5 mg by mouth daily.     Magnesium 250 MG TABS Take 250 mg by mouth daily.     metoprolol tartrate (LOPRESSOR) 50 MG tablet Take 1.5 tablets (75 mg total) by mouth 2 (two) times daily. 270 tablet 3   Multiple Vitamin (MULTIVITAMIN) tablet Take 1 tablet by mouth daily.     Multiple Vitamins-Minerals (ONE-A-DAY MENS HEALTH FORMULA PO) Take 1 tablet by mouth daily.     omeprazole (PRILOSEC) 20 MG  capsule Take 20 mg by mouth daily.     potassium chloride SA (KLOR-CON M20) 20 MEQ tablet Take 1 tablet (20 mEq total) by mouth daily. 90 tablet 2   vitamin C (ASCORBIC ACID) 250 MG tablet Take 250 mg by mouth daily.     No current facility-administered medications for this visit.    REVIEW OF SYSTEMS:  [X]  denotes positive finding, [ ]  denotes negative finding Cardiac  Comments:  Chest pain or chest pressure:    Shortness of breath upon exertion:    Short of breath when lying flat:    Irregular heart rhythm:        Vascular    Pain in calf, thigh, or hip brought on by ambulation:    Pain in feet at night that wakes you up from your sleep:     Blood clot in your veins:    Leg swelling:         Pulmonary    Oxygen at home:    Productive cough:     Wheezing:         Neurologic    Sudden weakness in arms or legs:     Sudden numbness in arms or legs:     Sudden onset of difficulty speaking or slurred speech:    Temporary loss of vision in one eye:     Problems with dizziness:         Gastrointestinal    Blood in stool:     Vomited blood:         Genitourinary    Burning when urinating:     Blood in urine:        Psychiatric    Major depression:         Hematologic    Bleeding problems:    Problems with blood clotting too easily:        Skin    Rashes or ulcers:        Constitutional    Fever or chills:      PHYSICAL EXAM: Vitals:   02/14/22 0859  BP: (!) 150/69  Pulse: (!) 57  Resp: 16  Temp: 97.6 F (36.4 C)  TempSrc: Temporal  SpO2: 96%  Weight: 202 lb (91.6 kg)  Height: 6\' 1"  (1.854 m)    GENERAL: The patient is a well-nourished male, in no acute distress. The vital signs are documented above. CARDIAC: There is a regular rate and rhythm.  VASCULAR:  Right femoral pulse palpable with no hematoma PULMONARY: No respiratory distress. ABDOMEN: Soft and non-tender. MUSCULOSKELETAL: There are no major deformities or cyanosis. NEUROLOGIC: No focal  weakness or  paresthesias are detected. SKIN: There are no ulcers or rashes noted. PSYCHIATRIC: The patient has a normal affect.  DATA:   Renal artery duplex today shows right renal artery stent patent with no evidence of significant high-grade stenosis and good flow in the left renal artery although stent difficult to visualize.  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  Discussed overall his duplex looks good today and he has had no apparent complications from stenting.  I will plan to see him back in 6 months with renal artery duplex for continued surveillance.  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  Discussed with his pending surgery at the [facility] for an abdominal wall mass, he can stop Plavix now that it has been 1 month post-op and transition to an 81 mg aspirin.   Marty Heck, MD [facility] [place] Office: [phone]

## 2022-02-20 ENCOUNTER — Other Ambulatory Visit: Payer: Self-pay | Admitting: *Deleted

## 2022-02-20 DIAGNOSIS — I701 Atherosclerosis of renal artery: Secondary | ICD-10-CM

## 2022-03-31 ENCOUNTER — Other Ambulatory Visit: Payer: Self-pay | Admitting: Physician Assistant

## 2022-03-31 ENCOUNTER — Other Ambulatory Visit (HOSPITAL_COMMUNITY): Payer: Self-pay | Admitting: Physician Assistant

## 2022-03-31 DIAGNOSIS — R911 Solitary pulmonary nodule: Secondary | ICD-10-CM

## 2022-04-05 NOTE — Progress Notes (Unsigned)
Synopsis: Referred for pulmonary nodule by Domingo Pulse, PA-C  Subjective:   PATIENT ID: Samuel Ferrell GENDER: male DOB: 07-25-1946, MRN: [id number]  No chief complaint on file.  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  Otherwise pertinent review of systems is negative.  Past Medical History:  Diagnosis Date   Afib [facility])    s/p ablation x2 by Dr Sampson Goon at [facility] in 2007, 2009   Alcohol abuse    Atypical atrial flutter ([facility])    COPD (chronic obstructive pulmonary disease) ([facility])    pt denies   CVA (cerebral infarction)    Hyperlipidemia    Hypertension    Palpitations    Skin cancer--melanoma    Stroke ([facility])    TIA     Family History  Problem Relation Age of Onset   Cancer Other    Colon cancer Neg Hx    Esophageal cancer Neg Hx    Rectal cancer Neg Hx    Stomach cancer Neg Hx      Past Surgical History:  Procedure Laterality Date   ANTERIOR CERVICAL DECOMP/DISCECTOMY FUSION N/A 07/24/2018   Procedure: ANTERIOR CERVICAL DECOMPRESSION/DISCECTOMY FUSION, INTERBODY PROSTHESIS, PLATE SCREWS CERVICAL FIVE- CERVICAL SIX;  Surgeon: Tressie Stalker, MD;  Location: [facility] OR;  Service: Neurosurgery;  Laterality: N/A;  ANTERIOR CERVICAL DECOMPRESSION/DISCECTOMY FUSION, INTERBODY PROSTHESIS, PLATE SCREWS CERVICAL FIVE- CERVICAL SIX   APPENDECTOMY     ATRIAL FIBRILLATION ABLATION  2007 ,2009   Dr Sampson Goon at [facility]   FRACTURE SURGERY     right clavicle   PERIPHERAL VASCULAR INTERVENTION Bilateral 01/05/2022   Procedure: PERIPHERAL VASCULAR INTERVENTION;  Surgeon: Cephus Shelling, MD;  Location: [facility] INVASIVE CV LAB;  Service: Cardiovascular;  Laterality: Bilateral;  Bilateral Renal Artery Stents   RENAL ANGIOGRAPHY Bilateral  01/05/2022   Procedure: RENAL ARTERY STENTING;  Surgeon: Cephus Shelling, MD;  Location: [facility] INVASIVE CV LAB;  Service: Cardiovascular;  Laterality: Bilateral;    Social History   Socioeconomic History   Marital status: Significant Other    Spouse name: Not on file   Number of children: 2   Years of education: Not on file   Highest education level: Not on file  Occupational History   Occupation: Retired  Tobacco Use   Smoking status: Every Day    Packs/day: 1.00    Years: 35.00    Total pack years: 35.00    Types: Cigarettes   Smokeless tobacco: Never   Tobacco comments:    pack a day  Vaping Use   Vaping Use: Never used  Substance and Sexual Activity   Alcohol use: Not Currently    Comment: heavy, up to 6 beers per day   Drug use: No   Sexual activity: Yes  Other Topics Concern   Not on file  Social History Narrative   Lives in [place].  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     atorvastatin (LIPITOR) 40 MG tablet Take 40 mg by mouth every evening.      clopidogrel (PLAVIX) 75 MG tablet Take 1 tablet (75 mg total) by mouth daily. 30 tablet 11   dofetilide (TIKOSYN) 500 MCG capsule Take 1 capsule (500 mcg total) by mouth 2 (two) times daily. 180 capsule 2   enalapril (VASOTEC) 10 MG tablet Take 1 tablet (10 mg total) by mouth at bedtime. 90 tablet 3   ferrous sulfate 325 (65 FE) MG tablet Take 325 mg by mouth daily with breakfast.     finasteride (PROSCAR) 5 MG tablet Take 5 mg by mouth daily.     Magnesium 250 MG TABS Take  250 mg by mouth daily.     metoprolol tartrate (LOPRESSOR) 50 MG tablet Take 1.5 tablets (75 mg total) by mouth 2 (two) times daily. 270 tablet 3   Multiple Vitamin (MULTIVITAMIN) tablet Take 1 tablet by mouth daily.     Multiple Vitamins-Minerals (ONE-A-DAY MENS HEALTH FORMULA PO) Take 1 tablet by mouth daily.     omeprazole (PRILOSEC) 20 MG capsule Take 20 mg by mouth daily.     potassium chloride SA (KLOR-CON M20) 20 MEQ tablet Take 1 tablet (20 mEq total) by mouth daily. 90 tablet 2   vitamin C (ASCORBIC ACID) 250 MG tablet Take 250 mg by mouth daily.     No facility-administered medications prior to visit.       Objective:   Physical Exam:  General appearance: 76 y.o., male, NAD, conversant  Eyes: anicteric sclerae; PERRL, tracking appropriately HENT: NCAT; MMM Neck: Trachea midline; no lymphadenopathy, no JVD Lungs: CTAB, no crackles, no wheeze, with normal respiratory effort CV: RRR, no murmur  Abdomen: Soft, non-tender; non-distended, BS present  Extremities: No peripheral edema, warm Skin: Normal turgor and texture; no rash Psych: Appropriate affect Neuro: Alert and oriented to person and place, no focal deficit     There were no vitals filed for this visit.   on *** LPM *** RA BMI Readings from Last 3 Encounters:  02/14/22 26.65 kg/m  01/05/22 26.52 kg/m  12/21/21 26.19 kg/m   Wt Readings from Last 3 Encounters:  02/14/22 202 lb (91.6 kg)  01/05/22 201 lb (91.2 kg)  12/21/21 204 lb (92.5 kg)     CBC    Component Value Date/Time   WBC 4.8 11/17/2020 1450   WBC 5.3 04/19/2020 0928   RBC 4.23 11/17/2020 1450   RBC 4.42 04/19/2020 0928   HGB 13.6 01/05/2022 0708   HGB 13.1 11/17/2020 1450   HCT 40.0 01/05/2022 0708   HCT 38.7 11/17/2020 1450   PLT 184 11/17/2020 1450   MCV 92 11/17/2020 1450   MCH 31.0 11/17/2020 1450   MCH 31.7 04/19/2020 0928   MCHC 33.9 11/17/2020 1450   MCHC 33.2 04/19/2020 0928   RDW 13.7 11/17/2020 1450   LYMPHSABS 1.2  06/25/2018 0701   MONOABS 0.6 06/25/2018 0701   EOSABS 0.1 06/25/2018 0701   BASOSABS 0.0 06/25/2018 0701    ***  Chest Imaging: LDCT Chest 03/2022:   Pulmonary Functions Testing Results:     No data to display          FeNO: ***  Pathology: ***  Echocardiogram: ***  Heart Catheterization: ***    Assessment & Plan:    Plan:      Omar Person, MD  Pulmonary Critical  Care 04/05/2022 6:48 PM

## 2022-04-05 NOTE — H&P (View-Only) (Signed)
Synopsis: Referred for pulmonary nodule by Fredirick Lathe, PA-C  Subjective:   PATIENT ID: Samuel Ferrell GENDER: male DOB: June 24, 1946, MRN: [id number]  Chief Complaint  Patient presents with   Pulmonary Consult    Referred by [facility] for SPN. Pt denies any respiratory co's.     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  He says he feels fine overall. He says he has no DOE. He says he has no cough. No fever, drenching night sweats. Over the last year his weight has actually increased.   He says he's had a lipoma removed on flank and then had stents put in kidneys recently at [place]. Stents placed a month ago.    Otherwise pertinent review of systems is negative.  His brother died from lung cancer.   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 He did a little bit of everythingin [employer]. 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 61 py smoker.   Has done some heavy drinking for a while, quit 5 ya.    Past Medical History:  Diagnosis Date   Afib [facility])    s/p ablation x2 by Dr [name] at [facility] in 2007, 2009   Alcohol abuse    Atypical atrial flutter ([place])    COPD (chronic obstructive pulmonary disease) ([place])    pt denies   CVA (cerebral infarction)    Hyperlipidemia    Hypertension    Palpitations    Skin cancer--melanoma    Stroke ([place])    TIA     Family History  Problem Relation Age of Onset   Lung cancer Brother        smoked   Cancer Other    Colon cancer Neg Hx    Esophageal cancer Neg Hx    Rectal cancer Neg Hx    Stomach cancer Neg Hx      Past Surgical History:  Procedure Laterality Date   ANTERIOR  CERVICAL DECOMP/DISCECTOMY FUSION N/A 07/24/2018   Procedure: ANTERIOR CERVICAL DECOMPRESSION/DISCECTOMY FUSION, INTERBODY PROSTHESIS, PLATE SCREWS CERVICAL FIVE- CERVICAL SIX;  Surgeon: Newman Pies, MD;  Location: [place];  Service: Neurosurgery;  Laterality: N/A;  ANTERIOR CERVICAL DECOMPRESSION/DISCECTOMY FUSION, INTERBODY PROSTHESIS, PLATE SCREWS CERVICAL FIVE- CERVICAL SIX   APPENDECTOMY     ATRIAL FIBRILLATION ABLATION  2007 ,2009   Dr [name] at [place]     right clavicle   PERIPHERAL VASCULAR INTERVENTION Bilateral 01/05/2022   Procedure: PERIPHERAL VASCULAR INTERVENTION;  Surgeon: Marty Heck, MD;  Location: [place] CV LAB;  Service: Cardiovascular;  Laterality: Bilateral;  Bilateral Renal Artery Stents   RENAL ANGIOGRAPHY Bilateral 01/05/2022   Procedure: RENAL ARTERY STENTING;  Surgeon: Marty Heck, MD;  Location: [place] CV LAB;  Service: Cardiovascular;  Laterality: Bilateral;    Social History   Socioeconomic History   Marital status: Significant Other    Spouse name: Not on file   Number of children: 2   Years of education: Not on file   Highest education level: Not on file  Occupational History   Occupation: Retired  Tobacco Use   Smoking status: Every Day    Packs/day:  1.00    Years: 61.00    Total pack years: 61.00    Types: Cigarettes   Smokeless tobacco: Never  Vaping Use   Vaping Use: Never used  Substance and Sexual Activity   Alcohol use: Not Currently    Comment: heavy, up to 6 beers per day   Drug use: No   Sexual activity: Yes  Other Topics Concern   Not on file  Social History Narrative   Lives in [place].  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     atorvastatin (LIPITOR) 40 MG tablet Take 40 mg by mouth every evening.      clopidogrel (PLAVIX) 75 MG tablet Take 1 tablet (75 mg total) by mouth daily. 30 tablet 11   dofetilide (TIKOSYN) 500 MCG capsule Take 1 capsule (500 mcg total) by mouth 2 (two) times daily. 180 capsule 2   enalapril (VASOTEC) 10 MG tablet Take 1 tablet (10 mg total) by mouth at bedtime. 90 tablet 3   ferrous sulfate 325 (65 FE) MG tablet Take 325 mg by mouth daily with breakfast.     finasteride (PROSCAR) 5 MG tablet Take 5 mg by mouth daily.     Magnesium 250 MG TABS Take 250 mg by mouth daily.     metoprolol tartrate (LOPRESSOR) 50 MG tablet Take 1.5 tablets (75 mg total) by mouth 2 (two) times daily. 270 tablet 3   Multiple Vitamin (MULTIVITAMIN) tablet Take 1 tablet by mouth daily.     Multiple Vitamins-Minerals (ONE-A-DAY MENS HEALTH FORMULA PO) Take 1 tablet by mouth daily.     omeprazole (PRILOSEC) 20 MG capsule Take 20 mg by mouth daily.     potassium chloride SA (KLOR-CON M20) 20 MEQ tablet Take 1 tablet (20 mEq total) by mouth daily. 90 tablet 2   vitamin C (ASCORBIC ACID) 250 MG tablet Take 250 mg by mouth daily.     No facility-administered medications prior to visit.       Objective:   Physical Exam:  General appearance: 76 y.o., male, NAD, conversant  Eyes: anicteric sclerae; PERRL, tracking appropriately HENT: NCAT; MMM Neck: Trachea midline; no lymphadenopathy, no JVD Lungs: faint exp wheeze bl, with normal respiratory effort CV: RRR, no murmur  Abdomen: Soft, non-tender; non-distended, BS present  Extremities: No peripheral edema, warm Skin: Normal turgor and texture; no rash Psych: Appropriate affect Neuro: Alert and oriented to person and place, no focal deficit     Vitals:    04/06/22 1105  BP: 124/64  Pulse: 64  Temp: 97.7 F (36.5 C)  TempSrc: Oral  SpO2: 97%  Weight: 207 lb (93.9 kg)  Height: 6' 1.5" (1.867 m)   97% on RA BMI Readings from Last 3 Encounters:  04/06/22 26.94 kg/m  02/14/22 26.65 kg/m  01/05/22 26.52 kg/m   Wt Readings from Last 3 Encounters:  04/06/22 207 lb (93.9 kg)  02/14/22 202 lb (91.6 kg)  01/05/22 201 lb (91.2 kg)     CBC  Component Value Date/Time   WBC 4.8 11/17/2020 1450   WBC 5.3 04/19/2020 0928   RBC 4.23 11/17/2020 1450   RBC 4.42 04/19/2020 0928   HGB 13.6 01/05/2022 0708   HGB 13.1 11/17/2020 1450   HCT 40.0 01/05/2022 0708   HCT 38.7 11/17/2020 1450   PLT 184 11/17/2020 1450   MCV 92 11/17/2020 1450   MCH 31.0 11/17/2020 1450   MCH 31.7 04/19/2020 0928   MCHC 33.9 11/17/2020 1450   MCHC 33.2 04/19/2020 0928   RDW 13.7 11/17/2020 1450   LYMPHSABS 1.2 06/25/2018 0701   MONOABS 0.6 06/25/2018 0701   EOSABS 0.1 06/25/2018 0701   BASOSABS 0.0 06/25/2018 0701      Chest Imaging: LDCT Chest 03/2022:   Pulmonary Functions Testing Results:     No data to display             Assessment & Plan:   # 75mm solid, growing LUL pulmonary nodule  # Multiple pulmonary nodules  # Emphysema, bronchial wall thickening   # Asbestos related pleural plaques  # Smoking Precontemplative wrt smoking cessation  Plan: - we will schedule PFTs - PET/CT Super D - START anoro 1 puff once daily  - albuterol 1-2 puffs as needed  - tentatively plan on bronchoscopy to find out what this spot on the lung is, we will be in touch to arrange this - try your best to stop smoking, if you're interested in medicines to support stopping smoking let us know - tentatively plan to see you in clinic in 6 weeks here     Maryjane Hurter, MD [place] [facility] 04/06/2022 11:14 AM

## 2022-04-06 ENCOUNTER — Encounter: Payer: Self-pay | Admitting: Student

## 2022-04-06 ENCOUNTER — Ambulatory Visit (INDEPENDENT_AMBULATORY_CARE_PROVIDER_SITE_OTHER): Payer: No Typology Code available for payment source | Admitting: Student

## 2022-04-06 VITALS — BP 124/64 | HR 64 | Temp 97.7°F | Ht 73.5 in | Wt 207.0 lb

## 2022-04-06 DIAGNOSIS — R911 Solitary pulmonary nodule: Secondary | ICD-10-CM

## 2022-04-06 DIAGNOSIS — J432 Centrilobular emphysema: Secondary | ICD-10-CM | POA: Diagnosis not present

## 2022-04-06 MED ORDER — ANORO ELLIPTA 62.5-25 MCG/ACT IN AEPB: 1.0000 | INHALATION_SPRAY | Freq: Every day | RESPIRATORY_TRACT | 0 refills | Status: DC

## 2022-04-06 MED ORDER — ANORO ELLIPTA 62.5-25 MCG/ACT IN AEPB: 1.0000 | INHALATION_SPRAY | Freq: Every day | RESPIRATORY_TRACT | 11 refills | Status: DC

## 2022-04-06 NOTE — Patient Instructions (Addendum)
-   we will schedule PFTs - we will change order for PET/CT scan and you may end up needing new appointment - START anoro 1 puff once daily  - albuterol 1-2 puffs as needed  - tentatively plan on bronchoscopy to find out what this spot on the lung is, we will be in touch to arrange this - try your best to stop smoking, if you're interested in medicines to support stopping smoking let us know - tentatively plan to see you in clinic in 6 weeks here

## 2022-04-11 ENCOUNTER — Telehealth: Payer: Self-pay

## 2022-04-11 NOTE — Telephone Encounter (Signed)
Patient Advocate Encounter   Received notification from [place] that prior authorization is required for Anoro.  Patient must try and fail, or have a contraindication to [facility] before requesting Anoro. Prior authorization not submitted at this time.  Clista Bernhardt, CPhT Rx Patient Advocate Specialist Phone: [phone]

## 2022-04-12 ENCOUNTER — Ambulatory Visit (HOSPITAL_COMMUNITY)
Admission: RE | Admit: 2022-04-12 | Discharge: 2022-04-12 | Disposition: A | Discharge status: Home / Self Care | Payer: No Typology Code available for payment source | Source: Ambulatory Visit | Attending: Physician Assistant | Admitting: Physician Assistant

## 2022-04-12 ENCOUNTER — Ambulatory Visit (HOSPITAL_COMMUNITY)
Admission: RE | Admit: 2022-04-12 | Discharge: 2022-04-12 | Disposition: A | Discharge status: Home / Self Care | Payer: No Typology Code available for payment source | Source: Ambulatory Visit | Attending: Student | Admitting: Student

## 2022-04-12 DIAGNOSIS — R911 Solitary pulmonary nodule: Secondary | ICD-10-CM | POA: Insufficient documentation

## 2022-04-12 LAB — GLUCOSE, CAPILLARY: Glucose-Capillary: 100 mg/dL — ABNORMAL HIGH (ref 70–99)

## 2022-04-12 MED ORDER — FLUDEOXYGLUCOSE F - 18 (FDG) INJECTION
10.3100 | Freq: Once | INTRAVENOUS | Status: AC
  Administered 2022-04-12: 10.31 via INTRAVENOUS

## 2022-04-14 ENCOUNTER — Telehealth: Payer: Self-pay | Admitting: Student

## 2022-04-14 MED ORDER — STIOLTO RESPIMAT 2.5-2.5 MCG/ACT IN AERS
2.0000 | INHALATION_SPRAY | Freq: Every day | RESPIRATORY_TRACT | 11 refills | Status: DC
Start: 2022-04-14 — End: 2024-02-02

## 2022-04-14 NOTE — Telephone Encounter (Signed)
Called to discuss PET scan results and informed that prescription for stiolto sent to [facility]. I will work on clarifying time that I can schedule bronchoscopy together with [facility] or [name].

## 2022-04-14 NOTE — Telephone Encounter (Signed)
error 

## 2022-04-14 NOTE — Addendum Note (Signed)
Addended by: Maryjane Hurter on: 04/14/2022 03:32 PM   Modules accepted: Orders

## 2022-04-17 ENCOUNTER — Telehealth: Payer: Self-pay | Admitting: Student

## 2022-04-17 NOTE — Telephone Encounter (Signed)
Please schedule the following - I have discussed case with Dr. Lamonte Ferrell and confirmed ok to schedule this coming Monday 04/24/22:  Provider performing procedure:Samuel Ferrell, Samuel Ferrell  Diagnosis: Pulmonary nodule Which side if for nodule / mass? Left Procedure: Robotic bronchoscopy, EBUS  Has patient been spoken to by Provider and given informed consent? yes Anesthesia: general Do you need Fluro? yes Duration of procedure: 1.5hr Date: 04/24/22 Alternate Date:   Time: any  Location: [facility] Does patient have OSA? no DM? no Or Latex allergy? no Medication Restriction: none Anticoagulate/Antiplatelet: needs to stop plavix 5 days before procedure but he should remain on aspirin due to his recent renal artery stents. Per vascular he should only be taking aspirin anyway, does not need plavix anymore.  Pre-op Labs Ordered:determined by Anesthesia Imaging request: Already has super D CT completed  (If, SuperDimension CT Chest, please have STAT courier sent to ENDO)  Please coordinate Pre-op COVID Testing

## 2022-04-18 ENCOUNTER — Encounter: Payer: Self-pay | Admitting: Student

## 2022-04-18 NOTE — Telephone Encounter (Signed)
Called and confirmed plan for blood thinners. He will stop taking plavix now. Start taking aspirin 81 mg daily tomorrow and he will continue without interruption. He will stop taking eliquis on this coming Saturday and Sunday.

## 2022-04-18 NOTE — Telephone Encounter (Addendum)
Pt has been scheduled for 8/14 at 9:00 and will go for covid test on 8/11.  Case # [id number].  I spoke to pt & gave him appt info and will send him out a letter.  Dr Verlee Monte he is confused about his meds.  Per your instructions he is to stop Plavix 5 days prior but to continue aspirin.  You also state per vascular he should already be off Plavix.  He states he has not stopped Plavix and he has not been taking aspirin.  I instructed he may need to call his vascular doctor but he states he gets run around and would like for you to clarify for him.  Please call pt with meds instructions.  [facility] Auth on file # [id number] from 03/29/22-09/25/22

## 2022-04-18 NOTE — Telephone Encounter (Signed)
Agree with the plan.

## 2022-04-21 ENCOUNTER — Other Ambulatory Visit: Payer: Self-pay

## 2022-04-21 ENCOUNTER — Encounter (HOSPITAL_COMMUNITY): Payer: Self-pay | Admitting: Student

## 2022-04-21 ENCOUNTER — Other Ambulatory Visit: Payer: Self-pay | Admitting: Student

## 2022-04-21 DIAGNOSIS — R911 Solitary pulmonary nodule: Secondary | ICD-10-CM

## 2022-04-21 LAB — SARS CORONAVIRUS 2 (TAT 6-24 HRS): SARS Coronavirus 2: NEGATIVE

## 2022-04-21 NOTE — Anesthesia Preprocedure Evaluation (Addendum)
Anesthesia Evaluation  Patient identified by MRN, date of birth, ID band Patient awake    Reviewed: Allergy & Precautions, NPO status , Patient's Chart, lab work & pertinent test results, reviewed documented beta blocker date and time   History of Anesthesia Complications Negative for: history of anesthetic complications  Airway Mallampati: II  TM Distance: >3 FB Neck ROM: Full    Dental  (+) Dental Advisory Given, Poor Dentition, Chipped,    Pulmonary COPD,  COPD inhaler, Current Smoker and Patient abstained from smoking.,    Pulmonary exam normal        Cardiovascular hypertension, Pt. on home beta blockers and Pt. on medications + Peripheral Vascular Disease  Normal cardiovascular exam+ dysrhythmias Atrial Fibrillation      Neuro/Psych TIAnegative psych ROS   GI/Hepatic GERD  Medicated and Controlled,(+)     substance abuse  alcohol use,   Endo/Other  negative endocrine ROS  Renal/GU negative Renal ROS     Musculoskeletal  (+) Arthritis ,   Abdominal   Peds  Hematology  On eliquis      Anesthesia Other Findings   Reproductive/Obstetrics                          Anesthesia Physical Anesthesia Plan  ASA: 3  Anesthesia Plan: General   Post-op Pain Management: Minimal or no pain anticipated   Induction: Intravenous  PONV Risk Score and Plan: 1 and Treatment may vary due to age or medical condition, Ondansetron and Dexamethasone  Airway Management Planned: Oral ETT  Additional Equipment: None  Intra-op Plan:   Post-operative Plan: Extubation in OR  Informed Consent: I have reviewed the patients History and Physical, chart, labs and discussed the procedure including the risks, benefits and alternatives for the proposed anesthesia with the patient or authorized representative who has indicated his/her understanding and acceptance.     Dental advisory given  Plan Discussed  with: CRNA and Anesthesiologist  Anesthesia Plan Comments:       Anesthesia Quick Evaluation

## 2022-04-21 NOTE — Progress Notes (Signed)
Anesthesia Chart Review: Same day workup  Follows with cardiology at the [facility] for history of chronic A-fib/flutter (ablation [facility] 2007, 2009), TIA, HTN.  He was last seen 02/28/2022 for expedited follow-up as he had just been seen at the [facility] preanesthesia clinic and noted to be in rapid A-fib.  He was being evaluated for right flank mass removal.  Per cardiology note, A-fib with RVR rates in the 120s to 140s, patient did not take his metoprolol and dofetilide that morning.  Documentation states it is relatively reliable that he has recurrent A-fib when he misses his medications.  Per note, "1. Atrial fibrillation with RVR: Recurrent A. fib with elevated heart rate and  noted on EKG today, heart rate in the preanesthesia clinic was in the 120s, 140 in our clinic. He is generally asymptomatic, mild palpitations only. He denies chest pain, shortness of breath, dizziness, syncope, presyncope. --Likely attributable to missing his medications this morning. He is can return home, take dofetilide and metoprolol. He knows to take an extra dose of metoprolol should he continue to have atrial fibrillation or elevated heart rate.--He can continue surgical planning. He was advised that surgery, anesthesia will likely want to see a an updated EKG showing restoration of normal sinus rhythm, or at least improved heart rate control prior to his upcoming surgery. I scheduled him for a nurse visit next week for updated EKG. --If he has continued atrial fibrillation with or without RVR, we can discuss scheduling for cardioversion procedure.--Continue apixaban: He has been cleared to hold apixaban for 5 days prior to upcoming procedure."  Repeat EKG 03/07/2022 showed sinus rhythm with first-degree block, rate 64.  He did subsequently undergo removal of right flank lipoma.  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  Last seen by Dr. Carlis Abbott 02/14/2022,  doing well, Plavix was discontinued at that time.  Current smoker with history of emphysema, asbestos exposure.  Patient recently noted to have growing 9 mm left upper lobe nodule, multiple smaller nodules.  Endoscopy recommended for further evaluation.  Patient will need day of surgery labs and evaluation.  PET scan 04/12/2022: IMPRESSION: 1. Hypermetabolic left upper lobe pulmonary nodule, suspicious for primary bronchogenic carcinoma. No other hypermetabolic nodules or evidence of metastatic disease. 2. Indeterminate density left adrenal nodule is unchanged in size from 09/15/2021 CT and shows no significant hypermetabolic activity, likely an incidental adenoma. Attention on follow-up recommended. 3. See separate chest CT report for additional incidental thoracic findings. 4. Hypermetabolic left maxillary molar periodontal disease. Correlate clinically.  Nuclear stress 07/16/2018: Nuclear stress EF: 64%. There was no ST segment deviation noted during stress. No T wave inversion was noted during stress. The study is normal. This is a low risk study. The left ventricular ejection fraction is normal (55-65%).  TTE 06/25/2018: - Left ventricle: The cavity size was normal. Wall thickness was    normal. Systolic function was normal. The estimated ejection    fraction was in the range of 55% to 60%. Wall motion was normal;    there were no regional wall motion abnormalities. Doppler    parameters are consistent with abnormal left ventricular    relaxation (grade 1 diastolic dysfunction).  - Mitral valve: Mild focal calcification of the anterior leaflet.      [name] [facility] Short Stay Center/Anesthesiology Phone [phone] 04/21/2022 11:27 AM

## 2022-04-21 NOTE — Progress Notes (Signed)
Spoke with Samuel Ferrell for pre-op call. Samuel Ferrell has hx of A-fib and A-flutter. He sees a cardiologist at the [place]. He states the A-fib is under control. Samuel Ferrell is on Eliquis and last dose is today, 04/21/22. Samuel Ferrell has been on Plavix since have a stent put in his renal artery. His last dose was yesterday 04/20/22 and he started an 81 mg Aspirin today. He states he will not be taking Plavix anymore per Dr. Carlis Abbott.   Shower instructions given to Samuel Ferrell and he voiced understanding.

## 2022-04-24 ENCOUNTER — Encounter (HOSPITAL_COMMUNITY): Payer: Self-pay | Admitting: Student

## 2022-04-24 ENCOUNTER — Ambulatory Visit (HOSPITAL_COMMUNITY)
Admission: RE | Admit: 2022-04-24 | Discharge: 2022-04-24 | Disposition: A | Discharge status: Home / Self Care | Payer: No Typology Code available for payment source | Source: Ambulatory Visit | Attending: Student | Admitting: Student

## 2022-04-24 ENCOUNTER — Ambulatory Visit (HOSPITAL_COMMUNITY): Payer: No Typology Code available for payment source

## 2022-04-24 ENCOUNTER — Ambulatory Visit (HOSPITAL_COMMUNITY): Payer: No Typology Code available for payment source | Admitting: Physician Assistant

## 2022-04-24 ENCOUNTER — Encounter (HOSPITAL_COMMUNITY)
Admission: RE | Disposition: A | Discharge status: Home / Self Care | Payer: Self-pay | Source: Ambulatory Visit | Attending: Student

## 2022-04-24 ENCOUNTER — Ambulatory Visit (HOSPITAL_BASED_OUTPATIENT_CLINIC_OR_DEPARTMENT_OTHER): Payer: No Typology Code available for payment source | Admitting: Physician Assistant

## 2022-04-24 DIAGNOSIS — J439 Emphysema, unspecified: Secondary | ICD-10-CM | POA: Insufficient documentation

## 2022-04-24 DIAGNOSIS — Z801 Family history of malignant neoplasm of trachea, bronchus and lung: Secondary | ICD-10-CM | POA: Insufficient documentation

## 2022-04-24 DIAGNOSIS — Z7901 Long term (current) use of anticoagulants: Secondary | ICD-10-CM | POA: Insufficient documentation

## 2022-04-24 DIAGNOSIS — F17210 Nicotine dependence, cigarettes, uncomplicated: Secondary | ICD-10-CM | POA: Diagnosis not present

## 2022-04-24 DIAGNOSIS — I10 Essential (primary) hypertension: Secondary | ICD-10-CM | POA: Diagnosis not present

## 2022-04-24 DIAGNOSIS — Z77090 Contact with and (suspected) exposure to asbestos: Secondary | ICD-10-CM | POA: Diagnosis not present

## 2022-04-24 DIAGNOSIS — Z8673 Personal history of transient ischemic attack (TIA), and cerebral infarction without residual deficits: Secondary | ICD-10-CM | POA: Insufficient documentation

## 2022-04-24 DIAGNOSIS — R911 Solitary pulmonary nodule: Secondary | ICD-10-CM | POA: Diagnosis present

## 2022-04-24 DIAGNOSIS — M1990 Unspecified osteoarthritis, unspecified site: Secondary | ICD-10-CM

## 2022-04-24 DIAGNOSIS — I739 Peripheral vascular disease, unspecified: Secondary | ICD-10-CM | POA: Diagnosis not present

## 2022-04-24 DIAGNOSIS — I4891 Unspecified atrial fibrillation: Secondary | ICD-10-CM | POA: Diagnosis not present

## 2022-04-24 HISTORY — PX: BRONCHIAL WASHINGS: SHX5105

## 2022-04-24 HISTORY — PX: BRONCHIAL BRUSHINGS: SHX5108

## 2022-04-24 HISTORY — PX: HEMOSTASIS CONTROL: SHX6838

## 2022-04-24 HISTORY — DX: Gastro-esophageal reflux disease without esophagitis: K21.9

## 2022-04-24 HISTORY — DX: COVID-19: U07.1

## 2022-04-24 HISTORY — PX: BRONCHIAL BIOPSY: SHX5109

## 2022-04-24 HISTORY — DX: Anemia, unspecified: D64.9

## 2022-04-24 HISTORY — PX: VIDEO BRONCHOSCOPY WITH RADIAL ENDOBRONCHIAL ULTRASOUND: SHX6849

## 2022-04-24 HISTORY — PX: BRONCHIAL NEEDLE ASPIRATION BIOPSY: SHX5106

## 2022-04-24 HISTORY — DX: Pneumonia, unspecified organism: J18.9

## 2022-04-24 LAB — BASIC METABOLIC PANEL
Anion gap: 8 (ref 5–15)
BUN: 20 mg/dL (ref 8–23)
CO2: 21 mmol/L — ABNORMAL LOW (ref 22–32)
Calcium: 8.4 mg/dL — ABNORMAL LOW (ref 8.9–10.3)
Chloride: 112 mmol/L — ABNORMAL HIGH (ref 98–111)
Creatinine, Ser: 1.35 mg/dL — ABNORMAL HIGH (ref 0.61–1.24)
GFR, Estimated: 55 mL/min — ABNORMAL LOW (ref >60)
Glucose, Bld: 103 mg/dL — ABNORMAL HIGH (ref 70–99)
Potassium: 4 mmol/L (ref 3.5–5.1)
Sodium: 141 mmol/L (ref 135–145)

## 2022-04-24 LAB — CBC
HCT: 39.1 % (ref 39.0–52.0)
Hemoglobin: 13.3 g/dL (ref 13.0–17.0)
MCH: 32.4 pg (ref 26.0–34.0)
MCHC: 34 g/dL (ref 30.0–36.0)
MCV: 95.1 fL (ref 80.0–100.0)
Platelets: 153 10*3/uL (ref 150–400)
RBC: 4.11 MIL/uL — ABNORMAL LOW (ref 4.22–5.81)
RDW: 13.5 % (ref 11.5–15.5)
WBC: 5.2 10*3/uL (ref 4.0–10.5)
nRBC: 0 % (ref 0.0–0.2)

## 2022-04-24 SURGERY — BRONCHOSCOPY, WITH BIOPSY USING ELECTROMAGNETIC NAVIGATION
Anesthesia: General

## 2022-04-24 MED ORDER — ROCURONIUM BROMIDE 10 MG/ML (PF) SYRINGE
PREFILLED_SYRINGE | INTRAVENOUS | Status: DC | PRN
  Administered 2022-04-24: 50 mg via INTRAVENOUS
  Administered 2022-04-24 (×2): 20 mg via INTRAVENOUS

## 2022-04-24 MED ORDER — PHENYLEPHRINE 80 MCG/ML (10ML) SYRINGE FOR IV PUSH (FOR BLOOD PRESSURE SUPPORT)
PREFILLED_SYRINGE | INTRAVENOUS | Status: DC | PRN
  Administered 2022-04-24: 160 ug via INTRAVENOUS
  Administered 2022-04-24 (×3): 80 ug via INTRAVENOUS
  Administered 2022-04-24: 160 ug via INTRAVENOUS
  Administered 2022-04-24: 80 ug via INTRAVENOUS

## 2022-04-24 MED ORDER — EPINEPHRINE 1 MG/10ML IJ SOSY
PREFILLED_SYRINGE | INTRAMUSCULAR | Status: AC
  Filled 2022-04-24: qty 10

## 2022-04-24 MED ORDER — LIDOCAINE 2% (20 MG/ML) 5 ML SYRINGE
INTRAMUSCULAR | Status: DC | PRN
  Administered 2022-04-24: 60 mg via INTRAVENOUS

## 2022-04-24 MED ORDER — FENTANYL CITRATE (PF) 100 MCG/2ML IJ SOLN: 25.0000 ug | INTRAMUSCULAR | Status: DC | PRN

## 2022-04-24 MED ORDER — OXYCODONE HCL 5 MG PO TABS: 5.0000 mg | ORAL_TABLET | Freq: Once | ORAL | Status: DC | PRN

## 2022-04-24 MED ORDER — OXYCODONE HCL 5 MG/5ML PO SOLN: 5.0000 mg | Freq: Once | ORAL | Status: DC | PRN

## 2022-04-24 MED ORDER — CHLORHEXIDINE GLUCONATE 0.12 % MT SOLN
15.0000 mL | OROMUCOSAL | Status: AC
  Administered 2022-04-24: 15 mL via OROMUCOSAL
  Filled 2022-04-24 (×2): qty 15

## 2022-04-24 MED ORDER — FENTANYL CITRATE (PF) 250 MCG/5ML IJ SOLN
INTRAMUSCULAR | Status: DC | PRN
  Administered 2022-04-24 (×2): 50 ug via INTRAVENOUS

## 2022-04-24 MED ORDER — ONDANSETRON HCL 4 MG/2ML IJ SOLN: 4.0000 mg | Freq: Once | INTRAMUSCULAR | Status: DC | PRN

## 2022-04-24 MED ORDER — LACTATED RINGERS IV SOLN: INTRAVENOUS | Status: DC

## 2022-04-24 MED ORDER — PROPOFOL 10 MG/ML IV BOLUS
INTRAVENOUS | Status: DC | PRN
  Administered 2022-04-24: 170 mg via INTRAVENOUS
  Administered 2022-04-24: 30 mg via INTRAVENOUS

## 2022-04-24 MED ORDER — SUGAMMADEX SODIUM 200 MG/2ML IV SOLN
INTRAVENOUS | Status: DC | PRN
  Administered 2022-04-24: 200 mg via INTRAVENOUS

## 2022-04-24 MED ORDER — DEXAMETHASONE SODIUM PHOSPHATE 10 MG/ML IJ SOLN
INTRAMUSCULAR | Status: DC | PRN
  Administered 2022-04-24: 4 mg via INTRAVENOUS

## 2022-04-24 MED ORDER — EPINEPHRINE 1 MG/10ML IJ SOSY
PREFILLED_SYRINGE | INTRAMUSCULAR | Status: DC | PRN
  Administered 2022-04-24: .8 mg

## 2022-04-24 MED ORDER — ONDANSETRON HCL 4 MG/2ML IJ SOLN
INTRAMUSCULAR | Status: DC | PRN
  Administered 2022-04-24: 4 mg via INTRAVENOUS

## 2022-04-24 NOTE — Discharge Instructions (Signed)
-   Wait to resume aspirin until tomorrow afternoon - Wait to resume eliquis until day after tomorrow - Will call with results or if you have questions about them before you hear from me then call [phone] or send my chart message

## 2022-04-24 NOTE — Addendum Note (Signed)
Addended by: Dessie Coma on: 04/24/2022 02:34 PM   Modules accepted: Orders

## 2022-04-24 NOTE — Progress Notes (Signed)
Per [facility] Pathology the new order needed to be placed .

## 2022-04-24 NOTE — Anesthesia Procedure Notes (Signed)
Procedure Name: Intubation Date/Time: 04/24/2022 7:37 AM  Performed by: Jenne Campus, CRNAPre-anesthesia Checklist: Patient identified, Emergency Drugs available, Suction available and Patient being monitored Patient Re-evaluated:Patient Re-evaluated prior to induction Oxygen Delivery Method: Circle System Utilized Preoxygenation: Pre-oxygenation with 100% oxygen Induction Type: IV induction Ventilation: Mask ventilation without difficulty Laryngoscope Size: Miller and 3 Grade View: Grade I Tube type: Oral Tube size: 8.5 mm Number of attempts: 1 Airway Equipment and Method: Stylet and Oral airway Placement Confirmation: ETT inserted through vocal cords under direct vision, positive ETCO2 and breath sounds checked- equal and bilateral Secured at: 23 cm Tube secured with: Tape Dental Injury: Teeth and Oropharynx as per pre-operative assessment

## 2022-04-24 NOTE — Anesthesia Postprocedure Evaluation (Signed)
Anesthesia Post Note  Patient: Samuel Ferrell  Procedure(s) Performed: ROBOTIC ASSISTED NAVIGATIONAL BRONCHOSCOPY BRONCHIAL BIOPSIES BRONCHIAL NEEDLE ASPIRATION BIOPSIES BRONCHIAL BRUSHINGS VIDEO BRONCHOSCOPY WITH RADIAL ENDOBRONCHIAL ULTRASOUND BRONCHIAL WASHINGS HEMOSTASIS CONTROL     Patient location during evaluation: PACU Anesthesia Type: General Level of consciousness: awake and alert Pain management: pain level controlled Vital Signs Assessment: post-procedure vital signs reviewed and stable Respiratory status: spontaneous breathing, nonlabored ventilation, respiratory function stable and patient connected to nasal cannula oxygen Cardiovascular status: blood pressure returned to baseline and stable Postop Assessment: no apparent nausea or vomiting Anesthetic complications: no   No notable events documented.  Last Vitals:  Vitals:   04/24/22 0930 04/24/22 0945  BP: 139/86 (!) 118/50  Pulse: 76 75  Resp: (!) 21 17  Temp: 36.8 C   SpO2: 94% 93%    Last Pain:  Vitals:   04/24/22 0930  TempSrc:   PainSc: 0-No pain                 Audry Pili

## 2022-04-24 NOTE — Transfer of Care (Signed)
Immediate Anesthesia Transfer of Care Note  Patient: Samuel Ferrell  Procedure(s) Performed: ROBOTIC ASSISTED NAVIGATIONAL BRONCHOSCOPY BRONCHIAL BIOPSIES BRONCHIAL NEEDLE ASPIRATION BIOPSIES BRONCHIAL BRUSHINGS VIDEO BRONCHOSCOPY WITH RADIAL ENDOBRONCHIAL ULTRASOUND BRONCHIAL WASHINGS HEMOSTASIS CONTROL  Patient Location: PACU  Anesthesia Type:General  Level of Consciousness: awake, oriented and patient cooperative  Airway & Oxygen Therapy: Patient Spontanous Breathing and Patient connected to nasal cannula oxygen  Post-op Assessment: Report given to RN and Post -op Vital signs reviewed and stable  Post vital signs: Reviewed  Last Vitals:  Vitals Value Taken Time  BP 139/86 04/24/22 0930  Temp 36.8 C 04/24/22 0930  Pulse 76 04/24/22 0932  Resp 14 04/24/22 0932  SpO2 94 % 04/24/22 0932  Vitals shown include unvalidated device data.  Last Pain:  Vitals:   04/24/22 0646  TempSrc: Oral  PainSc:       Patients Stated Pain Goal: 0 (57/50/51 8335)  Complications: No notable events documented.

## 2022-04-24 NOTE — Interval H&P Note (Signed)
History and Physical Interval Note:  04/24/2022 7:16 AM  Samuel Ferrell  has presented today for surgery, with the diagnosis of pulmonary nodule.  The various methods of treatment have been discussed with the patient and family. After consideration of risks, benefits and other options for treatment, the patient has consented to  Procedure(s): ROBOTIC ASSISTED NAVIGATIONAL BRONCHOSCOPY (N/A) VIDEO BRONCHOSCOPY WITH ENDOBRONCHIAL ULTRASOUND (N/A) as a surgical intervention.  The patient's history has been reviewed, patient examined, no change in status, stable for surgery.  I have reviewed the patient's chart and labs.  Questions were answered to the patient's satisfaction.     Maryjane Hurter

## 2022-04-24 NOTE — Op Note (Signed)
Video Bronchoscopy with Robotic Assisted Bronchoscopic Navigation   Date of Operation: 04/24/2022   Pre-op Diagnosis: Lingula pulmonary nodule  Post-op Diagnosis: Lingula pulmonary nodule  Surgeon: Walker Shadow  Assistants: Londell Moh  Anesthesia: General endotracheal anesthesia  Operation: Flexible video fiberoptic bronchoscopy with robotic assistance and biopsies.  Estimated Blood Loss: Minimal  Complications: None  Indications and History: Samuel Ferrell is a 76 y.o. male with history of smoking, suspected COPD, and growing pulmonary nodule in lingula. The risks, benefits, complications, treatment options and expected outcomes were discussed with the patient.  The possibilities of pneumothorax, pneumonia, reaction to medication, pulmonary aspiration, perforation of a viscus, bleeding, failure to diagnose a condition and creating a complication requiring transfusion or operation were discussed with the patient who freely signed the consent.    Description of Procedure: The patient was seen in the Preoperative Area, was examined and was deemed appropriate to proceed.  The patient was taken to [facility] endoscopy room 1, identified as Samuel Ferrell and the procedure verified as Flexible Video Fiberoptic Bronchoscopy.  A Time Out was held and the above information confirmed.   Prior to the date of the procedure a high-resolution CT scan of the chest was performed. Utilizing ION software program a virtual tracheobronchial tree was generated to allow the creation of distinct navigation pathways to the patient's parenchymal abnormalities. After being taken to the operating room general anesthesia was initiated and the patient  was orally intubated. The video fiberoptic bronchoscope was introduced via the endotracheal tube and a general inspection was performed which showed normal right and left lung anatomy, aspiration of the bilateral mainstems was completed to remove any remaining secretions. Robotic  catheter inserted into patient's endotracheal tube.   Target #1 Lingula pulmonary nodule: The distinct navigation pathways prepared prior to this procedure were then utilized to navigate to patient's lesion identified on CT scan. CIOS imaging was used to aid navigation and confirm ideal location for biopsy as below. The robotic catheter was secured into place and the vision probe was withdrawn.  Lesion location was approximated using fluoroscopy and radial endobronchial ultrasound for peripheral targeting. Under fluoroscopic guidance transbronchial needle brushings, transbronchial needle biopsies, and transbronchial forceps biopsies were performed to be sent for cytology and pathology. A bronchoalveolar lavage was performed in the lingula and sent for cytology.  At the end of the procedure a general airway inspection was performed and there was no evidence of active bleeding. The bronchoscope was removed.  The patient tolerated the procedure well. There was no significant blood loss and there were no obvious complications. A post-procedural chest x-ray is pending.    Samples Target #1: 1. Transbronchial needle brushings from lingula pulmonary nodule 2. Transbronchial Wang needle biopsies from lingula pulmonary nodule 3. Transbronchial forceps biopsies from lingula pulmonary nodule 4. Bronchoalveolar lavage from lingula pulmonary nodule 5. Endobronchial biopsies from lingula pulmonary nodule   Plans:  The patient will be discharged from the PACU to home when recovered from anesthesia and after chest x-ray is reviewed. We will review the cytology, pathology and microbiology results with the patient when they become available. Outpatient followup will be with me on 05/18/22, Dr. Roxan Hockey 05/30/22.

## 2022-04-25 ENCOUNTER — Telehealth: Payer: Self-pay | Admitting: Student

## 2022-04-25 LAB — CYTOLOGY - NON PAP

## 2022-04-25 NOTE — Telephone Encounter (Signed)
Called to discuss results. Despite our best effort we didn't get an answer. Would still recommend he proceed with evaluation by [facility] for resection. Has PFTs 8/17. Will see if he can be seen in Dr. Leonarda Salon clinic any sooner than 9/19. Has a little bloody cough, improving, to restart aspirin today and eliquis tomorrow.   [place]

## 2022-04-26 ENCOUNTER — Encounter (HOSPITAL_COMMUNITY): Payer: Self-pay | Admitting: Student

## 2022-04-27 ENCOUNTER — Ambulatory Visit (INDEPENDENT_AMBULATORY_CARE_PROVIDER_SITE_OTHER): Payer: No Typology Code available for payment source | Admitting: Student

## 2022-04-27 DIAGNOSIS — J432 Centrilobular emphysema: Secondary | ICD-10-CM

## 2022-04-27 LAB — PULMONARY FUNCTION TEST
DL/VA % pred: 76 %
DL/VA: 3.02 ml/min/mmHg/L
DLCO cor % pred: 73 %
DLCO cor: 20.41 ml/min/mmHg
DLCO unc % pred: 70 %
DLCO unc: 19.62 ml/min/mmHg
FEF 25-75 Post: 1.53 L/sec
FEF 25-75 Pre: 1.2 L/sec
FEF2575-%Change-Post: 27 %
FEF2575-%Pred-Post: 59 %
FEF2575-%Pred-Pre: 46 %
FEV1-%Change-Post: 6 %
FEV1-%Pred-Post: 64 %
FEV1-%Pred-Pre: 60 %
FEV1-Post: 2.26 L
FEV1-Pre: 2.13 L
FEV1FVC-%Change-Post: 0 %
FEV1FVC-%Pred-Pre: 90 %
FEV6-%Change-Post: 8 %
FEV6-%Pred-Post: 74 %
FEV6-%Pred-Pre: 68 %
FEV6-Post: 3.35 L
FEV6-Pre: 3.07 L
FEV6FVC-%Change-Post: 1 %
FEV6FVC-%Pred-Post: 104 %
FEV6FVC-%Pred-Pre: 103 %
FVC-%Change-Post: 5 %
FVC-%Pred-Post: 70 %
FVC-%Pred-Pre: 67 %
FVC-Post: 3.4 L
FVC-Pre: 3.22 L
Post FEV1/FVC ratio: 66 %
Post FEV6/FVC ratio: 99 %
Pre FEV1/FVC ratio: 66 %
Pre FEV6/FVC Ratio: 97 %
RV % pred: 178 %
RV: 4.83 L
TLC % pred: 108 %
TLC: 8.29 L

## 2022-04-27 NOTE — Progress Notes (Signed)
Full PFT Performed Today  

## 2022-04-27 NOTE — Patient Instructions (Signed)
Full PFT Performed Today  

## 2022-05-03 ENCOUNTER — Institutional Professional Consult (permissible substitution) (INDEPENDENT_AMBULATORY_CARE_PROVIDER_SITE_OTHER): Payer: No Typology Code available for payment source | Admitting: Thoracic Surgery (Cardiothoracic Vascular Surgery)

## 2022-05-03 ENCOUNTER — Encounter: Payer: Self-pay | Admitting: Thoracic Surgery (Cardiothoracic Vascular Surgery)

## 2022-05-03 VITALS — BP 148/68 | HR 80 | Resp 20 | Ht 73.5 in | Wt 201.0 lb

## 2022-05-03 DIAGNOSIS — R911 Solitary pulmonary nodule: Secondary | ICD-10-CM

## 2022-05-03 NOTE — Progress Notes (Signed)
PCP is Tisovec, Fransico Him, MD Referring Provider is Tisovec, Fransico Him, MD  Chief Complaint  Patient presents with   Lung Lesion    Surgical consult, Chest CT 03/28/22/PET Scan 04/12/22/ PFT's 04/27/22/Bronch 04/24/22     HPI: Samuel Ferrell is for consultation regarding a left upper lobe lung nodule.  Samuel Ferrell is a 76 year old gentleman with a past history significant for tobacco abuse, COPD, hypertension, hyperlipidemia, atrial fibrillation with 2 prior ablations, atypical atrial flutter, TIA, anemia, reflux, melanoma, and ethanol abuse (quit 5 years ago).  He had a low-dose screening CT for lung cancer back in January which showed a left upper lobe nodule.  A follow-up scan about 4 months later showed the nodule had increased in size by 2 mm.  He then had a PET/CT which shows the nodule is hypermetabolic.  He was referred to Dr. Verlee Monte.  Navigational bronchoscopy was nondiagnostic.  He smoked about a pack of cigarettes daily for 60 years.  He says he is now a little bit less than that but has not tried to quit altogether.  He complains of a cough since he started his inhaler but did not have one prior to that.  He says he will feel an episode of atrial fibrillation about once a month but only every 2 or 3 months with a significant.  He denies any chest pain, pressure, or tightness.  He does complain of pain in the calves when he walks.  More limited by that.  Shortness of breath only with heavy exertion.  Zubrod Score: At the time of surgery this patient's most appropriate activity status/level should be described as: []     0    Normal activity, no symptoms [x]     1    Restricted in physical strenuous activity but ambulatory, able to do out light work []     2    Ambulatory and capable of self care, unable to do work activities, up and about >50 % of waking hours                              []     3    Only limited self care, in bed greater than 50% of waking hours []     4    Completely disabled, no self  care, confined to bed or chair []     5    Moribund  Past Medical History:  Diagnosis Date   Afib [facility])    s/p ablation x2 by Dr [name] at [facility] in 2007, 2009   Alcohol abuse    Anemia    Atypical atrial flutter ([place])    COPD (chronic obstructive pulmonary disease) ([place])    COVID    mild case   CVA (cerebral infarction)    GERD (gastroesophageal reflux disease)    Hyperlipidemia    Hypertension    Palpitations    Pneumonia    1970's   Skin cancer--melanoma    Stroke ([place])    TIA    Past Surgical History:  Procedure Laterality Date   ANTERIOR CERVICAL DECOMP/DISCECTOMY FUSION N/A 07/24/2018   Procedure: ANTERIOR CERVICAL DECOMPRESSION/DISCECTOMY FUSION, INTERBODY PROSTHESIS, PLATE SCREWS CERVICAL FIVE- CERVICAL SIX;  Surgeon: Newman Pies, MD;  Location: [place];  Service: Neurosurgery;  Laterality: N/A;  ANTERIOR CERVICAL DECOMPRESSION/DISCECTOMY FUSION, INTERBODY PROSTHESIS, PLATE SCREWS CERVICAL FIVE- CERVICAL SIX   APPENDECTOMY     ATRIAL FIBRILLATION ABLATION  2007 ,2009   Dr [name] at  [facility]   BRONCHIAL BIOPSY  04/24/2022   Procedure: BRONCHIAL BIOPSIES;  Surgeon: Maryjane Hurter, MD;  Location: [place];  Service: Pulmonary;;   BRONCHIAL BRUSHINGS  04/24/2022   Procedure: BRONCHIAL BRUSHINGS;  Surgeon: Maryjane Hurter, MD;  Location: [place];  Service: Pulmonary;;   BRONCHIAL NEEDLE ASPIRATION BIOPSY  04/24/2022   Procedure: BRONCHIAL NEEDLE ASPIRATION BIOPSIES;  Surgeon: Maryjane Hurter, MD;  Location: [place];  Service: Pulmonary;;   BRONCHIAL WASHINGS  04/24/2022   Procedure: BRONCHIAL WASHINGS;  Surgeon: Maryjane Hurter, MD;  Location: [place];  Service: Pulmonary;;   FRACTURE SURGERY     right clavicle   HEMOSTASIS CONTROL  04/24/2022   Procedure: HEMOSTASIS CONTROL;  Surgeon: Maryjane Hurter, MD;  Location: [facility] ENDOSCOPY;  Service: Pulmonary;;   PERIPHERAL VASCULAR INTERVENTION Bilateral 01/05/2022   Procedure: PERIPHERAL  VASCULAR INTERVENTION;  Surgeon: Marty Heck, MD;  Location: [place] CV LAB;  Service: Cardiovascular;  Laterality: Bilateral;  Bilateral Renal Artery Stents   RENAL ANGIOGRAPHY Bilateral 01/05/2022   Procedure: RENAL ARTERY STENTING;  Surgeon: Marty Heck, MD;  Location: [place] CV LAB;  Service: Cardiovascular;  Laterality: Bilateral;   VIDEO BRONCHOSCOPY WITH RADIAL ENDOBRONCHIAL ULTRASOUND  04/24/2022   Procedure: VIDEO BRONCHOSCOPY WITH RADIAL ENDOBRONCHIAL ULTRASOUND;  Surgeon: Maryjane Hurter, MD;  Location: [facility] ENDOSCOPY;  Service: Pulmonary;;    Family History  Problem Relation Age of Onset   Lung cancer Brother        smoked   Cancer Other    Colon cancer Neg Hx    Esophageal cancer Neg Hx    Rectal cancer Neg Hx    Stomach cancer Neg Hx     Social History Social History   Tobacco Use   Smoking status: Every Day    Packs/day: 1.00    Years: 61.00    Total pack years: 61.00    Types: Cigarettes   Smokeless tobacco: Never  Vaping Use   Vaping Use: Never used  Substance Use Topics   Alcohol use: Not Currently    Comment: heavy, up to 6 beers per day   Drug use: No    Current Outpatient Medications  Medication Sig Dispense Refill   apixaban (ELIQUIS) 5 MG TABS tablet Take 5 mg by mouth 2 (two) times daily.     aspirin EC 81 MG tablet Take 81 mg by mouth daily. Swallow whole.     atorvastatin (LIPITOR) 40 MG tablet Take 40 mg by mouth every evening.      dofetilide (TIKOSYN) 500 MCG capsule Take 1 capsule (500 mcg total) by mouth 2 (two) times daily. 180 capsule 2   enalapril (VASOTEC) 10 MG tablet Take 1 tablet (10 mg total) by mouth at bedtime. 90 tablet 3   ferrous sulfate 325 (65 FE) MG tablet Take 325 mg by mouth daily with breakfast.     finasteride (PROSCAR) 5 MG tablet Take 5 mg by mouth daily.     Magnesium 250 MG TABS Take 250 mg by mouth daily.     metoprolol tartrate (LOPRESSOR) 50 MG tablet Take 1.5 tablets (75 mg total) by  mouth 2 (two) times daily. 270 tablet 3   Multiple Vitamin (MULTIVITAMIN) tablet Take 1 tablet by mouth daily.     Multiple Vitamins-Minerals (ONE-A-DAY MENS HEALTH FORMULA PO) Take 1 tablet by mouth daily.     omeprazole (PRILOSEC) 20 MG capsule Take 20 mg by mouth daily.     potassium chloride SA (KLOR-CON  M20) 20 MEQ tablet Take 1 tablet (20 mEq total) by mouth daily. 90 tablet 2   Tiotropium Bromide-Olodaterol (STIOLTO RESPIMAT) 2.5-2.5 MCG/ACT AERS Inhale 2 puffs into the lungs daily. 4 g 11   vitamin C (ASCORBIC ACID) 250 MG tablet Take 250 mg by mouth daily.     clopidogrel (PLAVIX) 75 MG tablet Take 1 tablet (75 mg total) by mouth daily. (Patient not taking: Reported on 05/03/2022) 30 tablet 11   No current facility-administered medications for this visit.    No Known Allergies  Review of Systems  Constitutional:  Negative for activity change, appetite change, fatigue and unexpected weight change.  HENT:  Negative for trouble swallowing and voice change.   Eyes:  Negative for visual disturbance.  Respiratory:  Positive for cough (Since starting inhaler).   Cardiovascular:  Positive for palpitations (With atrial fibrillation). Negative for chest pain and leg swelling.  Genitourinary:  Negative for difficulty urinating and dysuria.  Musculoskeletal:  Negative for arthralgias and joint swelling.       Calf pain with ambulation consistent with claudication  Neurological:  Negative for seizures and weakness.  Hematological:  Negative for adenopathy. Bruises/bleeds easily (Anticoagulation).  All other systems reviewed and are negative.   BP (!) 148/68   Pulse 80   Resp 20   Ht 6' 1.5" (1.867 m)   Wt 201 lb (91.2 kg)   SpO2 95% Comment: RA  BMI 26.16 kg/m  Physical Exam Vitals reviewed.  Constitutional:      General: He is not in acute distress.    Appearance: Normal appearance.  HENT:     Mouth/Throat:     Comments: Poor dentition Eyes:     General: No scleral icterus.     Extraocular Movements: Extraocular movements intact.  Neck:     Vascular: No carotid bruit.  Cardiovascular:     Rate and Rhythm: Normal rate and regular rhythm.     Heart sounds: Murmur (2/6 systolic murmur) heard.  Pulmonary:     Effort: No respiratory distress.     Breath sounds: Normal breath sounds. No wheezing or rales.  Abdominal:     General: There is no distension.     Palpations: Abdomen is soft.  Musculoskeletal:        General: No swelling.  Lymphadenopathy:     Cervical: No cervical adenopathy.  Skin:    General: Skin is warm and dry.  Neurological:     General: No focal deficit present.     Mental Status: He is alert and oriented to person, place, and time.     Cranial Nerves: No cranial nerve deficit.     Motor: No weakness.    Diagnostic Tests: NUCLEAR MEDICINE PET SKULL BASE TO THIGH   TECHNIQUE: 10.3 mCi F-18 FDG was injected intravenously. Full-ring PET imaging was performed from the skull base to thigh after the radiotracer. CT data was obtained and used for attenuation correction and anatomic localization.   Fasting blood glucose: 100 mg/dl   COMPARISON:  Chest CT same date. Images from outside low-dose lung cancer screening chest CTs 03/28/2022 and 09/15/2021 without reports.   FINDINGS: Mediastinal blood pool activity: SUV max 2.1   NECK:   No hypermetabolic cervical lymph nodes are identified.There are no lesions of the pharyngeal mucosal space. There is focal hypermetabolic activity along the LV a ridge of the left maxilla which appears to be associated with periodontal disease associated with the most posterior remaining left maxillary molar (SUV max 7.0). Activity  within the muscles of phonation is within physiologic limits.   Incidental CT findings: Extensive bilateral carotid atherosclerosis.   CHEST:   There are no hypermetabolic mediastinal, hilar or axillary lymph nodes. 9 mm left upper lobe pulmonary nodule on image 01/7  is hypermetabolic for size, with an SUV max of 5.2. No other hypermetabolic nodules identified.   Incidental CT findings: Deferred to separate CT report same date.   ABDOMEN/PELVIS:   There is no hypermetabolic activity within the liver, adrenal glands, spleen or pancreas. 2.3 x 1.7 cm left adrenal nodule on image 124/4 has an SUV max of 3.3 and measures 29 HU. This appears similar in size to CT 09/15/2021. There is no hypermetabolic nodal activity. Bowel activity within physiologic limits.   Incidental CT findings: Scattered hepatic cysts. Diffuse aortic and branch vessel atherosclerosis. 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   SKELETON:   There is no hypermetabolic activity to suggest osseous metastatic disease.   Incidental CT findings: Mild spondylosis.   IMPRESSION: 1. Hypermetabolic left upper lobe pulmonary nodule, suspicious for primary bronchogenic carcinoma. No other hypermetabolic nodules or evidence of metastatic disease. 2. Indeterminate density left adrenal nodule is unchanged in size from 09/15/2021 CT and shows no significant hypermetabolic activity, likely an incidental adenoma. Attention on follow-up recommended. 3. See separate chest CT report for additional incidental thoracic findings. 4. Hypermetabolic left maxillary molar periodontal disease. Correlate clinically.     Electronically Signed   By: Richardean Sale M.D.   On: 04/12/2022 15:10  I personally reviewed the PET/CT images.  There is a 9 mm left upper lobe nodule that is markedly hypermetabolic.  No hypermetabolic adenopathy or evidence of distant metastasis.  Some pleural calcification and apical scarring.  Coronary calcification and thoracic aortic calcification.  Impression: Samuel Ferrell is a 76 year old gentleman with a past history significant for tobacco abuse, COPD, hypertension, hyperlipidemia, atrial fibrillation with 2 prior ablations, atypical atrial flutter, TIA,  anemia, reflux, melanoma, and ethanol abuse (quit 5 years ago).  He was found to have a lung nodule on a low-dose screening CT.  Over time the nodule increased in size.  On PET/CT it is markedly hypermetabolic.  Differential diagnosis includes new primary bronchogenic carcinoma as well as infectious and inflammatory nodules.  Of these, non-small cell lung cancer is the most likely, and it has to be considered that unless we can prove otherwise.  We discussed potential treatment options including surgical resection versus stereotactic radiation.  Even though he does not have a diagnosis he would likely be a candidate for empiric radiation given the high degree of probability that this is cancerous.  We did discuss that surgery is the gold standard and he has adequate lung function to tolerate resection.  I discussed the proposed operative procedure with him.  We would need to have this nodule marked as it is not immediately adjacent to the surface.  That would be done by navigational bronchoscopy.  We will discuss with Dr. Verlee Monte if he would like to do that portion of the procedure.  We then would proceed with robotic left VATS for wedge resection and possible left upper lobectomy.   I informed him of the general nature of the procedure including the need for general anesthesia, the incisions to be used, the intraoperative decision making based on frozen section, the use of drains to postoperatively, the expected hospital stay, and the overall recovery.  I informed him of the indications, risks, benefits, and alternatives.  He understands  the risks include, but are not limited to death, MI, DVT, PE, bleeding, possible need for transfusion, infection, prolonged air leak, cardiac arrhythmias, intercostal neuralgia, as well as the possibility of other unforeseeable complications.  He understands and accepts the risk and agrees to proceed.  He has a heart murmur on exam and has some calcification around his  aortic valve.  He also has extensive coronary calcification on CT.  In addition he has his atrial fibrillation issues.  I think it would be prudent to have cardiology clearance prior to possible lobectomy.  Hypertension-blood pressure mildly elevated today.  On Lopressor.  Atrial fibrillation-currently has a regular rhythm on Tikosyn.  He is on Eliquis.,  Will need to hold that prior to procedure.  Hyperlipidemia-on Lipitor.  Tobacco abuse-emphasized the importance of tobacco cessation for his cardiovascular and pulmonary health.  Plan: Cardiology clearance 2. Once cleared by cardiology we will plan robotic left VATS for wedge resection and possible left upper lobectomy.  We will arrange for navigational marking prior to resection with Dr. Verlee Monte. 3.  Stop Eliquis 48 hours prior to procedure.   Melrose Nakayama, MD [facility] [phone]

## 2022-05-03 NOTE — H&P (View-Only) (Signed)
PCP is Tisovec, Fransico Him, MD Referring Provider is Tisovec, Fransico Him, MD  Chief Complaint  Patient presents with   Lung Lesion    Surgical consult, Chest CT 03/28/22/PET Scan 04/12/22/ PFT's 04/27/22/Bronch 04/24/22     HPI: Samuel Ferrell is for consultation regarding a left upper lobe lung nodule.  Samuel Ferrell is a 76 year old gentleman with a past history significant for tobacco abuse, COPD, hypertension, hyperlipidemia, atrial fibrillation with 2 prior ablations, atypical atrial flutter, TIA, anemia, reflux, melanoma, and ethanol abuse (quit 5 years ago).  He had a low-dose screening CT for lung cancer back in January which showed a left upper lobe nodule.  A follow-up scan about 4 months later showed the nodule had increased in size by 2 mm.  He then had a PET/CT which shows the nodule is hypermetabolic.  He was referred to Dr. Verlee Monte.  Navigational bronchoscopy was nondiagnostic.  He smoked about a pack of cigarettes daily for 60 years.  He says he is now a little bit less than that but has not tried to quit altogether.  He complains of a cough since he started his inhaler but did not have one prior to that.  He says he will feel an episode of atrial fibrillation about once a month but only every 2 or 3 months with a significant.  He denies any chest pain, pressure, or tightness.  He does complain of pain in the calves when he walks.  More limited by that.  Shortness of breath only with heavy exertion.  Zubrod Score: At the time of surgery this patient's most appropriate activity status/level should be described as: []     0    Normal activity, no symptoms [x]     1    Restricted in physical strenuous activity but ambulatory, able to do out light work []     2    Ambulatory and capable of self care, unable to do work activities, up and about >50 % of waking hours                              []     3    Only limited self care, in bed greater than 50% of waking hours []     4    Completely disabled, no self  care, confined to bed or chair []     5    Moribund  Past Medical History:  Diagnosis Date   Afib [facility])    s/p ablation x2 by Dr [name] at [facility] in 2007, 2009   Alcohol abuse    Anemia    Atypical atrial flutter ([place])    COPD (chronic obstructive pulmonary disease) ([place])    COVID    mild case   CVA (cerebral infarction)    GERD (gastroesophageal reflux disease)    Hyperlipidemia    Hypertension    Palpitations    Pneumonia    1970's   Skin cancer--melanoma    Stroke ([place])    TIA    Past Surgical History:  Procedure Laterality Date   ANTERIOR CERVICAL DECOMP/DISCECTOMY FUSION N/A 07/24/2018   Procedure: ANTERIOR CERVICAL DECOMPRESSION/DISCECTOMY FUSION, INTERBODY PROSTHESIS, PLATE SCREWS CERVICAL FIVE- CERVICAL SIX;  Surgeon: Newman Pies, MD;  Location: [place];  Service: Neurosurgery;  Laterality: N/A;  ANTERIOR CERVICAL DECOMPRESSION/DISCECTOMY FUSION, INTERBODY PROSTHESIS, PLATE SCREWS CERVICAL FIVE- CERVICAL SIX   APPENDECTOMY     ATRIAL FIBRILLATION ABLATION  2007 ,2009   Dr [name] at  [facility]   BRONCHIAL BIOPSY  04/24/2022   Procedure: BRONCHIAL BIOPSIES;  Surgeon: Maryjane Hurter, MD;  Location: [place];  Service: Pulmonary;;   BRONCHIAL BRUSHINGS  04/24/2022   Procedure: BRONCHIAL BRUSHINGS;  Surgeon: Maryjane Hurter, MD;  Location: [place];  Service: Pulmonary;;   BRONCHIAL NEEDLE ASPIRATION BIOPSY  04/24/2022   Procedure: BRONCHIAL NEEDLE ASPIRATION BIOPSIES;  Surgeon: Maryjane Hurter, MD;  Location: [place];  Service: Pulmonary;;   BRONCHIAL WASHINGS  04/24/2022   Procedure: BRONCHIAL WASHINGS;  Surgeon: Maryjane Hurter, MD;  Location: [place];  Service: Pulmonary;;   FRACTURE SURGERY     right clavicle   HEMOSTASIS CONTROL  04/24/2022   Procedure: HEMOSTASIS CONTROL;  Surgeon: Maryjane Hurter, MD;  Location: [facility] ENDOSCOPY;  Service: Pulmonary;;   PERIPHERAL VASCULAR INTERVENTION Bilateral 01/05/2022   Procedure: PERIPHERAL  VASCULAR INTERVENTION;  Surgeon: Marty Heck, MD;  Location: [place] [facility];  Service: Cardiovascular;  Laterality: Bilateral;  Bilateral Renal Artery Stents   RENAL ANGIOGRAPHY Bilateral 01/05/2022   Procedure: RENAL ARTERY STENTING;  Surgeon: Marty Heck, MD;  Location: [place] [facility];  Service: Cardiovascular;  Laterality: Bilateral;   VIDEO BRONCHOSCOPY WITH RADIAL ENDOBRONCHIAL ULTRASOUND  04/24/2022   Procedure: VIDEO BRONCHOSCOPY WITH RADIAL ENDOBRONCHIAL ULTRASOUND;  Surgeon: Maryjane Hurter, MD;  Location[facility] ENDOSCOPY;  Service: Pulmonary;;    Family History  Problem Relation Age of Onset   Lung cancer Brother        smoked   Cancer Other    Colon cancer Neg Hx    Esophageal cancer Neg Hx    Rectal cancer Neg Hx    Stomach cancer Neg Hx     Social History Social History   Tobacco Use   Smoking status: Every Day    Packs/day: 1.00    Years: 61.00    Total pack years: 61.00    Types: Cigarettes   Smokeless tobacco: Never  Vaping Use   Vaping Use: Never used  Substance Use Topics   Alcohol use: Not Currently    Comment: heavy, up to 6 beers per day   Drug use: No    Current Outpatient Medications  Medication Sig Dispense Refill   apixaban (ELIQUIS) 5 MG TABS tablet Take 5 mg by mouth 2 (two) times daily.     aspirin EC 81 MG tablet Take 81 mg by mouth daily. Swallow whole.     atorvastatin (LIPITOR) 40 MG tablet Take 40 mg by mouth every evening.      dofetilide (TIKOSYN) 500 MCG capsule Take 1 capsule (500 mcg total) by mouth 2 (two) times daily. 180 capsule 2   enalapril (VASOTEC) 10 MG tablet Take 1 tablet (10 mg total) by mouth at bedtime. 90 tablet 3   ferrous sulfate 325 (65 FE) MG tablet Take 325 mg by mouth daily with breakfast.     finasteride (PROSCAR) 5 MG tablet Take 5 mg by mouth daily.     Magnesium 250 MG TABS Take 250 mg by mouth daily.     metoprolol tartrate (LOPRESSOR) 50 MG tablet Take 1.5 tablets (75 mg total) by  mouth 2 (two) times daily. 270 tablet 3   Multiple Vitamin (MULTIVITAMIN) tablet Take 1 tablet by mouth daily.     Multiple Vitamins-Minerals (ONE-A-DAY MENS HEALTH FORMULA PO) Take 1 tablet by mouth daily.     omeprazole (PRILOSEC) 20 MG capsule Take 20 mg by mouth daily.     potassium chloride SA (KLOR-CON  M20) 20 MEQ tablet Take 1 tablet (20 mEq total) by mouth daily. 90 tablet 2   Tiotropium Bromide-Olodaterol (STIOLTO RESPIMAT) 2.5-2.5 MCG/ACT AERS Inhale 2 puffs into the lungs daily. 4 g 11   vitamin C (ASCORBIC ACID) 250 MG tablet Take 250 mg by mouth daily.     clopidogrel (PLAVIX) 75 MG tablet Take 1 tablet (75 mg total) by mouth daily. (Patient not taking: Reported on 05/03/2022) 30 tablet 11   No current facility-administered medications for this visit.    No Known Allergies  Review of Systems  Constitutional:  Negative for activity change, appetite change, fatigue and unexpected weight change.  HENT:  Negative for trouble swallowing and voice change.   Eyes:  Negative for visual disturbance.  Respiratory:  Positive for cough (Since starting inhaler).   Cardiovascular:  Positive for palpitations (With atrial fibrillation). Negative for chest pain and leg swelling.  Genitourinary:  Negative for difficulty urinating and dysuria.  Musculoskeletal:  Negative for arthralgias and joint swelling.       Calf pain with ambulation consistent with claudication  Neurological:  Negative for seizures and weakness.  Hematological:  Negative for adenopathy. Bruises/bleeds easily (Anticoagulation).  All other systems reviewed and are negative.   BP (!) 148/68   Pulse 80   Resp 20   Ht 6' 1.5" (1.867 m)   Wt 201 lb (91.2 kg)   SpO2 95% Comment: RA  BMI 26.16 kg/m  Physical Exam Vitals reviewed.  Constitutional:      General: He is not in acute distress.    Appearance: Normal appearance.  HENT:     Mouth/Throat:     Comments: Poor dentition Eyes:     General: No scleral icterus.     Extraocular Movements: Extraocular movements intact.  Neck:     Vascular: No carotid bruit.  Cardiovascular:     Rate and Rhythm: Normal rate and regular rhythm.     Heart sounds: Murmur (2/6 systolic murmur) heard.  Pulmonary:     Effort: No respiratory distress.     Breath sounds: Normal breath sounds. No wheezing or rales.  Abdominal:     General: There is no distension.     Palpations: Abdomen is soft.  Musculoskeletal:        General: No swelling.  Lymphadenopathy:     Cervical: No cervical adenopathy.  Skin:    General: Skin is warm and dry.  Neurological:     General: No focal deficit present.     Mental Status: He is alert and oriented to person, place, and time.     Cranial Nerves: No cranial nerve deficit.     Motor: No weakness.    Diagnostic Tests: NUCLEAR MEDICINE PET SKULL BASE TO THIGH   TECHNIQUE: 10.3 mCi F-18 FDG was injected intravenously. Full-ring PET imaging was performed from the skull base to thigh after the radiotracer. CT data was obtained and used for attenuation correction and anatomic localization.   Fasting blood glucose: 100 mg/dl   COMPARISON:  Chest CT same date. Images from outside low-dose lung cancer screening chest CTs 03/28/2022 and 09/15/2021 without reports.   FINDINGS: Mediastinal blood pool activity: SUV max 2.1   NECK:   No hypermetabolic cervical lymph nodes are identified.There are no lesions of the pharyngeal mucosal space. There is focal hypermetabolic activity along the LV a ridge of the left maxilla which appears to be associated with periodontal disease associated with the most posterior remaining left maxillary molar (SUV max 7.0). Activity  within the muscles of phonation is within physiologic limits.   Incidental CT findings: Extensive bilateral carotid atherosclerosis.   CHEST:   There are no hypermetabolic mediastinal, hilar or axillary lymph nodes. 9 mm left upper lobe pulmonary nodule on image 18/5  is hypermetabolic for size, with an SUV max of 5.2. No other hypermetabolic nodules identified.   Incidental CT findings: Deferred to separate CT report same date.   ABDOMEN/PELVIS:   There is no hypermetabolic activity within the liver, adrenal glands, spleen or pancreas. 2.3 x 1.7 cm left adrenal nodule on image 124/4 has an SUV max of 3.3 and measures 29 HU. This appears similar in size to CT 09/15/2021. There is no hypermetabolic nodal activity. Bowel activity within physiologic limits.   Incidental CT findings: Scattered hepatic cysts. Diffuse aortic and branch vessel atherosclerosis. 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   SKELETON:   There is no hypermetabolic activity to suggest osseous metastatic disease.   Incidental CT findings: Mild spondylosis.   IMPRESSION: 1. Hypermetabolic left upper lobe pulmonary nodule, suspicious for primary bronchogenic carcinoma. No other hypermetabolic nodules or evidence of metastatic disease. 2. Indeterminate density left adrenal nodule is unchanged in size from 09/15/2021 CT and shows no significant hypermetabolic activity, likely an incidental adenoma. Attention on follow-up recommended. 3. See separate chest CT report for additional incidental thoracic findings. 4. Hypermetabolic left maxillary molar periodontal disease. Correlate clinically.     Electronically Signed   By: Richardean Sale M.D.   On: 04/12/2022 15:10  I personally reviewed the PET/CT images.  There is a 9 mm left upper lobe nodule that is markedly hypermetabolic.  No hypermetabolic adenopathy or evidence of distant metastasis.  Some pleural calcification and apical scarring.  Coronary calcification and thoracic aortic calcification.  Impression: Samuel Ferrell is a 76 year old gentleman with a past history significant for tobacco abuse, COPD, hypertension, hyperlipidemia, atrial fibrillation with 2 prior ablations, atypical atrial flutter, TIA,  anemia, reflux, melanoma, and ethanol abuse (quit 5 years ago).  He was found to have a lung nodule on a low-dose screening CT.  Over time the nodule increased in size.  On PET/CT it is markedly hypermetabolic.  Differential diagnosis includes new primary bronchogenic carcinoma as well as infectious and inflammatory nodules.  Of these, non-small cell lung cancer is the most likely, and it has to be considered that unless we can prove otherwise.  We discussed potential treatment options including surgical resection versus stereotactic radiation.  Even though he does not have a diagnosis he would likely be a candidate for empiric radiation given the high degree of probability that this is cancerous.  We did discuss that surgery is the gold standard and he has adequate lung function to tolerate resection.  I discussed the proposed operative procedure with him.  We would need to have this nodule marked as it is not immediately adjacent to the surface.  That would be done by navigational bronchoscopy.  We will discuss with Dr. Verlee Monte if he would like to do that portion of the procedure.  We then would proceed with robotic left VATS for wedge resection and possible left upper lobectomy.   I informed him of the general nature of the procedure including the need for general anesthesia, the incisions to be used, the intraoperative decision making based on frozen section, the use of drains to postoperatively, the expected hospital stay, and the overall recovery.  I informed him of the indications, risks, benefits, and alternatives.  He understands  the risks include, but are not limited to death, MI, DVT, PE, bleeding, possible need for transfusion, infection, prolonged air leak, cardiac arrhythmias, intercostal neuralgia, as well as the possibility of other unforeseeable complications.  He understands and accepts the risk and agrees to proceed.  He has a heart murmur on exam and has some calcification around his  aortic valve.  He also has extensive coronary calcification on CT.  In addition he has his atrial fibrillation issues.  I think it would be prudent to have cardiology clearance prior to possible lobectomy.  Hypertension-blood pressure mildly elevated today.  On Lopressor.  Atrial fibrillation-currently has a regular rhythm on Tikosyn.  He is on Eliquis.,  Will need to hold that prior to procedure.  Hyperlipidemia-on Lipitor.  Tobacco abuse-emphasized the importance of tobacco cessation for his cardiovascular and pulmonary health.  Plan: Cardiology clearance 2. Once cleared by cardiology we will plan robotic left VATS for wedge resection and possible left upper lobectomy.  We will arrange for navigational marking prior to resection with Dr. Verlee Monte. 3.  Stop Eliquis 48 hours prior to procedure.   Melrose Nakayama, MD [facility] [phone]

## 2022-05-08 ENCOUNTER — Encounter: Payer: Self-pay | Admitting: *Deleted

## 2022-05-08 ENCOUNTER — Other Ambulatory Visit: Payer: Self-pay | Admitting: *Deleted

## 2022-05-09 ENCOUNTER — Encounter: Payer: Self-pay | Admitting: Thoracic Surgery (Cardiothoracic Vascular Surgery)

## 2022-05-09 ENCOUNTER — Other Ambulatory Visit: Payer: Self-pay | Admitting: *Deleted

## 2022-05-09 ENCOUNTER — Encounter: Payer: Self-pay | Admitting: *Deleted

## 2022-05-09 DIAGNOSIS — R911 Solitary pulmonary nodule: Secondary | ICD-10-CM

## 2022-05-16 ENCOUNTER — Telehealth: Payer: Self-pay | Admitting: Student

## 2022-05-16 NOTE — Telephone Encounter (Signed)
Called and spoke to patient and he is wanting to know if after surgery does Dr Verlee Monte still want him to continue the inhaler. He has surgery on Friday.  Please advise sir

## 2022-05-16 NOTE — Pre-Procedure Instructions (Signed)
Surgical Instructions    Your procedure is scheduled on Friday 05/19/22.   Report to [facility] Main Entrance "A" at 05:30 A.M., then check in with the Admitting office.  Call this number if you have problems the morning of surgery:  [phone]   If you have any questions prior to your surgery date call [phone]: Open Monday-Friday 8am-4pm    Remember:  Do not eat or drink after midnight the night before your surgery    Take these medicines the morning of surgery with A SIP OF WATER:   dofetilide (TIKOSYN)   finasteride (PROSCAR)  metoprolol tartrate (LOPRESSOR)  omeprazole (PRILOSEC)  potassium chloride SA (KLOR-CON M20)   Tiotropium Bromide-Olodaterol (STIOLTO RESPIMAT)  Please follow your surgeon's instructions regarding apixaban (ELIQUIS) and Aspirin. If you have not received instructions then please contact your surgeon's office for instructions.  As of today, STOP taking any Aspirin (unless otherwise instructed by your surgeon) Aleve, Naproxen, Ibuprofen, Motrin, Advil, Goody's, BC's, all herbal medications, fish oil, and all vitamins.           Do not wear jewelry or makeup. Do not wear lotions, powders, perfumes/cologne or deodorant. Do not shave 48 hours prior to surgery.  Men may shave face and neck. Do not bring valuables to the hospital. Do not wear nail polish, gel polish, artificial nails, or any other type of covering on natural nails (fingers and toes) If you have artificial nails or gel coating that need to be removed by a nail salon, please have this removed prior to surgery. Artificial nails or gel coating may interfere with anesthesia's ability to adequately monitor your vital signs.  [facility] is not responsible for any belongings or valuables.    Do NOT Smoke (Tobacco/Vaping)  24 hours prior to your procedure  If you use a CPAP at night, you may bring your mask for your overnight stay.   Contacts, glasses, hearing aids, dentures or partials may not  be worn into surgery, please bring cases for these belongings   For patients admitted to the hospital, discharge time will be determined by your treatment team.   Patients discharged the day of surgery will not be allowed to drive home, and someone needs to stay with them for 24 hours.   SURGICAL WAITING ROOM VISITATION Patients having surgery or a procedure may have no more than 2 support people in the waiting area - these visitors may rotate.   Children under the age of 22 must have an adult with them who is not the patient. If the patient needs to stay at the hospital during part of their recovery, the visitor guidelines for inpatient rooms apply. Pre-op nurse will coordinate an appropriate time for 1 support person to accompany patient in pre-op.  This support person may not rotate.   Please refer to the [facility] website for the visitor guidelines for Inpatients (after your surgery is over and you are in a regular room).    Special instructions:    Oral Hygiene is also important to reduce your risk of infection.  Remember - BRUSH YOUR TEETH THE MORNING OF SURGERY WITH YOUR REGULAR TOOTHPASTE   [facility]- Preparing For Surgery  Before surgery, you can play an important role. Because skin is not sterile, your skin needs to be as free of germs as possible. You can reduce the number of germs on your skin by washing with CHG (chlorahexidine gluconate) Soap before surgery.  CHG is an antiseptic cleaner which kills germs and  bonds with the skin to continue killing germs even after washing.     Please do not use if you have an allergy to CHG or antibacterial soaps. If your skin becomes reddened/irritated stop using the CHG.  Do not shave (including legs and underarms) for at least 48 hours prior to first CHG shower. It is OK to shave your face.  Please follow these instructions carefully.     Shower the NIGHT BEFORE SURGERY and the MORNING OF SURGERY with CHG Soap.   If you chose to  wash your hair, wash your hair first as usual with your normal shampoo. After you shampoo, rinse your hair and body thoroughly to remove the shampoo.  Then [employer] and genitals (private parts) with your normal soap and rinse thoroughly to remove soap.  After that Use CHG Soap as you would any other liquid soap. You can apply CHG directly to the skin and wash gently with a scrungie or a clean washcloth.   Apply the CHG Soap to your body ONLY FROM THE NECK DOWN.  Do not use on open wounds or open sores. Avoid contact with your eyes, ears, mouth and genitals (private parts). Wash Face and genitals (private parts)  with your normal soap.   Wash thoroughly, paying special attention to the area where your surgery will be performed.  Thoroughly rinse your body with warm water from the neck down.  DO NOT shower/wash with your normal soap after using and rinsing off the CHG Soap.  Pat yourself dry with a CLEAN TOWEL.  Wear CLEAN PAJAMAS to bed the night before surgery  Place CLEAN SHEETS on your bed the night before your surgery  DO NOT SLEEP WITH PETS.   Day of Surgery:  Take a shower with CHG soap. Wear Clean/Comfortable clothing the morning of surgery Do not apply any deodorants/lotions.   Remember to brush your teeth WITH YOUR REGULAR TOOTHPASTE.    If you received a COVID test during your pre-op visit, it is requested that you wear a mask when out in public, stay away from anyone that may not be feeling well, and notify your surgeon if you develop symptoms. If you have been in contact with anyone that has tested positive in the last 10 days, please notify your surgeon.    Please read over the following fact sheets that you were given.

## 2022-05-16 NOTE — Telephone Encounter (Signed)
Called and let patient know that Dr Verlee Monte suggest to continue the inhaler even after surgery. Patient has follow up with our office 05/18/2022. Nothing further needed

## 2022-05-16 NOTE — Telephone Encounter (Signed)
Yep would continue inhaler

## 2022-05-17 ENCOUNTER — Other Ambulatory Visit: Payer: Self-pay

## 2022-05-17 ENCOUNTER — Other Ambulatory Visit: Payer: Self-pay | Admitting: Student

## 2022-05-17 ENCOUNTER — Ambulatory Visit (HOSPITAL_COMMUNITY)
Admission: RE | Admit: 2022-05-17 | Discharge: 2022-05-17 | Disposition: A | Discharge status: Home / Self Care | Payer: No Typology Code available for payment source | Source: Ambulatory Visit | Attending: Thoracic Surgery (Cardiothoracic Vascular Surgery) | Admitting: Thoracic Surgery (Cardiothoracic Vascular Surgery)

## 2022-05-17 ENCOUNTER — Encounter (HOSPITAL_COMMUNITY)
Admission: RE | Admit: 2022-05-17 | Discharge: 2022-05-17 | Disposition: A | Discharge status: Home / Self Care | Payer: No Typology Code available for payment source | Source: Ambulatory Visit | Attending: Thoracic Surgery (Cardiothoracic Vascular Surgery) | Admitting: Thoracic Surgery (Cardiothoracic Vascular Surgery)

## 2022-05-17 ENCOUNTER — Encounter (HOSPITAL_COMMUNITY): Payer: Self-pay

## 2022-05-17 DIAGNOSIS — I482 Chronic atrial fibrillation, unspecified: Secondary | ICD-10-CM | POA: Diagnosis not present

## 2022-05-17 DIAGNOSIS — I440 Atrioventricular block, first degree: Secondary | ICD-10-CM | POA: Diagnosis not present

## 2022-05-17 DIAGNOSIS — Z7901 Long term (current) use of anticoagulants: Secondary | ICD-10-CM | POA: Insufficient documentation

## 2022-05-17 DIAGNOSIS — K056 Periodontal disease, unspecified: Secondary | ICD-10-CM | POA: Insufficient documentation

## 2022-05-17 DIAGNOSIS — F17210 Nicotine dependence, cigarettes, uncomplicated: Secondary | ICD-10-CM | POA: Diagnosis not present

## 2022-05-17 DIAGNOSIS — Z79899 Other long term (current) drug therapy: Secondary | ICD-10-CM | POA: Insufficient documentation

## 2022-05-17 DIAGNOSIS — J439 Emphysema, unspecified: Secondary | ICD-10-CM | POA: Diagnosis not present

## 2022-05-17 DIAGNOSIS — I4892 Unspecified atrial flutter: Secondary | ICD-10-CM | POA: Insufficient documentation

## 2022-05-17 DIAGNOSIS — Z8673 Personal history of transient ischemic attack (TIA), and cerebral infarction without residual deficits: Secondary | ICD-10-CM | POA: Insufficient documentation

## 2022-05-17 DIAGNOSIS — Z20822 Contact with and (suspected) exposure to covid-19: Secondary | ICD-10-CM | POA: Diagnosis not present

## 2022-05-17 DIAGNOSIS — Z01818 Encounter for other preprocedural examination: Secondary | ICD-10-CM | POA: Diagnosis not present

## 2022-05-17 DIAGNOSIS — D649 Anemia, unspecified: Secondary | ICD-10-CM | POA: Insufficient documentation

## 2022-05-17 DIAGNOSIS — I10 Essential (primary) hypertension: Secondary | ICD-10-CM | POA: Insufficient documentation

## 2022-05-17 DIAGNOSIS — Z77090 Contact with and (suspected) exposure to asbestos: Secondary | ICD-10-CM | POA: Diagnosis not present

## 2022-05-17 DIAGNOSIS — R911 Solitary pulmonary nodule: Secondary | ICD-10-CM | POA: Diagnosis not present

## 2022-05-17 HISTORY — DX: Cardiac murmur, unspecified: R01.1

## 2022-05-17 LAB — PROTIME-INR
INR: 1.1 (ref 0.8–1.2)
Prothrombin Time: 14.2 seconds (ref 11.4–15.2)

## 2022-05-17 LAB — COMPREHENSIVE METABOLIC PANEL
ALT: 33 U/L (ref 0–44)
AST: 29 U/L (ref 15–41)
Albumin: 3.7 g/dL (ref 3.5–5.0)
Alkaline Phosphatase: 65 U/L (ref 38–126)
Anion gap: 12 (ref 5–15)
BUN: 18 mg/dL (ref 8–23)
CO2: 21 mmol/L — ABNORMAL LOW (ref 22–32)
Calcium: 8.8 mg/dL — ABNORMAL LOW (ref 8.9–10.3)
Chloride: 106 mmol/L (ref 98–111)
Creatinine, Ser: 1.41 mg/dL — ABNORMAL HIGH (ref 0.61–1.24)
GFR, Estimated: 52 mL/min — ABNORMAL LOW (ref >60)
Glucose, Bld: 107 mg/dL — ABNORMAL HIGH (ref 70–99)
Potassium: 4.1 mmol/L (ref 3.5–5.1)
Sodium: 139 mmol/L (ref 135–145)
Total Bilirubin: 0.5 mg/dL (ref 0.3–1.2)
Total Protein: 6.7 g/dL (ref 6.5–8.1)

## 2022-05-17 LAB — TYPE AND SCREEN
ABO/RH(D): O POS
Antibody Screen: NEGATIVE

## 2022-05-17 LAB — BLOOD GAS, ARTERIAL
Acid-Base Excess: 0.6 mmol/L (ref 0.0–2.0)
Bicarbonate: 24.5 mmol/L (ref 20.0–28.0)
Drawn by: 58793
O2 Saturation: 99.5 %
Patient temperature: 37
pCO2 arterial: 36 mmHg (ref 32–48)
pH, Arterial: 7.44 (ref 7.35–7.45)
pO2, Arterial: 95 mmHg (ref 83–108)

## 2022-05-17 LAB — CBC
HCT: 35.6 % — ABNORMAL LOW (ref 39.0–52.0)
Hemoglobin: 12.4 g/dL — ABNORMAL LOW (ref 13.0–17.0)
MCH: 32.4 pg (ref 26.0–34.0)
MCHC: 34.8 g/dL (ref 30.0–36.0)
MCV: 93 fL (ref 80.0–100.0)
Platelets: 153 10*3/uL (ref 150–400)
RBC: 3.83 MIL/uL — ABNORMAL LOW (ref 4.22–5.81)
RDW: 13.6 % (ref 11.5–15.5)
WBC: 5.6 10*3/uL (ref 4.0–10.5)
nRBC: 0 % (ref 0.0–0.2)

## 2022-05-17 LAB — APTT: aPTT: 31 seconds (ref 24–36)

## 2022-05-17 LAB — SURGICAL PCR SCREEN
MRSA, PCR: NEGATIVE
Staphylococcus aureus: NEGATIVE

## 2022-05-17 MED ORDER — STIOLTO RESPIMAT 2.5-2.5 MCG/ACT IN AERS: 2.0000 | INHALATION_SPRAY | Freq: Every day | RESPIRATORY_TRACT | 0 refills | Status: DC

## 2022-05-17 NOTE — Progress Notes (Signed)
PCP - Domenick Gong Cardiologist - [facility]  PPM/ICD - denies  Chest x-ray - pending EKG - pending Stress Test - 07/16/18 ECHO - 06/25/18 Cardiac Cath - denies  Sleep Study - denies CPAP - n/a  No diabetes.  Please follow your surgeon's instructions regarding apixaban (ELIQUIS) and Aspirin. If you have not received instructions then please contact your surgeon's office for instructions.   Per Progress note from Dr. Verlee Monte- patient to stop Eliquis 2 days prior to procedure. Last dose should be 9/5. Informed patient.  As of today, STOP taking any Aspirin (unless otherwise instructed by your surgeon) Aleve, Naproxen, Ibuprofen, Motrin, Advil, Goody's, BC's, all herbal medications, fish oil, and all vitamins.  ERAS Protcol - n/a PRE-SURGERY Ensure or G2- n/a  COVID TEST- pending   Anesthesia review: yes, James aware  Patient denies shortness of breath, fever, cough and chest pain at PAT appointment   All instructions explained to the patient, with a verbal understanding of the material. Patient agrees to go over the instructions while at home for a better understanding. Patient also instructed to self quarantine after being tested for COVID-19. The opportunity to ask questions was provided.

## 2022-05-18 ENCOUNTER — Encounter (HOSPITAL_COMMUNITY): Payer: Self-pay | Admitting: Anesthesiology

## 2022-05-18 ENCOUNTER — Ambulatory Visit: Payer: Non-veteran care | Admitting: Student

## 2022-05-18 NOTE — Anesthesia Preprocedure Evaluation (Addendum)
Anesthesia Evaluation  Patient identified by MRN, date of birth, ID band Patient awake    Reviewed: Allergy & Precautions, NPO status , Patient's Chart, lab work & pertinent test results  History of Anesthesia Complications Negative for: history of anesthetic complications  Airway Mallampati: III  TM Distance: >3 FB Neck ROM: Full    Dental  (+) Dental Advisory Given, Poor Dentition, Chipped, Missing   Pulmonary COPD,  COPD inhaler, Current Smoker and Patient abstained from smoking.,  Pulmonary nodule   Pulmonary exam normal breath sounds clear to auscultation       Cardiovascular hypertension, Pt. on medications and Pt. on home beta blockers + Peripheral Vascular Disease  Normal cardiovascular exam+ dysrhythmias (s/p ablation x2, on Eliquis and Tikosyn) Atrial Fibrillation + Valvular Problems/Murmurs  Rhythm:Regular Rate:Normal     Neuro/Psych S/P ACDF TIACVA negative psych ROS   GI/Hepatic Neg liver ROS, GERD  Medicated and Controlled,  Endo/Other  negative endocrine ROS  Renal/GU Cr 1.41  negative genitourinary   Musculoskeletal  (+) Arthritis ,   Abdominal   Peds  Hematology  (+) Blood dyscrasia (Hgb 12.4), anemia ,   Anesthesia Other Findings Day of surgery medications reviewed with patient.  Reproductive/Obstetrics negative OB ROS                         Anesthesia Physical Anesthesia Plan  ASA: 3  Anesthesia Plan: General   Post-op Pain Management: Minimal or no pain anticipated   Induction: Intravenous  PONV Risk Score and Plan: 1 and Treatment may vary due to age or medical condition, Dexamethasone and Ondansetron  Airway Management Planned: Oral ETT  Additional Equipment: None  Intra-op Plan:   Post-operative Plan: Extubation in OR  Informed Consent: I have reviewed the patients History and Physical, chart, labs and discussed the procedure including the risks,  benefits and alternatives for the proposed anesthesia with the patient or authorized representative who has indicated his/her understanding and acceptance.     Dental advisory given  Plan Discussed with: CRNA  Anesthesia Plan Comments: (PAT note by Samuel Caldwell, PA-C:  Follows with cardiology at the [facility] for history of chronic A-fib/flutter (ablation [facility] 2007, 2009), TIA, HTN. He was last seen 02/28/2022 for expedited follow-up as he had just been seen at the [facility] preanesthesia clinic and noted to be in rapid A-fib. He was being evaluated for right flank mass removal. Per cardiology note, A-fib with RVR rates in the 120s to 140s, patient did not take his metoprolol and dofetilide that morning. Documentation states it is relatively reliable that he has recurrent A-fib when he misses his medications. Per note, "1. Atrial fibrillation with RVR: Recurrent A. fib with elevated heart rate and noted on EKG today, heart rate in the preanesthesia clinic was in the 120s, 140 in our clinic. He is generally asymptomatic, mild palpitations only. He denies chest pain, shortness of breath, dizziness, syncope, presyncope. --Likely attributable to missing his medications this morning. He is can return home, take dofetilide and metoprolol. He knows to take an extra dose of metoprolol should he continue to have atrial fibrillation or elevated heart rate.--He can continue surgical planning. He was advised that surgery, anesthesia will likely want to see a an updated EKG showing restoration of normal sinus rhythm, or at least improved heart rate control prior to his upcoming surgery. I scheduled him for a nurse visit next week for updated EKG.--If he has continued atrial fibrillation with or without RVR, we  can discuss scheduling for cardioversion procedure.--Continue apixaban: He has been cleared to hold apixaban for 5 days prior to upcoming procedure."Repeat EKG 03/07/2022 showed sinus rhythm with first-degree block, rate  64. He did subsequently undergo removal of right flank lipoma 03/2022 at the [place].  Current smoker with history of emphysema, asbestos exposure. Patient recently noted to have growing 9 mm left upper lobe nodule, multiple smaller nodules.  Underwent bronchoscopy 04/24/2022 which was ultimately nondiagnostic.  He was referred to cardiothoracic surgery and seen by Dr. Koleen Ferrell on 05/03/2022 and surgical resection of the lesion was recommended.  Dr. Hans Ferrell office did reach out to cardiology at the [facility] for preop clearance.  Cardiology clearance scanned under media tab 05/09/2022 states, "Samuel Ferrell is followed by cardiology at [facility].  The veteran is at low risk for cardiovascular event during the planned moderate risk surgery.  The revised cardiovascular risk index class is <2 and is associated with a </= 0.9% risk of major cardiovascular events.  Samuel Ferrell has the following risk factors: Surgery type.  No history of congestive heart failure, ischemic heart disease, cerebrovascular disease.  No preoperative treatment with insulin, and preoperative creatinine <2 mg/dL.  The veteran can perform >/= 4 METS level of activity and all cardiac conditions are well managed.  The veteran may proceed with surgery with no additional cardiac testing or procedures."  Last dose Eliquis 05/16/2022.  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 Last seen by Dr. Carlis Ferrell 02/14/2022, doing well, Plavix was discontinued at that time.  Preop labs reviewed, creatinine mildly elevated at 1.41, mild anemia with hemoglobin 12.4, labs otherwise unremarkable.  EKG 05/17/2022: Sinus rhythm with first-degree AV block.  Rate 72.  Minimal voltage criteria for LVH, may be normal variant.  PFTs 04/27/2022: FVC-%Pred-Pre % 67 FEV1-%Pred-Pre % 60 FEV1FVC-%Pred-Pre % 90 TLC % pred % 108 RV % pred % 178 DLCO unc % pred % 70 Rpt: View report in Results  Review for more information  PET scan 04/12/2022: IMPRESSION: 1. Hypermetabolic left upper lobe pulmonary nodule, suspicious for primary bronchogenic carcinoma. No other hypermetabolic nodules or evidence of metastatic disease. 2. Indeterminate density left adrenal nodule is unchanged in size from 09/15/2021 CT and shows no significant hypermetabolic activity, likely an incidental adenoma. Attention on follow-up recommended. 3. See separate chest CT report for additional incidental thoracic findings. 4. Hypermetabolic left maxillary molar periodontal disease. Correlate clinically.  Nuclear stress 07/16/2018: . Nuclear stress EF: 64%. . There was no ST segment deviation noted during stress. Marland Kitchen No T wave inversion was noted during stress. . The study is normal. . This is a low risk study. . The left ventricular ejection fraction is normal (55-65%).  TTE 06/25/2018: - Left ventricle: The cavity size was normal. Wall thickness was  normal. Systolic function was normal. The estimated ejection  fraction was in the range of 55% to 60%. Wall motion was normal;  there were no regional wall motion abnormalities. Doppler  parameters are consistent with abnormal left ventricular  relaxation (grade 1 diastolic dysfunction).  - Mitral valve: Mild focal calcification of the anterior leaflet.  )      Anesthesia Quick Evaluation

## 2022-05-18 NOTE — Progress Notes (Signed)
Anesthesia Chart Review:  Follows with cardiology at the [facility] for history of chronic A-fib/flutter (ablation [facility] 2007, 2009), TIA, HTN.  He was last seen 02/28/2022 for expedited follow-up as he had just been seen at the [facility] preanesthesia clinic and noted to be in rapid A-fib.  He was being evaluated for right flank mass removal.  Per cardiology note, A-fib with RVR rates in the 120s to 140s, patient did not take his metoprolol and dofetilide that morning.  Documentation states it is relatively reliable that he has recurrent A-fib when he misses his medications.  Per note, "1. Atrial fibrillation with RVR: Recurrent A. fib with elevated heart rate and  noted on EKG today, heart rate in the preanesthesia clinic was in the 120s, 140 in our clinic. He is generally asymptomatic, mild palpitations only. He denies chest pain, shortness of breath, dizziness, syncope, presyncope. --Likely attributable to missing his medications this morning. He is can return home, take dofetilide and metoprolol. He knows to take an extra dose of metoprolol should he continue to have atrial fibrillation or elevated heart rate.--He can continue surgical planning. He was advised that surgery, anesthesia will likely want to see a an updated EKG showing restoration of normal sinus rhythm, or at least improved heart rate control prior to his upcoming surgery. I scheduled him for a nurse visit next week for updated EKG.--If he has continued atrial fibrillation with or without RVR, we can discuss scheduling for cardioversion procedure.--Continue apixaban: He has been cleared to hold apixaban for 5 days prior to upcoming procedure."  Repeat EKG 03/07/2022 showed sinus rhythm with first-degree block, rate 64.  He did subsequently undergo removal of right flank lipoma 03/2022 at the [place].   Current smoker with history of emphysema, asbestos exposure.  Patient recently noted to have growing 9 mm left upper lobe nodule, multiple smaller nodules.  Underwent  bronchoscopy 04/24/2022 which was ultimately nondiagnostic.  He was referred to cardiothoracic surgery and seen by Dr. Koleen Nimrod on 05/03/2022 and surgical resection of the lesion was recommended.  Dr. [name] office did reach out to cardiology at the [facility] for preop clearance.  Cardiology clearance scanned under media tab 05/09/2022 states, "[name] is followed by cardiology at [facility]  The veteran is at low risk for cardiovascular event during the planned moderate risk surgery.  The revised cardiovascular risk index class is <2 and is associated with a </= 0.9% risk of major cardiovascular events.  [name] has the following risk factors: Surgery type.  No history of congestive heart failure, ischemic heart disease, cerebrovascular disease.  No preoperative treatment with insulin, and preoperative creatinine <2 mg/dL.  The veteran can perform >/= 4 METS level of activity and all cardiac conditions are well managed.  The veteran may proceed with surgery with no additional cardiac testing or procedures."  Last dose Eliquis 05/16/2022.  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  Last seen by Dr. Carlis Abbott 02/14/2022, doing well, Plavix was discontinued at that time.   Preop labs reviewed, creatinine mildly elevated at 1.41, mild anemia with hemoglobin 12.4, labs otherwise unremarkable.  EKG 05/17/2022: Sinus rhythm with first-degree AV block.  Rate 72.  Minimal voltage criteria for LVH, may be normal variant.  PFTs 04/27/2022: FVC-%Pred-Pre % 67  FEV1-%Pred-Pre % 60  FEV1FVC-%Pred-Pre % 90  TLC % pred % 108  RV % pred % 178  DLCO unc % pred % 70  Rpt: View report  in Results Review for more information   PET scan 04/12/2022: IMPRESSION: 1. Hypermetabolic left upper lobe pulmonary nodule, suspicious for primary bronchogenic carcinoma. No other hypermetabolic nodules or evidence of metastatic disease. 2.  Indeterminate density left adrenal nodule is unchanged in size from 09/15/2021 CT and shows no significant hypermetabolic activity, likely an incidental adenoma. Attention on follow-up recommended. 3. See separate chest CT report for additional incidental thoracic findings. 4. Hypermetabolic left maxillary molar periodontal disease. Correlate clinically.   Nuclear stress 07/16/2018: Nuclear stress EF: 64%. There was no ST segment deviation noted during stress. No T wave inversion was noted during stress. The study is normal. This is a low risk study. The left ventricular ejection fraction is normal (55-65%).   TTE 06/25/2018: - Left ventricle: The cavity size was normal. Wall thickness was    normal. Systolic function was normal. The estimated ejection    fraction was in the range of 55% to 60%. Wall motion was normal;    there were no regional wall motion abnormalities. Doppler    parameters are consistent with abnormal left ventricular    relaxation (grade 1 diastolic dysfunction).  - Mitral valve: Mild focal calcification of the anterior leaflet.     [name] [facility]/Anesthesiology Phone [phone] 05/18/2022 10:01 AM

## 2022-05-19 ENCOUNTER — Encounter (HOSPITAL_COMMUNITY)
Admission: AD | Disposition: A | Discharge status: Home / Self Care | Payer: Self-pay | Source: Home / Self Care | Attending: Thoracic Surgery (Cardiothoracic Vascular Surgery)

## 2022-05-19 ENCOUNTER — Encounter (HOSPITAL_COMMUNITY): Payer: Self-pay

## 2022-05-19 ENCOUNTER — Inpatient Hospital Stay (HOSPITAL_COMMUNITY)
Admission: AD | Admit: 2022-05-19 | Discharge: 2022-05-23 | DRG: 164 | Disposition: A | Discharge status: Home / Self Care | Payer: No Typology Code available for payment source | Attending: Thoracic Surgery (Cardiothoracic Vascular Surgery) | Admitting: Thoracic Surgery (Cardiothoracic Vascular Surgery)

## 2022-05-19 ENCOUNTER — Ambulatory Visit (HOSPITAL_COMMUNITY): Payer: No Typology Code available for payment source

## 2022-05-19 ENCOUNTER — Ambulatory Visit (HOSPITAL_COMMUNITY): Payer: No Typology Code available for payment source | Admitting: Physician Assistant

## 2022-05-19 ENCOUNTER — Encounter (HOSPITAL_COMMUNITY): Payer: Self-pay | Admitting: Student

## 2022-05-19 ENCOUNTER — Ambulatory Visit (HOSPITAL_COMMUNITY): Payer: No Typology Code available for payment source | Admitting: Certified Registered"

## 2022-05-19 DIAGNOSIS — Z8673 Personal history of transient ischemic attack (TIA), and cerebral infarction without residual deficits: Secondary | ICD-10-CM | POA: Diagnosis not present

## 2022-05-19 DIAGNOSIS — Y838 Other surgical procedures as the cause of abnormal reaction of the patient, or of later complication, without mention of misadventure at the time of the procedure: Secondary | ICD-10-CM | POA: Diagnosis present

## 2022-05-19 DIAGNOSIS — Z981 Arthrodesis status: Secondary | ICD-10-CM

## 2022-05-19 DIAGNOSIS — I129 Hypertensive chronic kidney disease with stage 1 through stage 4 chronic kidney disease, or unspecified chronic kidney disease: Secondary | ICD-10-CM | POA: Diagnosis present

## 2022-05-19 DIAGNOSIS — T8182XA Emphysema (subcutaneous) resulting from a procedure, initial encounter: Secondary | ICD-10-CM | POA: Diagnosis present

## 2022-05-19 DIAGNOSIS — D630 Anemia in neoplastic disease: Secondary | ICD-10-CM

## 2022-05-19 DIAGNOSIS — C3412 Malignant neoplasm of upper lobe, left bronchus or lung: Principal | ICD-10-CM | POA: Diagnosis present

## 2022-05-19 DIAGNOSIS — R490 Dysphonia: Secondary | ICD-10-CM | POA: Diagnosis not present

## 2022-05-19 DIAGNOSIS — J9811 Atelectasis: Secondary | ICD-10-CM | POA: Diagnosis not present

## 2022-05-19 DIAGNOSIS — K219 Gastro-esophageal reflux disease without esophagitis: Secondary | ICD-10-CM | POA: Diagnosis present

## 2022-05-19 DIAGNOSIS — C3492 Malignant neoplasm of unspecified part of left bronchus or lung: Secondary | ICD-10-CM | POA: Diagnosis present

## 2022-05-19 DIAGNOSIS — F17210 Nicotine dependence, cigarettes, uncomplicated: Secondary | ICD-10-CM | POA: Diagnosis present

## 2022-05-19 DIAGNOSIS — R911 Solitary pulmonary nodule: Secondary | ICD-10-CM

## 2022-05-19 DIAGNOSIS — N182 Chronic kidney disease, stage 2 (mild): Secondary | ICD-10-CM | POA: Diagnosis present

## 2022-05-19 DIAGNOSIS — E785 Hyperlipidemia, unspecified: Secondary | ICD-10-CM | POA: Diagnosis present

## 2022-05-19 DIAGNOSIS — I4819 Other persistent atrial fibrillation: Secondary | ICD-10-CM | POA: Diagnosis present

## 2022-05-19 DIAGNOSIS — Z85828 Personal history of other malignant neoplasm of skin: Secondary | ICD-10-CM

## 2022-05-19 DIAGNOSIS — Z79899 Other long term (current) drug therapy: Secondary | ICD-10-CM | POA: Diagnosis not present

## 2022-05-19 DIAGNOSIS — Z7901 Long term (current) use of anticoagulants: Secondary | ICD-10-CM | POA: Diagnosis not present

## 2022-05-19 DIAGNOSIS — I459 Conduction disorder, unspecified: Secondary | ICD-10-CM | POA: Diagnosis present

## 2022-05-19 DIAGNOSIS — Z85820 Personal history of malignant melanoma of skin: Secondary | ICD-10-CM | POA: Diagnosis not present

## 2022-05-19 DIAGNOSIS — J9382 Other air leak: Secondary | ICD-10-CM | POA: Diagnosis not present

## 2022-05-19 DIAGNOSIS — Z7982 Long term (current) use of aspirin: Secondary | ICD-10-CM | POA: Diagnosis not present

## 2022-05-19 DIAGNOSIS — D62 Acute posthemorrhagic anemia: Secondary | ICD-10-CM | POA: Diagnosis not present

## 2022-05-19 DIAGNOSIS — I2510 Atherosclerotic heart disease of native coronary artery without angina pectoris: Secondary | ICD-10-CM | POA: Diagnosis present

## 2022-05-19 DIAGNOSIS — I10 Essential (primary) hypertension: Secondary | ICD-10-CM | POA: Diagnosis not present

## 2022-05-19 DIAGNOSIS — Z801 Family history of malignant neoplasm of trachea, bronchus and lung: Secondary | ICD-10-CM | POA: Diagnosis not present

## 2022-05-19 DIAGNOSIS — I739 Peripheral vascular disease, unspecified: Secondary | ICD-10-CM | POA: Diagnosis present

## 2022-05-19 DIAGNOSIS — J449 Chronic obstructive pulmonary disease, unspecified: Secondary | ICD-10-CM | POA: Diagnosis present

## 2022-05-19 DIAGNOSIS — I700 Atherosclerosis of aorta: Secondary | ICD-10-CM | POA: Diagnosis present

## 2022-05-19 HISTORY — PX: BRONCHIAL NEEDLE ASPIRATION BIOPSY: SHX5106

## 2022-05-19 HISTORY — PX: VIDEO BRONCHOSCOPY WITH RADIAL ENDOBRONCHIAL ULTRASOUND: SHX6849

## 2022-05-19 HISTORY — PX: FIDUCIAL MARKER PLACEMENT: SHX6858

## 2022-05-19 HISTORY — PX: BRONCHIAL BRUSHINGS: SHX5108

## 2022-05-19 HISTORY — PX: INTERCOSTAL NERVE BLOCK: SHX5021

## 2022-05-19 HISTORY — PX: LYMPH NODE DISSECTION: SHX5087

## 2022-05-19 LAB — SARS CORONAVIRUS 2 (TAT 6-24 HRS): SARS Coronavirus 2: NEGATIVE

## 2022-05-19 SURGERY — BRONCHOSCOPY, WITH BIOPSY USING ELECTROMAGNETIC NAVIGATION
Anesthesia: General

## 2022-05-19 SURGERY — WEDGE RESECTION, LUNG, ROBOT-ASSISTED, THORACOSCOPIC
Anesthesia: General | Site: Chest | Laterality: Left

## 2022-05-19 MED ORDER — BISACODYL 5 MG PO TBEC
10.0000 mg | DELAYED_RELEASE_TABLET | Freq: Every day | ORAL | Status: DC
  Administered 2022-05-20 – 2022-05-22 (×3): 10 mg via ORAL
  Filled 2022-05-19 (×3): qty 2

## 2022-05-19 MED ORDER — CHLORHEXIDINE GLUCONATE 0.12 % MT SOLN
OROMUCOSAL | Status: AC
  Administered 2022-05-19: 15 mL
  Filled 2022-05-19: qty 15

## 2022-05-19 MED ORDER — ATORVASTATIN CALCIUM 40 MG PO TABS
40.0000 mg | ORAL_TABLET | Freq: Every evening | ORAL | Status: DC
  Administered 2022-05-19 – 2022-05-22 (×4): 40 mg via ORAL
  Filled 2022-05-19 (×4): qty 1

## 2022-05-19 MED ORDER — KETOROLAC TROMETHAMINE 15 MG/ML IJ SOLN
INTRAMUSCULAR | Status: AC
  Filled 2022-05-19: qty 1

## 2022-05-19 MED ORDER — FERROUS SULFATE 325 (65 FE) MG PO TABS
325.0000 mg | ORAL_TABLET | Freq: Every day | ORAL | Status: DC
  Administered 2022-05-20 – 2022-05-23 (×4): 325 mg via ORAL
  Filled 2022-05-19 (×4): qty 1

## 2022-05-19 MED ORDER — LACTATED RINGERS IV SOLN: INTRAVENOUS | Status: DC | PRN

## 2022-05-19 MED ORDER — BUPIVACAINE HCL (PF) 0.5 % IJ SOLN
INTRAMUSCULAR | Status: AC
  Filled 2022-05-19: qty 30

## 2022-05-19 MED ORDER — FINASTERIDE 5 MG PO TABS
5.0000 mg | ORAL_TABLET | Freq: Every day | ORAL | Status: DC
  Administered 2022-05-20 – 2022-05-22 (×3): 5 mg via ORAL
  Filled 2022-05-19 (×3): qty 1

## 2022-05-19 MED ORDER — PANTOPRAZOLE SODIUM 40 MG PO TBEC
40.0000 mg | DELAYED_RELEASE_TABLET | Freq: Every day | ORAL | Status: DC
  Administered 2022-05-20 – 2022-05-22 (×3): 40 mg via ORAL
  Filled 2022-05-19 (×3): qty 1

## 2022-05-19 MED ORDER — HEMOSTATIC AGENTS (NO CHARGE) OPTIME
TOPICAL | Status: DC | PRN
  Administered 2022-05-19 (×2): 1 via TOPICAL

## 2022-05-19 MED ORDER — ARFORMOTEROL TARTRATE 15 MCG/2ML IN NEBU
15.0000 ug | INHALATION_SOLUTION | Freq: Two times a day (BID) | RESPIRATORY_TRACT | Status: DC
  Administered 2022-05-20 – 2022-05-23 (×6): 15 ug via RESPIRATORY_TRACT
  Filled 2022-05-19 (×7): qty 2

## 2022-05-19 MED ORDER — ROCURONIUM BROMIDE 10 MG/ML (PF) SYRINGE
PREFILLED_SYRINGE | INTRAVENOUS | Status: DC | PRN
  Administered 2022-05-19: 20 mg via INTRAVENOUS
  Administered 2022-05-19: 30 mg via INTRAVENOUS
  Administered 2022-05-19: 10 mg via INTRAVENOUS
  Administered 2022-05-19: 80 mg via INTRAVENOUS

## 2022-05-19 MED ORDER — PHENYLEPHRINE HCL-NACL 20-0.9 MG/250ML-% IV SOLN
INTRAVENOUS | Status: DC | PRN
  Administered 2022-05-19: 25 ug/min via INTRAVENOUS

## 2022-05-19 MED ORDER — PROPOFOL 10 MG/ML IV BOLUS
INTRAVENOUS | Status: DC | PRN
  Administered 2022-05-19: 120 mg via INTRAVENOUS
  Administered 2022-05-19: 30 mg via INTRAVENOUS
  Administered 2022-05-19: 20 mg via INTRAVENOUS
  Administered 2022-05-19: 30 mg via INTRAVENOUS

## 2022-05-19 MED ORDER — DEXAMETHASONE SODIUM PHOSPHATE 10 MG/ML IJ SOLN
INTRAMUSCULAR | Status: AC
  Filled 2022-05-19: qty 1

## 2022-05-19 MED ORDER — OXYCODONE HCL 5 MG PO TABS
5.0000 mg | ORAL_TABLET | ORAL | Status: DC | PRN
  Administered 2022-05-20 (×2): 5 mg via ORAL
  Administered 2022-05-21 (×3): 10 mg via ORAL
  Administered 2022-05-21 – 2022-05-22 (×2): 5 mg via ORAL
  Filled 2022-05-19 (×2): qty 1
  Filled 2022-05-19: qty 2
  Filled 2022-05-19: qty 1
  Filled 2022-05-19 (×2): qty 2
  Filled 2022-05-19: qty 1

## 2022-05-19 MED ORDER — CEFAZOLIN SODIUM-DEXTROSE 2-4 GM/100ML-% IV SOLN
2.0000 g | Freq: Three times a day (TID) | INTRAVENOUS | Status: AC
  Administered 2022-05-19 – 2022-05-20 (×2): 2 g via INTRAVENOUS
  Filled 2022-05-19 (×2): qty 100

## 2022-05-19 MED ORDER — ROCURONIUM BROMIDE 10 MG/ML (PF) SYRINGE
PREFILLED_SYRINGE | INTRAVENOUS | Status: AC
  Filled 2022-05-19: qty 20

## 2022-05-19 MED ORDER — METHYLENE BLUE 1 % INJ SOLN
INTRAVENOUS | Status: DC | PRN
  Administered 2022-05-19: .75 mL

## 2022-05-19 MED ORDER — BUPIVACAINE LIPOSOME 1.3 % IJ SUSP
INTRAMUSCULAR | Status: AC
  Filled 2022-05-19: qty 20

## 2022-05-19 MED ORDER — SUGAMMADEX SODIUM 200 MG/2ML IV SOLN
INTRAVENOUS | Status: DC | PRN
  Administered 2022-05-19: 200 mg via INTRAVENOUS

## 2022-05-19 MED ORDER — PHENYLEPHRINE 80 MCG/ML (10ML) SYRINGE FOR IV PUSH (FOR BLOOD PRESSURE SUPPORT)
PREFILLED_SYRINGE | INTRAVENOUS | Status: AC
Start: 2022-05-19 — End: ?
  Filled 2022-05-19: qty 10

## 2022-05-19 MED ORDER — TRAMADOL HCL 50 MG PO TABS: 50.0000 mg | ORAL_TABLET | Freq: Four times a day (QID) | ORAL | Status: DC | PRN

## 2022-05-19 MED ORDER — DEXAMETHASONE SODIUM PHOSPHATE 10 MG/ML IJ SOLN
INTRAMUSCULAR | Status: DC | PRN
  Administered 2022-05-19: 5 mg via INTRAVENOUS

## 2022-05-19 MED ORDER — FENTANYL CITRATE (PF) 250 MCG/5ML IJ SOLN
INTRAMUSCULAR | Status: AC
  Filled 2022-05-19: qty 5

## 2022-05-19 MED ORDER — ENOXAPARIN SODIUM 40 MG/0.4ML IJ SOSY
40.0000 mg | PREFILLED_SYRINGE | INTRAMUSCULAR | Status: DC
  Administered 2022-05-20 – 2022-05-21 (×2): 40 mg via SUBCUTANEOUS
  Filled 2022-05-19 (×2): qty 0.4

## 2022-05-19 MED ORDER — LIDOCAINE 2% (20 MG/ML) 5 ML SYRINGE
INTRAMUSCULAR | Status: AC
  Filled 2022-05-19: qty 5

## 2022-05-19 MED ORDER — METOPROLOL TARTRATE 50 MG PO TABS
75.0000 mg | ORAL_TABLET | Freq: Two times a day (BID) | ORAL | Status: DC
  Administered 2022-05-20 – 2022-05-22 (×6): 75 mg via ORAL
  Filled 2022-05-19 (×6): qty 1

## 2022-05-19 MED ORDER — SODIUM CHLORIDE 0.9 % IV SOLN
INTRAVENOUS | Status: AC | PRN
  Administered 2022-05-19: 1000 mL

## 2022-05-19 MED ORDER — FENTANYL CITRATE (PF) 250 MCG/5ML IJ SOLN
INTRAMUSCULAR | Status: DC | PRN
Start: 2022-05-19 — End: 2022-05-19
  Administered 2022-05-19: 50 ug via INTRAVENOUS
  Administered 2022-05-19 (×2): 25 ug via INTRAVENOUS
  Administered 2022-05-19: 50 ug via INTRAVENOUS
  Administered 2022-05-19: 100 ug via INTRAVENOUS
  Administered 2022-05-19: 25 ug via INTRAVENOUS
  Administered 2022-05-19 (×2): 50 ug via INTRAVENOUS
  Administered 2022-05-19: 25 ug via INTRAVENOUS

## 2022-05-19 MED ORDER — CELECOXIB 200 MG PO CAPS: 200.0000 mg | ORAL_CAPSULE | Freq: Once | ORAL | Status: DC

## 2022-05-19 MED ORDER — PROPOFOL 10 MG/ML IV BOLUS
INTRAVENOUS | Status: AC
  Filled 2022-05-19: qty 20

## 2022-05-19 MED ORDER — FENTANYL CITRATE PF 50 MCG/ML IJ SOSY: 25.0000 ug | PREFILLED_SYRINGE | INTRAMUSCULAR | Status: DC | PRN

## 2022-05-19 MED ORDER — SENNOSIDES-DOCUSATE SODIUM 8.6-50 MG PO TABS
1.0000 | ORAL_TABLET | Freq: Every day | ORAL | Status: DC
  Administered 2022-05-19 – 2022-05-22 (×4): 1 via ORAL
  Filled 2022-05-19 (×4): qty 1

## 2022-05-19 MED ORDER — LIDOCAINE 2% (20 MG/ML) 5 ML SYRINGE
INTRAMUSCULAR | Status: DC | PRN
  Administered 2022-05-19: 100 mg via INTRAVENOUS

## 2022-05-19 MED ORDER — DOFETILIDE 500 MCG PO CAPS
500.0000 ug | ORAL_CAPSULE | Freq: Two times a day (BID) | ORAL | Status: DC
Start: 2022-05-20 — End: 2022-05-23
  Administered 2022-05-20 – 2022-05-23 (×7): 500 ug via ORAL
  Filled 2022-05-19 (×7): qty 1

## 2022-05-19 MED ORDER — EPHEDRINE SULFATE-NACL 50-0.9 MG/10ML-% IV SOSY
PREFILLED_SYRINGE | INTRAVENOUS | Status: DC | PRN
  Administered 2022-05-19 (×2): 5 mg via INTRAVENOUS

## 2022-05-19 MED ORDER — SODIUM CHLORIDE FLUSH 0.9 % IV SOLN
INTRAVENOUS | Status: DC | PRN
  Administered 2022-05-19: 100 mL

## 2022-05-19 MED ORDER — 0.9 % SODIUM CHLORIDE (POUR BTL) OPTIME
TOPICAL | Status: DC | PRN
  Administered 2022-05-19: 1000 mL

## 2022-05-19 MED ORDER — SODIUM CHLORIDE 0.9 % IV SOLN: INTRAVENOUS | Status: DC

## 2022-05-19 MED ORDER — PHENYLEPHRINE 80 MCG/ML (10ML) SYRINGE FOR IV PUSH (FOR BLOOD PRESSURE SUPPORT)
PREFILLED_SYRINGE | INTRAVENOUS | Status: DC | PRN
  Administered 2022-05-19 (×4): 80 ug via INTRAVENOUS

## 2022-05-19 MED ORDER — ONDANSETRON HCL 4 MG/2ML IJ SOLN
INTRAMUSCULAR | Status: DC | PRN
  Administered 2022-05-19: 4 mg via INTRAVENOUS

## 2022-05-19 MED ORDER — CEFAZOLIN SODIUM-DEXTROSE 2-4 GM/100ML-% IV SOLN
2.0000 g | INTRAVENOUS | Status: AC
  Administered 2022-05-19: 2 g via INTRAVENOUS
  Filled 2022-05-19 (×2): qty 100

## 2022-05-19 MED ORDER — ASPIRIN 81 MG PO TBEC
81.0000 mg | DELAYED_RELEASE_TABLET | Freq: Every day | ORAL | Status: DC
  Administered 2022-05-20 – 2022-05-22 (×3): 81 mg via ORAL
  Filled 2022-05-19 (×3): qty 1

## 2022-05-19 MED ORDER — KETOROLAC TROMETHAMINE 15 MG/ML IJ SOLN
15.0000 mg | Freq: Four times a day (QID) | INTRAMUSCULAR | Status: DC | PRN
Start: 2022-05-19 — End: 2022-05-20
  Administered 2022-05-19 – 2022-05-20 (×2): 15 mg via INTRAVENOUS
  Filled 2022-05-19: qty 1

## 2022-05-19 MED ORDER — UMECLIDINIUM BROMIDE 62.5 MCG/ACT IN AEPB
1.0000 | INHALATION_SPRAY | Freq: Every day | RESPIRATORY_TRACT | Status: DC
  Administered 2022-05-20 – 2022-05-23 (×4): 1 via RESPIRATORY_TRACT
  Filled 2022-05-19: qty 7

## 2022-05-19 MED ORDER — LUNG SURGERY BOOK
Freq: Once | Status: AC
  Filled 2022-05-19: qty 1

## 2022-05-19 MED ORDER — ACETAMINOPHEN 500 MG PO TABS: 1000.0000 mg | ORAL_TABLET | Freq: Once | ORAL | Status: DC

## 2022-05-19 MED ORDER — MIDAZOLAM HCL 2 MG/2ML IJ SOLN
INTRAMUSCULAR | Status: AC
  Filled 2022-05-19: qty 2

## 2022-05-19 MED ORDER — ACETAMINOPHEN 160 MG/5ML PO SOLN: 1000.0000 mg | Freq: Four times a day (QID) | ORAL | Status: DC

## 2022-05-19 MED ORDER — PANTOPRAZOLE SODIUM 40 MG PO TBEC: 40.0000 mg | DELAYED_RELEASE_TABLET | Freq: Every day | ORAL | Status: DC

## 2022-05-19 MED ORDER — ONDANSETRON HCL 4 MG/2ML IJ SOLN: 4.0000 mg | Freq: Four times a day (QID) | INTRAMUSCULAR | Status: DC | PRN

## 2022-05-19 MED ORDER — EPHEDRINE 5 MG/ML INJ
INTRAVENOUS | Status: AC
  Filled 2022-05-19: qty 5

## 2022-05-19 MED ORDER — ONDANSETRON HCL 4 MG/2ML IJ SOLN
INTRAMUSCULAR | Status: AC
  Filled 2022-05-19: qty 2

## 2022-05-19 MED ORDER — FENTANYL CITRATE (PF) 100 MCG/2ML IJ SOLN
25.0000 ug | INTRAMUSCULAR | Status: DC | PRN
  Administered 2022-05-19: 25 ug via INTRAVENOUS
  Administered 2022-05-19: 50 ug via INTRAVENOUS

## 2022-05-19 MED ORDER — AMISULPRIDE (ANTIEMETIC) 5 MG/2ML IV SOLN: 10.0000 mg | Freq: Once | INTRAVENOUS | Status: DC | PRN

## 2022-05-19 MED ORDER — FENTANYL CITRATE (PF) 100 MCG/2ML IJ SOLN
INTRAMUSCULAR | Status: AC
  Filled 2022-05-19: qty 2

## 2022-05-19 MED ORDER — ACETAMINOPHEN 500 MG PO TABS
1000.0000 mg | ORAL_TABLET | Freq: Four times a day (QID) | ORAL | Status: DC
  Administered 2022-05-19 – 2022-05-23 (×13): 1000 mg via ORAL
  Filled 2022-05-19 (×13): qty 2

## 2022-05-19 SURGICAL SUPPLY — 89 items
BLADE CLIPPER SURG (BLADE) ×1 IMPLANT
CANISTER SUCT 3000ML PPV (MISCELLANEOUS) ×2 IMPLANT
CANNULA REDUC XI 12-8 STAPL (CANNULA) ×2
CANNULA REDUCER 12-8 DVNC XI (CANNULA) ×2 IMPLANT
CNTNR URN SCR LID CUP LEK RST (MISCELLANEOUS) ×5 IMPLANT
CONT SPEC 4OZ STRL OR WHT (MISCELLANEOUS) ×20
DEFOGGER SCOPE WARMER CLEARIFY (MISCELLANEOUS) ×1 IMPLANT
DERMABOND ADVANCED (GAUZE/BANDAGES/DRESSINGS) ×1
DERMABOND ADVANCED .7 DNX12 (GAUZE/BANDAGES/DRESSINGS) ×1 IMPLANT
DRAPE ARM DVNC X/XI (DISPOSABLE) ×4 IMPLANT
DRAPE COLUMN DVNC XI (DISPOSABLE) ×1 IMPLANT
DRAPE CV SPLIT W-CLR ANES SCRN (DRAPES) ×1 IMPLANT
DRAPE DA VINCI XI ARM (DISPOSABLE) ×4
DRAPE DA VINCI XI COLUMN (DISPOSABLE) ×1
DRAPE HALF SHEET 40X57 (DRAPES) ×1 IMPLANT
DRAPE INCISE IOBAN 66X45 STRL (DRAPES) IMPLANT
DRAPE ORTHO SPLIT 77X108 STRL (DRAPES) ×1
DRAPE SURG ORHT 6 SPLT 77X108 (DRAPES) ×1 IMPLANT
ELECT BLADE 4.0 EZ CLEAN MEGAD (MISCELLANEOUS) ×1
ELECT REM PT RETURN 9FT ADLT (ELECTROSURGICAL) ×1
ELECTRODE BLDE 4.0 EZ CLN MEGD (MISCELLANEOUS) IMPLANT
ELECTRODE REM PT RTRN 9FT ADLT (ELECTROSURGICAL) ×1 IMPLANT
GAUZE KITTNER 4X5 RF (MISCELLANEOUS) ×2 IMPLANT
GAUZE SPONGE 4X4 12PLY STRL (GAUZE/BANDAGES/DRESSINGS) ×1 IMPLANT
GLOVE SURG MICRO LTX SZ7.5 (GLOVE) ×2 IMPLANT
GOWN STRL REUS W/ TWL LRG LVL3 (GOWN DISPOSABLE) ×2 IMPLANT
GOWN STRL REUS W/ TWL XL LVL3 (GOWN DISPOSABLE) ×2 IMPLANT
GOWN STRL REUS W/TWL 2XL LVL3 (GOWN DISPOSABLE) ×1 IMPLANT
GOWN STRL REUS W/TWL LRG LVL3 (GOWN DISPOSABLE) ×3
GOWN STRL REUS W/TWL XL LVL3 (GOWN DISPOSABLE) ×2
HEMOSTAT SURGICEL 2X14 (HEMOSTASIS) ×3 IMPLANT
IRRIGATION STRYKERFLOW (MISCELLANEOUS) ×1 IMPLANT
IRRIGATOR STRYKERFLOW (MISCELLANEOUS) ×1
KIT BASIN OR (CUSTOM PROCEDURE TRAY) ×1 IMPLANT
KIT TURNOVER KIT B (KITS) ×1 IMPLANT
NDL HYPO 25GX1X1/2 BEV (NEEDLE) ×1 IMPLANT
NDL SPNL 22GX3.5 QUINCKE BK (NEEDLE) ×1 IMPLANT
NEEDLE HYPO 25GX1X1/2 BEV (NEEDLE) ×1 IMPLANT
NEEDLE SPNL 22GX3.5 QUINCKE BK (NEEDLE) ×1 IMPLANT
NS IRRIG 1000ML POUR BTL (IV SOLUTION) ×1 IMPLANT
PACK CHEST (CUSTOM PROCEDURE TRAY) ×1 IMPLANT
PAD ARMBOARD 7.5X6 YLW CONV (MISCELLANEOUS) ×2 IMPLANT
PORT ACCESS TROCAR AIRSEAL 12 (TROCAR) ×1 IMPLANT
PORT ACCESS TROCAR AIRSEAL 5M (TROCAR) ×1
RELOAD EGIA 45 MED/THCK PURPLE (STAPLE) IMPLANT
RELOAD STAPLE 45 2.5 WHT DVNC (STAPLE) IMPLANT
RELOAD STAPLE 45 3.5 BLU DVNC (STAPLE) IMPLANT
RELOAD STAPLE 45 4.3 GRN DVNC (STAPLE) IMPLANT
RELOAD STAPLE 45 4.6 BLK DVNC (STAPLE) IMPLANT
RELOAD STAPLER 2.5X45 WHT DVNC (STAPLE) ×7 IMPLANT
RELOAD STAPLER 3.5X45 BLU DVNC (STAPLE) ×3 IMPLANT
RELOAD STAPLER 4.3X45 GRN DVNC (STAPLE) ×2 IMPLANT
RELOAD STAPLER 45 4.6 BLK DVNC (STAPLE) ×1 IMPLANT
SEAL CANN UNIV 5-8 DVNC XI (MISCELLANEOUS) ×2 IMPLANT
SEAL XI 5MM-8MM UNIVERSAL (MISCELLANEOUS) ×2
SEALANT PROGEL (MISCELLANEOUS) IMPLANT
SET TRI-LUMEN FLTR TB AIRSEAL (TUBING) ×1 IMPLANT
SOLUTION ELECTROLUBE (MISCELLANEOUS) ×1 IMPLANT
SPONGE TONSIL TAPE 1 RFD (DISPOSABLE) IMPLANT
STAPLER 45 SUREFORM CVD (STAPLE) ×1
STAPLER 45 SUREFORM CVD DVNC (STAPLE) IMPLANT
STAPLER CANNULA SEAL DVNC XI (STAPLE) ×2 IMPLANT
STAPLER CANNULA SEAL XI (STAPLE) ×2
STAPLER ENDO GIA 12 SHRT THIN (STAPLE) IMPLANT
STAPLER ENDO GIA 12MM SHORT (STAPLE) ×2 IMPLANT
STAPLER RELOAD 2.5X45 WHITE (STAPLE) ×7
STAPLER RELOAD 2.5X45 WHT DVNC (STAPLE) ×7
STAPLER RELOAD 3.5X45 BLU DVNC (STAPLE) ×3
STAPLER RELOAD 3.5X45 BLUE (STAPLE) ×3
STAPLER RELOAD 4.3X45 GREEN (STAPLE) ×2
STAPLER RELOAD 4.3X45 GRN DVNC (STAPLE) ×2
STAPLER RELOAD 45 4.6 BLK (STAPLE) ×1
STAPLER RELOAD 45 4.6 BLK DVNC (STAPLE) ×1
SUT PDS AB 3-0 SH 27 (SUTURE) IMPLANT
SUT SILK  1 MH (SUTURE) ×2
SUT SILK 1 MH (SUTURE) ×2 IMPLANT
SUT SILK 2 0 SH (SUTURE) IMPLANT
SUT VIC AB 3-0 X1 27 (SUTURE) ×2 IMPLANT
SUT VICRYL 0 TIES 12 18 (SUTURE) ×1 IMPLANT
SUT VICRYL 0 UR6 27IN ABS (SUTURE) ×2 IMPLANT
SUT VLOC 180 2-0 9IN GS21 (SUTURE) IMPLANT
SYR 20ML LL LF (SYRINGE) ×2 IMPLANT
SYSTEM RETRIEVAL ANCHOR 15 (MISCELLANEOUS) IMPLANT
SYSTEM SAHARA CHEST DRAIN ATS (WOUND CARE) ×1 IMPLANT
TAPE CLOTH 4X10 WHT NS (GAUZE/BANDAGES/DRESSINGS) ×1 IMPLANT
TAPE CLOTH SURG 6X10 WHT LF (GAUZE/BANDAGES/DRESSINGS) IMPLANT
TOWEL GREEN STERILE (TOWEL DISPOSABLE) ×2 IMPLANT
TRAY FOLEY MTR SLVR 16FR STAT (SET/KITS/TRAYS/PACK) ×1 IMPLANT
WATER STERILE IRR 1000ML POUR (IV SOLUTION) ×1 IMPLANT

## 2022-05-19 NOTE — Anesthesia Procedure Notes (Signed)
Procedure Name: Intubation Date/Time: 05/19/2022 7:49 AM  Performed by: Gaylene Brooks, CRNAPre-anesthesia Checklist: Patient identified, Emergency Drugs available, Suction available and Patient being monitored Patient Re-evaluated:Patient Re-evaluated prior to induction Oxygen Delivery Method: Circle System Utilized Preoxygenation: Pre-oxygenation with 100% oxygen Induction Type: IV induction Ventilation: Mask ventilation without difficulty and Oral airway inserted - appropriate to patient size Laryngoscope Size: Sabra Heck and 2 Grade View: Grade II Tube type: Oral Tube size: 8.5 mm Number of attempts: 1 Airway Equipment and Method: Stylet and Oral airway Placement Confirmation: ETT inserted through vocal cords under direct vision, positive ETCO2 and breath sounds checked- equal and bilateral Secured at: 22 cm Tube secured with: Tape Dental Injury: Teeth and Oropharynx as per pre-operative assessment

## 2022-05-19 NOTE — Op Note (Signed)
NAME: Samuel Ferrell RECORD NO: [id number] ACCOUNT NO: [id number] DATE OF BIRTH: 07-May-1946 FACILITY: [facility] LOCATION: [facility] PHYSICIAN: Revonda Standard. 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  POSTOPERATIVE DIAGNOSIS:  Adenocarcinoma of left upper lobe, clinical stage IA (T1, N0).  PROCEDURE:   Xi robotic-assisted left upper lobectomy,  Lymph node dissection and  Intercostal nerve blocks, levels 3 through 10.  SURGEON:  Revonda Standard. Roxan Hockey, MD  ASSISTANT:  Lars Pinks, PA-C.  ANESTHESIA:  General.  FINDINGS:  Nodule visible in superior aspect of the lingula, segmentectomy not feasible with adequate margin.  Frozen section of nodule showed adenocarcinoma.  Bronchial margin free of tumor.  CLINICAL NOTE: Samuel Ferrell is a 76 year old man with a history of tobacco abuse and COPD.  He was found to have a nodule on a low dose CT for lung cancer screening.  Followup scan showed the nodule had increased in size slightly.  On PET/CT, it was  hypermetabolic.  He was referred to Dr. Verlee Monte, who did a navigational bronchoscopy, which was not diagnostic.  He was advised to undergo surgical resection.  The plan was to do a navigational bronchoscopy to resample and mark the lesion prior to surgical  resection.  The indications, risks, benefits, and alternatives were discussed in detail with the patient.  He understood and accepted the risks and agreed to proceed.  OPERATIVE NOTE:  Samuel Ferrell was brought to the preoperative holding area on 05/19/2022.  Anesthesia established intravenous access and placed an arterial blood pressure monitoring line.  He was taken to the endoscopy lab and Dr. Leslye Peer  performed robotic bronchoscopy.  Needle aspirations and brushings were performed from the lesion and then it was marked with a solution containing indocyanine green and  methylene blue.  He was intubated and under general anesthesia during the procedure.  The  patient was then taken to the operating room where he was reintubated with a double lumen endotracheal tube.  A Foley catheter was placed.  Intravenous antibiotics were administered.  Sequential compression devices were placed on the calves for DVT  prophylaxis.  He was placed in a right lateral decubitus position.  A Bair Hugger was placed for active warming.  The left chest was prepped and draped in the usual sterile fashion.  By this point, the brushings had been noted to show adenocarcinoma.   Single lung ventilation of the right lung was initiated and was tolerated well throughout the procedure.  A timeout was performed.  A solution containing 20 mL of liposomal bupivacaine, 30 mL of 0.5% bupivacaine and 50 mL of saline was prepared.  This  was used for local at the incision sites as well as for the intercostal nerve blocks.  An incision was made in the eighth interspace in approximately mid axillary line.  An 8 mm port was inserted.  The thoracoscope was advanced and it was clear that  there was parenchymal entry.  A second incision was made posterolateral in the eighth interspace.  The chest was again bluntly entered using a hemostat and an 8 mm port was inserted.  This port was intrapleural.  Carbon dioxide was insufflated per  protocol.  A 12 mm port was placed in the eighth interspace anteriorly.  Intercostal nerve blocks then were performed from the third to the tenth interspace by injecting 10 mL of the bupivacaine solution into a subpleural plane  at each level.  A 12 mm  AirSeal port was placed posteriorly and an 8 mm robotic port was placed in the eighth interspace posteriorly for the retraction arm.  The robot was deployed.  The camera arm was docked, targeting was performed.  The remaining arms were docked.  The  robotic instruments were inserted with thoracoscopic visualization.  Activating the  Firefly setting on the robotic console showed the tumor clearly visible in the lateral left upper lobe relatively superior in the lingular segment.  The lower lobe was retracted superiorly and the inferior pulmonary ligament was divided with bipolar cautery.  Lymph node dissection was begun.  Level 9 node was removed.  The lower lobe then was retracted anteriorly.  The pleural reflection was divided  at the hilum posteriorly.  Multiple level 7 nodes were identified and removed.  The pleural reflection then was divided working more superiorly and the surrounding tissue was dissected off the main pulmonary artery posteriorly and a branch was noted and  a node was removed from around that branch as well.  The pleural reflection was divided superiorly and level 10 and 5 nodes were removed.  There was some mild bleeding in that area and Surgicel was placed in the area with good control.  Pleural  reflection was divided anteriorly. The phrenic nerve was identified and care was taken not to use cautery in the vicinity of the phrenic nerve.  The anterior portion of the fissure was inspected and was essentially complete.  The pleura overlying the  pulmonary artery was incised with bipolar cautery and the fissure was completed anteriorly with bipolar cautery.  The lingular segmental artery branches were identified as were the lingular vein branches.  An attempt was made to see if a lingular segmentectomy would be adequate.  The lingular vein branches were encircled and were divided with the robotic stapler.  Next, the lingular arterial branches, which arose in close proximity were divided with a single firing of the robotic stapler. The dissection was  carried along the left upper lobe bronchus.  The lingular segmental bronchus was identified.  It was encircled and divided with the stapler.  The parenchymal division was begun; however, as I proceeded, it was clear that there was not going to be an  adequate margin on  the lesion without potentially compromising the remainder of the left upper lobe.  The decision was made to proceed with lobectomy as discussed with the patient preoperatively.  The fissure was completed posteriorly with sequential  firings of the robotic stapler.  The remainder of the superior pulmonary vein branches were encircled and divided with a stapler.  The posterior ascending arterial branch was dissected out separately and divided and then working from an anterior approach  the anterior apical branch was divided as well.  The robotic stapler, then was placed across the left upper lobe bronchus at its origin and closed.  A test inflation showed good aeration of the lower lobe.  Stapler was fired transecting the bronchus.   The vessel loop and sponges that had been placed during the dissection were removed.  Because of the size of the lobe, which was relatively large it was felt best to deliver the lobe into the endoscopic retrieval bag after the robot was removed.  The  staple lines were inspected. The robotic arms were removed.  The robot was undocked.  The anterior eighth interspace incision was lengthened to approximately 3 cm.  A 15 mm endoscopic retrieval bag was placed  into the chest and the upper lobe was  manipulated into the bag. It was removed and sent for frozen section of the nodule and the bronchial margin.  The nodule was confirmed to be an adenocarcinoma as suggested by the brushing. The bronchial margin was negative for tumor.  The chest was  copiously irrigated with warm saline.  A test inflation showed some air leakage from the staple line in the fissure.  Progel was applied to that area.  A Covidien stapler was used to staple the small tear in the lower lobe that occurred with the initial  port placement.  A 28-French Blake drain was placed through the original port incision directed to the apex.  It was secured with a #1 silk suture.  Dual lung ventilation was resumed.  The  remaining incisions were closed in standard fashion.  The chest  tube was placed to a Pleur-Evac on waterseal.  The patient was placed back in a supine position.  He was extubated in the operating room and taken to postanesthetic care unit in good condition.  All sponge, needle and instrument counts were correct at  the end of the procedure.  Experienced assistance was necessary for this case due to surgical complexity.  Lars Pinks assisted with port placement, suctioning, specimen retrieval, and wound closure.     SUJ D: 05/19/2022 4:58:12 pm T: 05/19/2022 10:28:00 pm  JOB: [id number]/ [id number]

## 2022-05-19 NOTE — Transfer of Care (Signed)
Immediate Anesthesia Transfer of Care Note  Patient: Samuel Ferrell  Procedure(s) Performed: ROBOTIC ASSISTED NAVIGATIONAL BRONCHOSCOPY dye marking VIDEO BRONCHOSCOPY WITH RADIAL ENDOBRONCHIAL ULTRASOUND BRONCHIAL NEEDLE ASPIRATION BIOPSIES BRONCHIAL BRUSHINGS  Patient Location: PACU  Anesthesia Type:General  Level of Consciousness: awake, drowsy and patient cooperative  Airway & Oxygen Therapy: Patient Spontanous Breathing  Post-op Assessment: Report given to RN, Post -op Vital signs reviewed and stable and Patient moving all extremities X 4  Post vital signs: Reviewed and stable  Last Vitals:  Vitals Value Taken Time  BP 163/79 05/19/22 1222  Temp    Pulse 76 05/19/22 1225  Resp 27 05/19/22 1225  SpO2 93 % 05/19/22 1225  Vitals shown include unvalidated device data.  Last Pain:  Vitals:   05/19/22 0617  TempSrc:   PainSc: 0-No pain         Complications: No notable events documented.

## 2022-05-19 NOTE — H&P (Signed)
Samuel Ferrell is an 76 y.o. male.   Chief Complaint: presenting for RATS wedge resection of pulmonary nodule, robotic bronchoscopy with dye-marking and TBNA, brushing  HPI:  77yM with history of AF s/p ablation, COPD, CVA, etoh use, TIA, HTN referred from [place] for pulmonary nodule  Doing well, has held eliquis since Tuesday, off of aspirin since then as well.    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[place])    COPD (chronic obstructive pulmonary disease) ([place])    COVID    mild case   CVA (cerebral infarction)    GERD (gastroesophageal reflux disease)    Heart murmur    Hyperlipidemia    Hypertension    Palpitations    Pneumonia    1970's   Skin cancer--melanoma    Stroke [facility])    TIA    Past Surgical History:  Procedure Laterality Date   ANTERIOR CERVICAL DECOMP/DISCECTOMY FUSION N/A 07/24/2018   Procedure: ANTERIOR CERVICAL DECOMPRESSION/DISCECTOMY FUSION, INTERBODY PROSTHESIS, PLATE SCREWS CERVICAL FIVE- CERVICAL SIX;  Surgeon: Newman Pies, MD;  Location: [place];  Service: Neurosurgery;  Laterality: N/A;  ANTERIOR CERVICAL DECOMPRESSION/DISCECTOMY FUSION, INTERBODY PROSTHESIS, PLATE SCREWS CERVICAL FIVE- CERVICAL SIX   APPENDECTOMY     ATRIAL FIBRILLATION ABLATION  2007 ,2009   Dr [name] at [place]  04/24/2022   Procedure: BRONCHIAL BIOPSIES;  Surgeon: Maryjane Hurter, MD;  Location: [place];  Service: Pulmonary;;   BRONCHIAL BRUSHINGS  04/24/2022   Procedure: BRONCHIAL BRUSHINGS;  Surgeon: Maryjane Hurter, MD;  Location: [place];  Service: Pulmonary;;   BRONCHIAL NEEDLE ASPIRATION BIOPSY  04/24/2022   Procedure: BRONCHIAL NEEDLE ASPIRATION BIOPSIES;  Surgeon: Maryjane Hurter, MD;  Location: [facility];  Service: Pulmonary;;   BRONCHIAL WASHINGS  04/24/2022   Procedure: BRONCHIAL WASHINGS;  Surgeon:  Maryjane Hurter, MD;  Location: [facility] ENDOSCOPY;  Service: Pulmonary;;   EYE SURGERY Bilateral    cataracts   FRACTURE SURGERY     right clavicle   HEMOSTASIS CONTROL  04/24/2022   Procedure: HEMOSTASIS CONTROL;  Surgeon: Maryjane Hurter, MD;  Location: [facility] ENDOSCOPY;  Service: Pulmonary;;   PERIPHERAL VASCULAR INTERVENTION Bilateral 01/05/2022   Procedure: PERIPHERAL VASCULAR INTERVENTION;  Surgeon: Marty Heck, MD;  Location: [place] CV LAB;  Service: Cardiovascular;  Laterality: Bilateral;  Bilateral Renal Artery Stents   RENAL ANGIOGRAPHY Bilateral 01/05/2022   Procedure: RENAL ARTERY STENTING;  Surgeon: Marty Heck, MD;  Location: [place] CV LAB;  Service: Cardiovascular;  Laterality: Bilateral;   TONSILLECTOMY     VIDEO BRONCHOSCOPY WITH RADIAL ENDOBRONCHIAL ULTRASOUND  04/24/2022   Procedure: VIDEO BRONCHOSCOPY WITH RADIAL ENDOBRONCHIAL ULTRASOUND;  Surgeon: Maryjane Hurter, MD;  Location: [facility] ENDOSCOPY;  Service: Pulmonary;;    Family History  Problem Relation Age of Onset   Lung cancer Brother        smoked   Cancer Other    Colon cancer Neg Hx    Esophageal cancer Neg Hx    Rectal cancer Neg Hx    Stomach cancer Neg Hx    Social History:  reports that he has been smoking cigarettes. He has a 61.00 pack-year smoking history. He has never used smokeless tobacco. He reports that he does not currently use alcohol. He reports that he does not use drugs.  Allergies: No Known Allergies  Medications Prior to Admission  Medication Sig Dispense Refill   apixaban (ELIQUIS) 5 MG TABS tablet Take 5 mg by mouth 2 (two) times daily.     aspirin EC 81 MG tablet Take 81 mg by mouth daily. Swallow whole.     atorvastatin (LIPITOR) 40 MG tablet Take 40 mg by mouth every evening.      dofetilide (TIKOSYN) 500 MCG capsule Take 1 capsule (500 mcg total) by mouth 2 (two) times daily. 180 capsule 2   enalapril (VASOTEC) 10 MG tablet Take 1 tablet (10 mg total) by  mouth at bedtime. 90 tablet 3   ferrous sulfate 325 (65 FE) MG tablet Take 325 mg by mouth daily with breakfast.     finasteride (PROSCAR) 5 MG tablet Take 5 mg by mouth daily.     Magnesium 250 MG TABS Take 250 mg by mouth every evening.     metoprolol tartrate (LOPRESSOR) 50 MG tablet Take 1.5 tablets (75 mg total) by mouth 2 (two) times daily. 270 tablet 3   Multiple Vitamin (MULTIVITAMIN) tablet Take 1 tablet by mouth daily.     Multiple Vitamins-Minerals (ONE-A-DAY MENS HEALTH FORMULA PO) Take 1 tablet by mouth daily.     omeprazole (PRILOSEC) 20 MG capsule Take 20 mg by mouth daily.     potassium chloride SA (KLOR-CON M20) 20 MEQ tablet Take 1 tablet (20 mEq total) by mouth daily. 90 tablet 2   Tiotropium Bromide-Olodaterol (STIOLTO RESPIMAT) 2.5-2.5 MCG/ACT AERS Inhale 2 puffs into the lungs daily. 4 g 11   Tiotropium Bromide-Olodaterol (STIOLTO RESPIMAT) 2.5-2.5 MCG/ACT AERS Inhale 2 puffs into the lungs daily. 4 g 0   vitamin C (ASCORBIC ACID) 250 MG tablet Take 250 mg by mouth daily.      Results for orders placed or performed during the hospital encounter of 05/17/22 (from the past 48 hour(s))  Surgical pcr screen     Status: None   Collection Time: 05/17/22  1:33 PM   Specimen: Nasal Mucosa; Nasal Swab  Result Value Ref Range   MRSA, PCR NEGATIVE NEGATIVE   Staphylococcus aureus NEGATIVE NEGATIVE    Comment: (NOTE) The Xpert SA Assay (FDA approved for NASAL specimens in patients 46 years of age and older), is one component of a comprehensive surveillance program. It is not intended to diagnose infection nor to guide or monitor treatment. Performed at [facility], [place] [address], [place], [place] [address]   SARS CORONAVIRUS 2 (TAT 6-24 HRS) Anterior Nasal Swab     Status: None   Collection Time: 05/17/22  1:33 PM   Specimen: Anterior Nasal Swab  Result Value Ref Range   SARS Coronavirus 2 NEGATIVE NEGATIVE    Comment: (NOTE) SARS-CoV-2 target nucleic acids are  NOT DETECTED.  The SARS-CoV-2 RNA is generally detectable in upper and lower respiratory specimens during the acute phase of infection. Negative results do not preclude SARS-CoV-2 infection, do not rule out co-infections with other pathogens, and should not be used as the sole basis for treatment or other patient management decisions. Negative results must be combined with clinical observations, patient history, and epidemiological information. The expected result is Negative.  Fact Sheet for Patients: SugarRoll.be  Fact Sheet for Healthcare Providers: https://www.woods-mathews.com/  This test is not yet approved or cleared by the [place] FDA and  has been authorized for detection and/or diagnosis of SARS-CoV-2 by FDA under an Emergency Use Authorization (EUA). This EUA will remain  in effect (  meaning this test can be used) for the duration of the COVID-19 declaration under Se ction 564(b)(1) of the Act, 21 U.S.C. section 360bbb-3(b)(1), unless the authorization is terminated or revoked sooner.  Performed at [facility], [place] [address] [place], [place] [address]   Type and screen     Status: None   Collection Time: 05/17/22  1:43 PM  Result Value Ref Range   ABO/RH(D) O POS    Antibody Screen NEG    Sample Expiration 05/31/2022,2359    Extend sample reason      NO TRANSFUSIONS OR PREGNANCY IN THE PAST 3 MONTHS Performed at [facility], [place] [address], [place], [place] [address]   Blood gas, arterial on room air     Status: None   Collection Time: 05/17/22  1:45 PM  Result Value Ref Range   pH, Arterial 7.44 7.35 - 7.45   pCO2 arterial 36 32 - 48 mmHg   pO2, Arterial 95 83 - 108 mmHg   Bicarbonate 24.5 20.0 - 28.0 mmol/L   Acid-Base Excess 0.6 0.0 - 2.0 mmol/L   O2 Saturation 99.5 %   Patient temperature 37.0    Collection site LEFT BRACHIAL    Drawn by [id number]    Allens test (pass/fail) PASS PASS    Comment:  Performed at [facility] [address], [place], [place] [address]  CBC     Status: Abnormal   Collection Time: 05/17/22  1:58 PM  Result Value Ref Range   WBC 5.6 4.0 - 10.5 K/uL   RBC 3.83 (L) 4.22 - 5.81 MIL/uL   Hemoglobin 12.4 (L) 13.0 - 17.0 g/dL   HCT 35.6 (L) 39.0 - 52.0 %   MCV 93.0 80.0 - 100.0 fL   MCH 32.4 26.0 - 34.0 pg   MCHC 34.8 30.0 - 36.0 g/dL   RDW 13.6 11.5 - 15.5 %   Platelets 153 150 - 400 K/uL   nRBC 0.0 0.0 - 0.2 %    Comment: Performed at [facility], [place] [address] [place], [place] [address]  Comprehensive metabolic panel     Status: Abnormal   Collection Time: 05/17/22  1:58 PM  Result Value Ref Range   Sodium 139 135 - 145 mmol/L   Potassium 4.1 3.5 - 5.1 mmol/L   Chloride 106 98 - 111 mmol/L   CO2 21 (L) 22 - 32 mmol/L   Glucose, Bld 107 (H) 70 - 99 mg/dL    Comment: Glucose reference range applies only to samples taken after fasting for at least 8 hours.   BUN 18 8 - 23 mg/dL   Creatinine, Ser 1.41 (H) 0.61 - 1.24 mg/dL   Calcium 8.8 (L) 8.9 - 10.3 mg/dL   Total Protein 6.7 6.5 - 8.1 g/dL   Albumin 3.7 3.5 - 5.0 g/dL   AST 29 15 - 41 U/L   ALT 33 0 - 44 U/L   Alkaline Phosphatase 65 38 - 126 U/L   Total Bilirubin 0.5 0.3 - 1.2 mg/dL   GFR, Estimated 52 (L) >60 mL/min    Comment: (NOTE) Calculated using the CKD-EPI Creatinine Equation (2021)    Anion gap 12 5 - 15    Comment: Performed at [facility] [address], [place], [place] [address]  Protime-INR     Status: None   Collection Time: 05/17/22  1:58 PM  Result Value Ref Range   Prothrombin Time 14.2 11.4 - 15.2 seconds  INR 1.1 0.8 - 1.2    Comment: (NOTE) INR goal varies based on device and disease states. Performed at [facility], [place] [address], [place], [place] [address]   APTT     Status: None   Collection Time: 05/17/22  1:58 PM  Result Value Ref Range   aPTT 31 24 - 36 seconds    Comment: Performed at [facility] [address], [place], [place] [address]   DG Chest 2 View  Result Date: 05/18/2022 CLINICAL DATA:  Lung cancer and is having surgery on May 19, 2022 EXAM: CHEST - 2 VIEW COMPARISON:  None Available. FINDINGS: The heart size and mediastinal contours are within normal limits. Pulmonary nodules are stable in bilateral lungs. At the site of previously described post bronchoscopy change of the lateral left lung base, there is a small nodular density probably post bronchoscopy hematoma. Biapical pleural thickening noted. No focal pneumonia, pulmonary edema, or pleural effusion is noted. The osseous structures are stable. IMPRESSION: At the site of previously described post bronchoscopy change of the lateral left lung base, there is a small nodular density probably post bronchoscopy hematoma. There is no focal pneumonia, pulmonary edema or pleural effusion. Electronically Signed   By: Abelardo Diesel M.D.   On: 05/18/2022 15:37    Review of Systems  Constitutional:  Negative for fatigue and fever.  HENT:  Negative for congestion and rhinorrhea.   Respiratory:  Positive for cough. Negative for chest tightness and wheezing.   Cardiovascular:  Negative for chest pain and leg swelling.  Gastrointestinal:  Negative for abdominal pain and vomiting.    Blood pressure 121/61, pulse 70, temperature 98.6 F (37 C), temperature source Oral, resp. rate 18, height 6\' 1"  (1.854 m), weight 94.3 kg, SpO2 97 %.  General appearance: 76 y.o., male, NAD, conversant  Eyes: anicteric sclerae; PERRL, tracking appropriately HENT: NCAT; MMM Neck: Trachea midline; no lymphadenopathy, no JVD Lungs: CTAB, no crackles, no wheeze, with normal respiratory effort CV: RRR, no murmur  Abdomen: Soft, non-tender; non-distended, BS present  Extremities: No peripheral edema, warm Skin: Normal turgor and texture; no rash Psych: Appropriate affect Neuro: Alert and oriented to person and place, no focal deficit    Assessment/Plan # Lingula  nodule  - plan for robotic bronchoscopy, dye-marking, TBNA, brushing followed by RATS wedge resection  Maryjane Hurter, MD 05/19/2022, 7:13 AM

## 2022-05-19 NOTE — Anesthesia Postprocedure Evaluation (Deleted)
Anesthesia Post Note  Patient: Samuel Ferrell  Procedure(s) Performed: ROBOTIC ASSISTED NAVIGATIONAL BRONCHOSCOPY dye marking VIDEO BRONCHOSCOPY WITH RADIAL ENDOBRONCHIAL ULTRASOUND BRONCHIAL NEEDLE ASPIRATION BIOPSIES BRONCHIAL BRUSHINGS     Anesthesia Post Evaluation No notable events documented.  Last Vitals:  Vitals:   05/19/22 0552 05/19/22 1225  BP: 121/61 (!) 163/79  Pulse: 70 77  Resp: 18 19  Temp: 37 C 36.6 C  SpO2: 97% 94%    Last Pain:  Vitals:   05/19/22 0617  TempSrc:   PainSc: 0-No pain                 Nolon Nations

## 2022-05-19 NOTE — Hospital Course (Addendum)
HPI: This is a 76 year old gentleman with a past history significant for tobacco abuse, COPD, hypertension, hyperlipidemia, atrial fibrillation with 2 prior ablations, atypical atrial flutter, TIA, anemia, reflux, melanoma, and ethanol abuse (quit 5 years ago).  He had a low-dose screening CT for lung cancer back in January which showed a left upper lobe nodule.  A follow-up scan about 4 months later showed the nodule had increased in size by 2 mm.  He then had a PET/CT which shows the nodule is hypermetabolic.  He was referred to Dr. Verlee Monte.  Navigational bronchoscopy was nondiagnostic.   He smoked about a pack of cigarettes daily for 60 years.  He says he is now a little bit less than that but has not tried to quit altogether.  He complains of a cough since he started his inhaler but did not have one prior to that.  He says he will feel an episode of atrial fibrillation about once a month but only every 2 or 3 months with a significant.  He denies any chest pain, pressure, or tightness.  He does complain of pain in the calves when he walks.  More limited by that.  Shortness of breath only with heavy exertion.  Dr. Roxan Hockey discussed potential treatment options including surgical resection versus stereotactic radiation.  Even though he does not have a diagnosis he would likely be a candidate for empiric radiation given the high degree of probability that this is cancerous.  Dr. Roxan Hockey did discuss that surgery is the Samuel Ferrell standard and he has adequate lung function to tolerate resection. Potential risks, benefits, and complications of the surgery were discussed and the patient agreed to proceed with surgery. Of note, he has a heart murmur on exam and has some calcification around his aortic valve.  He also has extensive coronary calcification on CT.  In addition he has his atrial fibrillation issues. Cardiology clearance was obtained in order for him to undergo thoracic surgery.  Hospital  Course: Patient underwent an Xi robotic assisted left thoracoscopy, LUL, lymph node dissection, and intercostal nerve block. He was transferred from the OR to PACU in stable condition. Chest tube was to water seal and there was a large air leak. Daily chest x rays were taken and remained stable. Air leak resolved by postoperative day #2, output decreased,  and chest tube was removed on 09/11. Follow up chest x ray showed no pneumothorax, stable subcutaneous emphysema left lateral chest wall and left basilar atelectasis. Of note, patient had persistent hoarseness post op. Patient has been tolerating a diet. Patient is ambulating on room air with good oxygenation. All wounds are clean, dry, and healing without signs of infection. PA/LAT CXR this am shows similar subcutaneous emphysema without visible pneumothorax. As discussed with Dr. Roxan Hockey, the patient is felt surgically stable for discharge today.

## 2022-05-19 NOTE — Progress Notes (Signed)
A line discontinued at this time patient tolerated procedure well dry pressure dressing in place

## 2022-05-19 NOTE — Discharge Instructions (Signed)
Robot-Assisted Thoracic Surgery, Care After The following information offers guidance on how to care for yourself after your procedure. Your health care provider may also give you more specific instructions. If you have problems or questions, contact your health care provider. What can I expect after the procedure? After the procedure, it is common to have: Some pain and aches in the area of your surgical incisions. Pain when breathing in (inhaling) and coughing. Tiredness (fatigue). Trouble sleeping. Constipation. Follow these instructions at home: Medicines Take over-the-counter and prescription medicines only as told by your health care provider. If you were prescribed an antibiotic medicine, take it as told by your health care provider. Do not stop taking the antibiotic even if you start to feel better. Talk with your health care provider about safe and effective ways to manage pain after your procedure. Pain management should fit your specific health needs. Take pain medicine before pain becomes severe. Relieving and controlling your pain will make breathing easier for you. Ask your health care provider if the medicine prescribed to you requires you to avoid driving or using machinery. Eating and drinking Follow instructions from your health care provider about eating or drinking restrictions. These will vary depending on what procedure you had. Your health care provider may recommend: A liquid diet or soft diet for the first few days. Meals that are smaller and more frequent. A diet of fruits, vegetables, whole grains, and low-fat proteins. Limiting foods that are high in fat and processed sugar, including fried or sweet foods. Incision care Follow instructions from your health care provider about how to take care of your incisions. Make sure you: Wash your hands with soap and water for at least 20 seconds before and after you change your bandage (dressing). If soap and water are not  available, use hand sanitizer. Change your dressing as told by your health care provider. Leave stitches (sutures), skin glue, or adhesive strips in place. These skin closures may need to stay in place for 2 weeks or longer. If adhesive strip edges start to loosen and curl up, you may trim the loose edges. Do not remove adhesive strips completely unless your health care provider tells you to do that. Check your incision area every day for signs of infection. Check for: Redness, swelling, or more pain. Fluid or blood. Warmth. Pus or a bad smell. Activity Return to your normal activities as told by your health care provider. Ask your health care provider what activities are safe for you. Ask your health care provider when it is safe for you to drive. Do not lift anything that is heavier than 10 lb (4.5 kg), or the limit that you are told, until your health care provider says that it is safe. Rest as told by your health care provider. Avoid sitting for a long time without moving. Get up to take short walks every 1-2 hours. This is important to improve blood flow and breathing. Ask for help if you feel weak or unsteady. Do exercises as told by your health care provider. Pneumonia prevention  Do deep breathing exercises and cough regularly as directed. This helps clear mucus and opens your lungs. Doing this helps prevent lung infection (pneumonia). If you were given an incentive spirometer, use it as told. An incentive spirometer is a tool that measures how well you are filling your lungs with each breath. Coughing may hurt less if you try to support your chest. This is called splinting. Try one of these when you  cough: Hold a pillow against your chest. Place the palms of both hands on top of your incision area. Do not use any products that contain nicotine or tobacco. These products include cigarettes, chewing tobacco, and vaping devices, such as e-cigarettes. If you need help quitting, ask your  health care provider. Avoid secondhand smoke. General instructions If you have a drainage tube: Follow instructions from your health care provider about how to take care of it. Do not travel by airplane after your tube is removed until your health care provider tells you it is safe. You may need to take these actions to prevent or treat constipation: Drink enough fluid to keep your urine pale yellow. Take over-the-counter or prescription medicines. Eat foods that are high in fiber, such as beans, whole grains, and fresh fruits and vegetables. Limit foods that are high in fat and processed sugars, such as fried or sweet foods. Keep all follow-up visits. This is important. Contact a health care provider if: You have redness, swelling, or more pain around an incision. You have fluid or blood coming from an incision. An incision feels warm to the touch. You have pus or a bad smell coming from an incision. You have a fever. You cannot eat or drink without vomiting. Your pain medicine is not controlling your pain. Get help right away if: You have chest pain. Your heart is beating quickly. You have trouble breathing. You have trouble speaking. You are confused. You feel weak or dizzy, or you faint. These symptoms may represent a serious problem that is an emergency. Do not wait to see if the symptoms will go away. Get medical help right away. Call your local emergency services (911 in the [place] Do not drive yourself to the hospital. Summary Talk with your health care provider about safe and effective ways to manage pain after your procedure. Pain management should fit your specific health needs. Return to your normal activities as told by your health care provider. Ask your health care provider what activities are safe for you. Do deep breathing exercises and cough regularly as directed. This helps to clear mucus and prevent pneumonia. If it hurts to cough, ease pain by holding a pillow  against your chest or by placing the palms of both hands over your incisions. This information is not intended to replace advice given to you by your health care provider. Make sure you discuss any questions you have with your health care provider. Document Revised: 05/21/2020 Document Reviewed: 05/21/2020 Elsevier Patient Education  [place].

## 2022-05-19 NOTE — Interval H&P Note (Signed)
History and Physical Interval Note:  05/19/2022 7:15 AM  Samuel Ferrell  has presented today for surgery, with the diagnosis of LUL NODULE.  The various methods of treatment have been discussed with the patient and family. After consideration of risks, benefits and other options for treatment, the patient has consented to  Procedure(s): XI ROBOTIC ASSISTED THORACOSCOPY-LEFT UPPER LOBE WEDGE RESECTION, possible upper lobectomy (Left) as a surgical intervention.  The patient's history has been reviewed, patient examined, no change in status, stable for surgery.  I have reviewed the patient's chart and labs.  Questions were answered to the patient's satisfaction.     Melrose Nakayama

## 2022-05-19 NOTE — Op Note (Signed)
Video Bronchoscopy with Robotic Assisted Bronchoscopic Navigation   Date of Operation: 05/19/2022   Pre-op Diagnosis: Lingula pulmonary nodule  Post-op Diagnosis: Lingula pulmonary nodule  Surgeon: Walker Shadow  Assistants: Londell Moh  Anesthesia: General endotracheal anesthesia  Operation: Flexible video fiberoptic bronchoscopy with robotic assistance and biopsies.  Estimated Blood Loss: Minimal  Complications: None  Indications and History: Samuel Ferrell is a 76 y.o. male with history of lingula pulmonary nodule, smoking. The risks, benefits, complications, treatment options and expected outcomes were discussed with the patient.  The possibilities of pneumothorax, pneumonia, reaction to medication, pulmonary aspiration, perforation of a viscus, bleeding, failure to diagnose a condition and creating a complication requiring transfusion or operation were discussed with the patient who freely signed the consent.    Description of Procedure: The patient was seen in the Preoperative Area, was examined and was deemed appropriate to proceed.  The patient was taken to [facility] endoscopy room 3, identified as Samuel Ferrell and the procedure verified as Flexible Video Fiberoptic Bronchoscopy.  A Time Out was held and the above information confirmed.   Prior to the date of the procedure a high-resolution CT scan of the chest was performed. Utilizing ION software program a virtual tracheobronchial tree was generated to allow the creation of distinct navigation pathways to the patient's parenchymal abnormalities. After being taken to the operating room general anesthesia was initiated and the patient  was orally intubated. The video fiberoptic bronchoscope was introduced via the endotracheal tube and a general inspection was performed which showed normal right and left lung anatomy, aspiration of the bilateral mainstems was completed to remove any remaining secretions. Robotic catheter inserted into patient's  endotracheal tube.   Target #1 Lingula pulmonary nodule: The distinct navigation pathways prepared prior to this procedure were then utilized to navigate to patient's lesion identified on CT scan. CIOS imaging was used to aid navigation and confirm ideal location for biopsy. The robotic catheter was secured into place and the vision probe was withdrawn.  Lesion location was approximated using fluoroscopy and radial endobronchial ultrasound for peripheral targeting. Under fluoroscopic guidance transbronchial needle brushing, transbronchial needle biopsy, were performed to be sent for cytology and pathology. 0.75cc methylene blue and indocyanine green dye injected at site of lesion under fluoro.  Samples Target #1: 1. Transbronchial needle brushing from lingula pulmonary nodule 2. Transbronchial Wang needle biopsy from lingula pulmonary nodule  Plans:  The patient will be transferred to the OR under the care of anesthesia for RATS wedge resection of lingula pulmonary nodule.

## 2022-05-19 NOTE — Discharge Summary (Addendum)
Physician Discharge Summary       [address]       [place] [address]             [phone]    Patient ID: Samuel Ferrell MRN: [id number] DOB/AGE: [birth year] 76 y.o.  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 S/p Xi robotic assisted left thoracoscopy, LUL, LN dissection, and intercostal nerve block 3. History of the following: [facility])       s/p ablation x2 by Dr [name] at [facility] in 2007, 2009   Alcohol abuse     Anemia     Atypical atrial flutter (HCC)     COPD (chronic obstructive pulmonary disease) ([place])     COVID      mild case   CVA (cerebral infarction)     GERD (gastroesophageal reflux disease)     Hyperlipidemia     Hypertension     Palpitations     Pneumonia      1970's   Skin cancer--melanoma     Stroke ([place])      TIA     Consults: None  Procedure (s):  Xi robotic-assisted left upper lobectomy, lymph node dissection and intercostal nerve blocks, levels 3 through 10 by Dr. Roxan Hockey on 05/19/2022.   Pathology: Final pathology result pending  HPI: This is a 76 year old gentleman with a past history significant for tobacco abuse, COPD, hypertension, hyperlipidemia, atrial fibrillation with 2 prior ablations, atypical atrial flutter, TIA, anemia, reflux, melanoma, and ethanol abuse (quit 5 years ago).  He had a low-dose screening CT for lung cancer back in January which showed a left upper lobe nodule.  A follow-up scan about 4 months later showed the nodule had increased in size by 2 mm.  He then had a PET/CT which shows the nodule is hypermetabolic.  He was referred to Dr. Verlee Monte.  Navigational bronchoscopy was nondiagnostic.   He smoked about a pack of cigarettes daily for 60 years.  He says he is now a little bit less than that but has not tried to quit altogether.  He complains of a cough  since he started his inhaler but did not have one prior to that.  He says he will feel an episode of atrial fibrillation about once a month but only every 2 or 3 months with a significant.  He denies any chest pain, pressure, or tightness.  He does complain of pain in the calves when he walks.  More limited by that.  Shortness of breath only with heavy exertion.  Dr. Roxan Hockey discussed potential treatment options including surgical resection versus stereotactic radiation.  Even though he does not have a diagnosis he would likely be a candidate for empiric radiation given the high degree of probability that this is cancerous.  Dr. Roxan Hockey did discuss that surgery is the gold standard and he has adequate lung function to tolerate resection. Potential risks, benefits, and complications of the surgery were discussed and the patient agreed to proceed with surgery. Of note, he has a heart murmur on exam and has some calcification around his aortic valve.  He also has extensive coronary calcification on CT.  In addition he has his atrial fibrillation issues. Cardiology clearance was obtained in order for him to undergo thoracic surgery.  Hospital Course: Patient underwent an Xi robotic assisted left thoracoscopy, LUL, lymph node dissection, and intercostal nerve  block. He was transferred from the OR to PACU in stable condition. Chest tube was to water seal and there was a large air leak. Daily chest x rays were taken and remained stable. Air leak resolved by postoperative day #2, output decreased,  and chest tube was removed on 09/11. Follow up chest x ray showed no pneumothorax, stable subcutaneous emphysema left lateral chest wall and left basilar atelectasis. Of note, patient had persistent hoarseness post op. Patient has been tolerating a diet. Patient is ambulating on room air with good oxygenation. All wounds are clean, dry, and healing without signs of infection. PA/LAT CXR this am shows similar  subcutaneous emphysema without visible pneumothorax. As discussed with Dr. Roxan Hockey, the patient is felt surgically stable for discharge today.    Latest Vital Signs: Blood pressure (!) 120/56, pulse 89, temperature 97.6 F (36.4 C), temperature source Oral, resp. rate 16, height 6\' 1"  (1.854 m), weight 94.3 kg, SpO2 93 %.  Physical Exam: Cardiovascular: RRR Pulmonary: Clear to auscultation on the right, coarse like on left (chest tube rub) Abdomen: Soft, non tender, bowel sounds present. Extremities: No lower extremity edema. Wounds: Clean and dry.  No erythema or signs of infection. Trace sero sanguinous drainage from chest tube wound  Discharge Condition:Stable and discharged to home.  Recent laboratory studies:  Lab Results  Component Value Date   WBC 7.8 05/21/2022   HGB 12.6 (L) 05/21/2022   HCT 37.2 (L) 05/21/2022   MCV 95.1 05/21/2022   PLT 143 (L) 05/21/2022   Lab Results  Component Value Date   NA 136 05/23/2022   K 4.0 05/23/2022   CL 105 05/23/2022   CO2 24 05/23/2022   CREATININE 1.24 05/23/2022   GLUCOSE 123 (H) 05/23/2022      Diagnostic Studies: DG Chest 2 View  Result Date: 05/23/2022 CLINICAL DATA:  Left upper lobe nodule.  Pneumothorax. EXAM: CHEST - 2 VIEW COMPARISON:  Chest x-ray 05/22/2022. FINDINGS: Similar subcutaneous emphysema without visible pneumothorax. Left lung volume loss with elevated left hemidiaphragm similar left basilar atelectasis small left pleural effusion versus pleural thickening. Cardiomediastinal silhouette is unchanged. Right lung is clear. IMPRESSION: Similar subcutaneous emphysema without visible pneumothorax. Electronically Signed   By: Margaretha Sheffield M.D.   On: 05/23/2022 08:14   DG CHEST PORT 1 VIEW  Result Date: 05/22/2022 CLINICAL DATA:  Status post chest tube removal. EXAM: PORTABLE CHEST 1 VIEW COMPARISON:  Same day. FINDINGS: No definite pneumothorax is noted status post left-sided chest tube removal. Stable  subcutaneous emphysema is seen over left lateral chest wall. Stable left basilar subsegmental atelectasis. IMPRESSION: No definite pneumothorax status post left-sided chest tube removal. Electronically Signed   By: Marijo Conception M.D.   On: 05/22/2022 13:12   DG Chest Port 1 View  Result Date: 05/22/2022 CLINICAL DATA:  [id number].  Follow-up chest tube. EXAM: PORTABLE CHEST 1 VIEW COMPARISON:  05/21/2022. FINDINGS: Left chest tube remains in place. No visible pleural air. Chest wall air slightly diminished. Right lung remains well aerated. Chronic pleural scarring with pleural calcifications. IMPRESSION: Left chest tube remains in place.  No visible pleural air. Electronically Signed   By: Nelson Chimes M.D.   On: 05/22/2022 07:47   DG Chest Port 1 View  Result Date: 05/21/2022 CLINICAL DATA:  Status post left-sided lobectomy EXAM: PORTABLE CHEST 1 VIEW COMPARISON:  Prior chest x-ray yesterday, 05/20/2022 FINDINGS: Left-sided chest tube remains in place. Trace left pneumothorax in extensive subcutaneous emphysema present. Increasing lobular density partially obscuring the  left cardiac margin likely represents atelectasis/collapse of the lingula. This may be secondary to an increasing anterior component of the pneumothorax. Calcified pleural plaques in granuloma at the right lung base are unchanged. Stable cardiomegaly. Atherosclerotic calcifications noted in the transverse aorta. IMPRESSION: 1. Increased lobular density partially obscuring the left cardiac margin likely represents lingular atelectasis/collapse and may be secondary to mucous plugging or increasing anterior pneumothorax. 2. Stable position of chest tube with small residual lateral pneumothorax. 3. Similar appearance of extensive subcutaneous emphysema. Electronically Signed   By: Jacqulynn Cadet M.D.   On: 05/21/2022 06:57   DG Chest Port 1 View  Result Date: 05/20/2022 CLINICAL DATA:  Pneumothorax. EXAM: PORTABLE CHEST 1 VIEW COMPARISON:   05/19/2022 FINDINGS: Left apical chest tube unchanged. Persistent small left-sided pneumothorax without significant change. Lungs are otherwise adequately inflated without focal consolidation or effusion. Cardiomediastinal silhouette is within normal. Subcutaneous emphysema over the left neck base and left flank without significant change. Remainder of the exam is unchanged. IMPRESSION: 1. Stable small left-sided pneumothorax. Left apical chest tube unchanged. 2. No acute findings. Electronically Signed   By: Marin Olp M.D.   On: 05/20/2022 08:01   DG Chest Port 1 View  Result Date: 05/19/2022 CLINICAL DATA:  Status post thorascopy. EXAM: PORTABLE CHEST 1 VIEW COMPARISON:  Radiograph 05/17/2022 and 04/24/2022.  CT 04/12/2022. FINDINGS: 1228 hours. Status post left thoracotomy and presumed upper lobectomy. A left chest tube is in place, terminating lateral to the aortic arch. There is a small left-sided pneumothorax with fairly extensive soft tissue emphysema in the left lateral chest wall. Partially calcified right apical scarring and calcified pleural plaques along the right hemidiaphragm appear unchanged. The right lung is clear. The heart size and mediastinal contours are stable. IMPRESSION: Small left-sided pneumothorax with left chest wall soft tissue emphysema status post thoracotomy and presumed left upper lobectomy. Left chest tube appears adequately positioned. Electronically Signed   By: Richardean Sale M.D.   On: 05/19/2022 13:59   DG C-ARM BRONCHOSCOPY  Result Date: 05/19/2022 C-ARM BRONCHOSCOPY: Fluoroscopy was utilized by the requesting physician.  No radiographic interpretation.   DG Chest 2 View  Result Date: 05/18/2022 CLINICAL DATA:  Lung cancer and is having surgery on May 19, 2022 EXAM: CHEST - 2 VIEW COMPARISON:  None Available. FINDINGS: The heart size and mediastinal contours are within normal limits. Pulmonary nodules are stable in bilateral lungs. At the site of previously  described post bronchoscopy change of the lateral left lung base, there is a small nodular density probably post bronchoscopy hematoma. Biapical pleural thickening noted. No focal pneumonia, pulmonary edema, or pleural effusion is noted. The osseous structures are stable. IMPRESSION: At the site of previously described post bronchoscopy change of the lateral left lung base, there is a small nodular density probably post bronchoscopy hematoma. There is no focal pneumonia, pulmonary edema or pleural effusion. Electronically Signed   By: Abelardo Diesel M.D.   On: 05/18/2022 15:37   DG CHEST PORT 1 VIEW  Result Date: 04/24/2022 CLINICAL DATA:  Status post bronchoscopy EXAM: PORTABLE CHEST 1 VIEW COMPARISON:  Previous studies including the CT chest done on 04/12/2022 FINDINGS: Cardiac size is within normal limits. There is interval appearance of alveolar densities in the left parahilar region, possibly pulmonary contusion related to bronchoscopic biopsy. There are no signs of alveolar pulmonary edema. There is no pleural effusion or pneumothorax. Scattered pleural calcifications are seen. IMPRESSION: There is interval appearance of small focus of alveolar densities in the lateral  aspect of left mid lung field, most likely small area of pulmonary hemorrhage related to recent bronchoscopy biopsy. There is no pleural effusion or pneumothorax. Electronically Signed   By: Elmer Picker M.D.   On: 04/24/2022 10:18   DG C-ARM BRONCHOSCOPY  Result Date: 04/24/2022 C-ARM BRONCHOSCOPY: Fluoroscopy was utilized by the requesting physician.  No radiographic interpretation.   DG C-Arm 1-60 Min-No Report  Result Date: 04/24/2022 Fluoroscopy was utilized by the requesting physician.  No radiographic interpretation.         Discharge Medications: Allergies as of 05/23/2022   No Known Allergies      Medication List     TAKE these medications    apixaban 5 MG Tabs tablet Commonly known as: ELIQUIS Take  5 mg by mouth 2 (two) times daily.   aspirin EC 81 MG tablet Take 81 mg by mouth daily. Swallow whole.   atorvastatin 40 MG tablet Commonly known as: LIPITOR Take 40 mg by mouth every evening.   dofetilide 500 MCG capsule Commonly known as: TIKOSYN Take 1 capsule (500 mcg total) by mouth 2 (two) times daily.   enalapril 10 MG tablet Commonly known as: VASOTEC Take 1 tablet (10 mg total) by mouth at bedtime.   ferrous sulfate 325 (65 FE) MG tablet Take 325 mg by mouth daily with breakfast.   finasteride 5 MG tablet Commonly known as: PROSCAR Take 5 mg by mouth daily.   guaiFENesin 600 MG 12 hr tablet Commonly known as: MUCINEX Take 2 tablets (1,200 mg total) by mouth 2 (two) times daily as needed for to loosen phlegm or cough.   Magnesium 250 MG Tabs Take 250 mg by mouth every evening.   metoprolol tartrate 50 MG tablet Commonly known as: LOPRESSOR Take 1.5 tablets (75 mg total) by mouth 2 (two) times daily.   multivitamin tablet Take 1 tablet by mouth daily.   omeprazole 20 MG capsule Commonly known as: PRILOSEC Take 20 mg by mouth daily.   ONE-A-DAY MENS HEALTH FORMULA PO Take 1 tablet by mouth daily.   oxyCODONE 5 MG immediate release tablet Commonly known as: Oxy IR/ROXICODONE Take 1 tablet (5 mg total) by mouth every 6 (six) hours as needed for moderate pain.   potassium chloride SA 20 MEQ tablet Commonly known as: Klor-Con M20 Take 1 tablet (20 mEq total) by mouth daily.   Stiolto Respimat 2.5-2.5 MCG/ACT Aers Generic drug: Tiotropium Bromide-Olodaterol Inhale 2 puffs into the lungs daily. What changed: Another medication with the same name was removed. Continue taking this medication, and follow the directions you see here.   vitamin C 250 MG tablet Commonly known as: ASCORBIC ACID Take 250 mg by mouth daily.        Follow Up Appointments:  Follow-up Information     Melrose Nakayama, MD. Go on 06/06/2022.   Specialty: Cardiothoracic  Surgery Why: PA/LAT CXR to be taken (at [facility] which is in the same building as Dr. Leonarda Salon office) on 09/26 at 8:45 am;Appointment time is at 9:15 am Contact information: [address] [place] [address] [phone]                 Signed: Sharalyn Ink [facility] 05/23/2022, 8:28 AM

## 2022-05-19 NOTE — Brief Op Note (Signed)
05/19/2022  12:15 PM  PATIENT:  Samuel Ferrell  76 y.o. male  PRE-OPERATIVE DIAGNOSIS:  LEFT UPPER LOBE NODULE  POST-OPERATIVE DIAGNOSIS: Adenocarcinoma LUL  PROCEDURE:  XI ROBOTIC ASSISTED LEFT THORACOSCOPY, LEFT UPPER LOBECTOMY, LYMPH NODE DISSECTION, and INTERCOSTAL NERVE BLOCK,   SURGEON:  Surgeon(s) and Role:    Melrose Nakayama, MD - Primary  PHYSICIAN ASSISTANT: Lars Pinks PA-C  ANESTHESIA:   general  EBL:  100 mL   BLOOD ADMINISTERED:none  DRAINS:  66 Blake placed in the left pleural space    LOCAL MEDICATIONS USED:  OTHER Exparel  SPECIMEN:  Source of Specimen:  LUL, multiple lymph nodes  DISPOSITION OF SPECIMEN:   Pathology. Frozen LUL was positive for adenocarcinoma ;margin negative for cancer  COUNTS CORRECT:  YES  DICTATION: .Dragon Dictation  PLAN OF CARE: Admit to inpatient   PATIENT DISPOSITION:  PACU - hemodynamically stable.   Delay start of Pharmacological VTE agent (>24hrs) due to surgical blood loss or risk of bleeding: no

## 2022-05-20 ENCOUNTER — Inpatient Hospital Stay (HOSPITAL_COMMUNITY): Payer: No Typology Code available for payment source

## 2022-05-20 LAB — BASIC METABOLIC PANEL
Anion gap: 9 (ref 5–15)
BUN: 23 mg/dL (ref 8–23)
CO2: 20 mmol/L — ABNORMAL LOW (ref 22–32)
Calcium: 8 mg/dL — ABNORMAL LOW (ref 8.9–10.3)
Chloride: 108 mmol/L (ref 98–111)
Creatinine, Ser: 1.47 mg/dL — ABNORMAL HIGH (ref 0.61–1.24)
GFR, Estimated: 50 mL/min — ABNORMAL LOW (ref >60)
Glucose, Bld: 113 mg/dL — ABNORMAL HIGH (ref 70–99)
Potassium: 4 mmol/L (ref 3.5–5.1)
Sodium: 137 mmol/L (ref 135–145)

## 2022-05-20 LAB — CBC
HCT: 35.2 % — ABNORMAL LOW (ref 39.0–52.0)
Hemoglobin: 12.3 g/dL — ABNORMAL LOW (ref 13.0–17.0)
MCH: 32.5 pg (ref 26.0–34.0)
MCHC: 34.9 g/dL (ref 30.0–36.0)
MCV: 93.1 fL (ref 80.0–100.0)
Platelets: 143 10*3/uL — ABNORMAL LOW (ref 150–400)
RBC: 3.78 MIL/uL — ABNORMAL LOW (ref 4.22–5.81)
RDW: 13.8 % (ref 11.5–15.5)
WBC: 8.2 10*3/uL (ref 4.0–10.5)
nRBC: 0 % (ref 0.0–0.2)

## 2022-05-20 NOTE — Progress Notes (Addendum)
[address]       [place] [address]             [phone]      1 Day Post-Op Procedure(s) (LRB): XI ROBOTIC ASSISTED THORACOSCOPY-LEFT UPPER LOBECTOMY (Left) INTERCOSTAL NERVE BLOCK (Left) LYMPH NODE DISSECTION (Left) Subjective: Some discomfort, hoarse  Objective: Vital signs in last 24 hours: Temp:  [97.6 F (36.4 C)-98 F (36.7 C)] 97.7 F (36.5 C) (09/09 0700) Pulse Rate:  [69-87] 77 (09/09 0700) Cardiac Rhythm: Normal sinus rhythm (09/09 0701) Resp:  [14-26] 26 (09/09 0700) BP: (111-163)/(51-81) 128/54 (09/09 0700) SpO2:  [93 %-98 %] 97 % (09/09 0700) Arterial Line BP: (169-183)/(59-62) 171/59 (09/08 1300)  Hemodynamic parameters for last 24 hours:    Intake/Output from previous day: 09/08 0701 - 09/09 0700 In: 3543.1 [P.O.:120; I.V.:2743.4; IV Piggyback:679.7] Out: 1865 [ZYSAY:3016; Blood:100; Chest Tube:390] Intake/Output this shift: No intake/output data recorded.  General appearance: alert, cooperative, and no distress Heart: regular rate and rhythm Lungs: coarse left lower fields, + sq air Abdomen: + distension, + BS, passing flatus, minor LUQ tendermess, no guarding or rebound Extremities: no edema Wound: incis healing well  Lab Results: Recent Labs    05/17/22 1358 05/20/22 0712  WBC 5.6 8.2  HGB 12.4* 12.3*  HCT 35.6* 35.2*  PLT 153 143*   BMET:  Recent Labs    05/17/22 1358 05/20/22 0712  NA 139 137  K 4.1 4.0  CL 106 108  CO2 21* 20*  GLUCOSE 107* 113*  BUN 18 23  CREATININE 1.41* 1.47*  CALCIUM 8.8* 8.0*    PT/INR:  Recent Labs    05/17/22 1358  LABPROT 14.2  INR 1.1   ABG    Component Value Date/Time   PHART 7.44 05/17/2022 1345   HCO3 24.5 05/17/2022 1345   TCO2 26 01/05/2022 0708   O2SAT 99.5 05/17/2022 1345   CBG (last 3)  No results for input(s): "GLUCAP" in the last 72 hours.  Meds Scheduled Meds:  acetaminophen  1,000 mg Oral Q6H   Or   acetaminophen (TYLENOL) oral liquid 160  mg/5 mL  1,000 mg Oral Q6H   arformoterol  15 mcg Nebulization BID   And   umeclidinium bromide  1 puff Inhalation Daily   aspirin EC  81 mg Oral Daily   atorvastatin  40 mg Oral QPM   bisacodyl  10 mg Oral Daily   dofetilide  500 mcg Oral BID   enoxaparin (LOVENOX) injection  40 mg Subcutaneous Q24H   ferrous sulfate  325 mg Oral Q breakfast   finasteride  5 mg Oral Daily   metoprolol tartrate  75 mg Oral BID   pantoprazole  40 mg Oral Daily   senna-docusate  1 tablet Oral QHS   Continuous Infusions:  sodium chloride 75 mL/hr at 05/20/22 0750   PRN Meds:.fentaNYL (SUBLIMAZE) injection, ketorolac, ondansetron (ZOFRAN) IV, oxyCODONE, traMADol  Xrays DG Chest Port 1 View  Result Date: 05/20/2022 CLINICAL DATA:  Pneumothorax. EXAM: PORTABLE CHEST 1 VIEW COMPARISON:  05/19/2022 FINDINGS: Left apical chest tube unchanged. Persistent small left-sided pneumothorax without significant change. Lungs are otherwise adequately inflated without focal consolidation or effusion. Cardiomediastinal silhouette is within normal. Subcutaneous emphysema over the left neck base and left flank without significant change. Remainder of the exam is unchanged. IMPRESSION: 1. Stable small left-sided pneumothorax. Left apical chest tube unchanged. 2. No acute findings. Electronically Signed   By: Marin Olp M.D.   On: 05/20/2022  08:01   DG Chest Port 1 View  Result Date: 05/19/2022 CLINICAL DATA:  Status post thorascopy. EXAM: PORTABLE CHEST 1 VIEW COMPARISON:  Radiograph 05/17/2022 and 04/24/2022.  CT 04/12/2022. FINDINGS: 1228 hours. Status post left thoracotomy and presumed upper lobectomy. A left chest tube is in place, terminating lateral to the aortic arch. There is a small left-sided pneumothorax with fairly extensive soft tissue emphysema in the left lateral chest wall. Partially calcified right apical scarring and calcified pleural plaques along the right hemidiaphragm appear unchanged. The right lung is  clear. The heart size and mediastinal contours are stable. IMPRESSION: Small left-sided pneumothorax with left chest wall soft tissue emphysema status post thoracotomy and presumed left upper lobectomy. Left chest tube appears adequately positioned. Electronically Signed   By: Richardean Sale M.D.   On: 05/19/2022 13:59   DG C-ARM BRONCHOSCOPY  Result Date: 05/19/2022 C-ARM BRONCHOSCOPY: Fluoroscopy was utilized by the requesting physician.  No radiographic interpretation.    Assessment/Plan: S/P Procedure(s) (LRB): XI ROBOTIC ASSISTED THORACOSCOPY-LEFT UPPER LOBECTOMY (Left) INTERCOSTAL NERVE BLOCK (Left) LYMPH NODE DISSECTION (Left) POD#1  1 afeb, s BP 110's-160's,mostly well controlled, SR with PAC's and PVC's- on Tykosin, previous ablation for afib, hold on restart vasotec for now with creat slightly rising , may need other agent, monitor for now 2 O2 sats ok on 2 liters 3 CT 390 cc recorded- keep for now, + air leak, on H2O seal for now 4 adeq UOP 5 CXR stable small left pntx, sub q air is stable 6 creat 1.47, has mild CKD , cont IVF for now 7 routine pulm hygiene- cont current resp meds 9 mobilize/rehab- routine   LOS: 1 day    Samuel Ferrell 05/20/2022   Patient seen and examined, agree with above Hoarse- may be due to ETT but may be a component of recurrent nerve dysfunction as he did have enlarged AP window nodes Overall looks good Will dc Toradol given CKD  Remo Lipps C. Roxan Hockey, MD [facility] [phone]

## 2022-05-20 NOTE — Progress Notes (Addendum)
Mobility Specialist Progress Note    05/20/22 1559  Mobility  Activity Ambulated independently in hallway  Level of Assistance Standby assist, set-up cues, supervision of patient - no hands on  Assistive Device None  Distance Ambulated (ft) 420 ft  Activity Response Tolerated well  $Mobility charge 1 Mobility   During Mobility: 85 HR, >/=90% SpO2 Post-Mobility: 73 HR, 98% SpO2  Pt received in chair and agreeable. No complaints on walk. Maintained SpO2 >/=90% on RA. Returned to chair with call bell in reach. RN advised leave on RA.   Hildred Alamin Mobility Specialist

## 2022-05-21 ENCOUNTER — Inpatient Hospital Stay (HOSPITAL_COMMUNITY): Payer: No Typology Code available for payment source

## 2022-05-21 ENCOUNTER — Encounter (HOSPITAL_COMMUNITY): Payer: Self-pay | Admitting: Student

## 2022-05-21 LAB — COMPREHENSIVE METABOLIC PANEL
ALT: 22 U/L (ref 0–44)
AST: 27 U/L (ref 15–41)
Albumin: 3.4 g/dL — ABNORMAL LOW (ref 3.5–5.0)
Alkaline Phosphatase: 60 U/L (ref 38–126)
Anion gap: 7 (ref 5–15)
BUN: 22 mg/dL (ref 8–23)
CO2: 27 mmol/L (ref 22–32)
Calcium: 8.9 mg/dL (ref 8.9–10.3)
Chloride: 104 mmol/L (ref 98–111)
Creatinine, Ser: 1.21 mg/dL (ref 0.61–1.24)
GFR, Estimated: >60 mL/min (ref >60)
Glucose, Bld: 117 mg/dL — ABNORMAL HIGH (ref 70–99)
Potassium: 4.8 mmol/L (ref 3.5–5.1)
Sodium: 138 mmol/L (ref 135–145)
Total Bilirubin: 0.7 mg/dL (ref 0.3–1.2)
Total Protein: 6.9 g/dL (ref 6.5–8.1)

## 2022-05-21 LAB — CBC
HCT: 37.2 % — ABNORMAL LOW (ref 39.0–52.0)
Hemoglobin: 12.6 g/dL — ABNORMAL LOW (ref 13.0–17.0)
MCH: 32.2 pg (ref 26.0–34.0)
MCHC: 33.9 g/dL (ref 30.0–36.0)
MCV: 95.1 fL (ref 80.0–100.0)
Platelets: 143 10*3/uL — ABNORMAL LOW (ref 150–400)
RBC: 3.91 MIL/uL — ABNORMAL LOW (ref 4.22–5.81)
RDW: 14.2 % (ref 11.5–15.5)
WBC: 7.8 10*3/uL (ref 4.0–10.5)
nRBC: 0 % (ref 0.0–0.2)

## 2022-05-21 MED ORDER — ENALAPRIL MALEATE 2.5 MG PO TABS
10.0000 mg | ORAL_TABLET | Freq: Every day | ORAL | Status: DC
  Administered 2022-05-21 – 2022-05-22 (×2): 10 mg via ORAL
  Filled 2022-05-21 (×2): qty 4

## 2022-05-21 MED ORDER — MAGNESIUM 250 MG PO TABS
250.0000 mg | ORAL_TABLET | Freq: Every evening | ORAL | Status: DC
Start: 2022-05-21 — End: 2022-05-21

## 2022-05-21 MED ORDER — GUAIFENESIN ER 600 MG PO TB12
1200.0000 mg | ORAL_TABLET | Freq: Two times a day (BID) | ORAL | Status: DC
  Administered 2022-05-21 – 2022-05-22 (×4): 1200 mg via ORAL
  Filled 2022-05-21 (×4): qty 2

## 2022-05-21 MED ORDER — MAGNESIUM OXIDE -MG SUPPLEMENT 400 (240 MG) MG PO TABS
400.0000 mg | ORAL_TABLET | Freq: Every day | ORAL | Status: DC
  Administered 2022-05-21 – 2022-05-22 (×2): 400 mg via ORAL
  Filled 2022-05-21 (×2): qty 1

## 2022-05-21 MED ORDER — POTASSIUM CHLORIDE CRYS ER 20 MEQ PO TBCR
20.0000 meq | EXTENDED_RELEASE_TABLET | Freq: Every day | ORAL | Status: DC
Start: 2022-05-21 — End: 2022-05-23
  Administered 2022-05-21 – 2022-05-22 (×2): 20 meq via ORAL
  Filled 2022-05-21 (×2): qty 1

## 2022-05-21 NOTE — Anesthesia Postprocedure Evaluation (Signed)
Anesthesia Post Note  Patient: Samuel Ferrell  Procedure(s) Performed: ROBOTIC ASSISTED NAVIGATIONAL BRONCHOSCOPY DYE MARKING VIDEO BRONCHOSCOPY WITH RADIAL ENDOBRONCHIAL ULTRASOUND BRONCHIAL NEEDLE ASPIRATION BIOPSIES BRONCHIAL BRUSHINGS     Patient location during evaluation: PACU Anesthesia Type: General Level of consciousness: sedated and patient cooperative Pain management: pain level controlled Vital Signs Assessment: post-procedure vital signs reviewed and stable Respiratory status: spontaneous breathing Cardiovascular status: stable Anesthetic complications: no   No notable events documented.  Last Vitals:  Vitals:   05/21/22 1928 05/21/22 2029  BP:  (!) 152/76  Pulse: 84 85  Resp: 18 (!) 26  Temp:  36.4 C  SpO2: 94% 96%    Last Pain:  Vitals:   05/21/22 2029  TempSrc: Oral  PainSc: Shell

## 2022-05-21 NOTE — Progress Notes (Signed)
2 Days Post-Op Procedure(s) (LRB): XI ROBOTIC ASSISTED THORACOSCOPY-LEFT UPPER LOBECTOMY (Left) INTERCOSTAL NERVE BLOCK (Left) LYMPH NODE DISSECTION (Left) Subjective: Hoarseness persists Pain well controlled majority of the time  Objective: Vital signs in last 24 hours: Temp:  [97.7 F (36.5 C)-98.5 F (36.9 C)] 97.8 F (36.6 C) (09/10 0734) Pulse Rate:  [64-81] 74 (09/10 0800) Cardiac Rhythm: Heart block (09/10 0700) Resp:  [17-21] 17 (09/10 0800) BP: (123-175)/(47-75) 129/61 (09/10 0800) SpO2:  [95 %-98 %] 95 % (09/10 0800)  Hemodynamic parameters for last 24 hours:    Intake/Output from previous day: 09/09 0701 - 09/10 0700 In: 1080 [P.O.:1080] Out: 680 [Urine:250; Chest Tube:430] Intake/Output this shift: No intake/output data recorded.  General appearance: alert, cooperative, and no distress Neurologic: intact Heart: regular rate and rhythm Lungs: clear to auscultation bilaterally No air leak but weak cough  Lab Results: Recent Labs    05/20/22 0712 05/21/22 0635  WBC 8.2 7.8  HGB 12.3* 12.6*  HCT 35.2* 37.2*  PLT 143* 143*   BMET:  Recent Labs    05/20/22 0712 05/21/22 0635  NA 137 138  K 4.0 4.8  CL 108 104  CO2 20* 27  GLUCOSE 113* 117*  BUN 23 22  CREATININE 1.47* 1.21  CALCIUM 8.0* 8.9    PT/INR: No results for input(s): "LABPROT", "INR" in the last 72 hours. ABG    Component Value Date/Time   PHART 7.44 05/17/2022 1345   HCO3 24.5 05/17/2022 1345   TCO2 26 01/05/2022 0708   O2SAT 99.5 05/17/2022 1345   CBG (last 3)  No results for input(s): "GLUCAP" in the last 72 hours.  Assessment/Plan: S/P Procedure(s) (LRB): XI ROBOTIC ASSISTED THORACOSCOPY-LEFT UPPER LOBECTOMY (Left) INTERCOSTAL NERVE BLOCK (Left) LYMPH NODE DISSECTION (Left) POD # 2 Overall doing well except for hoarseness Will add mucinex to assist with secretion clearance No air leak but drained 430 ml last 24 hours- keep tube one more day Continue ambulation In SR  on Tikosyn Resume Eliquis when CT out Low dose enoxaparin until then  LOS: 2 days    Samuel Ferrell 05/21/2022

## 2022-05-22 ENCOUNTER — Inpatient Hospital Stay (HOSPITAL_COMMUNITY): Payer: No Typology Code available for payment source

## 2022-05-22 ENCOUNTER — Encounter (HOSPITAL_COMMUNITY): Payer: Self-pay | Admitting: Thoracic Surgery (Cardiothoracic Vascular Surgery)

## 2022-05-22 LAB — BASIC METABOLIC PANEL
Anion gap: 10 (ref 5–15)
BUN: 26 mg/dL — ABNORMAL HIGH (ref 8–23)
CO2: 21 mmol/L — ABNORMAL LOW (ref 22–32)
Calcium: 8.8 mg/dL — ABNORMAL LOW (ref 8.9–10.3)
Chloride: 104 mmol/L (ref 98–111)
Creatinine, Ser: 1.38 mg/dL — ABNORMAL HIGH (ref 0.61–1.24)
GFR, Estimated: 54 mL/min — ABNORMAL LOW (ref >60)
Glucose, Bld: 111 mg/dL — ABNORMAL HIGH (ref 70–99)
Potassium: 4.3 mmol/L (ref 3.5–5.1)
Sodium: 135 mmol/L (ref 135–145)

## 2022-05-22 MED ORDER — GUAIFENESIN ER 600 MG PO TB12: 1200.0000 mg | ORAL_TABLET | Freq: Two times a day (BID) | ORAL | Status: DC | PRN

## 2022-05-22 MED ORDER — APIXABAN 5 MG PO TABS: 5.0000 mg | ORAL_TABLET | Freq: Two times a day (BID) | ORAL | Status: DC

## 2022-05-22 MED ORDER — SORBITOL 70 % SOLN
30.0000 mL | Freq: Once | Status: AC
  Administered 2022-05-22: 30 mL via ORAL
  Filled 2022-05-22: qty 30

## 2022-05-22 MED ORDER — APIXABAN 5 MG PO TABS
5.0000 mg | ORAL_TABLET | Freq: Two times a day (BID) | ORAL | Status: DC
  Administered 2022-05-22 (×2): 5 mg via ORAL
  Filled 2022-05-22 (×2): qty 1

## 2022-05-22 MED ORDER — CALCIUM CARBONATE ANTACID 500 MG PO CHEW
1.0000 | CHEWABLE_TABLET | ORAL | Status: DC | PRN
  Administered 2022-05-22: 200 mg via ORAL
  Filled 2022-05-22: qty 1

## 2022-05-22 NOTE — Progress Notes (Addendum)
      [address]       [place] [address]             [phone]       3 Days Post-Op Procedure(s) (LRB): XI ROBOTIC ASSISTED THORACOSCOPY-LEFT UPPER LOBECTOMY (Left) INTERCOSTAL NERVE BLOCK (Left) LYMPH NODE DISSECTION (Left)  Subjective: Patient with persistent hoarseness. He is also asking for a laxative this am.  Objective: Vital signs in last 24 hours: Temp:  [97.6 F (36.4 C)-97.9 F (36.6 C)] 97.9 F (36.6 C) (09/11 0346) Pulse Rate:  [68-85] 68 (09/11 0346) Cardiac Rhythm: Normal sinus rhythm (09/10 2029) Resp:  [16-26] 18 (09/11 0346) BP: (116-174)/(57-99) 168/72 (09/11 0346) SpO2:  [92 %-98 %] 96 % (09/11 0346)     Intake/Output from previous day: 09/10 0701 - 09/11 0700 In: 680 [P.O.:680] Out: 990 [Urine:900; Chest Tube:90]   Physical Exam:  Cardiovascular: RRR Pulmonary: Clear to auscultation on the right, coarse like on left (chest tube rub) Abdomen: Soft, non tender, bowel sounds present. Extremities: No lower extremity edema. Wounds: Clean and dry.  No erythema or signs of infection. Chest Tube: to water seal, no air leak  Lab Results: CBC: Recent Labs    05/20/22 0712 05/21/22 0635  WBC 8.2 7.8  HGB 12.3* 12.6*  HCT 35.2* 37.2*  PLT 143* 143*   BMET:  Recent Labs    05/20/22 0712 05/21/22 0635  NA 137 138  K 4.0 4.8  CL 108 104  CO2 20* 27  GLUCOSE 113* 117*  BUN 23 22  CREATININE 1.47* 1.21  CALCIUM 8.0* 8.9    PT/INR: No results for input(s): "LABPROT", "INR" in the last 72 hours. ABG:  INR: Will add last result for INR, ABG once components are confirmed Will add last 4 CBG results once components are confirmed  Assessment/Plan:  1. CV - History of atrial fibrillation. Maintaining SR, hypertensive. On Tikosyn 500 mcg bid, Lopressor 75 mg bid. If creatinine ok this am, will restart Enalapril. Will restart Apixaban after CT removal later today and stop Lovenox.  2.  Pulmonary - On room air. Left chest  tube with 90 cc last 24 hours. Chest tube is to water seal;no air leak. CXR this am appears stable (? small left lateral ptx, similar left lateral chest wall subcutaneous emphysema). Continue Incruse Ellipta and Mucinex. Likely remove chest tube. Await final pathology. 3. Expected post op blood loss anemia-H and H yesterday stable at 12.6 and 37.2  Donielle M ZimmermanPA-C 05/22/2022,7:13 AM [phone]   Patient seen and examined, agree with above Dc chest tube Creatinine back near baseline. Recheck BMET in AM Path pending  Revonda Standard. Roxan Hockey, MD [facility] [phone]

## 2022-05-22 NOTE — Progress Notes (Signed)
Mobility Specialist Progress Note    05/22/22 1034  Mobility  Activity Ambulated with assistance in hallway  Level of Assistance Contact guard assist, steadying assist  Assistive Device Other (Comment) (HHA)  Distance Ambulated (ft) 420 ft  Activity Response Tolerated well  $Mobility charge 1 Mobility   Pre-Mobility: 76 HR, 95% SpO2 During Mobility: 90 HR Post-Mobility: 87 HR, 97% SpO2  Pt received in chair and agreeable. C/o some heartburn. Returned to chair with call bell in reach.    Hildred Alamin Mobility Specialist

## 2022-05-22 NOTE — TOC Progression Note (Signed)
Transition of Care [facility]) - Progression Note    Patient Details  Name: Samuel Ferrell MRN: [id number] Date of Birth: [birth year]  Transition of Care [facility]) CM/SW Contact  Zenon Mayo, RN Phone Number: 05/22/2022, 11:31 AM  Clinical Narrative:    From home, present with left upper lobe pulmonary nodule, s/p robotic lobectomy and lymph node dissection, chest to come out today, plan for possible dc today or tomorrow, has no needs. TOC following.         Expected Discharge Plan and Services                                                 Social Determinants of Health (SDOH) Interventions    Readmission Risk Interventions     No data to display

## 2022-05-23 ENCOUNTER — Inpatient Hospital Stay (HOSPITAL_COMMUNITY): Payer: No Typology Code available for payment source

## 2022-05-23 LAB — BASIC METABOLIC PANEL
Anion gap: 7 (ref 5–15)
BUN: 28 mg/dL — ABNORMAL HIGH (ref 8–23)
CO2: 24 mmol/L (ref 22–32)
Calcium: 8.6 mg/dL — ABNORMAL LOW (ref 8.9–10.3)
Chloride: 105 mmol/L (ref 98–111)
Creatinine, Ser: 1.24 mg/dL (ref 0.61–1.24)
GFR, Estimated: >60 mL/min (ref >60)
Glucose, Bld: 123 mg/dL — ABNORMAL HIGH (ref 70–99)
Potassium: 4 mmol/L (ref 3.5–5.1)
Sodium: 136 mmol/L (ref 135–145)

## 2022-05-23 LAB — CYTOLOGY - NON PAP

## 2022-05-23 LAB — MAGNESIUM: Magnesium: 2.2 mg/dL (ref 1.7–2.4)

## 2022-05-23 LAB — SURGICAL PATHOLOGY

## 2022-05-23 MED ORDER — OXYCODONE HCL 5 MG PO TABS
5.0000 mg | ORAL_TABLET | Freq: Four times a day (QID) | ORAL | 0 refills | Status: DC | PRN
Start: 2022-05-23 — End: 2022-06-06

## 2022-05-23 NOTE — Progress Notes (Addendum)
      [address]       [place] [address]             [phone]       4 Days Post-Op Procedure(s) (LRB): XI ROBOTIC ASSISTED THORACOSCOPY-LEFT UPPER LOBECTOMY (Left) INTERCOSTAL NERVE BLOCK (Left) LYMPH NODE DISSECTION (Left)  Subjective: Patient with persistent hoarseness. He had a large bowel movement with laxative yesterday. He wants to go home.  Objective: Vital signs in last 24 hours: Temp:  [97.6 F (36.4 C)-98.1 F (36.7 C)] 98.1 F (36.7 C) (09/12 0323) Pulse Rate:  [72-107] 78 (09/12 0323) Cardiac Rhythm: Normal sinus rhythm (09/11 1901) Resp:  [17-20] 19 (09/12 0323) BP: (114-137)/(56-64) 137/60 (09/12 0323) SpO2:  [95 %-98 %] 98 % (09/12 0323)     Intake/Output from previous day: 09/11 0701 - 09/12 0700 In: 120 [P.O.:120] Out: 0    Physical Exam:  Cardiovascular: RRR Pulmonary: Clear to auscultation on the right, coarse like on left (chest tube rub) Abdomen: Soft, non tender, bowel sounds present. Extremities: No lower extremity edema. Wounds: Clean and dry.  No erythema or signs of infection. Trace sero sanguinous drainage from chest tube wound  Lab Results: CBC: Recent Labs    05/20/22 0712 05/21/22 0635  WBC 8.2 7.8  HGB 12.3* 12.6*  HCT 35.2* 37.2*  PLT 143* 143*    BMET:  Recent Labs    05/21/22 0635 05/22/22 0629  NA 138 135  K 4.8 4.3  CL 104 104  CO2 27 21*  GLUCOSE 117* 111*  BUN 22 26*  CREATININE 1.21 1.38*  CALCIUM 8.9 8.8*     PT/INR: No results for input(s): "LABPROT", "INR" in the last 72 hours. ABG:  INR: Will add last result for INR, ABG once components are confirmed Will add last 4 CBG results once components are confirmed  Assessment/Plan:  1. CV - History of chronic atrial fibrillation. Maintaining SR. On Tikosyn 500 mcg bid, Lopressor 75 mg bid, and Apixaban 5 mg bid. 2.  Pulmonary - On room air. Left chest tube with 90 cc last 24 hours. Chest tube is to water seal;no air leak. CXR  this am appears stable ( similar left lateral chest wall subcutaneous emphysema). Continue Incruse Ellipta and Mucinex. Await final pathology. 3. Expected post op blood loss anemia-H and H yesterday stable at 12.6 and 37.2 4. Creatinine decreased from 1.38 to 1.24. Creatinine around 1.3-1.5 prior to surgery. 5. Discharge once CXR officially interpreted  Sharalyn Ink [facility] 05/23/2022,7:09 AM [phone]   Patient seen and examined, agree with above Path still pending CXR shows some loculated fluid +/- atelectasis- will follow up as an outpatient [place] for [place]. Roxan Hockey, MD [facility] [phone]

## 2022-05-23 NOTE — Progress Notes (Signed)
RN went over d/c summary w/ pt. Belongings w/ pt. NT removed PIV and is transporting pt to d/c lounge where pt's family will pick pt up.

## 2022-05-30 ENCOUNTER — Encounter: Payer: No Typology Code available for payment source | Admitting: Thoracic Surgery (Cardiothoracic Vascular Surgery)

## 2022-06-01 ENCOUNTER — Other Ambulatory Visit: Payer: Self-pay | Admitting: *Deleted

## 2022-06-01 NOTE — Progress Notes (Signed)
The proposed treatment discussed in conference is for discussion purpose only and is not a binding recommendation.  The patients have not been physically examined, or presented with their treatment options.  Therefore, final treatment plans cannot be decided.  

## 2022-06-05 ENCOUNTER — Other Ambulatory Visit: Payer: Self-pay | Admitting: Thoracic Surgery (Cardiothoracic Vascular Surgery)

## 2022-06-05 DIAGNOSIS — R911 Solitary pulmonary nodule: Secondary | ICD-10-CM

## 2022-06-06 ENCOUNTER — Ambulatory Visit (INDEPENDENT_AMBULATORY_CARE_PROVIDER_SITE_OTHER): Payer: Self-pay | Admitting: Thoracic Surgery (Cardiothoracic Vascular Surgery)

## 2022-06-06 ENCOUNTER — Ambulatory Visit
Admission: RE | Admit: 2022-06-06 | Discharge: 2022-06-06 | Disposition: A | Discharge status: Home / Self Care | Payer: No Typology Code available for payment source | Source: Ambulatory Visit | Attending: Thoracic Surgery (Cardiothoracic Vascular Surgery) | Admitting: Thoracic Surgery (Cardiothoracic Vascular Surgery)

## 2022-06-06 ENCOUNTER — Ambulatory Visit: Payer: No Typology Code available for payment source

## 2022-06-06 ENCOUNTER — Telehealth: Payer: Self-pay | Admitting: Internal Medicine

## 2022-06-06 VITALS — BP 107/60 | HR 72 | Resp 20 | Ht 73.0 in | Wt 202.0 lb

## 2022-06-06 DIAGNOSIS — Z902 Acquired absence of lung [part of]: Secondary | ICD-10-CM

## 2022-06-06 DIAGNOSIS — Z9889 Other specified postprocedural states: Secondary | ICD-10-CM

## 2022-06-06 DIAGNOSIS — R911 Solitary pulmonary nodule: Secondary | ICD-10-CM

## 2022-06-06 MED ORDER — OXYCODONE HCL 5 MG PO TABS: 5.0000 mg | ORAL_TABLET | Freq: Four times a day (QID) | ORAL | 0 refills | Status: DC | PRN

## 2022-06-06 MED ORDER — PREGABALIN 50 MG PO CAPS: 50.0000 mg | ORAL_CAPSULE | Freq: Two times a day (BID) | ORAL | 3 refills | Status: DC

## 2022-06-06 NOTE — Progress Notes (Signed)
[address]       [place] [address]             [phone]    HPI: Samuel Ferrell returns for scheduled postoperative follow-up after recent lobectomy  Samuel Ferrell is a 76 year old man with a history of tobacco abuse, COPD, hypertension, hyperlipidemia, atrial fibrillation, ablation, atypical atrial flutter, TIA, anemia, reflux, melanoma, and ethanol abuse.  He was found to have a nodule on a low-dose screening CT.  He ultimately had a PET/CT which showed the nodule is hypermetabolic.  Navigational bronchoscopy was nondiagnostic.  He underwent a combined navigational bronchoscopy for marking followed by left upper lobectomy on 05/19/2022.  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  Postoperative course was notable for hoarseness.  Otherwise unremarkable.  Since discharge she has been having some issues with pain along the left costal margin.  He has been using oxycodone about twice a day for that.  He does have some tenderness in that area.  No respiratory issues.  He does continue to smoke, but says he is cut back to a few cigarettes a day.  Past Medical History:  Diagnosis Date   Afib [facility])    s/p ablation x2 by Dr [name] at [facility] in 2007, 2009   Alcohol abuse    Anemia    Atypical atrial flutter (HCC)    COPD (chronic obstructive pulmonary disease) ([place])    COVID    mild case   CVA (cerebral infarction)    GERD (gastroesophageal reflux disease)    Heart murmur    Hyperlipidemia    Hypertension    Palpitations    Pneumonia    1970's   Skin cancer--melanoma    Stroke (HCC)    TIA    Current Outpatient Medications  Medication Sig Dispense Refill   apixaban (ELIQUIS) 5 MG TABS tablet Take 5 mg by mouth 2 (two) times daily.     aspirin EC 81 MG tablet Take 81 mg by mouth daily. Swallow whole.     atorvastatin (LIPITOR) 40 MG tablet Take 40 mg by mouth every evening.      dofetilide (TIKOSYN) 500 MCG capsule Take 1 capsule (500 mcg  total) by mouth 2 (two) times daily. 180 capsule 2   enalapril (VASOTEC) 10 MG tablet Take 1 tablet (10 mg total) by mouth at bedtime. 90 tablet 3   ferrous sulfate 325 (65 FE) MG tablet Take 325 mg by mouth daily with breakfast.     finasteride (PROSCAR) 5 MG tablet Take 5 mg by mouth daily.     guaiFENesin (MUCINEX) 600 MG 12 hr tablet Take 2 tablets (1,200 mg total) by mouth 2 (two) times daily as needed for to loosen phlegm or cough.     Magnesium 250 MG TABS Take 250 mg by mouth every evening.     metoprolol tartrate (LOPRESSOR) 50 MG tablet Take 1.5 tablets (75 mg total) by mouth 2 (two) times daily. 270 tablet 3   Multiple Vitamin (MULTIVITAMIN) tablet Take 1 tablet by mouth daily.     Multiple Vitamins-Minerals (ONE-A-DAY MENS HEALTH FORMULA PO) Take 1 tablet by mouth daily.     omeprazole (PRILOSEC) 20 MG capsule Take 20 mg by mouth daily.     potassium chloride SA (KLOR-CON M20) 20 MEQ tablet Take 1 tablet (20 mEq total) by mouth daily. 90 tablet 2   pregabalin (LYRICA) 50 MG capsule Take 1 capsule (50 mg total)  by mouth 2 (two) times daily. 60 capsule 3   Tiotropium Bromide-Olodaterol (STIOLTO RESPIMAT) 2.5-2.5 MCG/ACT AERS Inhale 2 puffs into the lungs daily. 4 g 11   vitamin C (ASCORBIC ACID) 250 MG tablet Take 250 mg by mouth daily.     oxyCODONE (OXY IR/ROXICODONE) 5 MG immediate release tablet Take 1 tablet (5 mg total) by mouth every 6 (six) hours as needed for moderate pain. 28 tablet 0   No current facility-administered medications for this visit.    Physical Exam BP 107/60 (BP Location: Left Arm, Patient Position: Sitting)   Pulse 72   Resp 20   Ht 6\' 1"  (1.854 m)   Wt 202 lb (91.6 kg)   SpO2 98% Comment: RA  BMI 26.5 kg/m  76 year old man in no acute distress Alert and oriented x3.  Hoarse voice, otherwise intact Lungs diminished left base and anteriorly, otherwise clear Incisions clean dry and intact Cardiac regular rate and rhythm No peripheral  edema   Diagnostic Tests: I personally reviewed his chest x-ray.  Shows postoperative changes from left upper lobectomy.  Impression: Samuel Ferrell is a 76 year old man with a history of tobacco abuse, COPD, hypertension, hyperlipidemia, atrial fibrillation, ablation, atypical atrial flutter, TIA, anemia, reflux, melanoma, and ethanol abuse.  He was originally found to have a left upper lobe lung nodule on a low-dose CT for lung cancer screening.  Status post left upper lobectomy.-Stage Ia (T1, N0) adenocarcinoma.  Should not need any adjuvant therapy.  Will refer to Dr. Julien Nordmann for medical oncology consultation and long-term follow-up.  Hoarseness-recurrent nerve dysfunction associated with lymph node dissection.  Usually will improve over time.  No swallowing issues.  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 twice daily.  Renewed his prescription for oxycodone 5 mg p.o. every 6 hours as needed, 30 tablets, no refills.  Plan: Refer to Dr. Julien Nordmann. I will plan to see Samuel Ferrell back in a month to check on his progress.  He knows to call if he has any issues in the meantime.  Melrose Nakayama, MD [facility] [phone]

## 2022-06-06 NOTE — Telephone Encounter (Signed)
Scheduled appt per 9/26 referral. Pt is aware of appt date and time. Pt is aware to arrive 15 mins prior to appt time and to bring and updated insurance card. Pt is aware of appt location.   

## 2022-06-23 ENCOUNTER — Other Ambulatory Visit: Payer: Self-pay | Admitting: Internal Medicine

## 2022-06-23 DIAGNOSIS — C3492 Malignant neoplasm of unspecified part of left bronchus or lung: Secondary | ICD-10-CM

## 2022-06-26 ENCOUNTER — Inpatient Hospital Stay: Payer: No Typology Code available for payment source

## 2022-06-26 ENCOUNTER — Inpatient Hospital Stay: Payer: No Typology Code available for payment source | Attending: Internal Medicine | Admitting: Internal Medicine

## 2022-06-26 ENCOUNTER — Encounter: Payer: Self-pay | Admitting: Internal Medicine

## 2022-06-26 ENCOUNTER — Other Ambulatory Visit: Payer: Self-pay

## 2022-06-26 VITALS — BP 140/69 | HR 81 | Temp 97.4°F | Resp 16 | Wt 203.6 lb

## 2022-06-26 DIAGNOSIS — Z85820 Personal history of malignant melanoma of skin: Secondary | ICD-10-CM | POA: Diagnosis not present

## 2022-06-26 DIAGNOSIS — C3412 Malignant neoplasm of upper lobe, left bronchus or lung: Secondary | ICD-10-CM | POA: Diagnosis present

## 2022-06-26 DIAGNOSIS — F17210 Nicotine dependence, cigarettes, uncomplicated: Secondary | ICD-10-CM | POA: Diagnosis not present

## 2022-06-26 DIAGNOSIS — Z801 Family history of malignant neoplasm of trachea, bronchus and lung: Secondary | ICD-10-CM

## 2022-06-26 DIAGNOSIS — C3492 Malignant neoplasm of unspecified part of left bronchus or lung: Secondary | ICD-10-CM

## 2022-06-26 DIAGNOSIS — I10 Essential (primary) hypertension: Secondary | ICD-10-CM | POA: Diagnosis not present

## 2022-06-26 LAB — CMP (CANCER CENTER ONLY)
ALT: 30 U/L (ref 0–44)
AST: 19 U/L (ref 15–41)
Albumin: 4.2 g/dL (ref 3.5–5.0)
Alkaline Phosphatase: 97 U/L (ref 38–126)
Anion gap: 6 (ref 5–15)
BUN: 17 mg/dL (ref 8–23)
CO2: 26 mmol/L (ref 22–32)
Calcium: 9.3 mg/dL (ref 8.9–10.3)
Chloride: 105 mmol/L (ref 98–111)
Creatinine: 1.26 mg/dL — ABNORMAL HIGH (ref 0.61–1.24)
GFR, Estimated: 60 mL/min — ABNORMAL LOW (ref >60)
Glucose, Bld: 103 mg/dL — ABNORMAL HIGH (ref 70–99)
Potassium: 4.2 mmol/L (ref 3.5–5.1)
Sodium: 137 mmol/L (ref 135–145)
Total Bilirubin: 0.3 mg/dL (ref 0.3–1.2)
Total Protein: 7.3 g/dL (ref 6.5–8.1)

## 2022-06-26 LAB — CBC WITH DIFFERENTIAL (CANCER CENTER ONLY)
Abs Immature Granulocytes: 0.02 10*3/uL (ref 0.00–0.07)
Basophils Absolute: 0.1 10*3/uL (ref 0.0–0.1)
Basophils Relative: 1 %
Eosinophils Absolute: 0.2 10*3/uL (ref 0.0–0.5)
Eosinophils Relative: 3 %
HCT: 36.6 % — ABNORMAL LOW (ref 39.0–52.0)
Hemoglobin: 12.3 g/dL — ABNORMAL LOW (ref 13.0–17.0)
Immature Granulocytes: 0 %
Lymphocytes Relative: 20 %
Lymphs Abs: 1.3 10*3/uL (ref 0.7–4.0)
MCH: 31.7 pg (ref 26.0–34.0)
MCHC: 33.6 g/dL (ref 30.0–36.0)
MCV: 94.3 fL (ref 80.0–100.0)
Monocytes Absolute: 0.6 10*3/uL (ref 0.1–1.0)
Monocytes Relative: 9 %
Neutro Abs: 4.4 10*3/uL (ref 1.7–7.7)
Neutrophils Relative %: 67 %
Platelet Count: 179 10*3/uL (ref 150–400)
RBC: 3.88 MIL/uL — ABNORMAL LOW (ref 4.22–5.81)
RDW: 14.4 % (ref 11.5–15.5)
WBC Count: 6.5 10*3/uL (ref 4.0–10.5)
nRBC: 0 % (ref 0.0–0.2)

## 2022-06-26 NOTE — Progress Notes (Signed)
[place] Telephone:([phone]) [phone]   Fax:([phone]) [phone]  CONSULT NOTE  REFERRING PHYSICIAN: Dr. Modesto Charon  REASON FOR CONSULTATION:  76 years old white male recently diagnosed with lung cancer  HPI Samuel Ferrell is a 76 y.o. male with past medical history significant for hypertension, dyslipidemia, heart murmur, GERD, stroke, COPD, atrial flutter, anemia, alcohol abuse, atrial fibrillation as well as melanoma and history of pneumonia.  The patient also has a long history of smoking.  He was followed by the CT screening for lung cancer and CT scan of the chest on September 15, 2021 at [facility] showed a solid 0.78 cm left upper lobe nodule with cloverleaf appearance.  He was referred to Dr. Verlee Monte and a PET scan on 04/12/2022 showed hypermetabolic left upper lobe pulmonary nodule suspicious for primary bronchogenic carcinoma with no other hypermetabolic nodules or evidence of metastatic disease.  On 04/24/2022 the patient underwent video bronchoscopy with robotic assisted bronchoscopic navigation under the care of Dr. Verlee Monte.  The final pathology was not diagnostic for malignancy.  The patient was then referred to Dr. Roxan Hockey and on 05/19/2022 he underwent repeat navigational bronchoscopy again followed by robotic assisted left thoracoscopy with left upper lobectomy and lymph node dissection under the care of Dr. Roxan Hockey.  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 The patient was referred to me today for evaluation and recommendation regarding close monitoring of his condition. When seen today he continues to complain of scratchy throat as well as left-sided soreness and mild shortness of breath after the surgery.  He denied having any cough or hemoptysis.  He has no  nausea, vomiting, diarrhea or constipation.  He has no headache or visual changes. Family history is unknown since he left his parents when he was young and never had contact with them after that. Social history the patient is single but he has a girlfriend for the last 4 years.  He has 2 children a son and daughter and they live in [place] and they have no contact with him.  He used to work in [employer]. He has a history for smoking 1 pack/day for around 60 years and unfortunately he continues to smoke and not willing to quit. He has a history of alcohol abuse but quit around 4 years ago.  He has no history of drug abuse.  HPI  Past Medical History:  Diagnosis Date   Afib [facility])    s/p ablation x2 by Dr [name] at [facility] in 2007, 2009   Alcohol abuse    Anemia    Atypical atrial flutter ([place])    COPD (chronic obstructive pulmonary disease) ([place])    COVID    mild case   CVA (cerebral infarction)    GERD (gastroesophageal reflux disease)    Heart murmur    Hyperlipidemia    Hypertension    Palpitations    Pneumonia    1970's   Skin cancer--melanoma    Stroke [facility])    TIA    Past Surgical History:  Procedure Laterality Date   ANTERIOR CERVICAL DECOMP/DISCECTOMY FUSION N/A 07/24/2018   Procedure: ANTERIOR CERVICAL DECOMPRESSION/DISCECTOMY FUSION, INTERBODY PROSTHESIS, PLATE SCREWS CERVICAL FIVE- CERVICAL SIX;  Surgeon: Newman Pies, MD;  Location: [place];  Service: Neurosurgery;  Laterality: N/A;  ANTERIOR CERVICAL DECOMPRESSION/DISCECTOMY FUSION, INTERBODY PROSTHESIS, PLATE SCREWS CERVICAL FIVE-  CERVICAL SIX   APPENDECTOMY     ATRIAL FIBRILLATION ABLATION  2007 ,2009   Dr [name] at [place]  04/24/2022   Procedure: BRONCHIAL BIOPSIES;  Surgeon: Maryjane Hurter, MD;  Location: [facility] ENDOSCOPY;  Service: Pulmonary;;   BRONCHIAL BRUSHINGS  04/24/2022   Procedure: BRONCHIAL BRUSHINGS;  Surgeon: Maryjane Hurter, MD;  Location: [facility] ENDOSCOPY;  Service:  Pulmonary;;   BRONCHIAL BRUSHINGS  05/19/2022   Procedure: BRONCHIAL BRUSHINGS;  Surgeon: Maryjane Hurter, MD;  Location: [facility] ENDOSCOPY;  Service: Pulmonary;;   BRONCHIAL NEEDLE ASPIRATION BIOPSY  04/24/2022   Procedure: BRONCHIAL NEEDLE ASPIRATION BIOPSIES;  Surgeon: Maryjane Hurter, MD;  Location: [facility] ENDOSCOPY;  Service: Pulmonary;;   BRONCHIAL NEEDLE ASPIRATION BIOPSY  05/19/2022   Procedure: BRONCHIAL NEEDLE ASPIRATION BIOPSIES;  Surgeon: Maryjane Hurter, MD;  Location: [facility] ENDOSCOPY;  Service: Pulmonary;;   BRONCHIAL WASHINGS  04/24/2022   Procedure: BRONCHIAL WASHINGS;  Surgeon: Maryjane Hurter, MD;  Location: [facility] ENDOSCOPY;  Service: Pulmonary;;   EYE SURGERY Bilateral    cataracts   FIDUCIAL MARKER PLACEMENT  05/19/2022   Procedure: DYE MARKING;  Surgeon: Maryjane Hurter, MD;  Location: [facility] ENDOSCOPY;  Service: Pulmonary;;   FRACTURE SURGERY     right clavicle   HEMOSTASIS CONTROL  04/24/2022   Procedure: HEMOSTASIS CONTROL;  Surgeon: Maryjane Hurter, MD;  Location: [facility] ENDOSCOPY;  Service: Pulmonary;;   INTERCOSTAL NERVE BLOCK Left 05/19/2022   Procedure: INTERCOSTAL NERVE BLOCK;  Surgeon: Melrose Nakayama, MD;  Location: [place];  Service: Thoracic;  Laterality: Left;   LYMPH NODE DISSECTION Left 05/19/2022   Procedure: LYMPH NODE DISSECTION;  Surgeon: Melrose Nakayama, MD;  Location: [place];  Service: Thoracic;  Laterality: Left;   PERIPHERAL VASCULAR INTERVENTION Bilateral 01/05/2022   Procedure: PERIPHERAL VASCULAR INTERVENTION;  Surgeon: Marty Heck, MD;  Location: [place] CV LAB;  Service: Cardiovascular;  Laterality: Bilateral;  Bilateral Renal Artery Stents   RENAL ANGIOGRAPHY Bilateral 01/05/2022   Procedure: RENAL ARTERY STENTING;  Surgeon: Marty Heck, MD;  Location: [place] CV LAB;  Service: Cardiovascular;  Laterality: Bilateral;   TONSILLECTOMY     VIDEO BRONCHOSCOPY WITH RADIAL ENDOBRONCHIAL ULTRASOUND  04/24/2022   Procedure:  VIDEO BRONCHOSCOPY WITH RADIAL ENDOBRONCHIAL ULTRASOUND;  Surgeon: Maryjane Hurter, MD;  Location: [facility] ENDOSCOPY;  Service: Pulmonary;;   VIDEO BRONCHOSCOPY WITH RADIAL ENDOBRONCHIAL ULTRASOUND  05/19/2022   Procedure: VIDEO BRONCHOSCOPY WITH RADIAL ENDOBRONCHIAL ULTRASOUND;  Surgeon: Maryjane Hurter, MD;  Location: [facility] ENDOSCOPY;  Service: Pulmonary;;    Family History  Problem Relation Age of Onset   Lung cancer Brother        smoked   Cancer Other    Colon cancer Neg Hx    Esophageal cancer Neg Hx    Rectal cancer Neg Hx    Stomach cancer Neg Hx     Social History Social History   Tobacco Use   Smoking status: Every Day    Packs/day: 1.00    Years: 61.00    Total pack years: 61.00    Types: Cigarettes   Smokeless tobacco: Never  Vaping Use   Vaping Use: Never used  Substance Use Topics   Alcohol use: Not Currently    Comment: heavy, up to 6 beers per day   Drug use: No    No Known Allergies  Current Outpatient Medications  Medication Sig Dispense Refill   apixaban (ELIQUIS) 5 MG TABS tablet Take 5 mg by mouth  2 (two) times daily.     aspirin EC 81 MG tablet Take 81 mg by mouth daily. Swallow whole.     atorvastatin (LIPITOR) 40 MG tablet Take 40 mg by mouth every evening.      dofetilide (TIKOSYN) 500 MCG capsule Take 1 capsule (500 mcg total) by mouth 2 (two) times daily. 180 capsule 2   enalapril (VASOTEC) 10 MG tablet Take 1 tablet (10 mg total) by mouth at bedtime. 90 tablet 3   ferrous sulfate 325 (65 FE) MG tablet Take 325 mg by mouth daily with breakfast.     finasteride (PROSCAR) 5 MG tablet Take 5 mg by mouth daily.     guaiFENesin (MUCINEX) 600 MG 12 hr tablet Take 2 tablets (1,200 mg total) by mouth 2 (two) times daily as needed for to loosen phlegm or cough.     Magnesium 250 MG TABS Take 250 mg by mouth every evening.     metoprolol tartrate (LOPRESSOR) 50 MG tablet Take 1.5 tablets (75 mg total) by mouth 2 (two) times daily. 270 tablet 3   Multiple  Vitamin (MULTIVITAMIN) tablet Take 1 tablet by mouth daily.     Multiple Vitamins-Minerals (ONE-A-DAY MENS HEALTH FORMULA PO) Take 1 tablet by mouth daily.     omeprazole (PRILOSEC) 20 MG capsule Take 20 mg by mouth daily.     oxyCODONE (OXY IR/ROXICODONE) 5 MG immediate release tablet Take 1 tablet (5 mg total) by mouth every 6 (six) hours as needed for moderate pain. 28 tablet 0   potassium chloride SA (KLOR-CON M20) 20 MEQ tablet Take 1 tablet (20 mEq total) by mouth daily. 90 tablet 2   pregabalin (LYRICA) 50 MG capsule Take 1 capsule (50 mg total) by mouth 2 (two) times daily. 60 capsule 3   Tiotropium Bromide-Olodaterol (STIOLTO RESPIMAT) 2.5-2.5 MCG/ACT AERS Inhale 2 puffs into the lungs daily. 4 g 11   vitamin C (ASCORBIC ACID) 250 MG tablet Take 250 mg by mouth daily.     No current facility-administered medications for this visit.    Review of Systems  Constitutional: negative Eyes: negative Ears, nose, mouth, throat, and face: negative Respiratory: positive for dyspnea on exertion and pleurisy/chest pain Cardiovascular: negative Gastrointestinal: negative Genitourinary:negative Integument/breast: negative Hematologic/lymphatic: negative Musculoskeletal:negative Neurological: negative Behavioral/Psych: negative Endocrine: negative Allergic/Immunologic: negative  Physical Exam  FGH:WEXHB, healthy, no distress, well nourished, and well developed SKIN: skin color, texture, turgor are normal, no rashes or significant lesions HEAD: Normocephalic, No masses, lesions, tenderness or abnormalities EYES: normal, PERRLA, Conjunctiva are pink and non-injected EARS: External ears normal, Canals clear OROPHARYNX:no exudate, no erythema, and lips, buccal mucosa, and tongue normal  NECK: supple, no adenopathy, no JVD LYMPH:  no palpable lymphadenopathy, no hepatosplenomegaly LUNGS: clear to auscultation , and palpation HEART: regular rate & rhythm, no murmurs, and no  gallops ABDOMEN:abdomen soft, non-tender, normal bowel sounds, and no masses or organomegaly BACK: Back symmetric, no curvature., No CVA tenderness EXTREMITIES:no joint deformities, effusion, or inflammation, no edema  NEURO: alert & oriented x 3 with fluent speech, no focal motor/sensory deficits  PERFORMANCE STATUS: ECOG 1  LABORATORY DATA: Lab Results  Component Value Date   WBC 7.8 05/21/2022   HGB 12.6 (L) 05/21/2022   HCT 37.2 (L) 05/21/2022   MCV 95.1 05/21/2022   PLT 143 (L) 05/21/2022      Chemistry      Component Value Date/Time   NA 136 05/23/2022 0549   NA 139 11/29/2020 0803   K 4.0 05/23/2022  0549   CL 105 05/23/2022 0549   CO2 24 05/23/2022 0549   BUN 28 (H) 05/23/2022 0549   BUN 20 11/29/2020 0803   CREATININE 1.24 05/23/2022 0549      Component Value Date/Time   CALCIUM 8.6 (L) 05/23/2022 0549   ALKPHOS 60 05/21/2022 0635   AST 27 05/21/2022 0635   ALT 22 05/21/2022 0635   BILITOT 0.7 05/21/2022 0635       RADIOGRAPHIC STUDIES: DG Chest 2 View  Result Date: 06/06/2022 CLINICAL DATA:  lung nodule EXAM: CHEST - 2 VIEW COMPARISON:  None Available. FINDINGS: Left-sided lobectomy. Veil like left lung opacity likely relates to layering pleural effusion and/or volume loss. No visible pneumothorax. Resolution of subcutaneous emphysema. Right lung is clear. Cardiomediastinal silhouette is largely obscured. Elevated left hemidiaphragm. IMPRESSION: 1. No visible pneumothorax. 2. Veil like left lung opacity likely relates to layering pleural effusion and/or volume loss status post lobectomy. Electronically Signed   By: Margaretha Sheffield M.D.   On: 06/06/2022 08:50    ASSESSMENT: This is a very pleasant 76 years old white male recently diagnosed with stage IA (T1b, N0, M0) non-small cell lung cancer, adenocarcinoma presented with left upper lobe lung nodule status post left upper lobectomy with mediastinal lymph node dissection under the care of Dr. Roxan Hockey on  05/19/2022.  The tumor size was 1.6 cm with no evidence for visceral pleural or lymphovascular invasion.   PLAN: I had a lengthy discussion with the patient today about his current disease stage, prognosis and treatment options. I explained to the patient that he had a curable treatment for his condition with the surgical resection. I also explained to the patient that there is no survival benefit for adjuvant systemic chemotherapy or radiation for patient with resected stage Ia non-small cell lung cancer and the current standard of care is observation and close monitoring. I recommended for the patient to have repeat CT scan of the chest in 6 months.  The patient mentioned that he would like to get his scan done at the [facility] facility. I will arrange for him to come back for follow-up visit 1 week after his CT scan. We will continue with imaging studies with CT scan of the chest every 6 months for the first 2 years and then annually after that for the total of 5 years. For the smoking cessation I strongly encouraged the patient to quit smoking but he is not willing to quit. He was advised to call immediately if he has any other concerning symptoms in the interval. The patient voices understanding of current disease status and treatment options and is in agreement with the current care plan.  All questions were answered. The patient knows to call the clinic with any problems, questions or concerns. We can certainly see the patient much sooner if necessary.  Thank you so much for allowing me to participate in the care of Samuel Ferrell. I will continue to follow up the patient with you and assist in his care.  The total time spent in the appointment was 60 minutes.  Disclaimer: This note was dictated with voice recognition software. Similar sounding words can inadvertently be transcribed and may not be corrected upon review.   Eilleen Kempf June 26, 2022, 1:34 PM

## 2022-06-26 NOTE — Patient Instructions (Addendum)
Steps to Quit Smoking Smoking tobacco is the leading cause of preventable death. It can affect almost every organ in the body. Smoking puts you and people around you at risk for many serious, long-lasting (chronic) diseases. Quitting smoking can be hard, but it is one of the best things that you can do for your health. It is never too late to quit. Do not give up if you cannot quit the first time. Some people need to try many times to quit. Do your best to stick to your quit plan, and talk with your doctor if you have any questions or concerns. How do I get ready to quit? Pick a date to quit. Set a date within the next 2 weeks to give you time to prepare. Write down the reasons why you are quitting. Keep this list in places where you will see it often. Tell your family, friends, and co-workers that you are quitting. Their support is important. Talk with your doctor about the choices that may help you quit. Find out if your health insurance will pay for these treatments. Know the people, places, things, and activities that make you want to smoke (triggers). Avoid them. What first steps can I take to quit smoking? Throw away all cigarettes at home, at work, and in your car. Throw away the things that you use when you smoke, such as ashtrays and lighters. Clean your car. Empty the ashtray. Clean your home, including curtains and carpets. What can I do to help me quit smoking? Talk with your doctor about taking medicines and seeing a counselor. You are more likely to succeed when you do both. If you are pregnant or breastfeeding: Talk with your doctor about counseling or other ways to quit smoking. Do not take medicine to help you quit smoking unless your doctor tells you to. Quit right away Quit smoking completely, instead of slowly cutting back on how much you smoke over a period of time. Stopping smoking right away may be more successful than slowly quitting. Go to counseling. In-person is best  if this is an option. You are more likely to quit if you go to counseling sessions regularly. Take medicine You may take medicines to help you quit. Some medicines need a prescription, and some you can buy over-the-counter. Some medicines may contain a drug called nicotine to replace the nicotine in cigarettes. Medicines may: Help you stop having the desire to smoke (cravings). Help to stop the problems that come when you stop smoking (withdrawal symptoms). Your doctor may ask you to use: Nicotine patches, gum, or lozenges. Nicotine inhalers or sprays. Non-nicotine medicine that you take by mouth. Find resources Find resources and other ways to help you quit smoking and remain smoke-free after you quit. They include: Online chats with a counselor. Phone quitlines. Printed self-help materials. Support groups or group counseling. Text messaging programs. Mobile phone apps. Use apps on your mobile phone or tablet that can help you stick to your quit plan. Examples of free services include Quit Guide from the CDC and smokefree.gov  What can I do to make it easier to quit?  Talk to your family and friends. Ask them to support and encourage you. Call a phone quitline, such as 1-800-QUIT-NOW, reach out to support groups, or work with a counselor. Ask people who smoke to not smoke around you. Avoid places that make you want to smoke, such as: Bars. Parties. Smoke-break areas at work. Spend time with people who do not smoke. Lower   the stress in your life. Stress can make you want to smoke. Try these things to lower stress: Getting regular exercise. Doing deep-breathing exercises. Doing yoga. Meditating. What benefits will I see if I quit smoking? Over time, you may have: A better sense of smell and taste. Less coughing and sore throat. A slower heart rate. Lower blood pressure. Clearer skin. Better breathing. Fewer sick days. Summary Quitting smoking can be hard, but it is one of  the best things that you can do for your health. Do not give up if you cannot quit the first time. Some people need to try many times to quit. When you decide to quit smoking, make a plan to help you succeed. Quit smoking right away, not slowly over a period of time. When you start quitting, get help and support to keep you smoke-free. This information is not intended to replace advice given to you by your health care provider. Make sure you discuss any questions you have with your health care provider. Document Revised: 08/19/2021 Document Reviewed: 08/19/2021 Elsevier Patient Education  2023 [employer].  

## 2022-07-10 ENCOUNTER — Other Ambulatory Visit: Payer: Self-pay | Admitting: Thoracic Surgery (Cardiothoracic Vascular Surgery)

## 2022-07-10 DIAGNOSIS — R911 Solitary pulmonary nodule: Secondary | ICD-10-CM

## 2022-07-11 ENCOUNTER — Ambulatory Visit
Admission: RE | Admit: 2022-07-11 | Discharge: 2022-07-11 | Disposition: A | Discharge status: Home / Self Care | Payer: No Typology Code available for payment source | Source: Ambulatory Visit | Attending: Thoracic Surgery (Cardiothoracic Vascular Surgery) | Admitting: Thoracic Surgery (Cardiothoracic Vascular Surgery)

## 2022-07-11 ENCOUNTER — Ambulatory Visit (INDEPENDENT_AMBULATORY_CARE_PROVIDER_SITE_OTHER): Payer: Self-pay | Admitting: Thoracic Surgery (Cardiothoracic Vascular Surgery)

## 2022-07-11 VITALS — BP 124/64 | HR 76 | Resp 20 | Ht 73.0 in | Wt 201.0 lb

## 2022-07-11 DIAGNOSIS — Z9889 Other specified postprocedural states: Secondary | ICD-10-CM

## 2022-07-11 DIAGNOSIS — R911 Solitary pulmonary nodule: Secondary | ICD-10-CM

## 2022-07-11 DIAGNOSIS — Z902 Acquired absence of lung [part of]: Secondary | ICD-10-CM

## 2022-07-11 NOTE — Progress Notes (Signed)
[address]       Samuel Ferrell,[place] [address]             [phone]     HPI: Samuel Ferrell returns for a scheduled follow-up visit  Samuel Ferrell is a 76 year old man with history of tobacco abuse, COPD, hypertension, hyperlipidemia, atrial fibrillation, ablation, atypical atrial flutter, TIA, anemia, reflux, melanoma, and ethanol abuse.  He was found to have a lung nodule on a low-dose screening CT.  PET/CT showed the nodule was hypermetabolic.  Navigational bronchoscopy was nondiagnostic.  He underwent a combined navigational bronchoscopy for marking followed by a left upper lobectomy on 05/19/2022.  The nodule was a stage Ia adenocarcinoma (T1, N0).  Postoperative course was notable for hoarseness.  I saw him in the office on 06/06/2022.  He was having a lot of issues with intercostal neuralgia.  We started him on the Lyrica.  His pain has improved significantly.  He is not having to take the oxycodone.  He does still have some paresthesias along the left costal margin.  Hoarseness has improved but not resolved.  Voice is usually better in the morning and then gets worse as the day goes along.  Continues to smoke, but says less than a pack a day.  Past Medical History:  Diagnosis Date   Afib [facility])    s/p ablation x2 by Samuel Ferrell at [facility]   Alcohol abuse    Anemia    Atypical atrial flutter (HCC)    COPD (chronic obstructive pulmonary disease) (Samuel Ferrell)    COVID    mild case   CVA (cerebral infarction)    GERD (gastroesophageal reflux disease)    Heart murmur    Hyperlipidemia    Hypertension    Palpitations    Pneumonia    1970's   Skin cancer--melanoma    Stroke (HCC)    TIA    Current Outpatient Medications  Medication Sig Dispense Refill   apixaban (ELIQUIS) 5 MG TABS tablet Take 5 mg by mouth 2 (two) times daily.     aspirin EC 81 MG tablet Take 81 mg by mouth daily. Swallow whole.     atorvastatin (LIPITOR) 40 MG tablet Take 40 mg by mouth  every evening.      dofetilide (TIKOSYN) 500 MCG capsule Take 1 capsule (500 mcg total) by mouth 2 (two) times daily. 180 capsule 2   enalapril (VASOTEC) 10 MG tablet Take 1 tablet (10 mg total) by mouth at bedtime. 90 tablet 3   ferrous sulfate 325 (65 FE) MG tablet Take 325 mg by mouth daily with breakfast.     finasteride (PROSCAR) 5 MG tablet Take 5 mg by mouth daily.     guaiFENesin (MUCINEX) 600 MG 12 hr tablet Take 2 tablets (1,200 mg total) by mouth 2 (two) times daily as needed for to loosen phlegm or cough.     Magnesium 250 MG TABS Take 250 mg by mouth every evening.     metoprolol tartrate (LOPRESSOR) 50 MG tablet Take 1.5 tablets (75 mg total) by mouth 2 (two) times daily. 270 tablet 3   Multiple Vitamin (MULTIVITAMIN) tablet Take 1 tablet by mouth daily.     omeprazole (PRILOSEC) 20 MG capsule Take 20 mg by mouth daily.     oxyCODONE (OXY IR/ROXICODONE) 5 MG immediate release tablet Take 1 tablet (5 mg total) by mouth every 6 (six) hours as needed for moderate pain. 28 tablet 0  potassium chloride SA (KLOR-CON M20) 20 MEQ tablet Take 1 tablet (20 mEq total) by mouth daily. 90 tablet 2   pregabalin (LYRICA) 50 MG capsule Take 1 capsule (50 mg total) by mouth 2 (two) times daily. 60 capsule 3   Tiotropium Bromide-Olodaterol (STIOLTO RESPIMAT) 2.5-2.5 MCG/ACT AERS Inhale 2 puffs into the lungs daily. 4 g 11   vitamin C (ASCORBIC ACID) 250 MG tablet Take 250 mg by mouth daily.     No current facility-administered medications for this visit.    Physical Exam BP 124/64 (BP Location: Left Arm, Patient Position: Sitting, Cuff Size: Normal)   Pulse 76   Resp 20   Ht 6\' 1"  (1.854 m)   Wt 201 lb (91.2 kg)   SpO2 95% Comment: RA  BMI 26.37 kg/m  76 year old man in no acute distress Alert and oriented x3 Hoarse voice, but stronger than at his last visit Lungs diminished at left base, otherwise clear Cardiac regular rate and rhythm No peripheral edema  Diagnostic Tests: CHEST - 2  VIEW   COMPARISON:  Prior chest radiographs 06/06/2022 and earlier. Chest CT 04/12/2022.   FINDINGS: Significant portions of the cardiac silhouette are obscured, limiting evaluation of heart size. Aortic atherosclerosis. Redemonstrated postoperative changes to the left lung, presumed prior left upper lobectomy. Unchanged elevation of the left hemidiaphragm. Hazy opacity throughout the left lung on the PA radiograph. In correlating with the lateral view radiograph, this appears secondary to the presence of a moderate-sized left pleural effusion and left-sided volume loss. No appreciable airspace consolidation on the right. Pleural calcifications. No evidence of pneumothorax. Degenerative changes of the spine.   IMPRESSION: No significant change from the prior chest radiograph of 06/06/2022.   Redemonstrated postoperative changes from presumed left upper lobectomy with left-sided volume loss and moderate-sized left pleural effusion.   Aortic Atherosclerosis (ICD10-I70.0).     Electronically Signed   By: Samuel Ferrell D.O.   On: 07/11/2022 09:54 I personally reviewed the chest x-ray images.  Typical findings for post lobectomy.  Impression: Samuel Ferrell is a 76 year old man with history of tobacco abuse, COPD, hypertension, hyperlipidemia, atrial fibrillation, ablation, atypical atrial flutter, TIA, anemia, reflux, melanoma, and ethanol abuse.    Stage Ia adenocarcinoma left upper lobe-status post left upper lobectomy.  No indication for adjuvant therapy.  He saw Samuel. Julien Ferrell.  He is going to do the remainder of his follow-up through the [place].  Postoperative pain-significantly improved.  Would continue Lyrica for now.  Hoarseness-some improvement with voice still gets very weak during the course of the day.  Hopefully will continue to improve with time.  We will reassess when we see him back and make referral to ENT if persistent.  Plan: Return in 3 months with PA and lateral chest  x[facility]ray [facility]   Samuel Nakayama, MD [facility] [phone]

## 2022-10-02 ENCOUNTER — Other Ambulatory Visit: Payer: Self-pay | Admitting: Thoracic Surgery (Cardiothoracic Vascular Surgery)

## 2022-10-02 DIAGNOSIS — C3490 Malignant neoplasm of unspecified part of unspecified bronchus or lung: Secondary | ICD-10-CM

## 2022-10-03 ENCOUNTER — Ambulatory Visit
Admission: RE | Admit: 2022-10-03 | Discharge: 2022-10-03 | Disposition: A | Discharge status: Home / Self Care | Payer: No Typology Code available for payment source | Source: Ambulatory Visit | Attending: Thoracic Surgery (Cardiothoracic Vascular Surgery) | Admitting: Thoracic Surgery (Cardiothoracic Vascular Surgery)

## 2022-10-03 ENCOUNTER — Ambulatory Visit (INDEPENDENT_AMBULATORY_CARE_PROVIDER_SITE_OTHER): Payer: No Typology Code available for payment source | Admitting: Thoracic Surgery (Cardiothoracic Vascular Surgery)

## 2022-10-03 VITALS — BP 134/70 | HR 71 | Resp 18 | Ht 73.0 in | Wt 204.0 lb

## 2022-10-03 DIAGNOSIS — Z902 Acquired absence of lung [part of]: Secondary | ICD-10-CM

## 2022-10-03 DIAGNOSIS — C3490 Malignant neoplasm of unspecified part of unspecified bronchus or lung: Secondary | ICD-10-CM

## 2022-10-03 NOTE — Progress Notes (Signed)
[address]       [name] [address]             [phone]    HPI: [name] returns for a scheduled follow-up visit  Samuel Ferrell is a 77 year old man with a history of tobacco abuse, COPD, hypertension, hyperlipidemia, atrial fibrillation, atypical atrial flutter, TIA, anemia, reflux, melanoma, ethanol abuse, and a stage Ia adenocarcinoma of the lung.  He underwent left upper lobectomy and node dissection on 05/19/2022.  Postoperative course was notable for hoarseness.  Also had some intercostal neuralgia pain.  That improved dramatically with Lyrica.  Last saw him in the office on 07/11/2022.  He was doing well at that time but continued to have hoarseness.  He saw an ENT at the [facility] which showed dysfunction of the vocal cord.  Plan is for observation for another 3 months.   Past Medical History:  Diagnosis Date   Afib [facility])    s/p ablation x2 by Dr Sampson Goon at [facility] in 2007, 2009   Alcohol abuse    Anemia    Atypical atrial flutter (HCC)    COPD (chronic obstructive pulmonary disease) (HCC)    COVID    mild case   CVA (cerebral infarction)    GERD (gastroesophageal reflux disease)    Heart murmur    Hyperlipidemia    Hypertension    Palpitations    Pneumonia    1970's   Skin cancer--melanoma    Stroke (HCC)    TIA    Current Outpatient Medications  Medication Sig Dispense Refill   apixaban (ELIQUIS) 5 MG TABS tablet Take 5 mg by mouth 2 (two) times daily.     aspirin EC 81 MG tablet Take 81 mg by mouth daily. Swallow whole.     atorvastatin (LIPITOR) 40 MG tablet Take 40 mg by mouth every evening.      dofetilide (TIKOSYN) 500 MCG capsule Take 1 capsule (500 mcg total) by mouth 2 (two) times daily. 180 capsule 2   enalapril (VASOTEC) 10 MG tablet Take 1 tablet (10 mg total) by mouth at bedtime. 90 tablet 3   ferrous sulfate 325 (65 FE) MG tablet Take 325 mg by mouth daily with breakfast.     finasteride (PROSCAR) 5 MG tablet Take 5 mg by mouth  daily.     guaiFENesin (MUCINEX) 600 MG 12 hr tablet Take 2 tablets (1,200 mg total) by mouth 2 (two) times daily as needed for to loosen phlegm or cough.     Magnesium 250 MG TABS Take 250 mg by mouth every evening.     metoprolol tartrate (LOPRESSOR) 50 MG tablet Take 1.5 tablets (75 mg total) by mouth 2 (two) times daily. 270 tablet 3   Multiple Vitamin (MULTIVITAMIN) tablet Take 1 tablet by mouth daily.     omeprazole (PRILOSEC) 20 MG capsule Take 20 mg by mouth daily.     oxyCODONE (OXY IR/ROXICODONE) 5 MG immediate release tablet Take 1 tablet (5 mg total) by mouth every 6 (six) hours as needed for moderate pain. 28 tablet 0   potassium chloride SA (KLOR-CON M20) 20 MEQ tablet Take 1 tablet (20 mEq total) by mouth daily. 90 tablet 2   pregabalin (LYRICA) 50 MG capsule Take 1 capsule (50 mg total) by mouth 2 (two) times daily. 60 capsule 3   Tiotropium Bromide-Olodaterol (STIOLTO RESPIMAT) 2.5-2.5 MCG/ACT AERS Inhale 2 puffs into the lungs daily. 4 g 11   vitamin C (ASCORBIC  ACID) 250 MG tablet Take 250 mg by mouth daily.     No current facility-administered medications for this visit.    Physical Exam BP 134/70 (BP Location: Right Arm, Patient Position: Sitting)   Pulse 71   Resp 18   Ht 6\' 1"  (1.854 m)   Wt 204 lb (92.5 kg)   SpO2 95% Comment: RA  BMI 26.76 kg/m  77 year old man in no acute distress Well-developed well-nourished Alert and oriented x 3, high-pitched hoarse voice Lungs diminished on left but otherwise clear  Diagnostic Tests: I personally reviewed his chest x-ray images.  Shows postoperative changes.  Impression: Samuel Ferrell is a 77 year old man with a history of tobacco abuse, COPD, hypertension, hyperlipidemia, atrial fibrillation, atypical atrial flutter, TIA, anemia, reflux, melanoma, ethanol abuse, and a stage Ia adenocarcinoma of the lung.  Stage Ia adenocarcinoma of the lung-status post left upper lobectomy.  No adjuvant therapy indicated.  He saw Dr.  66 but plans to do his follow-up through the [facility].  Hoarseness-recurrent nerve dysfunction related to node dissection in the AP window.  Tolerating well.  Varies from day-to-day.  Has seen ENT and has scheduled follow-up.  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  Plan: Follow-up with oncology and ENT at [facility] Return in 6 months with PA lateral chest x-ray to check on progress  [facility], MD [facility] [phone]

## 2022-10-19 ENCOUNTER — Encounter (HOSPITAL_COMMUNITY): Payer: Self-pay | Admitting: *Deleted

## 2022-11-21 ENCOUNTER — Encounter: Payer: Self-pay | Admitting: Vascular Surgery

## 2022-11-21 ENCOUNTER — Ambulatory Visit (HOSPITAL_COMMUNITY)
Admission: RE | Admit: 2022-11-21 | Discharge: 2022-11-21 | Disposition: A | Discharge status: Home / Self Care | Payer: No Typology Code available for payment source | Source: Ambulatory Visit | Attending: Vascular Surgery | Admitting: Vascular Surgery

## 2022-11-21 ENCOUNTER — Ambulatory Visit (INDEPENDENT_AMBULATORY_CARE_PROVIDER_SITE_OTHER): Payer: No Typology Code available for payment source | Admitting: Vascular Surgery

## 2022-11-21 VITALS — BP 132/72 | HR 60 | Temp 97.7°F | Resp 18 | Ht 73.0 in | Wt 206.0 lb

## 2022-11-21 DIAGNOSIS — I722 Aneurysm of renal artery: Secondary | ICD-10-CM | POA: Diagnosis not present

## 2022-11-21 DIAGNOSIS — I701 Atherosclerosis of renal artery: Secondary | ICD-10-CM

## 2022-11-21 NOTE — Progress Notes (Signed)
Patient name: Samuel Ferrell MRN: [id number] DOB: October 30, 1945 Sex: male  REASON FOR VISIT: 6 month F/U after bilateral renal artery stenting  HPI: Samuel Ferrell is a 77 y.o. male with hx lung cancer, tobacco abuse, afib, COPD, HTN, HLD, renal artery stenosis that presents for 6 month follow-up after previous bilateral renal artery stenting on 01/05/2022.  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  He also had an 80% stenosis of the right renal artery at the origin.  He had a 6 x 18 Herculink placed in the left renal artery and a 6 x 15 Herculink placed in the right renal artery.  He remains on aspirin and eliquis.   His blood pressures have been pretty good in the 140s on ACE inhibitor and metoprolol.  Since his last evaluation he did undergo a left upper lobe lobectomy with node dissection on 05/19/2022 with Dr. Roxan Hockey.  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    Past Medical History:  Diagnosis Date   Afib [facility])    s/p ablation x2 by Dr [name] at [facility] in 2007, 2009   Alcohol abuse    Anemia    Atypical atrial flutter ([place])    COPD (chronic obstructive pulmonary disease) ([place])    COVID    mild case   CVA (cerebral infarction)    GERD (gastroesophageal reflux disease)    Heart murmur    Hyperlipidemia    Hypertension    Palpitations    Pneumonia    1970's   Skin cancer--melanoma    Stroke [facility])    TIA    Past Surgical History:  Procedure Laterality Date   ANTERIOR CERVICAL DECOMP/DISCECTOMY FUSION N/A 07/24/2018   Procedure: ANTERIOR CERVICAL DECOMPRESSION/DISCECTOMY FUSION, INTERBODY PROSTHESIS, PLATE SCREWS CERVICAL FIVE- CERVICAL SIX;  Surgeon: Newman Pies, MD;  Location: [place];  Service: Neurosurgery;  Laterality: N/A;  ANTERIOR CERVICAL DECOMPRESSION/DISCECTOMY FUSION, INTERBODY PROSTHESIS, PLATE SCREWS CERVICAL FIVE- CERVICAL SIX   APPENDECTOMY     ATRIAL FIBRILLATION ABLATION   2007 ,2009   Dr [name] at [place]  04/24/2022   Procedure: BRONCHIAL BIOPSIES;  Surgeon: Maryjane Hurter, MD;  Location: [place];  Service: Pulmonary;;   BRONCHIAL BRUSHINGS  04/24/2022   Procedure: BRONCHIAL BRUSHINGS;  Surgeon: Maryjane Hurter, MD;  Location: [place];  Service: Pulmonary;;   BRONCHIAL BRUSHINGS  05/19/2022   Procedure: BRONCHIAL BRUSHINGS;  Surgeon: Maryjane Hurter, MD;  Location: [place];  Service: Pulmonary;;   BRONCHIAL NEEDLE ASPIRATION BIOPSY  04/24/2022   Procedure: BRONCHIAL NEEDLE ASPIRATION BIOPSIES;  Surgeon: Maryjane Hurter, MD;  Location: [place];  Service: Pulmonary;;   BRONCHIAL NEEDLE ASPIRATION BIOPSY  05/19/2022   Procedure: BRONCHIAL NEEDLE ASPIRATION BIOPSIES;  Surgeon: Maryjane Hurter, MD;  Location: [place];  Service: Pulmonary;;   BRONCHIAL WASHINGS  04/24/2022   Procedure: BRONCHIAL WASHINGS;  Surgeon: Maryjane Hurter, MD;  Location: [place];  Service: Pulmonary;;   EYE SURGERY Bilateral    cataracts   FIDUCIAL MARKER PLACEMENT  05/19/2022   Procedure: DYE MARKING;  Surgeon: Maryjane Hurter, MD;  Location: [place];  Service: Pulmonary;;   FRACTURE SURGERY     right clavicle   HEMOSTASIS CONTROL  04/24/2022   Procedure: HEMOSTASIS CONTROL;  Surgeon: Maryjane Hurter, MD;  Location: [facility] ENDOSCOPY;  Service: Pulmonary;;   INTERCOSTAL NERVE BLOCK Left 05/19/2022   Procedure: Dossie Der  NERVE BLOCK;  Surgeon: Melrose Nakayama, MD;  Location: [place];  Service: Thoracic;  Laterality: Left;   LYMPH NODE DISSECTION Left 05/19/2022   Procedure: LYMPH NODE DISSECTION;  Surgeon: Melrose Nakayama, MD;  Location: [place];  Service: Thoracic;  Laterality: Left;   PERIPHERAL VASCULAR INTERVENTION Bilateral 01/05/2022   Procedure: PERIPHERAL VASCULAR INTERVENTION;  Surgeon: Marty Heck, MD;  Location: [place] CV LAB;  Service: Cardiovascular;  Laterality: Bilateral;   Bilateral Renal Artery Stents   RENAL ANGIOGRAPHY Bilateral 01/05/2022   Procedure: RENAL ARTERY STENTING;  Surgeon: Marty Heck, MD;  Location: [place] CV LAB;  Service: Cardiovascular;  Laterality: Bilateral;   TONSILLECTOMY     VIDEO BRONCHOSCOPY WITH RADIAL ENDOBRONCHIAL ULTRASOUND  04/24/2022   Procedure: VIDEO BRONCHOSCOPY WITH RADIAL ENDOBRONCHIAL ULTRASOUND;  Surgeon: Maryjane Hurter, MD;  Location: [facility] ENDOSCOPY;  Service: Pulmonary;;   VIDEO BRONCHOSCOPY WITH RADIAL ENDOBRONCHIAL ULTRASOUND  05/19/2022   Procedure: VIDEO BRONCHOSCOPY WITH RADIAL ENDOBRONCHIAL ULTRASOUND;  Surgeon: Maryjane Hurter, MD;  Location: [facility] ENDOSCOPY;  Service: Pulmonary;;    Family History  Problem Relation Age of Onset   Lung cancer Brother        smoked   Cancer Other    Colon cancer Neg Hx    Esophageal cancer Neg Hx    Rectal cancer Neg Hx    Stomach cancer Neg Hx     SOCIAL HISTORY: Social History   Tobacco Use   Smoking status: Every Day    Packs/day: 1.00    Years: 61.00    Total pack years: 61.00    Types: Cigarettes   Smokeless tobacco: Never  Substance Use Topics   Alcohol use: Not Currently    Comment: heavy, up to 6 beers per day    No Known Allergies  Current Outpatient Medications  Medication Sig Dispense Refill   apixaban (ELIQUIS) 5 MG TABS tablet Take 5 mg by mouth 2 (two) times daily.     aspirin EC 81 MG tablet Take 81 mg by mouth daily. Swallow whole.     atorvastatin (LIPITOR) 40 MG tablet Take 40 mg by mouth every evening.      dofetilide (TIKOSYN) 500 MCG capsule Take 1 capsule (500 mcg total) by mouth 2 (two) times daily. 180 capsule 2   enalapril (VASOTEC) 10 MG tablet Take 1 tablet (10 mg total) by mouth at bedtime. 90 tablet 3   ferrous sulfate 325 (65 FE) MG tablet Take 325 mg by mouth daily with breakfast.     finasteride (PROSCAR) 5 MG tablet Take 5 mg by mouth daily.     guaiFENesin (MUCINEX) 600 MG 12 hr tablet Take 2 tablets (1,200 mg  total) by mouth 2 (two) times daily as needed for to loosen phlegm or cough.     Magnesium 250 MG TABS Take 250 mg by mouth every evening.     metoprolol tartrate (LOPRESSOR) 50 MG tablet Take 1.5 tablets (75 mg total) by mouth 2 (two) times daily. 270 tablet 3   Multiple Vitamin (MULTIVITAMIN) tablet Take 1 tablet by mouth daily.     omeprazole (PRILOSEC) 20 MG capsule Take 20 mg by mouth daily.     oxyCODONE (OXY IR/ROXICODONE) 5 MG immediate release tablet Take 1 tablet (5 mg total) by mouth every 6 (six) hours as needed for moderate pain. 28 tablet 0   potassium chloride SA (KLOR-CON M20) 20 MEQ tablet Take 1 tablet (20 mEq total) by mouth daily. 90 tablet 2  pregabalin (LYRICA) 50 MG capsule Take 1 capsule (50 mg total) by mouth 2 (two) times daily. 60 capsule 3   Tiotropium Bromide-Olodaterol (STIOLTO RESPIMAT) 2.5-2.5 MCG/ACT AERS Inhale 2 puffs into the lungs daily. 4 g 11   vitamin C (ASCORBIC ACID) 250 MG tablet Take 250 mg by mouth daily.     No current facility-administered medications for this visit.    REVIEW OF SYSTEMS:  '[X]'$  denotes positive finding, '[ ]'$  denotes negative finding Cardiac  Comments:  Chest pain or chest pressure:    Shortness of breath upon exertion:    Short of breath when lying flat:    Irregular heart rhythm:        Vascular    Pain in calf, thigh, or hip brought on by ambulation:    Pain in feet at night that wakes you up from your sleep:     Blood clot in your veins:    Leg swelling:         Pulmonary    Oxygen at home:    Productive cough:     Wheezing:         Neurologic    Sudden weakness in arms or legs:     Sudden numbness in arms or legs:     Sudden onset of difficulty speaking or slurred speech:    Temporary loss of vision in one eye:     Problems with dizziness:         Gastrointestinal    Blood in stool:     Vomited blood:         Genitourinary    Burning when urinating:     Blood in urine:        Psychiatric    Major  depression:         Hematologic    Bleeding problems:    Problems with blood clotting too easily:        Skin    Rashes or ulcers:        Constitutional    Fever or chills:      PHYSICAL EXAM: Vitals:   11/21/22 0818  BP: 132/72  Pulse: 60  Resp: 18  Temp: 97.7 F (36.5 C)  TempSrc: Temporal  SpO2: 96%  Weight: 206 lb (93.4 kg)  Height: '6\' 1"'$  (1.854 m)    GENERAL: The patient is a well-nourished male, in no acute distress. The vital signs are documented above. CARDIAC: There is a regular rate and rhythm.  VASCULAR:  Bilateral femoral pulses palpable PULMONARY: No respiratory distress. ABDOMEN: Soft and non-tender. MUSCULOSKELETAL: There are no major deformities or cyanosis. NEUROLOGIC: No focal weakness or paresthesias are detected. SKIN: There are no ulcers or rashes noted. PSYCHIATRIC: The patient has a normal affect.  DATA:   Renal artery duplex today shows no evidence of recurrent flow-limiting stenosis in either renal artery.  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  Discussed his renal artery duplex today looks good with no evidence of recurrent flow-limiting stenosis.  Discussed aspirin statin for stent patency and he is also on Eliquis for history of atrial fibrillation.  Discussed next renal artery duplex will be in 1 year for ongoing surveillance.  Also, he had evidence of 2 small right renal artery aneurysms on his CT from  11/2021 that was ordered by Dr. Donnetta Hutching.  I discussed getting a repeat CT scan to ensure that his renal artery aneurysms remain unchanged.  Will need to get this approved through the [place].  I will have him follow-up with me once the CT is done.   Marty Heck, MD [facility] Office:  [phone]

## 2022-11-30 ENCOUNTER — Other Ambulatory Visit: Payer: Self-pay

## 2022-11-30 DIAGNOSIS — I701 Atherosclerosis of renal artery: Secondary | ICD-10-CM

## 2022-12-26 ENCOUNTER — Telehealth: Payer: Self-pay | Admitting: Medical Oncology

## 2022-12-26 ENCOUNTER — Inpatient Hospital Stay: Payer: No Typology Code available for payment source

## 2022-12-26 ENCOUNTER — Inpatient Hospital Stay: Payer: No Typology Code available for payment source | Admitting: Internal Medicine

## 2022-12-26 NOTE — Telephone Encounter (Signed)
"   I am getting all my treatment and visits at the [place].Please cancel all my appts at the cancer Center." done

## 2023-01-02 ENCOUNTER — Ambulatory Visit (HOSPITAL_COMMUNITY)
Admission: RE | Admit: 2023-01-02 | Discharge: 2023-01-02 | Disposition: A | Discharge status: Home / Self Care | Payer: No Typology Code available for payment source | Source: Ambulatory Visit | Attending: Vascular Surgery | Admitting: Vascular Surgery

## 2023-01-02 DIAGNOSIS — I701 Atherosclerosis of renal artery: Secondary | ICD-10-CM | POA: Diagnosis present

## 2023-01-02 MED ORDER — IOHEXOL 350 MG/ML SOLN
100.0000 mL | Freq: Once | INTRAVENOUS | Status: AC | PRN
  Administered 2023-01-02: 100 mL via INTRAVENOUS

## 2023-01-09 ENCOUNTER — Ambulatory Visit (INDEPENDENT_AMBULATORY_CARE_PROVIDER_SITE_OTHER): Payer: No Typology Code available for payment source | Admitting: Vascular Surgery

## 2023-01-09 ENCOUNTER — Encounter: Payer: Self-pay | Admitting: Vascular Surgery

## 2023-01-09 VITALS — BP 119/66 | HR 83 | Temp 98.2°F | Resp 16 | Ht 73.5 in | Wt 208.0 lb

## 2023-01-09 DIAGNOSIS — I701 Atherosclerosis of renal artery: Secondary | ICD-10-CM

## 2023-01-09 DIAGNOSIS — I722 Aneurysm of renal artery: Secondary | ICD-10-CM | POA: Diagnosis not present

## 2023-01-09 NOTE — Progress Notes (Signed)
Patient name: Samuel Ferrell MRN: [id number] DOB: 1946-01-05 Sex: male  REASON FOR VISIT: F/U after CT for surveillance renal artery anerurysms  HPI: Samuel Ferrell is a 77 y.o. male with hx lung cancer, tobacco abuse, afib, COPD, HTN, HLD, renal artery stenosis that presents for follow-up after CT.  He previously had bilateral renal artery stenting on 01/05/2022.  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  He also had an 80% stenosis of the right renal artery at the origin.  He had a 6 x 18 Herculink placed in the left renal artery and a 6 x 15 Herculink placed in the right renal artery.  He remains on aspirin and eliquis.    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   Past Medical History:  Diagnosis Date   [facility])    s/p ablation x2 by Dr Sampson Goon at [facility] in 2007, 2009   Alcohol abuse    Anemia    Atypical atrial flutter (HCC)    COPD (chronic obstructive pulmonary disease) (HCC)    COVID    mild case   CVA (cerebral infarction)    GERD (gastroesophageal reflux disease)    Heart murmur    Hyperlipidemia    Hypertension    Palpitations    Pneumonia    1970's   Skin cancer--melanoma    Stroke [facility])    TIA    Past Surgical History:  Procedure Laterality Date   ANTERIOR CERVICAL DECOMP/DISCECTOMY FUSION N/A 07/24/2018   Procedure: ANTERIOR CERVICAL DECOMPRESSION/DISCECTOMY FUSION, INTERBODY PROSTHESIS, PLATE SCREWS CERVICAL FIVE- CERVICAL SIX;  Surgeon: Tressie Stalker, MD;  Location: [facility] OR;  Service: Neurosurgery;  Laterality: N/A;  ANTERIOR CERVICAL DECOMPRESSION/DISCECTOMY FUSION, INTERBODY PROSTHESIS, PLATE SCREWS CERVICAL FIVE- CERVICAL SIX   APPENDECTOMY     ATRIAL FIBRILLATION ABLATION  2007 ,2009   Dr Sampson Goon at [facility]   BRONCHIAL BIOPSY  04/24/2022   Procedure: BRONCHIAL BIOPSIES;  Surgeon: Omar Person, MD;  Location: [facility]  ENDOSCOPY;  Service: Pulmonary;;   BRONCHIAL BRUSHINGS  04/24/2022   Procedure: BRONCHIAL BRUSHINGS;  Surgeon: Omar Person, MD;  Location: [facility] ENDOSCOPY;  Service: Pulmonary;;   BRONCHIAL BRUSHINGS  05/19/2022   Procedure: BRONCHIAL BRUSHINGS;  Surgeon: Omar Person, MD;  Location: [facility] ENDOSCOPY;  Service: Pulmonary;;   BRONCHIAL NEEDLE ASPIRATION BIOPSY  04/24/2022   Procedure: BRONCHIAL NEEDLE ASPIRATION BIOPSIES;  Surgeon: Omar Person, MD;  Location: [facility] ENDOSCOPY;  Service: Pulmonary;;   BRONCHIAL NEEDLE ASPIRATION BIOPSY  05/19/2022   Procedure: BRONCHIAL NEEDLE ASPIRATION BIOPSIES;  Surgeon: Omar Person, MD;  Location: [facility] ENDOSCOPY;  Service: Pulmonary;;   BRONCHIAL WASHINGS  04/24/2022   Procedure: BRONCHIAL WASHINGS;  Surgeon: Omar Person, MD;  Location: [facility] ENDOSCOPY;  Service: Pulmonary;;   EYE SURGERY Bilateral    cataracts   FIDUCIAL MARKER PLACEMENT  05/19/2022   Procedure: DYE MARKING;  Surgeon: Omar Person, MD;  Location: [facility] ENDOSCOPY;  Service: Pulmonary;;   FRACTURE SURGERY     right clavicle   HEMOSTASIS CONTROL  04/24/2022   Procedure: HEMOSTASIS CONTROL;  Surgeon: Omar Person, MD;  Location: [facility] ENDOSCOPY;  Service: Pulmonary;;   INTERCOSTAL NERVE BLOCK Left 05/19/2022   Procedure: INTERCOSTAL NERVE BLOCK;  Surgeon: Loreli Slot, MD;  Location: [facility] OR;  Service: Thoracic;  Laterality: Left;   LYMPH NODE DISSECTION Left 05/19/2022  Procedure: LYMPH NODE DISSECTION;  Surgeon: Loreli Slot, MD;  Location: [facility] OR;  Service: Thoracic;  Laterality: Left;   PERIPHERAL VASCULAR INTERVENTION Bilateral 01/05/2022   Procedure: PERIPHERAL VASCULAR INTERVENTION;  Surgeon: Cephus Shelling, MD;  Location: [facility] INVASIVE CV LAB;  Service: Cardiovascular;  Laterality: Bilateral;  Bilateral Renal Artery Stents   RENAL ANGIOGRAPHY Bilateral 01/05/2022   Procedure: RENAL ARTERY STENTING;  Surgeon: Cephus Shelling, MD;  Location: [facility]  INVASIVE CV LAB;  Service: Cardiovascular;  Laterality: Bilateral;   TONSILLECTOMY     VIDEO BRONCHOSCOPY WITH RADIAL ENDOBRONCHIAL ULTRASOUND  04/24/2022   Procedure: VIDEO BRONCHOSCOPY WITH RADIAL ENDOBRONCHIAL ULTRASOUND;  Surgeon: Omar Person, MD;  Location: [facility] ENDOSCOPY;  Service: Pulmonary;;   VIDEO BRONCHOSCOPY WITH RADIAL ENDOBRONCHIAL ULTRASOUND  05/19/2022   Procedure: VIDEO BRONCHOSCOPY WITH RADIAL ENDOBRONCHIAL ULTRASOUND;  Surgeon: Omar Person, MD;  Location: [facility] ENDOSCOPY;  Service: Pulmonary;;    Family History  Problem Relation Age of Onset   Lung cancer Brother        smoked   Cancer Other    Colon cancer Neg Hx    Esophageal cancer Neg Hx    Rectal cancer Neg Hx    Stomach cancer Neg Hx     SOCIAL HISTORY: Social History   Tobacco Use   Smoking status: Every Day    Packs/day: 1.00    Years: 61.00    Additional pack years: 0.00    Total pack years: 61.00    Types: Cigarettes   Smokeless tobacco: Never  Substance Use Topics   Alcohol use: Not Currently    Comment: heavy, up to 6 beers per day    No Known Allergies  Current Outpatient Medications  Medication Sig Dispense Refill   apixaban (ELIQUIS) 5 MG TABS tablet Take 5 mg by mouth 2 (two) times daily.     aspirin EC 81 MG tablet Take 81 mg by mouth daily. Swallow whole.     atorvastatin (LIPITOR) 40 MG tablet Take 40 mg by mouth every evening.      dofetilide (TIKOSYN) 500 MCG capsule Take 1 capsule (500 mcg total) by mouth 2 (two) times daily. 180 capsule 2   enalapril (VASOTEC) 10 MG tablet Take 1 tablet (10 mg total) by mouth at bedtime. 90 tablet 3   ferrous sulfate 325 (65 FE) MG tablet Take 325 mg by mouth daily with breakfast.     finasteride (PROSCAR) 5 MG tablet Take 5 mg by mouth daily.     Magnesium 250 MG TABS Take 250 mg by mouth every evening.     metoprolol tartrate (LOPRESSOR) 50 MG tablet Take 1.5 tablets (75 mg total) by mouth 2 (two) times daily. 270 tablet 3   Multiple  Vitamin (MULTIVITAMIN) tablet Take 1 tablet by mouth daily.     omeprazole (PRILOSEC) 20 MG capsule Take 20 mg by mouth daily.     potassium chloride SA (KLOR-CON M20) 20 MEQ tablet Take 1 tablet (20 mEq total) by mouth daily. 90 tablet 2   Tiotropium Bromide-Olodaterol (STIOLTO RESPIMAT) 2.5-2.5 MCG/ACT AERS Inhale 2 puffs into the lungs daily. 4 g 11   vitamin C (ASCORBIC ACID) 250 MG tablet Take 250 mg by mouth daily.     guaiFENesin (MUCINEX) 600 MG 12 hr tablet Take 2 tablets (1,200 mg total) by mouth 2 (two) times daily as needed for to loosen phlegm or cough. (Patient not taking: Reported on 01/09/2023)     oxyCODONE (OXY IR/ROXICODONE) 5 MG immediate  release tablet Take 1 tablet (5 mg total) by mouth every 6 (six) hours as needed for moderate pain. (Patient not taking: Reported on 01/09/2023) 28 tablet 0   pregabalin (LYRICA) 50 MG capsule Take 1 capsule (50 mg total) by mouth 2 (two) times daily. (Patient not taking: Reported on 01/09/2023) 60 capsule 3   No current facility-administered medications for this visit.    REVIEW OF SYSTEMS:  [X]  denotes positive finding, [ ]  denotes negative finding Cardiac  Comments:  Chest pain or chest pressure:    Shortness of breath upon exertion:    Short of breath when lying flat:    Irregular heart rhythm:        Vascular    Pain in calf, thigh, or hip brought on by ambulation:    Pain in feet at night that wakes you up from your sleep:     Blood clot in your veins:    Leg swelling:         Pulmonary    Oxygen at home:    Productive cough:     Wheezing:         Neurologic    Sudden weakness in arms or legs:     Sudden numbness in arms or legs:     Sudden onset of difficulty speaking or slurred speech:    Temporary loss of vision in one eye:     Problems with dizziness:         Gastrointestinal    Blood in stool:     Vomited blood:         Genitourinary    Burning when urinating:     Blood in urine:        Psychiatric    Major  depression:         Hematologic    Bleeding problems:    Problems with blood clotting too easily:        Skin    Rashes or ulcers:        Constitutional    Fever or chills:      PHYSICAL EXAM: Vitals:   01/09/23 1255  BP: 119/66  Pulse: 83  Resp: 16  Temp: 98.2 F (36.8 C)  TempSrc: Temporal  SpO2: 94%  Weight: 208 lb (94.3 kg)  Height: 6' 1.5" (1.867 m)    GENERAL: The patient is a well-nourished male, in no acute distress. The vital signs are documented above. CARDIAC: There is a regular rate and rhythm.  VASCULAR:  Bilateral femoral pulses palpable PULMONARY: No respiratory distress. ABDOMEN: Soft and non-tender. MUSCULOSKELETAL: There are no major deformities or cyanosis. NEUROLOGIC: No focal weakness or paresthesias are detected. SKIN: There are no ulcers or rashes noted. PSYCHIATRIC: The patient has a normal affect.  DATA:   CTA reviewed from 01/03/23 and right renal artery aneurysms measure 0.7 cm and 1.2 cm with minimal change.  Right renal stent widely patent.  Left renal stent with in-stent stenosis and apparent thrombus.  Renal artery duplex previously showed no evidence of recurrent flow-limiting stenosis in either renal artery.  Assessment/Plan:  77 yo M that presents for follow-up for surveillance of his 2 small right renal artery aneurysms.  I repeated a CT scan and discussed there has been no significant interval change since his CT last year in March 2023 with his aneurysms measuring 1.2 cm and 0.7 cm.  Discussed typical repair of renal artery aneurysms is >3 cm.    The CTA did suggest in-stent stenosis of his  left renal stent.  The stent was previously difficult to visualize on duplex although no focal stenosis was identified.  I reviewed the CT imaging that does show what looks like a significant in-stent stenosis.  I have recommended aortogram with renal artery arteriogram a possible left renal artery intervention.  Discussed this being done in the  Cath Lab through a transfemoral approach.  Risk and benefits discussed.  Again stressed the importance of smoking cessation.        Cephus Shelling, MD [facility] Office: [phone]

## 2023-01-12 ENCOUNTER — Other Ambulatory Visit: Payer: Self-pay

## 2023-01-12 DIAGNOSIS — I701 Atherosclerosis of renal artery: Secondary | ICD-10-CM

## 2023-01-18 ENCOUNTER — Ambulatory Visit (HOSPITAL_COMMUNITY)
Admission: RE | Admit: 2023-01-18 | Discharge: 2023-01-18 | Disposition: A | Discharge status: Home / Self Care | Payer: No Typology Code available for payment source | Attending: Vascular Surgery | Admitting: Vascular Surgery

## 2023-01-18 ENCOUNTER — Encounter (HOSPITAL_COMMUNITY)
Admission: RE | Disposition: A | Discharge status: Home / Self Care | Payer: Self-pay | Source: Home / Self Care | Attending: Vascular Surgery

## 2023-01-18 ENCOUNTER — Other Ambulatory Visit: Payer: Self-pay

## 2023-01-18 DIAGNOSIS — Y831 Surgical operation with implant of artificial internal device as the cause of abnormal reaction of the patient, or of later complication, without mention of misadventure at the time of the procedure: Secondary | ICD-10-CM | POA: Diagnosis not present

## 2023-01-18 DIAGNOSIS — T82856A Stenosis of peripheral vascular stent, initial encounter: Secondary | ICD-10-CM | POA: Diagnosis present

## 2023-01-18 DIAGNOSIS — Z7901 Long term (current) use of anticoagulants: Secondary | ICD-10-CM | POA: Diagnosis not present

## 2023-01-18 DIAGNOSIS — I10 Essential (primary) hypertension: Secondary | ICD-10-CM | POA: Insufficient documentation

## 2023-01-18 DIAGNOSIS — I4891 Unspecified atrial fibrillation: Secondary | ICD-10-CM | POA: Insufficient documentation

## 2023-01-18 DIAGNOSIS — I701 Atherosclerosis of renal artery: Secondary | ICD-10-CM | POA: Diagnosis not present

## 2023-01-18 DIAGNOSIS — T82858A Stenosis of vascular prosthetic devices, implants and grafts, initial encounter: Secondary | ICD-10-CM | POA: Diagnosis not present

## 2023-01-18 DIAGNOSIS — F17210 Nicotine dependence, cigarettes, uncomplicated: Secondary | ICD-10-CM | POA: Insufficient documentation

## 2023-01-18 HISTORY — PX: RENAL ANGIOGRAPHY: CATH118260

## 2023-01-18 HISTORY — PX: RENAL INTERVENTION: CATH118261

## 2023-01-18 HISTORY — PX: ABDOMINAL AORTOGRAM: CATH118222

## 2023-01-18 LAB — POCT I-STAT, CHEM 8
BUN: 21 mg/dL (ref 8–23)
Calcium, Ion: 1.21 mmol/L (ref 1.15–1.40)
Chloride: 102 mmol/L (ref 98–111)
Creatinine, Ser: 1.6 mg/dL — ABNORMAL HIGH (ref 0.61–1.24)
Glucose, Bld: 86 mg/dL (ref 70–99)
HCT: 41 % (ref 39.0–52.0)
Hemoglobin: 13.9 g/dL (ref 13.0–17.0)
Potassium: 5 mmol/L (ref 3.5–5.1)
Sodium: 138 mmol/L (ref 135–145)
TCO2: 26 mmol/L (ref 22–32)

## 2023-01-18 SURGERY — RENAL ANGIOGRAPHY
Anesthesia: LOCAL

## 2023-01-18 MED ORDER — MIDAZOLAM HCL 2 MG/2ML IJ SOLN
INTRAMUSCULAR | Status: DC | PRN
  Administered 2023-01-18: 1 mg via INTRAVENOUS

## 2023-01-18 MED ORDER — ACETAMINOPHEN 325 MG PO TABS: 650.0000 mg | ORAL_TABLET | ORAL | Status: DC | PRN

## 2023-01-18 MED ORDER — SODIUM CHLORIDE 0.9 % IV SOLN: 250.0000 mL | INTRAVENOUS | Status: DC | PRN

## 2023-01-18 MED ORDER — IOHEXOL 350 MG/ML SOLN
INTRAVENOUS | Status: DC | PRN
  Administered 2023-01-18: 45 mL via INTRA_ARTERIAL

## 2023-01-18 MED ORDER — LIDOCAINE HCL (PF) 1 % IJ SOLN
INTRAMUSCULAR | Status: DC | PRN
  Administered 2023-01-18: 20 mL via INTRADERMAL

## 2023-01-18 MED ORDER — SODIUM CHLORIDE 0.9 % IV SOLN: INTRAVENOUS | Status: DC

## 2023-01-18 MED ORDER — HEPARIN (PORCINE) IN NACL 1000-0.9 UT/500ML-% IV SOLN
INTRAVENOUS | Status: DC | PRN
  Administered 2023-01-18 (×2): 500 mL

## 2023-01-18 MED ORDER — ONDANSETRON HCL 4 MG/2ML IJ SOLN: 4.0000 mg | Freq: Four times a day (QID) | INTRAMUSCULAR | Status: DC | PRN

## 2023-01-18 MED ORDER — HEPARIN SODIUM (PORCINE) 1000 UNIT/ML IJ SOLN
INTRAMUSCULAR | Status: DC | PRN
  Administered 2023-01-18: 9000 [IU] via INTRAVENOUS

## 2023-01-18 MED ORDER — FENTANYL CITRATE (PF) 100 MCG/2ML IJ SOLN
INTRAMUSCULAR | Status: DC | PRN
  Administered 2023-01-18: 25 ug via INTRAVENOUS

## 2023-01-18 MED ORDER — SODIUM CHLORIDE 0.9% FLUSH: 3.0000 mL | INTRAVENOUS | Status: DC | PRN

## 2023-01-18 MED ORDER — HEPARIN SODIUM (PORCINE) 1000 UNIT/ML IJ SOLN
INTRAMUSCULAR | Status: AC
  Filled 2023-01-18: qty 10

## 2023-01-18 MED ORDER — MIDAZOLAM HCL 2 MG/2ML IJ SOLN
INTRAMUSCULAR | Status: AC
  Filled 2023-01-18: qty 2

## 2023-01-18 MED ORDER — LIDOCAINE HCL (PF) 1 % IJ SOLN
INTRAMUSCULAR | Status: AC
  Filled 2023-01-18: qty 30

## 2023-01-18 MED ORDER — SODIUM CHLORIDE 0.9% FLUSH: 3.0000 mL | Freq: Two times a day (BID) | INTRAVENOUS | Status: DC

## 2023-01-18 MED ORDER — LABETALOL HCL 5 MG/ML IV SOLN: 10.0000 mg | INTRAVENOUS | Status: DC | PRN

## 2023-01-18 MED ORDER — HYDRALAZINE HCL 20 MG/ML IJ SOLN: 5.0000 mg | INTRAMUSCULAR | Status: DC | PRN

## 2023-01-18 MED ORDER — FENTANYL CITRATE (PF) 100 MCG/2ML IJ SOLN
INTRAMUSCULAR | Status: AC
  Filled 2023-01-18: qty 2

## 2023-01-18 SURGICAL SUPPLY — 16 items
BALLN VIATRAC 6X20X135 (BALLOONS) ×2
BALLOON VIATRAC 6X20X135 (BALLOONS) IMPLANT
CATH OMNI FLUSH 5F 65CM (CATHETERS) IMPLANT
DEVICE CLOSURE MYNXGRIP 6/7F (Vascular Products) IMPLANT
GUIDE CATH VISTA JR4 6F (CATHETERS) IMPLANT
KIT MICROPUNCTURE NIT STIFF (SHEATH) IMPLANT
KIT PV (KITS) ×2 IMPLANT
SHEATH PINNACLE 5F 10CM (SHEATH) IMPLANT
SHEATH PINNACLE 6F 10CM (SHEATH) IMPLANT
STOPCOCK MORSE 400PSI 3WAY (MISCELLANEOUS) IMPLANT
SYR MEDRAD MARK 7 150ML (SYRINGE) ×2 IMPLANT
TRANSDUCER W/STOPCOCK (MISCELLANEOUS) ×2 IMPLANT
TRAY PV CATH (CUSTOM PROCEDURE TRAY) ×2 IMPLANT
WIRE BENTSON .035X145CM (WIRE) IMPLANT
WIRE HITORQ VERSACORE ST 145CM (WIRE) IMPLANT
WIRE STABILIZER XS .014X180CM (WIRE) IMPLANT

## 2023-01-18 NOTE — Op Note (Signed)
    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  Ultrasound-guided access right common femoral artery 2.  Abdominal aortogram with left renal artery arteriogram 3.  Catheter selection of left renal artery 4.  Angioplasty of left renal artery including in-stent stenosis (6 mm x 20 mm Viatrac) 5.  28 minutes of monitored moderate conscious sedation time 6.  Mynx closure of the right common femoral artery  Indications: Patient is a 77 year old male well-known to vascular surgery that previously underwent bilateral renal artery stenting last year on 01/05/2022.  He recently had a CT scan showing in-stent stenosis of his left renal artery stent that looked high-grade.  He presents today for renal artery arteriogram and possible intervention with a focus on the left renal artery.  Findings:   Ultrasound-guided access right common femoral artery.  The abdominal aorta was patent.  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  This was selected with a JR4 guide cath and a 014 stabilizer wire.  Then performed balloon angioplasty of the left renal artery stent with a 6 mm x 20 mm Viatrac.  Widely patent stent at completion.  Filling of the kidney as noted on initial imaging.   Procedure:  The patient was identified in the holding area and taken to room 8.  The patient was then placed supine on the table and prepped and draped in the usual sterile fashion.  A time out was called.  Ultrasound was used to evaluate the right common femoral artery.  It was patent .  A digital ultrasound image was acquired.  A micropuncture needle was used to access the right common femoral artery under ultrasound guidance.  An 018 wire was advanced without resistance and a  micropuncture sheath was placed.  The 018 wire was removed and a benson wire was placed.  The micropuncture sheath was exchanged for a 6 french sheath.  An omniflush catheter was advanced over the wire to the level of L-1.  An abdominal angiogram was obtained including imaging of the left renal artery and stent.  Pertinent findings are noted above.  We elected for intervention.  The patient was given 100 units/kg IV heparin.  I then used a JR4 guide cath with an 014 stabilizer wire and selected the left renal artery stent.  I performed hand-injection with a catheter in the proximal left renal artery stent to confirm I was in the true lumen.  I then selected a 6 mm x 20 mm Viatrac that was inserted over the stabilizer wire and angioplasty was performed of the in-stent stenosis in the left renal artery.  Excellent results.  Widely patent stent.  Preserved filling of the kidney.  Groin access shot was obtained in the right groin.  Mynx closure device was deployed in the right groin and the sheath was removed.  Plan: Excellent results after left renal artery angioplasty today.  Widely patent stent.  No residual stenosis.  Continue aspirin and can resume his Eliquis tomorrow.  Statin therapy.  Cephus Shelling, MD [facility] Office: [phone]

## 2023-01-18 NOTE — Progress Notes (Signed)
Patient and friend was given discharge instructions. Both verbalized understanding. 

## 2023-01-18 NOTE — H&P (Signed)
History and Physical Interval Note:  01/18/2023 1:16 PM  Samuel Ferrell  has presented today for surgery, with the diagnosis of left renal artery stenosis.  The various methods of treatment have been discussed with the patient and family. After consideration of risks, benefits and other options for treatment, the patient has consented to  Procedure(s): RENAL ANGIOGRAPHY (N/A) ABDOMINAL AORTOGRAM (N/A) as a surgical intervention.  The patient's history has been reviewed, patient examined, no change in status, stable for surgery.  I have reviewed the patient's chart and labs.  Questions were answered to the patient's satisfaction.    Evaluate for significant in-stent stenosis left renal stent.  Cephus Shelling     Patient name: Samuel Ferrell  MRN: [id number]        DOB: 08-Sep-1946          Sex: male   REASON FOR VISIT: F/U after CT for surveillance renal artery anerurysms   HPI: Samuel Ferrell is a 77 y.o. male with hx lung cancer, tobacco abuse, afib, COPD, HTN, HLD, renal artery stenosis that presents for follow-up after CT.  He previously had bilateral renal artery stenting on 01/05/2022.  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  He also had an 80% stenosis of the right renal artery at the origin.  He had a 6 x 18 Herculink placed in the left renal artery and a 6 x 15 Herculink placed in the right renal artery.  He remains on aspirin and eliquis.    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        Past Medical History:  Diagnosis Date   Afib [facility])      s/p ablation x2 by Dr Sampson Goon at [facility] in 2007, 2009   Alcohol abuse     Anemia     Atypical atrial flutter (HCC)     COPD (chronic obstructive pulmonary disease) (HCC)     COVID      mild case   CVA (cerebral infarction)     GERD (gastroesophageal reflux disease)     Heart murmur      Hyperlipidemia     Hypertension     Palpitations     Pneumonia      1970's   Skin cancer--melanoma     Stroke [facility])      TIA           Past Surgical History:  Procedure Laterality Date   ANTERIOR CERVICAL DECOMP/DISCECTOMY FUSION N/A 07/24/2018    Procedure: ANTERIOR CERVICAL DECOMPRESSION/DISCECTOMY FUSION, INTERBODY PROSTHESIS, PLATE SCREWS CERVICAL FIVE- CERVICAL SIX;  Surgeon: Tressie Stalker, MD;  Location: [facility] OR;  Service: Neurosurgery;  Laterality: N/A;  ANTERIOR CERVICAL DECOMPRESSION/DISCECTOMY FUSION, INTERBODY PROSTHESIS, PLATE SCREWS CERVICAL FIVE- CERVICAL SIX   APPENDECTOMY       ATRIAL FIBRILLATION ABLATION   2007 ,2009    Dr Sampson Goon at [facility]   BRONCHIAL BIOPSY   04/24/2022    Procedure: BRONCHIAL BIOPSIES;  Surgeon: Omar Person, MD;  Location: [facility] ENDOSCOPY;  Service: Pulmonary;;   BRONCHIAL BRUSHINGS   04/24/2022    Procedure: BRONCHIAL BRUSHINGS;  Surgeon: Omar Person, MD;  Location: [facility] ENDOSCOPY;  Service: Pulmonary;;   BRONCHIAL BRUSHINGS   05/19/2022    Procedure: BRONCHIAL BRUSHINGS;  Surgeon: Omar Person, MD;  Location: [facility] ENDOSCOPY;  Service: Pulmonary;;   BRONCHIAL NEEDLE ASPIRATION  BIOPSY   04/24/2022    Procedure: BRONCHIAL NEEDLE ASPIRATION BIOPSIES;  Surgeon: Omar Person, MD;  Location: [facility] ENDOSCOPY;  Service: Pulmonary;;   BRONCHIAL NEEDLE ASPIRATION BIOPSY   05/19/2022    Procedure: BRONCHIAL NEEDLE ASPIRATION BIOPSIES;  Surgeon: Omar Person, MD;  Location: [facility] ENDOSCOPY;  Service: Pulmonary;;   BRONCHIAL WASHINGS   04/24/2022    Procedure: BRONCHIAL WASHINGS;  Surgeon: Omar Person, MD;  Location: [facility] ENDOSCOPY;  Service: Pulmonary;;   EYE SURGERY Bilateral      cataracts   FIDUCIAL MARKER PLACEMENT   05/19/2022    Procedure: DYE MARKING;  Surgeon: Omar Person, MD;  Location: [facility] ENDOSCOPY;  Service: Pulmonary;;   FRACTURE SURGERY        right clavicle   HEMOSTASIS CONTROL   04/24/2022    Procedure:  HEMOSTASIS CONTROL;  Surgeon: Omar Person, MD;  Location: [facility] ENDOSCOPY;  Service: Pulmonary;;   INTERCOSTAL NERVE BLOCK Left 05/19/2022    Procedure: INTERCOSTAL NERVE BLOCK;  Surgeon: Loreli Slot, MD;  Location[facility] OR;  Service: Thoracic;  Laterality: Left;   LYMPH NODE DISSECTION Left 05/19/2022    Procedure: LYMPH NODE DISSECTION;  Surgeon: Loreli Slot, MD;  Location: [facility] OR;  Service: Thoracic;  Laterality: Left;   PERIPHERAL VASCULAR INTERVENTION Bilateral 01/05/2022    Procedure: PERIPHERAL VASCULAR INTERVENTION;  Surgeon: Cephus Shelling, MD;  [facility]: [facility] INVASIVE CV LAB;  Service: Cardiovascular;  Laterality: Bilateral;  Bilateral Renal Artery Stents   RENAL ANGIOGRAPHY Bilateral 01/05/2022    Procedure: RENAL ARTERY STENTING;  Surgeon: Cephus Shelling, MD;  Location: [facility] INVASIVE CV LAB;  Service: Cardiovascular;  Laterality: Bilateral;   TONSILLECTOMY       VIDEO BRONCHOSCOPY WITH RADIAL ENDOBRONCHIAL ULTRASOUND   04/24/2022    Procedure: VIDEO BRONCHOSCOPY WITH RADIAL ENDOBRONCHIAL ULTRASOUND;  Surgeon: Omar Person, MD;  Location: [facility] ENDOSCOPY;  Service: Pulmonary;;   VIDEO BRONCHOSCOPY WITH RADIAL ENDOBRONCHIAL ULTRASOUND   05/19/2022    Procedure: VIDEO BRONCHOSCOPY WITH RADIAL ENDOBRONCHIAL ULTRASOUND;  Surgeon: Omar Person, MD;  Location: [facility] ENDOSCOPY;  Service: Pulmonary;;           Family History  Problem Relation Age of Onset   Lung cancer Brother          smoked   Cancer Other     Colon cancer Neg Hx     Esophageal cancer Neg Hx     Rectal cancer Neg Hx     Stomach cancer Neg Hx        SOCIAL HISTORY: Social History         Tobacco Use   Smoking status: Every Day      Packs/day: 1.00      Years: 61.00      Additional pack years: 0.00      Total pack years: 61.00      Types: Cigarettes   Smokeless tobacco: Never  Substance Use Topics   Alcohol use: Not Currently      Comment: heavy, up to 6 beers per day       No Known Allergies         Current Outpatient Medications  Medication Sig Dispense Refill   apixaban (ELIQUIS) 5 MG TABS tablet Take 5 mg by mouth 2 (two) times daily.       aspirin EC 81 MG tablet Take 81 mg by mouth daily. Swallow whole.       atorvastatin (LIPITOR) 40 MG tablet Take 40 mg  by mouth every evening.        dofetilide (TIKOSYN) 500 MCG capsule Take 1 capsule (500 mcg total) by mouth 2 (two) times daily. 180 capsule 2   enalapril (VASOTEC) 10 MG tablet Take 1 tablet (10 mg total) by mouth at bedtime. 90 tablet 3   ferrous sulfate 325 (65 FE) MG tablet Take 325 mg by mouth daily with breakfast.       finasteride (PROSCAR) 5 MG tablet Take 5 mg by mouth daily.       Magnesium 250 MG TABS Take 250 mg by mouth every evening.       metoprolol tartrate (LOPRESSOR) 50 MG tablet Take 1.5 tablets (75 mg total) by mouth 2 (two) times daily. 270 tablet 3   Multiple Vitamin (MULTIVITAMIN) tablet Take 1 tablet by mouth daily.       omeprazole (PRILOSEC) 20 MG capsule Take 20 mg by mouth daily.       potassium chloride SA (KLOR-CON M20) 20 MEQ tablet Take 1 tablet (20 mEq total) by mouth daily. 90 tablet 2   Tiotropium Bromide-Olodaterol (STIOLTO RESPIMAT) 2.5-2.5 MCG/ACT AERS Inhale 2 puffs into the lungs daily. 4 g 11   vitamin C (ASCORBIC ACID) 250 MG tablet Take 250 mg by mouth daily.       guaiFENesin (MUCINEX) 600 MG 12 hr tablet Take 2 tablets (1,200 mg total) by mouth 2 (two) times daily as needed for to loosen phlegm or cough. (Patient not taking: Reported on 01/09/2023)       oxyCODONE (OXY IR/ROXICODONE) 5 MG immediate release tablet Take 1 tablet (5 mg total) by mouth every 6 (six) hours as needed for moderate pain. (Patient not taking: Reported on 01/09/2023) 28 tablet 0   pregabalin (LYRICA) 50 MG capsule Take 1 capsule (50 mg total) by mouth 2 (two) times daily. (Patient not taking: Reported on 01/09/2023) 60 capsule 3    No current facility-administered medications for this  visit.      REVIEW OF SYSTEMS:  [X]  denotes positive finding, [ ]  denotes negative finding Cardiac   Comments:  Chest pain or chest pressure:      Shortness of breath upon exertion:      Short of breath when lying flat:      Irregular heart rhythm:             Vascular      Pain in calf, thigh, or hip brought on by ambulation:      Pain in feet at night that wakes you up from your sleep:       Blood clot in your veins:      Leg swelling:              Pulmonary      Oxygen at home:      Productive cough:       Wheezing:              Neurologic      Sudden weakness in arms or legs:       Sudden numbness in arms or legs:       Sudden onset of difficulty speaking or slurred speech:      Temporary loss of vision in one eye:       Problems with dizziness:              Gastrointestinal      Blood in stool:       Vomited blood:  Genitourinary      Burning when urinating:       Blood in urine:             Psychiatric      Major depression:              Hematologic      Bleeding problems:      Problems with blood clotting too easily:             Skin      Rashes or ulcers:             Constitutional      Fever or chills:          PHYSICAL EXAM:    Vitals:    01/09/23 1255  BP: 119/66  Pulse: 83  Resp: 16  Temp: 98.2 F (36.8 C)  TempSrc: Temporal  SpO2: 94%  Weight: 208 lb (94.3 kg)  Height: 6' 1.5" (1.867 Ferrell)      GENERAL: The patient is a well-nourished male, in no acute distress. The vital signs are documented above. CARDIAC: There is a regular rate and rhythm.  VASCULAR:  Bilateral femoral pulses palpable PULMONARY: No respiratory distress. ABDOMEN: Soft and non-tender. MUSCULOSKELETAL: There are no major deformities or cyanosis. NEUROLOGIC: No focal weakness or paresthesias are detected. SKIN: There are no ulcers or rashes noted. PSYCHIATRIC: The patient has a normal affect.   DATA:    CTA reviewed from 01/03/23 and right renal  artery aneurysms measure 0.7 cm and 1.2 cm with minimal change.  Right renal stent widely patent.  Left renal stent with in-stent stenosis and apparent thrombus.   Renal artery duplex previously showed no evidence of recurrent flow-limiting stenosis in either renal artery.   Assessment/Plan:   Samuel Ferrell that presents for follow-up for surveillance of his 2 small right renal artery aneurysms.  I repeated a CT scan and discussed there has been no significant interval change since his CT last year in March 2023 with his aneurysms measuring 1.2 cm and 0.7 cm.  Discussed typical repair of renal artery aneurysms is >3 cm.     The CTA did suggest in-stent stenosis of his left renal stent.  The stent was previously difficult to visualize on duplex although no focal stenosis was identified.  I reviewed the CT imaging that does show what looks like a significant in-stent stenosis.  I have recommended aortogram with renal artery arteriogram a possible left renal artery intervention.  Discussed this being done in the Cath Lab through a transfemoral approach.  Risk and benefits discussed.  Again stressed the importance of smoking cessation.             Cephus Shelling, MD [facility] Office: [phone]

## 2023-01-19 ENCOUNTER — Encounter (HOSPITAL_COMMUNITY): Payer: Self-pay | Admitting: Vascular Surgery

## 2023-02-19 ENCOUNTER — Other Ambulatory Visit: Payer: Self-pay | Admitting: *Deleted

## 2023-02-19 DIAGNOSIS — I722 Aneurysm of renal artery: Secondary | ICD-10-CM

## 2023-02-19 DIAGNOSIS — I701 Atherosclerosis of renal artery: Secondary | ICD-10-CM

## 2023-02-27 ENCOUNTER — Ambulatory Visit (HOSPITAL_COMMUNITY)
Admission: RE | Admit: 2023-02-27 | Discharge: 2023-02-27 | Disposition: A | Discharge status: Home / Self Care | Payer: No Typology Code available for payment source | Source: Ambulatory Visit | Attending: Vascular Surgery | Admitting: Vascular Surgery

## 2023-02-27 ENCOUNTER — Ambulatory Visit (INDEPENDENT_AMBULATORY_CARE_PROVIDER_SITE_OTHER): Payer: No Typology Code available for payment source | Admitting: Physician Assistant

## 2023-02-27 VITALS — BP 149/67 | HR 65 | Temp 98.0°F | Resp 18 | Ht 73.0 in | Wt 204.5 lb

## 2023-02-27 DIAGNOSIS — I722 Aneurysm of renal artery: Secondary | ICD-10-CM

## 2023-02-27 DIAGNOSIS — I701 Atherosclerosis of renal artery: Secondary | ICD-10-CM

## 2023-02-27 NOTE — Progress Notes (Signed)
Office Note     CC:  follow up Requesting Provider:  Gaspar Garbe, MD  HPI: Samuel Ferrell is a 77 y.o. (23-Jul-1946) male who presents status post left renal artery angioplasty due to in-stent stenosis by Dr. Chestine Spore on 01/18/2023.  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  He denies any changes in blood pressure.  He is on Eliquis for atrial fibrillation.  He is also taking aspirin and a statin daily.  He is a current smoker with no interest in quitting.   Past Medical History:  Diagnosis Date   Afib [facility])    s/p ablation x2 by Dr Sampson Goon at [facility] in 2007, 2009   Alcohol abuse    Anemia    Atypical atrial flutter (HCC)    COPD (chronic obstructive pulmonary disease) (HCC)    COVID    mild case   CVA (cerebral infarction)    GERD (gastroesophageal reflux disease)    Heart murmur    Hyperlipidemia    Hypertension    Palpitations    Pneumonia    1970's   Skin cancer--melanoma    Stroke [facility])    TIA    Past Surgical History:  Procedure Laterality Date   ABDOMINAL AORTOGRAM N/A 01/18/2023   Procedure: ABDOMINAL AORTOGRAM;  Surgeon: Cephus Shelling, MD;  [facility] INVASIVE CV LAB;  Service: Cardiovascular;  Laterality: N/A;   ANTERIOR CERVICAL DECOMP/DISCECTOMY FUSION N/A 07/24/2018   Procedure: ANTERIOR CERVICAL DECOMPRESSION/DISCECTOMY FUSION, INTERBODY PROSTHESIS, PLATE SCREWS CERVICAL FIVE- CERVICAL SIX;  Surgeon: Tressie Stalker, MD;  Location: [facility] OR;  Service: Neurosurgery;  Laterality: N/A;  ANTERIOR CERVICAL DECOMPRESSION/DISCECTOMY FUSION, INTERBODY PROSTHESIS, PLATE SCREWS CERVICAL FIVE- CERVICAL SIX   APPENDECTOMY     ATRIAL FIBRILLATION ABLATION  2007 ,2009   Dr Sampson Goon at [facility]   BRONCHIAL BIOPSY  04/24/2022   Procedure: BRONCHIAL BIOPSIES;  Surgeon: Omar Person, MD;  Location: [facility] ENDOSCOPY;  Service: Pulmonary;;    BRONCHIAL BRUSHINGS  04/24/2022   Procedure: BRONCHIAL BRUSHINGS;  Surgeon: Omar Person, MD;  Location: [facility] ENDOSCOPY;  Service: Pulmonary;;   BRONCHIAL BRUSHINGS  05/19/2022   Procedure: BRONCHIAL BRUSHINGS;  Surgeon: Omar Person, MD;  Location: [facility] ENDOSCOPY;  Service: Pulmonary;;   BRONCHIAL NEEDLE ASPIRATION BIOPSY  04/24/2022   Procedure: BRONCHIAL NEEDLE ASPIRATION BIOPSIES;  Surgeon: Omar Person, MD;  Location: [facility] ENDOSCOPY;  Service: Pulmonary;;   BRONCHIAL NEEDLE ASPIRATION BIOPSY  05/19/2022   Procedure: BRONCHIAL NEEDLE ASPIRATION BIOPSIES;  Surgeon: Omar Person, MD;  Location: [facility] ENDOSCOPY;  Service: Pulmonary;;   BRONCHIAL WASHINGS  04/24/2022   Procedure: BRONCHIAL WASHINGS;  Surgeon: Omar Person, MD;  Location: [facility] ENDOSCOPY;  Service: Pulmonary;;   EYE SURGERY Bilateral    cataracts   FIDUCIAL MARKER PLACEMENT  05/19/2022   Procedure: DYE MARKING;  Surgeon: Omar Person, MD;  Location: [facility] ENDOSCOPY;  Service: Pulmonary;;   FRACTURE SURGERY     right clavicle   HEMOSTASIS CONTROL  04/24/2022   Procedure: HEMOSTASIS CONTROL;  Surgeon: Omar Person, MD;  Location: [facility] ENDOSCOPY;  Service: Pulmonary;;   INTERCOSTAL NERVE BLOCK Left 05/19/2022   Procedure: INTERCOSTAL NERVE BLOCK;  Surgeon: Loreli Slot, MD;  Location: [facility] OR;  Service: Thoracic;  Laterality: Left;   LYMPH NODE DISSECTION Left 05/19/2022   Procedure: LYMPH NODE DISSECTION;  Surgeon: Loreli Slot, MD;  Location: [facility]  OR;  Service: Thoracic;  Laterality: Left;   PERIPHERAL VASCULAR INTERVENTION Bilateral 01/05/2022   Procedure: PERIPHERAL VASCULAR INTERVENTION;  Surgeon: Cephus Shelling, MD;  Location: [facility] INVASIVE CV LAB;  Service: Cardiovascular;  Laterality: Bilateral;  Bilateral Renal Artery Stents   RENAL ANGIOGRAPHY Bilateral 01/05/2022   Procedure: RENAL ARTERY STENTING;  Surgeon: Cephus Shelling, MD;  Location: [facility] INVASIVE CV LAB;  Service:  Cardiovascular;  Laterality: Bilateral;   RENAL ANGIOGRAPHY N/A 01/18/2023   Procedure: RENAL ANGIOGRAPHY;  Surgeon: Cephus Shelling, MD;  Location: [facility] INVASIVE CV LAB;  Service: Cardiovascular;  Laterality: N/A;   RENAL INTERVENTION  01/18/2023   Procedure: RENAL INTERVENTION;  Surgeon: Cephus Shelling, MD;  Location: [facility]VAS[facility] CV LAB;  Service: Cardiovascular;;   TONSILLECTOMY     VIDEO BRONCHOSCOPY WITH RADIAL ENDOBRONCHIAL ULTRASOUND  04/24/2022   Procedure: VIDEO BRONCHOSCOPY WITH RADIAL ENDOBRONCHIAL ULTRASOUND;  Surgeon: Omar Person, MD;  Location: [facility] ENDOSCOPY;  Service: Pulmonary;;   VIDEO BRONCHOSCOPY WITH RADIAL ENDOBRONCHIAL ULTRASOUND  05/19/2022   Procedure: VIDEO BRONCHOSCOPY WITH RADIAL ENDOBRONCHIAL ULTRASOUND;  Surgeon: Omar Person, MD;  Location: [facility] ENDOSCOPY;  Service: Pulmonary;;    Social History   Socioeconomic History   Marital status: Significant Other    Spouse name: Not on file   Number of children: 2   Years of education: Not on file   Highest education level: Not on file  Occupational History   Occupation: Retired  Tobacco Use   Smoking status: Every Day    Packs/day: 1.00    Years: 61.00    Additional pack years: 0.00    Total pack years: 61.00    Types: Cigarettes   Smokeless tobacco: Never  Vaping Use   Vaping Use: Never used  Substance and Sexual Activity   Alcohol use: Not Currently    Comment: heavy, up to 6 beers per day   Drug use: No   Sexual activity: Yes  Other Topics Concern   Not on file  Social History Narrative   Lives in [place].  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     aspirin EC 81 MG tablet Take 81 mg by mouth daily. Swallow whole.     atorvastatin (LIPITOR) 40 MG tablet Take 40 mg by mouth every evening.      dofetilide (TIKOSYN) 500 MCG capsule Take 1 capsule (500 mcg total) by mouth 2 (two) times daily. 180 capsule 2   enalapril (VASOTEC) 10 MG tablet Take 1 tablet (10 mg total) by mouth at bedtime. 90 tablet 3   ferrous sulfate 325 (65 FE) MG tablet Take 325 mg by mouth daily with breakfast.     guaiFENesin (MUCINEX) 600 MG 12 hr  tablet Take 2 tablets (1,200 mg total) by mouth 2 (two) times daily as needed for to loosen phlegm or cough. (Patient not taking: Reported on 01/09/2023)     Magnesium 250 MG TABS Take 250 mg by mouth every evening.     metoprolol tartrate (LOPRESSOR) 50 MG tablet Take 1.5 tablets (75 mg total) by mouth 2 (two) times daily. 270 tablet 3   Multiple Vitamin (MULTIVITAMIN) tablet Take 1 tablet by mouth daily. Centrum 50+     omeprazole (PRILOSEC) 20 MG capsule Take 20 mg by mouth daily.     potassium chloride SA (KLOR-CON M20) 20 MEQ tablet Take 1 tablet (20 mEq total) by mouth daily. 90 tablet 2   pregabalin (LYRICA) 50 MG capsule Take 1 capsule (50 mg total) by mouth 2 (two) times daily. (Patient not taking: Reported on 01/09/2023) 60 capsule 3   Tiotropium Bromide-Olodaterol (STIOLTO RESPIMAT) 2.5-2.5 MCG/ACT AERS Inhale 2 puffs into the lungs daily. (Patient taking differently: Inhale 1 puff into the lungs 2 (two) times daily.) 4 g 11   vitamin C (ASCORBIC ACID) 250 MG tablet Take 250 mg by mouth daily.     No current facility-administered medications for this visit.    No Known Allergies   REVIEW OF SYSTEMS:   [X]  denotes positive finding, [  ] denotes negative finding Cardiac  Comments:  Chest pain or chest pressure:    Shortness of breath upon exertion:    Short of breath when lying flat:    Irregular heart rhythm:        Vascular    Pain in calf, thigh, or hip brought on by ambulation:    Pain in feet at night that wakes you up from your sleep:     Blood clot in your veins:    Leg swelling:         Pulmonary    Oxygen at home:    Productive cough:     Wheezing:         Neurologic    Sudden weakness in arms or legs:     Sudden numbness in arms or legs:     Sudden onset of difficulty speaking or slurred speech:    Temporary loss of vision in one eye:     Problems with dizziness:         Gastrointestinal    Blood in stool:     Vomited blood:         Genitourinary    Burning when urinating:     Blood in urine:        Psychiatric    Major depression:         Hematologic    Bleeding problems:    Problems with blood clotting too easily:        Skin    Rashes or ulcers:        Constitutional    Fever or chills:      PHYSICAL EXAMINATION:  Vitals:   02/27/23 0950  BP: (!) 149/67  Pulse: 65  Resp: 18  Temp: 98 F (36.7 C)  TempSrc: Temporal  SpO2: 99%  Weight: 204 lb 8 oz (92.8 kg)  Height: 6\' 1"  (1.854 m)    General:  WDWN in NAD; vital signs documented above Gait: Not observed HENT: WNL, normocephalic Pulmonary: normal non-labored breathing , without Rales, rhonchi,  wheezing Cardiac: regular HR Abdomen: soft, NT, no masses Skin: without rashes Vascular Exam/Pulses: palpable radial pulses; palpable right femoral pulse without hematoma at  cath site Extremities: without ischemic changes, without Gangrene , without cellulitis; without open wounds;  Musculoskeletal: no muscle wasting or atrophy  Neurologic: A&O X 3 Psychiatric:  The pt has Normal affect.   Non-Invasive Vascular Imaging:   Left renal artery widely patent by duplex    ASSESSMENT/PLAN:: 77 y.o. male status post  bilateral renal artery stenting  -Mr. Juris is a 77 year old male who underwent bilateral renal artery stenting in April 2023.  On follow-up CT he was found to have in-stent stenosis of the left renal artery stent.  He then underwent repeat angiography on 01/18/2023 and had angioplasty of in-stent stenosis with complete resolution.  Today, duplex demonstrates a widely patent left renal artery stent.  He will continue his aspirin and statin daily.  He will follow-up in 1 year with a repeat renal artery duplex.   Emilie Rutter, PA-C [facility] [phone]  Clinic MD:   Chestine Spore

## 2023-03-10 ENCOUNTER — Other Ambulatory Visit: Payer: Self-pay

## 2023-03-10 DIAGNOSIS — I722 Aneurysm of renal artery: Secondary | ICD-10-CM

## 2023-03-10 DIAGNOSIS — I701 Atherosclerosis of renal artery: Secondary | ICD-10-CM

## 2023-04-09 ENCOUNTER — Other Ambulatory Visit: Payer: Self-pay | Admitting: Thoracic Surgery (Cardiothoracic Vascular Surgery)

## 2023-04-09 DIAGNOSIS — Z902 Acquired absence of lung [part of]: Secondary | ICD-10-CM

## 2023-04-10 ENCOUNTER — Ambulatory Visit
Admission: RE | Admit: 2023-04-10 | Discharge: 2023-04-10 | Disposition: A | Discharge status: Home / Self Care | Payer: No Typology Code available for payment source | Source: Ambulatory Visit | Attending: Thoracic Surgery (Cardiothoracic Vascular Surgery) | Admitting: Thoracic Surgery (Cardiothoracic Vascular Surgery)

## 2023-04-10 ENCOUNTER — Encounter: Payer: Self-pay | Admitting: Thoracic Surgery (Cardiothoracic Vascular Surgery)

## 2023-04-10 ENCOUNTER — Ambulatory Visit (INDEPENDENT_AMBULATORY_CARE_PROVIDER_SITE_OTHER): Payer: No Typology Code available for payment source | Admitting: Thoracic Surgery (Cardiothoracic Vascular Surgery)

## 2023-04-10 VITALS — BP 138/76 | HR 61 | Resp 18 | Ht 73.0 in | Wt 213.0 lb

## 2023-04-10 DIAGNOSIS — Z902 Acquired absence of lung [part of]: Secondary | ICD-10-CM | POA: Diagnosis not present

## 2023-04-10 NOTE — Progress Notes (Signed)
[address]       [name] [address]             [phone]     HPI: [name] returns for a scheduled follow-up visit  Samuel Ferrell is a 77 year old man with a history of tobacco abuse, COPD, lung cancer, hypertension, hyperlipidemia, atrial fibrillation, atypical atrial flutter, TIA, anemia, reflux, melanoma, and ethanol abuse.  He underwent a left upper lobectomy and node dissection on 05/19/2022.  Postoperative course was notable for hoarseness and intercostal neuralgia pain.  His intercostal neuralgia pain improved with Lyrica.  He saw Dr. Gerilyn Pilgrim of ENT at the [facility].  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  A follow-up scope in March showed no evidence of vocal cord paralysis or paresis.  She recommended speech therapy, but he refused.  He says his voice has returned and he does not need any further ENT follow-up.  He does say he still has days where his voice is weaker than others.  Saw oncology at the [facility].  CT in March 2024 showed no evidence of recurrence.  There are some stable subsolid nodules in the right upper lobe.  Primary complaint today is lack of energy and shortness of breath with activity.  Also has a cough particularly at night and in the mornings.  Past Medical History:  Diagnosis Date   Afib [facility])    s/p ablation x2 by Dr Sampson Goon at [facility] in 2007, 2009   Alcohol abuse    Anemia    Atypical atrial flutter (HCC)    COPD (chronic obstructive pulmonary disease) (HCC)    COVID    mild case   CVA (cerebral infarction)    GERD (gastroesophageal reflux disease)    Heart murmur    Hyperlipidemia    Hypertension    Palpitations    Pneumonia    1970's   Skin cancer--melanoma    Stroke (HCC)    TIA    Current Outpatient Medications  Medication Sig Dispense Refill   apixaban (ELIQUIS) 5 MG TABS tablet Take 5 mg by mouth 2 (two) times daily.     aspirin EC 81 MG tablet Take 81 mg by mouth daily. Swallow whole.     atorvastatin  (LIPITOR) 40 MG tablet Take 40 mg by mouth every evening.      dofetilide (TIKOSYN) 500 MCG capsule Take 1 capsule (500 mcg total) by mouth 2 (two) times daily. 180 capsule 2   enalapril (VASOTEC) 10 MG tablet Take 1 tablet (10 mg total) by mouth at bedtime. 90 tablet 3   ferrous sulfate 325 (65 FE) MG tablet Take 325 mg by mouth daily with breakfast. 250mg  per patient     Magnesium 250 MG TABS Take 250 mg by mouth every evening.     metoprolol tartrate (LOPRESSOR) 50 MG tablet Take 1.5 tablets (75 mg total) by mouth 2 (two) times daily. 270 tablet 3   Multiple Vitamin (MULTIVITAMIN) tablet Take 1 tablet by mouth daily. Centrum 50+     omeprazole (PRILOSEC) 20 MG capsule Take 20 mg by mouth daily.     potassium chloride SA (KLOR-CON M20) 20 MEQ tablet Take 1 tablet (20 mEq total) by mouth daily. 90 tablet 2   Tiotropium Bromide-Olodaterol (STIOLTO RESPIMAT) 2.5-2.5 MCG/ACT AERS Inhale 2 puffs into the lungs daily. (Patient taking differently: Inhale 1 puff into the lungs 2 (two) times daily.) 4 g 11   vitamin C (ASCORBIC ACID) 250 MG tablet  Take 250 mg by mouth daily.     No current facility-administered medications for this visit.    Physical Exam BP 138/76 (BP Location: Left Arm, Patient Position: Sitting)   Pulse 61   Resp 18   Ht 6\' 1"  (1.854 m)   Wt 213 lb (96.6 kg)   SpO2 96% Comment: RA  BMI 28.52 kg/m  77 year old man in no acute distress Alert and oriented x 3 with no focal deficits Diminished breath sounds at left base, otherwise clear with no wheezing Incisions well-healed No cervical or supraclavicular adenopathy No peripheral edema  Diagnostic Tests: CHEST - 2 VIEW   COMPARISON:  10/03/2022   FINDINGS: Heart size is normal. Chronic aortic atherosclerosis. Chronic scarring at the lung apices. Chronic pleural calcification. Previous lobectomy on the left. No unexpected or worsening finding.   IMPRESSION: No acute finding. Previous lobectomy on the left. Chronic  scarring at the lung apices. Chronic pleural calcification.     Electronically Signed   By: Paulina Fusi M.D.   On: 04/10/2023 09:32 I personally reviewed the chest x-ray images.  Postoperative changes on left.  Impression: Samuel Ferrell is a 77 year old man with a history of tobacco abuse, COPD, lung cancer, hypertension, hyperlipidemia, atrial fibrillation, atypical atrial flutter, TIA, anemia, reflux, melanoma, and ethanol abuse.  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  No evidence of recurrent disease.  He is scheduled to have another CT in September through the [place].  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  Voice has improved although still has some issues from time to time.  Refused speech therapy.  Lack of energy and dyspnea on exertion-encouraged him to consider pulmonary rehab but he does not want to do so at this time.  Tobacco abuse-continues to smoke with no plans to stop.  Encouraged cessation but he is resistant to the idea.  Plan: [facility] Return in 6 months with PA lateral chest x-ray  I spent over 20 minutes in review of records, images, and in consultation with Samuel Ferrell today. Loreli Slot, MD [facility] [phone]

## 2023-09-19 ENCOUNTER — Telehealth: Payer: Self-pay | Admitting: Student

## 2023-09-19 NOTE — Telephone Encounter (Signed)
 [facility] for this patient to be faxed to [phone] Attention: Vanessa Ralphs

## 2023-09-21 ENCOUNTER — Telehealth (HOSPITAL_COMMUNITY): Payer: Self-pay

## 2023-09-21 NOTE — Telephone Encounter (Signed)
Outside/paper referral received by Dr. Charline Bills from the [place].

## 2023-09-21 NOTE — Telephone Encounter (Signed)
 Called patient to see if he is interested in the Pulmonary Rehab Program. Patient expressed interest. Explained scheduling process and went over insurance, patient verbalized understanding. Pt express he may have issues with transportation, but will contact the [facility] to set up transportation.  Will pass to RN for review.

## 2023-09-24 NOTE — Telephone Encounter (Signed)
 PFT printed and faxed to the [place] in [facility]asing's attn. Received confirmation that the fax went through.

## 2023-10-10 ENCOUNTER — Other Ambulatory Visit: Payer: Self-pay | Admitting: *Deleted

## 2023-10-10 DIAGNOSIS — J432 Centrilobular emphysema: Secondary | ICD-10-CM

## 2023-10-11 ENCOUNTER — Ambulatory Visit: Payer: No Typology Code available for payment source

## 2023-10-11 ENCOUNTER — Ambulatory Visit (INDEPENDENT_AMBULATORY_CARE_PROVIDER_SITE_OTHER): Payer: Medicare Other | Admitting: Nurse Practitioner

## 2023-10-11 ENCOUNTER — Encounter: Payer: Self-pay | Admitting: Nurse Practitioner

## 2023-10-11 VITALS — BP 148/80 | HR 87 | Ht 73.0 in | Wt 219.0 lb

## 2023-10-11 DIAGNOSIS — C3492 Malignant neoplasm of unspecified part of left bronchus or lung: Secondary | ICD-10-CM

## 2023-10-11 DIAGNOSIS — J449 Chronic obstructive pulmonary disease, unspecified: Secondary | ICD-10-CM

## 2023-10-11 DIAGNOSIS — J432 Centrilobular emphysema: Secondary | ICD-10-CM

## 2023-10-11 LAB — PULMONARY FUNCTION TEST
DL/VA % pred: 76 %
DL/VA: 2.99 ml/min/mmHg/L
DLCO cor % pred: 65 %
DLCO cor: 18 ml/min/mmHg
DLCO unc % pred: 65 %
DLCO unc: 18 ml/min/mmHg
FEF 25-75 Post: 1.75 L/s
FEF 25-75 Pre: 1.41 L/s
FEF2575-%Change-Post: 24 %
FEF2575-%Pred-Post: 71 %
FEF2575-%Pred-Pre: 57 %
FEV1-%Change-Post: 1 %
FEV1-%Pred-Post: 70 %
FEV1-%Pred-Pre: 68 %
FEV1-Post: 2.4 L
FEV1-Pre: 2.35 L
FEV1FVC-%Change-Post: 0 %
FEV1FVC-%Pred-Pre: 95 %
FEV6-%Change-Post: 1 %
FEV6-%Pred-Post: 77 %
FEV6-%Pred-Pre: 76 %
FEV6-Post: 3.45 L
FEV6-Pre: 3.41 L
FEV6FVC-%Pred-Post: 106 %
FEV6FVC-%Pred-Pre: 106 %
FVC-%Change-Post: 1 %
FVC-%Pred-Post: 73 %
FVC-%Pred-Pre: 72 %
FVC-Post: 3.45 L
FVC-Pre: 3.41 L
Post FEV1/FVC ratio: 70 %
Post FEV6/FVC ratio: 100 %
Pre FEV1/FVC ratio: 69 %
Pre FEV6/FVC Ratio: 100 %
RV % pred: 114 %
RV: 3.14 L
TLC % pred: 90 %
TLC: 6.94 L

## 2023-10-11 NOTE — Assessment & Plan Note (Signed)
Moderate COPD.  Relatively low symptom burden.  Suspect he would be able to operate the interlock system but may need reduced settings.  Completed his form today.  He will [facility] with the [facility] for [facility].  Appears compensated on current regimen with Stiolto and Asmanex.  He would like to continue his pulmonary management at the [place].  He will reach out to [place] if he needs anything otherwise can follow-up as needed.  Action plan in place.  Encouraged to remain active.  Smoking cessation strongly advised.  Patient Instructions  Continue COPD regimen as prescribed by the [facility] - Stiolto 2 puffs daily, Asmanex 2 puffs daily. Brush tongue and rinse mouth afterwards Continue Albuterol inhaler 2 puffs neb every 6 hours as needed for shortness of breath or wheezing. Notify if symptoms persist despite rescue inhaler/neb use.   Follow up with Dr. Dorris Fetch as scheduled  Lung function testing showed moderate COPD   Follow up with [place] as needed. You can continue with the [facility] otherwise

## 2023-10-11 NOTE — Patient Instructions (Signed)
Full PFT performed today.

## 2023-10-11 NOTE — Patient Instructions (Addendum)
Continue COPD regimen as prescribed by the [facility] - Stiolto 2 puffs daily, Asmanex 2 puffs daily. Brush tongue and rinse mouth afterwards Continue Albuterol inhaler 2 puffs neb every 6 hours as needed for shortness of breath or wheezing. Notify if symptoms persist despite rescue inhaler/neb use.   Follow up with Dr. Dorris Fetch as scheduled  Lung function testing showed moderate COPD   Follow up with [place] as needed. You can [facility] with the [facility] otherwise

## 2023-10-11 NOTE — Progress Notes (Signed)
Full PFT performed today for DMV.

## 2023-10-11 NOTE — Progress Notes (Signed)
@Patient  ID: Samuel Ferrell, male    DOB: 1945-12-21, 78 y.o.   MRN: [id number]  Chief Complaint  Patient presents with   Follow-up    Referring provider: Tisovec, Adelfa Koh, MD  HPI: 78 year old male, active smoker followed for emphysema/COPD and lung nodule.  He is a patient of Dr. Lind Guest and last seen in office 04/06/2022.  He underwent bronchoscopy August 2023 which did not reveal any malignant cells.  Nodule remains suspicious so he was referred to CT surgery and underwent left upper lobectomy September 2023.  Cytology consistent with adenocarcinoma.  Past medical history significant for PAF on Eliquis, renal artery stenosis, history of TIA, EtOH abuse.  TEST/EVENTS:  04/12/2022 PET scan: Hypermetabolic left upper lobe pulmonary nodule, suspicious for primary bronchogenic carcinoma.  No other hypermetabolism.  Indeterminate density left adrenal nodule, stable in size with no significant hypermetabolic activity, likely incidental adenoma.  Hypermetabolic left maxillary molar.  Dental disease 04/27/2022 PFT: FVC 67, FEV1 60, ratio 66, TLC 108, DLCO 73 05/2022 pathology left lung nodule >> adenocarcinoma 10/03/2022 CXR: Biapical pleural-parenchymal scarring and calcified pleural plaques.  Stable postsurgical changes. 10/11/2023 PFT: FVC 72, FEV1 68, ratio 70, TLC 90, DLCO 65.  No BD.  Moderate obstructive lung disease with moderate diffusion defect  04/06/2022: OV with Dr. Thora Lance.  Referred from [facility] for pulmonary nodule.  Had growing 9 mm left upper lobe nodule and other 6 to 8 mm nodules noted.  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  States he feels fine.  Has no DOE.  No cough.  Weight is actually increased over the last year.  Brother died from lung cancer.  Worked in [employer] for 30 years or so.  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  61-pack-year history.  Has done some heavy drinking for a while, quit 5 years ago.  Schedule PFTs.  Ordered PET scan for further evaluation.  Started on Anoro.  Tentatively planned bronchoscopy.  Smoking cessation advised.  10/11/2023: Today-follow-up Patient presents today for overdue follow-up as well as PFT review.  He does have interlock forms that he needs completed.  PFTs showed relatively stable lung volumes compared to testing prior to his left upper lobectomy.  He continues to have a moderate obstructive lung disease without reversibility.  DLCO is reduced at 65% and corrects to 76% for alveolar volume.  He does not really have any issues with his breathing.  Feels like he gets short winded with some strenuous activities but does not impact him on a day-to-day basis.  Has an occasional cough in the morning and in the evening with clear to white phlegm, which is normal for him.  Still smoking about half pack a day.  Denies any hemoptysis, wheezing, chest congestion, weight loss, anorexia. He follows with Dr. Dorris Fetch.  Chest x-rays have been stable.  He also is followed at the [place] and undergoes surveillance CTs which have also been stable.  He is currently receiving his pulmonary care at the [facility] and would like to keep all of his care there for now.  He came here just because he needed a second PFT read for the interlock.  No Known Allergies  Immunization History  Administered Date(s) Administered   Influenza Inj Mdck Quad Pf 06/13/2018   Influenza Split 06/29/2010, 05/07/2013, 06/11/2014  Influenza, High Dose Seasonal PF 05/25/2015, 06/04/2019   Influenza, Quadrivalent, Recombinant, Inj, Pf 07/12/2021   Influenza-Unspecified 09/24/2007, 05/18/2016, 06/04/2017, 06/04/2019, 06/18/2021   Moderna Covid-19 Fall Seasonal Vaccine 33yrs & older 09/15/2022   Moderna Covid-19 Vaccine Bivalent Booster 13yrs & up 09/20/2021   PFIZER(Purple Top)SARS-COV-2 Vaccination  11/09/2019, 12/09/2019, 09/10/2020, 07/12/2021   PNEUMOCOCCAL CONJUGATE-20 03/20/2022   Pneumococcal Conjugate-13 12/09/2015   Pneumococcal Polysaccharide-23 05/03/2012, 11/06/2012   Td 10/27/2009   Tdap 03/17/2021   Zoster Recombinant(Shingrix) 03/17/2021, 06/14/2021    Past Medical History:  Diagnosis Date   Afib [facility])    s/p ablation x2 by Dr Sampson Goon at [facility] in 2007, 2009   Alcohol abuse    Anemia    Atypical atrial flutter (HCC)    COPD (chronic obstructive pulmonary disease) (HCC)    COVID    mild case   CVA (cerebral infarction)    GERD (gastroesophageal reflux disease)    Heart murmur    Hyperlipidemia    Hypertension    Palpitations    Pneumonia    1970's   Skin cancer--melanoma    Stroke (HCC)    TIA    Tobacco History: Social History   Tobacco Use  Smoking Status Every Day   Current packs/day: 1.00   Average packs/day: 1 pack/day for 61.0 years (61.0 ttl pk-yrs)   Types: Cigarettes  Smokeless Tobacco Never   Ready to quit: Not Answered Counseling given: Not Answered   Outpatient Medications Prior to Visit  Medication Sig Dispense Refill   apixaban (ELIQUIS) 5 MG TABS tablet Take 5 mg by mouth 2 (two) times daily.     aspirin EC 81 MG tablet Take 81 mg by mouth daily. Swallow whole.     atorvastatin (LIPITOR) 40 MG tablet Take 40 mg by mouth every evening.      dofetilide (TIKOSYN) 500 MCG capsule Take 1 capsule (500 mcg total) by mouth 2 (two) times daily. 180 capsule 2   enalapril (VASOTEC) 10 MG tablet Take 1 tablet (10 mg total) by mouth at bedtime. 90 tablet 3   ferrous sulfate 325 (65 FE) MG tablet Take 325 mg by mouth daily with breakfast. 250mg  per patient     Magnesium 250 MG TABS Take 250 mg by mouth every evening.     metoprolol tartrate (LOPRESSOR) 50 MG tablet Take 1.5 tablets (75 mg total) by mouth 2 (two) times daily. 270 tablet 3   Multiple Vitamin (MULTIVITAMIN) tablet Take 1 tablet by mouth daily. Centrum 50+     omeprazole  (PRILOSEC) 20 MG capsule Take 20 mg by mouth daily.     potassium chloride SA (KLOR-CON M20) 20 MEQ tablet Take 1 tablet (20 mEq total) by mouth daily. 90 tablet 2   Tiotropium Bromide-Olodaterol (STIOLTO RESPIMAT) 2.5-2.5 MCG/ACT AERS Inhale 2 puffs into the lungs daily. (Patient taking differently: Inhale 1 puff into the lungs 2 (two) times daily.) 4 g 11   vitamin C (ASCORBIC ACID) 250 MG tablet Take 250 mg by mouth daily.     No facility-administered medications prior to visit.     Review of Systems:   Constitutional: No weight loss or gain, night sweats, fevers, chills, fatigue, or lassitude. HEENT: No headaches, difficulty swallowing, tooth/dental problems, or sore throat. No sneezing, itching, ear ache, nasal congestion, or post nasal drip CV:  No chest pain, orthopnea, PND, swelling in lower extremities, anasarca, dizziness, palpitations, syncope Resp: +minimal shortness of breath with exertion; chronic cough. No excess mucus or change in color of  mucus. No hemoptysis. No wheezing.  No chest wall deformity GI:  No heartburn, indigestion, abdominal pain, nausea, vomiting, diarrhea, change in bowel habits, loss of appetite, bloody stools.  GU: No dysuria, change in color of urine, urgency or frequency.  Skin: No rash, lesions, ulcerations MSK:  No joint pain or swelling.   Neuro: No dizziness or lightheadedness.  Psych: No depression or anxiety. Mood stable.     Physical Exam:  BP (!) 148/80 (BP Location: Left Arm, Patient Position: Sitting, Cuff Size: Normal)   Pulse 87   Ht 6\' 1"  (1.854 m)   Wt 219 lb (99.3 kg)   SpO2 99%   BMI 28.89 kg/m   GEN: Pleasant, interactive, well-appearing; in no acute distress HEENT:  Normocephalic and atraumatic. PERRLA. Sclera white. Nasal turbinates pink, moist and patent bilaterally. No rhinorrhea present. Oropharynx pink and moist, without exudate or edema. No lesions, ulcerations, or postnasal drip.  NECK:  Supple w/ fair ROM. Thyroid  symmetrical with no goiter or nodules palpated. No lymphadenopathy.   CV: RRR, no m/r/g, no peripheral edema. Pulses intact, +2 bilaterally. No cyanosis, pallor or clubbing. PULMONARY:  Unlabored, regular breathing. Clear bilaterally A&P w/o wheezes/rales/rhonchi. No accessory muscle use.  GI: BS present and normoactive. Soft, non-tender to palpation. No organomegaly or masses detected.  MSK: No erythema, warmth or tenderness. Cap refil <2 sec all extrem. No deformities or joint swelling noted.  Neuro: A/Ox3. No focal deficits noted.   Skin: Warm, no lesions or rashe Psych: Normal affect and behavior. Judgement and thought content appropriate.     Lab Results:  CBC    Component Value Date/Time   WBC 6.5 06/26/2022 1328   WBC 7.8 05/21/2022 0635   RBC 3.88 (L) 06/26/2022 1328   HGB 13.9 01/18/2023 1137   HGB 12.3 (L) 06/26/2022 1328   HGB 13.1 11/17/2020 1450   HCT 41.0 01/18/2023 1137   HCT 38.7 11/17/2020 1450   PLT 179 06/26/2022 1328   PLT 184 11/17/2020 1450   MCV 94.3 06/26/2022 1328   MCV 92 11/17/2020 1450   MCH 31.7 06/26/2022 1328   MCHC 33.6 06/26/2022 1328   RDW 14.4 06/26/2022 1328   RDW 13.7 11/17/2020 1450   LYMPHSABS 1.3 06/26/2022 1328   MONOABS 0.6 06/26/2022 1328   EOSABS 0.2 06/26/2022 1328   BASOSABS 0.1 06/26/2022 1328    BMET    Component Value Date/Time   NA 138 01/18/2023 1137   NA 139 11/29/2020 0803   K 5.0 01/18/2023 1137   CL 102 01/18/2023 1137   CO2 26 06/26/2022 1328   GLUCOSE 86 01/18/2023 1137   BUN 21 01/18/2023 1137   BUN 20 11/29/2020 0803   CREATININE 1.60 (H) 01/18/2023 1137   CREATININE 1.26 (H) 06/26/2022 1328   CALCIUM 9.3 06/26/2022 1328   GFRNONAA 60 (L) 06/26/2022 1328   GFRAA 98 05/18/2020 1433    BNP No results found for: "BNP"   Imaging:  No results found.  Administration History     None          Latest Ref Rng & Units 10/11/2023   12:44 PM 04/27/2022    8:55 AM  PFT Results  FVC-Pre L 3.41  P  3.22   FVC-Predicted Pre % 72  P 67   FVC-Post L 3.45  P 3.40   FVC-Predicted Post % 73  P 70   Pre FEV1/FVC % % 69  P 66   Post FEV1/FCV % % 70  P 66  FEV1-Pre L 2.35  P 2.13   FEV1-Predicted Pre % 68  P 60   FEV1-Post L 2.40  P 2.26   DLCO uncorrected ml/min/mmHg 18.00  P 19.62   DLCO UNC% % 65  P 70   DLCO corrected ml/min/mmHg 18.00  P 20.41   DLCO COR %Predicted % 65  P 73   DLVA Predicted % 76  P 76   TLC L 6.94  P 8.29   TLC % Predicted % 90  P 108   RV % Predicted % 114  P 178     P Preliminary result    No results found for: "NITRICOXIDE"      Assessment & Plan:   COPD, moderate (HCC) Moderate COPD.  Relatively low symptom burden.  Suspect he would be able to operate the interlock system but may need reduced settings.  Completed his form today.  [facility] the [facility].  Appears compensated on current regimen with Stiolto and Asmanex.  He would like to continue his pulmonary management at the [place].  He will reach out to [place] if he needs anything otherwise can follow-up as needed.  Action plan in place.  Encouraged to remain active.  Smoking cessation strongly advised.  Patient Instructions  Continue COPD regimen as prescribed by the [facility] - Stiolto 2 puffs daily, Asmanex 2 puffs daily. Brush tongue and rinse mouth afterwards Continue Albuterol inhaler 2 puffs neb every 6 hours as needed for shortness of breath or wheezing. Notify if symptoms persist despite rescue inhaler/neb use.   Follow up with Dr. Dorris Fetch as scheduled  Lung function testing showed moderate COPD   Follow up with [place] as needed. You can continue with the [facility] otherwise    Adenocarcinoma of left lung [facility]) Status post left upper lobectomy September 2023.  [facility] as well as Dr. Dorris Fetch.  No evidence of recurrence.   Advised if symptoms do not improve or worsen, to please contact office for sooner follow up or seek  emergency care.   I spent 32 minutes of dedicated to the care of this patient on the date of this encounter to include pre-visit review of records, face-to-face time with the patient discussing conditions above, post visit ordering of testing, clinical documentation with the electronic health record, making appropriate referrals as documented, and communicating necessary findings to members of the patients care team.  Noemi Chapel, NP 10/11/2023  Pt aware and understands NP's role.

## 2023-10-11 NOTE — Assessment & Plan Note (Signed)
Status post left upper lobectomy September 2023.  [facility] for [facility] surveillance as well as Dr. Dorris Fetch.  No evidence of recurrence.

## 2023-10-15 ENCOUNTER — Other Ambulatory Visit: Payer: Self-pay | Admitting: Thoracic Surgery (Cardiothoracic Vascular Surgery)

## 2023-10-15 DIAGNOSIS — C3490 Malignant neoplasm of unspecified part of unspecified bronchus or lung: Secondary | ICD-10-CM

## 2023-10-16 ENCOUNTER — Ambulatory Visit
Admission: RE | Admit: 2023-10-16 | Discharge: 2023-10-16 | Disposition: A | Discharge status: Home / Self Care | Payer: Medicare Other | Source: Ambulatory Visit | Attending: Thoracic Surgery (Cardiothoracic Vascular Surgery) | Admitting: Thoracic Surgery (Cardiothoracic Vascular Surgery)

## 2023-10-16 ENCOUNTER — Encounter: Payer: Self-pay | Admitting: Thoracic Surgery (Cardiothoracic Vascular Surgery)

## 2023-10-16 ENCOUNTER — Ambulatory Visit (INDEPENDENT_AMBULATORY_CARE_PROVIDER_SITE_OTHER): Payer: No Typology Code available for payment source | Admitting: Thoracic Surgery (Cardiothoracic Vascular Surgery)

## 2023-10-16 VITALS — BP 128/69 | HR 67 | Resp 20 | Ht 73.0 in | Wt 219.0 lb

## 2023-10-16 DIAGNOSIS — Z9889 Other specified postprocedural states: Secondary | ICD-10-CM | POA: Diagnosis not present

## 2023-10-16 DIAGNOSIS — Z902 Acquired absence of lung [part of]: Secondary | ICD-10-CM

## 2023-10-16 DIAGNOSIS — C3490 Malignant neoplasm of unspecified part of unspecified bronchus or lung: Secondary | ICD-10-CM

## 2023-10-16 NOTE — Progress Notes (Signed)
 [address]       [place] [address]             [phone]      HPI: Samuel Ferrell returns for a scheduled follow-up after prior left upper lobectomy.  Samuel Ferrell is a 78 year old man with a history of tobacco abuse, COPD, stage Ia adenocarcinoma of the lung, hypertension, hyperlipidemia, atrial fibrillation and flutter, TIA, anemia, reflux, melanoma, and ethanol abuse.  He had a left upper lobectomy on 05/19/2022.  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  I last saw him in the office in July 2024.  His voice had improved significantly and endoscopy at the [facility] showed no evidence of vocal cord paralysis.  He refused speech therapy.  He was still on Lyrica .  In the interim since that visit he is not having pain.  He is no longer taking Lyrica .  Still has problems with his voice from time to time.  Has started a pulmonary rehab program at the [facility].  Past Medical History:  Diagnosis Date   Afib [facility])    s/p ablation x2 by Dr Epifanio at [facility] in 2007, 2009   Alcohol abuse    Anemia    Atypical atrial flutter (HCC)    COPD (chronic obstructive pulmonary disease) (HCC)    COVID    mild case   CVA (cerebral infarction)    GERD (gastroesophageal reflux disease)    Heart murmur    Hyperlipidemia    Hypertension    Palpitations    Pneumonia    1970's   Skin cancer--melanoma    Stroke (HCC)    TIA     Current Outpatient Medications  Medication Sig Dispense Refill   apixaban  (ELIQUIS ) 5 MG TABS tablet Take 5 mg by mouth 2 (two) times daily.     aspirin  EC 81 MG tablet Take 81 mg by mouth daily. Swallow whole.     atorvastatin  (LIPITOR ) 40 MG tablet Take 40 mg by mouth every evening.      dofetilide  (TIKOSYN ) 500 MCG capsule Take 1 capsule (500 mcg total) by mouth 2 (two) times daily. 180 capsule 2   enalapril  (VASOTEC ) 10 MG tablet Take 1 tablet (10 mg total) by mouth at bedtime. 90 tablet 3   ferrous sulfate  325 (65 FE) MG  tablet Take 325 mg by mouth daily with breakfast. 250mg  per patient     Magnesium  250 MG TABS Take 250 mg by mouth every evening.     metoprolol  tartrate (LOPRESSOR ) 50 MG tablet Take 1.5 tablets (75 mg total) by mouth 2 (two) times daily. 270 tablet 3   Multiple Vitamin (MULTIVITAMIN) tablet Take 1 tablet by mouth daily. Centrum 50+     omeprazole (PRILOSEC) 20 MG capsule Take 20 mg by mouth daily.     potassium chloride  SA (KLOR-CON  M20) 20 MEQ tablet Take 1 tablet (20 mEq total) by mouth daily. 90 tablet 2   Tiotropium Bromide-Olodaterol (STIOLTO RESPIMAT ) 2.5-2.5 MCG/ACT AERS Inhale 2 puffs into the lungs daily. (Patient taking differently: Inhale 1 puff into the lungs 2 (two) times daily.) 4 g 11   vitamin C (ASCORBIC ACID) 250 MG tablet Take 250 mg by mouth daily.     No current facility-administered medications for this visit.    Physical Exam BP 128/69   Pulse 67   Resp 20   Ht 6' 1 (1.854 m)   Wt 219 lb (99.3 kg)   SpO2 96%  Comment: RA  BMI 28.56 kg/m  78 year old man in no acute distress Alert and oriented x 3 with no focal motor deficits Voice intermittently high-pitched Lungs diminished at left base otherwise clear Incisions well-healed No cervical or supraclavicular adenopathy  Diagnostic Tests: CHEST - 2 VIEW   COMPARISON:  Chest radiograph 04/10/2023 and earlier.   FINDINGS: Stable lung volumes, chronically reduced on the left. Stable cardiac size and mediastinal contours. Stable trachea. No pneumothorax, acute lung opacity. Calcified aortic atherosclerosis. No acute osseous abnormality identified. Chronic cervical ACDF. Negative visible bowel gas.   IMPRESSION: 1. Stable post treatment appearance of the chest. No acute cardiopulmonary abnormality. 2. Aortic Atherosclerosis (ICD10-I70.0).     Electronically Signed   By: VEAR Hurst M.D.   On: 10/16/2023 09:03 I personally reviewed the chest x-ray images.  There is volume loss on the left side due to his  prior surgery.  No evidence of recurrent disease.  Impression: Samuel Ferrell is a 78 year old man with a history of tobacco abuse, COPD, stage Ia adenocarcinoma of the lung, hypertension, hyperlipidemia, atrial fibrillation and flutter, TIA, anemia, reflux, melanoma, and ethanol abuse.  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  Not having any pain issues at this point.  Still has some voice issues, but improved.  Currently undergoing pulmonary rehab at the [facility].  Plan: Follow-up with pulmonology and oncology at the [facility] I will be happy to see Samuel Ferrell back anytime in the future if I can be of any further assistance with his care  Elspeth JAYSON Millers, MD [facility] [phone]

## 2023-10-29 ENCOUNTER — Telehealth (HOSPITAL_COMMUNITY): Payer: Self-pay

## 2023-10-29 NOTE — Telephone Encounter (Signed)
Spoke to veteran about transportation being set up through the [place] for Pulmonary Rehab. Veteran stated he still hasn't heard anything. [facility].

## 2023-11-02 ENCOUNTER — Telehealth (HOSPITAL_COMMUNITY): Payer: Self-pay

## 2023-11-02 NOTE — Telephone Encounter (Signed)
Called Samuel Ferrell to check on his transportation. Samuel Ferrell does not have transportation set up. Pt stated he has been working with a Child psychotherapist at the [facility]. Samuel Ferrell has the department number if he needs to call us.

## 2023-11-14 ENCOUNTER — Other Ambulatory Visit: Payer: Self-pay

## 2023-11-14 ENCOUNTER — Inpatient Hospital Stay (HOSPITAL_COMMUNITY)
Admission: EM | Admit: 2023-11-14 | Discharge: 2023-11-21 | DRG: 330 | Disposition: A | Discharge status: Home / Self Care | Attending: General Surgery | Admitting: General Surgery

## 2023-11-14 ENCOUNTER — Emergency Department (HOSPITAL_COMMUNITY)

## 2023-11-14 ENCOUNTER — Encounter (HOSPITAL_COMMUNITY): Payer: Self-pay

## 2023-11-14 DIAGNOSIS — E782 Mixed hyperlipidemia: Secondary | ICD-10-CM | POA: Insufficient documentation

## 2023-11-14 DIAGNOSIS — N179 Acute kidney failure, unspecified: Secondary | ICD-10-CM | POA: Insufficient documentation

## 2023-11-14 DIAGNOSIS — N401 Enlarged prostate with lower urinary tract symptoms: Secondary | ICD-10-CM | POA: Diagnosis present

## 2023-11-14 DIAGNOSIS — I959 Hypotension, unspecified: Secondary | ICD-10-CM | POA: Diagnosis present

## 2023-11-14 DIAGNOSIS — J449 Chronic obstructive pulmonary disease, unspecified: Secondary | ICD-10-CM | POA: Diagnosis present

## 2023-11-14 DIAGNOSIS — K567 Ileus, unspecified: Secondary | ICD-10-CM | POA: Diagnosis not present

## 2023-11-14 DIAGNOSIS — F17210 Nicotine dependence, cigarettes, uncomplicated: Secondary | ICD-10-CM | POA: Diagnosis present

## 2023-11-14 DIAGNOSIS — Z85118 Personal history of other malignant neoplasm of bronchus and lung: Secondary | ICD-10-CM

## 2023-11-14 DIAGNOSIS — R109 Unspecified abdominal pain: Secondary | ICD-10-CM | POA: Diagnosis not present

## 2023-11-14 DIAGNOSIS — K562 Volvulus: Secondary | ICD-10-CM | POA: Diagnosis not present

## 2023-11-14 DIAGNOSIS — K219 Gastro-esophageal reflux disease without esophagitis: Secondary | ICD-10-CM | POA: Diagnosis present

## 2023-11-14 DIAGNOSIS — I480 Paroxysmal atrial fibrillation: Secondary | ICD-10-CM | POA: Diagnosis present

## 2023-11-14 DIAGNOSIS — Z7901 Long term (current) use of anticoagulants: Secondary | ICD-10-CM

## 2023-11-14 DIAGNOSIS — Z8673 Personal history of transient ischemic attack (TIA), and cerebral infarction without residual deficits: Secondary | ICD-10-CM

## 2023-11-14 DIAGNOSIS — R000 Tachycardia, unspecified: Secondary | ICD-10-CM | POA: Diagnosis present

## 2023-11-14 DIAGNOSIS — K56609 Unspecified intestinal obstruction, unspecified as to partial versus complete obstruction: Secondary | ICD-10-CM | POA: Diagnosis present

## 2023-11-14 DIAGNOSIS — Z801 Family history of malignant neoplasm of trachea, bronchus and lung: Secondary | ICD-10-CM

## 2023-11-14 DIAGNOSIS — E860 Dehydration: Secondary | ICD-10-CM | POA: Diagnosis present

## 2023-11-14 DIAGNOSIS — I482 Chronic atrial fibrillation, unspecified: Secondary | ICD-10-CM | POA: Insufficient documentation

## 2023-11-14 DIAGNOSIS — Z79899 Other long term (current) drug therapy: Secondary | ICD-10-CM

## 2023-11-14 DIAGNOSIS — R338 Other retention of urine: Secondary | ICD-10-CM | POA: Diagnosis present

## 2023-11-14 DIAGNOSIS — Z7982 Long term (current) use of aspirin: Secondary | ICD-10-CM

## 2023-11-14 DIAGNOSIS — I10 Essential (primary) hypertension: Secondary | ICD-10-CM | POA: Insufficient documentation

## 2023-11-14 DIAGNOSIS — Z85820 Personal history of malignant melanoma of skin: Secondary | ICD-10-CM

## 2023-11-14 LAB — CBC WITH DIFFERENTIAL/PLATELET
Abs Immature Granulocytes: 0.04 10*3/uL (ref 0.00–0.07)
Basophils Absolute: 0 10*3/uL (ref 0.0–0.1)
Basophils Relative: 0 %
Eosinophils Absolute: 0.1 10*3/uL (ref 0.0–0.5)
Eosinophils Relative: 1 %
HCT: 38.7 % — ABNORMAL LOW (ref 39.0–52.0)
Hemoglobin: 13.1 g/dL (ref 13.0–17.0)
Immature Granulocytes: 0 %
Lymphocytes Relative: 11 %
Lymphs Abs: 1.3 10*3/uL (ref 0.7–4.0)
MCH: 32.4 pg (ref 26.0–34.0)
MCHC: 33.9 g/dL (ref 30.0–36.0)
MCV: 95.8 fL (ref 80.0–100.0)
Monocytes Absolute: 0.7 10*3/uL (ref 0.1–1.0)
Monocytes Relative: 6 %
Neutro Abs: 8.9 10*3/uL — ABNORMAL HIGH (ref 1.7–7.7)
Neutrophils Relative %: 82 %
Platelets: 216 10*3/uL (ref 150–400)
RBC: 4.04 MIL/uL — ABNORMAL LOW (ref 4.22–5.81)
RDW: 13.8 % (ref 11.5–15.5)
WBC: 11 10*3/uL — ABNORMAL HIGH (ref 4.0–10.5)
nRBC: 0 % (ref 0.0–0.2)

## 2023-11-14 LAB — COMPREHENSIVE METABOLIC PANEL
ALT: 75 U/L — ABNORMAL HIGH (ref 0–44)
AST: 39 U/L (ref 15–41)
Albumin: 3.9 g/dL (ref 3.5–5.0)
Alkaline Phosphatase: 87 U/L (ref 38–126)
Anion gap: 12 (ref 5–15)
BUN: 22 mg/dL (ref 8–23)
CO2: 19 mmol/L — ABNORMAL LOW (ref 22–32)
Calcium: 9 mg/dL (ref 8.9–10.3)
Chloride: 102 mmol/L (ref 98–111)
Creatinine, Ser: 1.63 mg/dL — ABNORMAL HIGH (ref 0.61–1.24)
GFR, Estimated: 43 mL/min — ABNORMAL LOW (ref >60)
Glucose, Bld: 129 mg/dL — ABNORMAL HIGH (ref 70–99)
Potassium: 4.5 mmol/L (ref 3.5–5.1)
Sodium: 133 mmol/L — ABNORMAL LOW (ref 135–145)
Total Bilirubin: 0.6 mg/dL (ref 0.0–1.2)
Total Protein: 7.3 g/dL (ref 6.5–8.1)

## 2023-11-14 LAB — PROTIME-INR
INR: 1.2 (ref 0.8–1.2)
Prothrombin Time: 15.4 s — ABNORMAL HIGH (ref 11.4–15.2)

## 2023-11-14 LAB — LACTIC ACID, PLASMA: Lactic Acid, Venous: 1.5 mmol/L (ref 0.5–1.9)

## 2023-11-14 LAB — APTT: aPTT: 26 s (ref 24–36)

## 2023-11-14 MED ORDER — MORPHINE SULFATE (PF) 4 MG/ML IV SOLN
4.0000 mg | Freq: Once | INTRAVENOUS | Status: AC
  Administered 2023-11-14: 4 mg via INTRAVENOUS
  Filled 2023-11-14: qty 1

## 2023-11-14 MED ORDER — ONDANSETRON HCL 4 MG/2ML IJ SOLN
4.0000 mg | Freq: Once | INTRAMUSCULAR | Status: AC
  Administered 2023-11-14: 4 mg via INTRAVENOUS
  Filled 2023-11-14: qty 2

## 2023-11-14 MED ORDER — LACTATED RINGERS IV BOLUS (SEPSIS)
1000.0000 mL | Freq: Once | INTRAVENOUS | Status: AC
  Administered 2023-11-14: 1000 mL via INTRAVENOUS

## 2023-11-14 MED ORDER — IOHEXOL 300 MG/ML  SOLN
80.0000 mL | Freq: Once | INTRAMUSCULAR | Status: AC | PRN
  Administered 2023-11-14: 80 mL via INTRAVENOUS

## 2023-11-14 NOTE — ED Triage Notes (Addendum)
 Pt bib EMS from home with c/o lower abd pain that started this afternoon, pt hasn't urinated since 1 am yesterday, pt started this evening. Pt says he gets dizzy when the pain hits. Pt endorsees drinking 2 beers prior to arrival.  CBG- 147 with EMS. Pt has hx of lung ca with partial left lung removal and hx of COPD.

## 2023-11-14 NOTE — ED Provider Notes (Signed)
 [place] EMERGENCY DEPARTMENT AT [facility] Provider Note   CSN: [id number] Arrival date & time: 11/14/23  1931     History  Chief Complaint  Patient presents with   Abdominal Pain    Samuel Ferrell is a 78 y.o. male.  78 year old male with past medical history of atrial fibrillation on Eliquis as well as hyperlipidemia and history of lung cancer in remission presenting to the emergency department today with abdominal pain.  The patient states he started with abdominal pain for 5 hours prior to arrival.  The patient is having some nausea but denies any vomiting.  Denies any diarrhea.  He denies any blood in his stool or dark stools.  Denies any fevers.  He states the pain is severe.  He states that he did drink a nonalcoholic beer earlier today but has not had any alcohol in years.  He was initially hypotensive at triage in the 70s systolic.  On my assessment the room his blood pressures improved to the 90s systolic.   Abdominal Pain Associated symptoms: nausea        Home Medications Prior to Admission medications   Medication Sig Start Date End Date Taking? Authorizing Provider  apixaban (ELIQUIS) 5 MG TABS tablet Take 5 mg by mouth 2 (two) times daily.    [provider]  aspirin EC 81 MG tablet Take 81 mg by mouth daily. Swallow whole.    [provider]  atorvastatin (LIPITOR) 40 MG tablet Take 40 mg by mouth every evening.  01/26/14   [provider]  dofetilide (TIKOSYN) 500 MCG capsule Take 1 capsule (500 mcg total) by mouth 2 (two) times daily. 11/01/20   Allred, Fayrene Fearing, MD  enalapril (VASOTEC) 10 MG tablet Take 1 tablet (10 mg total) by mouth at bedtime. 03/07/21   Allred, Fayrene Fearing, MD  ferrous sulfate 325 (65 FE) MG tablet Take 325 mg by mouth daily with breakfast. 250mg  per patient    [provider]  Magnesium 250 MG TABS Take 250 mg by mouth every evening.    [provider]  metoprolol tartrate (LOPRESSOR) 50 MG tablet  Take 1.5 tablets (75 mg total) by mouth 2 (two) times daily. 04/09/20   Newman Nip, NP  Multiple Vitamin (MULTIVITAMIN) tablet Take 1 tablet by mouth daily. Centrum 50+    [provider]  omeprazole (PRILOSEC) 20 MG capsule Take 20 mg by mouth daily. 12/01/13   [provider]  potassium chloride SA (KLOR-CON M20) 20 MEQ tablet Take 1 tablet (20 mEq total) by mouth daily. 10/07/20   Allred, Fayrene Fearing, MD  Tiotropium Bromide-Olodaterol (STIOLTO RESPIMAT) 2.5-2.5 MCG/ACT AERS Inhale 2 puffs into the lungs daily. Patient taking differently: Inhale 1 puff into the lungs 2 (two) times daily. 04/14/22   Omar Person, MD  vitamin C (ASCORBIC ACID) 250 MG tablet Take 250 mg by mouth daily.    [provider]      Allergies    Patient has no known allergies.    Review of Systems   Review of Systems  Gastrointestinal:  Positive for abdominal pain and nausea.  All other systems reviewed and are negative.   Physical Exam Updated Vital Signs BP 115/61   Pulse 71   Temp 97.8 F (36.6 C) (Oral)   Resp 19   Ht 6\' 1"  (1.854 m)   Wt 99.3 kg   SpO2 95%   BMI 28.88 kg/m  Physical Exam Vitals and nursing note reviewed.  Gen: Appears uncomfortable Eyes: PERRL, EOMI HEENT: no oropharyngeal swelling Neck: trachea midline Resp: clear to auscultation bilaterally Card: RRR, no murmurs, rubs, or gallops Abd: Patient's abdomen is distended with diffuse tenderness.  Maximal tenderness over the periumbilical region Extremities: no calf tenderness, no edema Vascular: 2+ radial pulses bilaterally, 2+ DP pulses bilaterally Skin: no rashes Psyc: acting appropriately   ED Results / Procedures / Treatments   Labs (all labs ordered are listed, but only abnormal results are displayed) Labs Reviewed  COMPREHENSIVE METABOLIC PANEL - Abnormal; Notable for the following components:      Result Value   Sodium 133 (*)    CO2 19 (*)    Glucose, Bld 129 (*)    Creatinine, Ser  1.63 (*)    ALT 75 (*)    GFR, Estimated 43 (*)    All other components within normal limits  CBC WITH DIFFERENTIAL/PLATELET - Abnormal; Notable for the following components:   WBC 11.0 (*)    RBC 4.04 (*)    HCT 38.7 (*)    Neutro Abs 8.9 (*)    All other components within normal limits  PROTIME-INR - Abnormal; Notable for the following components:   Prothrombin Time 15.4 (*)    All other components within normal limits  CULTURE, BLOOD (ROUTINE X 2)  CULTURE, BLOOD (ROUTINE X 2)  LACTIC ACID, PLASMA  APTT  URINALYSIS, W/ REFLEX TO CULTURE (INFECTION SUSPECTED)    EKG None  Radiology No results found.  Procedures Procedures    Medications Ordered in ED Medications  lactated ringers bolus 1,000 mL (0 mLs Intravenous Stopped 11/14/23 2202)  iohexol (OMNIPAQUE) 300 MG/ML solution 80 mL (80 mLs Intravenous Contrast Given 11/14/23 2130)  morphine (PF) 4 MG/ML injection 4 mg (4 mg Intravenous Given 11/14/23 2218)  ondansetron (ZOFRAN) injection 4 mg (4 mg Intravenous Given 11/14/23 2218)    ED Course/ Medical Decision Making/ A&P                                 Medical Decision Making 78 year old male presents the emergency department today with abdominal pain.  I will further evaluate him here with basic labs including LFTs and a lipase to evaluate for hepatobiliary pathology or pancreatitis.  I will also obtain a CT scan of his abdomen to evaluate for appendicitis, diverticulitis, colitis, perforated viscus, or other other intra-abdominal pathology.  Will also obtain a lactic acid to screen for sepsis given the patient's low blood pressure on arrival.  This has improved so this very well may have been due to a vasovagal episode.  I will treat the patient's pain when his blood pressure improves.  Will reevaluate for ultimate disposition.  The patient's initial labs are largely unremarkable.  His blood pressure did improve and patient was treated with morphine and Zofran for symptoms.   CT imaging is pending at the time of signout.  Plan is for reevaluation for ultimate disposition.  Amount and/or Complexity of Data Reviewed Labs: ordered. Radiology: ordered.  Risk Prescription drug management.           Final Clinical Impression(s) / ED Diagnoses Final diagnoses:  Abdominal pain, unspecified abdominal location    Rx / DC Orders ED Discharge Orders     None         Durwin Glaze, MD 11/14/23 2335

## 2023-11-15 ENCOUNTER — Inpatient Hospital Stay (HOSPITAL_COMMUNITY)

## 2023-11-15 ENCOUNTER — Emergency Department (HOSPITAL_COMMUNITY)

## 2023-11-15 DIAGNOSIS — K567 Ileus, unspecified: Secondary | ICD-10-CM | POA: Diagnosis not present

## 2023-11-15 DIAGNOSIS — K219 Gastro-esophageal reflux disease without esophagitis: Secondary | ICD-10-CM | POA: Diagnosis present

## 2023-11-15 DIAGNOSIS — I959 Hypotension, unspecified: Secondary | ICD-10-CM | POA: Diagnosis present

## 2023-11-15 DIAGNOSIS — I482 Chronic atrial fibrillation, unspecified: Secondary | ICD-10-CM | POA: Insufficient documentation

## 2023-11-15 DIAGNOSIS — K56609 Unspecified intestinal obstruction, unspecified as to partial versus complete obstruction: Secondary | ICD-10-CM | POA: Diagnosis not present

## 2023-11-15 DIAGNOSIS — I10 Essential (primary) hypertension: Secondary | ICD-10-CM | POA: Insufficient documentation

## 2023-11-15 DIAGNOSIS — R338 Other retention of urine: Secondary | ICD-10-CM | POA: Diagnosis present

## 2023-11-15 DIAGNOSIS — K562 Volvulus: Secondary | ICD-10-CM | POA: Diagnosis present

## 2023-11-15 DIAGNOSIS — Z7901 Long term (current) use of anticoagulants: Secondary | ICD-10-CM | POA: Diagnosis not present

## 2023-11-15 DIAGNOSIS — N179 Acute kidney failure, unspecified: Secondary | ICD-10-CM | POA: Diagnosis present

## 2023-11-15 DIAGNOSIS — E782 Mixed hyperlipidemia: Secondary | ICD-10-CM | POA: Diagnosis present

## 2023-11-15 DIAGNOSIS — N401 Enlarged prostate with lower urinary tract symptoms: Secondary | ICD-10-CM | POA: Diagnosis present

## 2023-11-15 DIAGNOSIS — R109 Unspecified abdominal pain: Secondary | ICD-10-CM | POA: Diagnosis present

## 2023-11-15 DIAGNOSIS — R000 Tachycardia, unspecified: Secondary | ICD-10-CM | POA: Diagnosis present

## 2023-11-15 DIAGNOSIS — Z7982 Long term (current) use of aspirin: Secondary | ICD-10-CM | POA: Diagnosis not present

## 2023-11-15 DIAGNOSIS — I480 Paroxysmal atrial fibrillation: Secondary | ICD-10-CM | POA: Diagnosis present

## 2023-11-15 DIAGNOSIS — F17210 Nicotine dependence, cigarettes, uncomplicated: Secondary | ICD-10-CM | POA: Diagnosis present

## 2023-11-15 DIAGNOSIS — Z85118 Personal history of other malignant neoplasm of bronchus and lung: Secondary | ICD-10-CM | POA: Diagnosis not present

## 2023-11-15 DIAGNOSIS — Z8673 Personal history of transient ischemic attack (TIA), and cerebral infarction without residual deficits: Secondary | ICD-10-CM | POA: Diagnosis not present

## 2023-11-15 DIAGNOSIS — E860 Dehydration: Secondary | ICD-10-CM | POA: Diagnosis present

## 2023-11-15 DIAGNOSIS — J449 Chronic obstructive pulmonary disease, unspecified: Secondary | ICD-10-CM | POA: Diagnosis present

## 2023-11-15 DIAGNOSIS — Z85820 Personal history of malignant melanoma of skin: Secondary | ICD-10-CM | POA: Diagnosis not present

## 2023-11-15 DIAGNOSIS — Z801 Family history of malignant neoplasm of trachea, bronchus and lung: Secondary | ICD-10-CM | POA: Diagnosis not present

## 2023-11-15 DIAGNOSIS — Z79899 Other long term (current) drug therapy: Secondary | ICD-10-CM | POA: Diagnosis not present

## 2023-11-15 LAB — CBC
HCT: 41.7 % (ref 39.0–52.0)
Hemoglobin: 14.2 g/dL (ref 13.0–17.0)
MCH: 32 pg (ref 26.0–34.0)
MCHC: 34.1 g/dL (ref 30.0–36.0)
MCV: 93.9 fL (ref 80.0–100.0)
Platelets: 216 10*3/uL (ref 150–400)
RBC: 4.44 MIL/uL (ref 4.22–5.81)
RDW: 13.9 % (ref 11.5–15.5)
WBC: 8.6 10*3/uL (ref 4.0–10.5)
nRBC: 0 % (ref 0.0–0.2)

## 2023-11-15 LAB — COMPREHENSIVE METABOLIC PANEL
ALT: 72 U/L — ABNORMAL HIGH (ref 0–44)
AST: 43 U/L — ABNORMAL HIGH (ref 15–41)
Albumin: 4.1 g/dL (ref 3.5–5.0)
Alkaline Phosphatase: 96 U/L (ref 38–126)
Anion gap: 12 (ref 5–15)
BUN: 23 mg/dL (ref 8–23)
CO2: 21 mmol/L — ABNORMAL LOW (ref 22–32)
Calcium: 9.3 mg/dL (ref 8.9–10.3)
Chloride: 101 mmol/L (ref 98–111)
Creatinine, Ser: 1.64 mg/dL — ABNORMAL HIGH (ref 0.61–1.24)
GFR, Estimated: 43 mL/min — ABNORMAL LOW (ref >60)
Glucose, Bld: 115 mg/dL — ABNORMAL HIGH (ref 70–99)
Potassium: 4.6 mmol/L (ref 3.5–5.1)
Sodium: 134 mmol/L — ABNORMAL LOW (ref 135–145)
Total Bilirubin: 0.9 mg/dL (ref 0.0–1.2)
Total Protein: 7.8 g/dL (ref 6.5–8.1)

## 2023-11-15 LAB — URINALYSIS, W/ REFLEX TO CULTURE (INFECTION SUSPECTED)
Bilirubin Urine: NEGATIVE
Glucose, UA: NEGATIVE mg/dL
Ketones, ur: NEGATIVE mg/dL
Nitrite: POSITIVE — AB
Protein, ur: 30 mg/dL — AB
Specific Gravity, Urine: >1.046 — ABNORMAL HIGH (ref 1.005–1.030)
WBC, UA: >50 WBC/hpf (ref 0–5)
pH: 6 (ref 5.0–8.0)

## 2023-11-15 LAB — MAGNESIUM: Magnesium: 2.1 mg/dL (ref 1.7–2.4)

## 2023-11-15 LAB — PHOSPHORUS: Phosphorus: 5.4 mg/dL — ABNORMAL HIGH (ref 2.5–4.6)

## 2023-11-15 MED ORDER — SODIUM CHLORIDE 0.9 % IV SOLN: INTRAVENOUS | Status: DC

## 2023-11-15 MED ORDER — MIDAZOLAM HCL 5 MG/5ML IJ SOLN
2.0000 mg | Freq: Once | INTRAMUSCULAR | Status: AC
  Administered 2023-11-15: 2 mg via INTRAVENOUS
  Filled 2023-11-15: qty 5

## 2023-11-15 MED ORDER — METOPROLOL TARTRATE 5 MG/5ML IV SOLN
2.5000 mg | INTRAVENOUS | Status: DC
  Administered 2023-11-15 – 2023-11-18 (×14): 2.5 mg via INTRAVENOUS
  Filled 2023-11-15 (×16): qty 5

## 2023-11-15 MED ORDER — SODIUM CHLORIDE 0.9 % IV SOLN
2.0000 g | INTRAVENOUS | Status: AC
  Administered 2023-11-16: 2 g via INTRAVENOUS
  Filled 2023-11-15 (×2): qty 2

## 2023-11-15 MED ORDER — ONDANSETRON HCL 4 MG PO TABS: 4.0000 mg | ORAL_TABLET | Freq: Four times a day (QID) | ORAL | Status: DC | PRN

## 2023-11-15 MED ORDER — UMECLIDINIUM BROMIDE 62.5 MCG/ACT IN AEPB
1.0000 | INHALATION_SPRAY | Freq: Every day | RESPIRATORY_TRACT | Status: DC
  Administered 2023-11-17 – 2023-11-21 (×5): 1 via RESPIRATORY_TRACT
  Filled 2023-11-15: qty 7

## 2023-11-15 MED ORDER — ACETAMINOPHEN 650 MG RE SUPP
650.0000 mg | Freq: Four times a day (QID) | RECTAL | Status: DC | PRN
Start: 2023-11-15 — End: 2023-11-21

## 2023-11-15 MED ORDER — SODIUM CHLORIDE 0.9 % IV SOLN: INTRAVENOUS | Status: AC

## 2023-11-15 MED ORDER — ARFORMOTEROL TARTRATE 15 MCG/2ML IN NEBU
15.0000 ug | INHALATION_SOLUTION | Freq: Two times a day (BID) | RESPIRATORY_TRACT | Status: DC
  Administered 2023-11-15 – 2023-11-21 (×11): 15 ug via RESPIRATORY_TRACT
  Filled 2023-11-15 (×12): qty 2

## 2023-11-15 MED ORDER — CHLORHEXIDINE GLUCONATE CLOTH 2 % EX PADS: 6.0000 | MEDICATED_PAD | Freq: Once | CUTANEOUS | Status: DC

## 2023-11-15 MED ORDER — METOCLOPRAMIDE HCL 5 MG/ML IJ SOLN
5.0000 mg | Freq: Three times a day (TID) | INTRAMUSCULAR | Status: DC | PRN
  Administered 2023-11-15 – 2023-11-20 (×8): 5 mg via INTRAVENOUS
  Filled 2023-11-15 (×8): qty 2

## 2023-11-15 MED ORDER — HYDROMORPHONE HCL 1 MG/ML IJ SOLN
0.5000 mg | INTRAMUSCULAR | Status: DC | PRN
  Administered 2023-11-15 – 2023-11-16 (×4): 0.5 mg via INTRAVENOUS
  Filled 2023-11-15: qty 0.5
  Filled 2023-11-15: qty 1
  Filled 2023-11-15 (×3): qty 0.5

## 2023-11-15 MED ORDER — DOCUSATE SODIUM 50 MG/5ML PO LIQD
200.0000 mg | Freq: Every day | ORAL | Status: DC
Start: 2023-11-15 — End: 2023-11-18
  Administered 2023-11-15: 200 mg via ORAL
  Filled 2023-11-15 (×6): qty 20

## 2023-11-15 MED ORDER — METOPROLOL TARTRATE 5 MG/5ML IV SOLN
2.5000 mg | Freq: Four times a day (QID) | INTRAVENOUS | Status: DC
  Administered 2023-11-15: 2.5 mg via INTRAVENOUS

## 2023-11-15 MED ORDER — PHENOL 1.4 % MT LIQD
1.0000 | OROMUCOSAL | Status: DC | PRN
  Administered 2023-11-15: 1 via OROMUCOSAL
  Filled 2023-11-15 (×2): qty 177

## 2023-11-15 MED ORDER — CHLORPROMAZINE HCL 25 MG PO TABS
25.0000 mg | ORAL_TABLET | Freq: Four times a day (QID) | ORAL | Status: DC | PRN
Start: 2023-11-15 — End: 2023-11-15

## 2023-11-15 MED ORDER — MAGNESIUM SULFATE 2 GM/50ML IV SOLN
2.0000 g | Freq: Once | INTRAVENOUS | Status: AC
  Administered 2023-11-15: 2 g via INTRAVENOUS
  Filled 2023-11-15: qty 50

## 2023-11-15 MED ORDER — CHLORHEXIDINE GLUCONATE CLOTH 2 % EX PADS
6.0000 | MEDICATED_PAD | Freq: Once | CUTANEOUS | Status: AC
  Administered 2023-11-15: 6 via TOPICAL

## 2023-11-15 MED ORDER — SODIUM CHLORIDE 0.9 % IV SOLN
25.0000 mg | Freq: Four times a day (QID) | INTRAVENOUS | Status: DC | PRN
  Administered 2023-11-17 – 2023-11-19 (×5): 25 mg via INTRAVENOUS
  Filled 2023-11-15 (×4): qty 1

## 2023-11-15 MED ORDER — METOPROLOL TARTRATE 5 MG/5ML IV SOLN
INTRAVENOUS | Status: AC
  Filled 2023-11-15: qty 5

## 2023-11-15 MED ORDER — ACETAMINOPHEN 325 MG PO TABS: 650.0000 mg | ORAL_TABLET | Freq: Four times a day (QID) | ORAL | Status: DC | PRN

## 2023-11-15 MED ORDER — ONDANSETRON HCL 4 MG/2ML IJ SOLN
4.0000 mg | Freq: Four times a day (QID) | INTRAMUSCULAR | Status: DC | PRN
  Administered 2023-11-15 – 2023-11-18 (×7): 4 mg via INTRAVENOUS
  Filled 2023-11-15 (×7): qty 2

## 2023-11-15 NOTE — TOC CM/SW Note (Signed)
 Transition of Care [facility]) - Inpatient Brief Assessment   Patient Details  Name: Samuel Ferrell MRN: [id number] Date of Birth: Jan 26, 1946  Transition of [facility]) CM/SW Contact:    Beather Arbour Phone Number: 11/15/2023, 11:38 AM   Clinical Narrative:  CSW spoke with patient, patient agreeable to [facility]. The notification ID is  [id number].   Transition of Care Department [facility]) has reviewed patient and no TOC needs have been identified at this time. We will continue to monitor patient advancement through interdisciplinary progression rounds. If new patient transition needs arise, please place a TOC consult.   Transition of Care Asessment: Insurance and Status: Insurance coverage has been reviewed Patient has primary care physician: Yes Home environment has been reviewed: Single Family Home with significant other Prior level of function:: Independent Prior/Current Home Services: No current home services Social Drivers of Health Review: SDOH reviewed no interventions necessary Readmission risk has been reviewed: Yes Transition of care needs: no transition of care needs at this time

## 2023-11-15 NOTE — ED Notes (Signed)
 Pt hr 97 just as getting ready to push metoprolol.

## 2023-11-15 NOTE — ED Notes (Signed)
 Per Dr Henreitta Leber, NG tube pushed in 4 extra cm. Now at 64CM.

## 2023-11-15 NOTE — ED Notes (Signed)
 Pt ambulated down the hall with no assistance. Pt attempted to give a urine sample but was unable to.

## 2023-11-15 NOTE — H&P (Signed)
 History and Physical    Patient: Samuel Ferrell GMW:[id number] DOB: 1945/10/30 DOA: 11/14/2023 DOS: the patient was seen and examined on 11/15/2023 PCP: Tisovec, Adelfa Koh, MD  Patient coming from: Home  Chief Complaint:  Chief Complaint  Patient presents with   Abdominal Pain   HPI: Samuel Ferrell is a 78 y.o. male with medical history significant of hypertension, hyperlipidemia, COPD, A_Fib status post ablation x 2, tobacco abuse who presents to the emergency department due to Abdominal pain which started about 5 hours PTA, this was associated with nausea without vomiting.  Pain was diffuse and severe in nature.  He endorsed drinking 1 nonalcoholic beer earlier yesterday, but denies any history of alcohol use.  ED Course:  In the emergency department, BP was 70/40 on arrival to the ED, but other vital signs were within normal range.  Workup in the ED showed normal CBC except for WBC of 11.0, hematocrit 38.7.  BMP showed sodium 133, potassium 4.5, chloride 102, bicarb 19, blood glucose 129, BUN 22, creatinine 1.63 (baseline creatinine at 1.2 - 1.3), ALT 75.  Blood culture pending. CT abdomen and pelvis with contrast showed small bowel obstruction with an abrupt transition zone along an area of mesenteric twisting within the mid right abdomen. General surgery, Dr. Henreitta Leber was consulted and will follow-up on patient in the morning with plan to possibly do surgery on Friday (3/7). Hospitalist was asked to admit patient for further evaluation and management.   Review of Systems: Review of systems as noted in the HPI. All other systems reviewed and are negative.   Past Medical History:  Diagnosis Date   Afib [facility])    s/p ablation x2 by Dr Sampson Goon at [facility] in 2007, 2009   Alcohol abuse    Anemia    Atypical atrial flutter (HCC)    COPD (chronic obstructive pulmonary disease) (HCC)    COVID    mild case   CVA (cerebral infarction)    GERD (gastroesophageal reflux disease)    Heart  murmur    Hyperlipidemia    Hypertension    Palpitations    Pneumonia    1970's   Skin cancer--melanoma    Stroke [facility])    TIA   Past Surgical History:  Procedure Laterality Date   ABDOMINAL AORTOGRAM N/A 01/18/2023   Procedure: ABDOMINAL AORTOGRAM;  Surgeon: Cephus Shelling, MD;  Location: [facility] INVASIVE CV LAB;  Service: Cardiovascular;  Laterality: N/A;   ANTERIOR CERVICAL DECOMP/DISCECTOMY FUSION N/A 07/24/2018   Procedure: ANTERIOR CERVICAL DECOMPRESSION/DISCECTOMY FUSION, INTERBODY PROSTHESIS, PLATE SCREWS CERVICAL FIVE- CERVICAL SIX;  Surgeon: Tressie Stalker, MD;  Location: [facility] OR;  Service: Neurosurgery;  Laterality: N/A;  ANTERIOR CERVICAL DECOMPRESSION/DISCECTOMY FUSION, INTERBODY PROSTHESIS, PLATE SCREWS CERVICAL FIVE- CERVICAL SIX   APPENDECTOMY     ATRIAL FIBRILLATION ABLATION  2007 ,2009   Dr Sampson Goon at [facility]   BRONCHIAL BIOPSY  04/24/2022   Procedure: BRONCHIAL BIOPSIES;  Surgeon: Omar Person, MD;  Location: [facility] ENDOSCOPY;  Service: Pulmonary;;   BRONCHIAL BRUSHINGS  04/24/2022   Procedure: BRONCHIAL BRUSHINGS;  Surgeon: Omar Person, MD;  Location: [facility] ENDOSCOPY;  Service: Pulmonary;;   BRONCHIAL BRUSHINGS  05/19/2022   Procedure: BRONCHIAL BRUSHINGS;  Surgeon: Omar Person, MD;  Location: [facility] ENDOSCOPY;  Service: Pulmonary;;   BRONCHIAL NEEDLE ASPIRATION BIOPSY  04/24/2022   Procedure: BRONCHIAL NEEDLE ASPIRATION BIOPSIES;  Surgeon: Omar Person, MD;  Location: [facility] ENDOSCOPY;  Service: Pulmonary;;   BRONCHIAL NEEDLE ASPIRATION BIOPSY  05/19/2022   Procedure:  BRONCHIAL NEEDLE ASPIRATION BIOPSIES;  Surgeon: Omar Person, MD;  Location: [facility] ENDOSCOPY;  Service: Pulmonary;;   BRONCHIAL WASHINGS  04/24/2022   Procedure: BRONCHIAL WASHINGS;  Surgeon: Omar Person, MD;  Location: [facility] ENDOSCOPY;  Service: Pulmonary;;   EYE SURGERY Bilateral    cataracts   FIDUCIAL MARKER PLACEMENT  05/19/2022   Procedure: DYE MARKING;  Surgeon: Omar Person, MD;  Location: [facility] ENDOSCOPY;  Service: Pulmonary;;   FRACTURE SURGERY     right clavicle   HEMOSTASIS CONTROL  04/24/2022   Procedure: HEMOSTASIS CONTROL;  Surgeon: Omar Person, MD;  Location: [facility] ENDOSCOPY;  Service: Pulmonary;;   INTERCOSTAL NERVE BLOCK Left 05/19/2022   Procedure: INTERCOSTAL NERVE BLOCK;  Surgeon: Loreli Slot, MD;  Location: [facility] OR;  Service: Thoracic;  Laterality: Left;   LYMPH NODE DISSECTION Left 05/19/2022   Procedure: LYMPH NODE DISSECTION;  Surgeon: Loreli Slot, MD;  Location: [facility] OR;  Service: Thoracic;  Laterality: Left;   PERIPHERAL VASCULAR INTERVENTION Bilateral 01/05/2022   Procedure: PERIPHERAL VASCULAR INTERVENTION;  Surgeon: Cephus Shelling, MD;  Location: [facility];  Service: Cardiovascular;  Laterality: Bilateral;  Bilateral Renal Artery Stents   RENAL ANGIOGRAPHY Bilateral 01/05/2022   Procedure: RENAL ARTERY STENTING;  Surgeon: Cephus Shelling, MD;  Location: [facility];  Service: Cardiovascular;  Laterality: Bilateral;   RENAL ANGIOGRAPHY N/A 01/18/2023   Procedure: RENAL ANGIOGRAPHY;  Surgeon: Cephus Shelling, MD;  Location: [facility];  Service: Cardiovascular;  Laterality: N/A;   RENAL INTERVENTION  01/18/2023   Procedure: RENAL INTERVENTION;  Surgeon: Cephus Shelling, MD;  Location: [facility];  Service: Cardiovascular;;   TONSILLECTOMY     VIDEO BRONCHOSCOPY WITH RADIAL ENDOBRONCHIAL ULTRASOUND  04/24/2022   Procedure: VIDEO BRONCHOSCOPY WITH RADIAL ENDOBRONCHIAL ULTRASOUND;  Surgeon: Omar Person, MD;  Location: [facility] ENDOSCOPY;  Service: Pulmonary;;   VIDEO BRONCHOSCOPY WITH RADIAL ENDOBRONCHIAL ULTRASOUND  05/19/2022   Procedure: VIDEO BRONCHOSCOPY WITH RADIAL ENDOBRONCHIAL ULTRASOUND;  Surgeon: Omar Person, MD;  Location: [facility] ENDOSCOPY;  Service: Pulmonary;;    Social History:  reports that he has been smoking cigarettes. He has a 61 pack-year smoking  history. He has never used smokeless tobacco. He reports that he does not currently use alcohol. He reports that he does not use drugs.   No Known Allergies  Family History  Problem Relation Age of Onset   Lung cancer Brother        smoked   Cancer Other    Colon cancer Neg Hx    Esophageal cancer Neg Hx    Rectal cancer Neg Hx    Stomach cancer Neg Hx      Prior to Admission medications   Medication Sig Start Date End Date Taking? Authorizing Provider  apixaban (ELIQUIS) 5 MG TABS tablet Take 5 mg by mouth 2 (two) times daily.    [provider]  aspirin EC 81 MG tablet Take 81 mg by mouth daily. Swallow whole.    [provider]  atorvastatin (LIPITOR) 40 MG tablet Take 40 mg by mouth every evening.  01/26/14   [provider]  dofetilide (TIKOSYN) 500 MCG capsule Take 1 capsule (500 mcg total) by mouth 2 (two) times daily. 11/01/20   Allred, Fayrene Fearing, MD  enalapril (VASOTEC) 10 MG tablet Take 1 tablet (10 mg total) by mouth at bedtime. 03/07/21   Allred, Fayrene Fearing, MD  ferrous sulfate 325 (65 FE) MG tablet Take 325 mg  by mouth daily with breakfast. 250mg  per patient    [provider]  Magnesium 250 MG TABS Take 250 mg by mouth every evening.    [provider]  metoprolol tartrate (LOPRESSOR) 50 MG tablet Take 1.5 tablets (75 mg total) by mouth 2 (two) times daily. 04/09/20   Newman Nip, NP  Multiple Vitamin (MULTIVITAMIN) tablet Take 1 tablet by mouth daily. Centrum 50+    [provider]  omeprazole (PRILOSEC) 20 MG capsule Take 20 mg by mouth daily. 12/01/13   [provider]  potassium chloride SA (KLOR-CON M20) 20 MEQ tablet Take 1 tablet (20 mEq total) by mouth daily. 10/07/20   Allred, Fayrene Fearing, MD  Tiotropium Bromide-Olodaterol (STIOLTO RESPIMAT) 2.5-2.5 MCG/ACT AERS Inhale 2 puffs into the lungs daily. Patient taking differently: Inhale 1 puff into the lungs 2 (two) times daily. 04/14/22   Omar Person, MD  vitamin C  (ASCORBIC ACID) 250 MG tablet Take 250 mg by mouth daily.    [provider]    Physical Exam: BP 132/65   Pulse 81   Temp 97.8 F (36.6 C) (Oral)   Resp 19   Ht 6\' 1"  (1.854 m)   Wt 99.3 kg   SpO2 93%   BMI 28.88 kg/m   General: 78 y.o. year-old male well developed well nourished in no acute distress.  Alert and oriented x3. HEENT: NCAT, EOMI, patient with NG tube Neck: Supple, trachea medial Cardiovascular: Regular rate and rhythm with no rubs or gallops.  No thyromegaly or JVD noted.  No lower extremity edema. 2/4 pulses in all 4 extremities. Respiratory: Clear to auscultation with no wheezes or rales. Good inspiratory effort. Abdomen: Soft, mild tenderness with distention but without guarding. Muskuloskeletal: No cyanosis, clubbing or edema noted bilaterally Neuro: CN II-XII intact, strength 5/5 x 4, sensation, reflexes intact Skin: No ulcerative lesions noted or rashes Psychiatry: Judgement and insight appear normal. Mood is appropriate for condition and setting          Labs on Admission:  Basic Metabolic Panel: Recent Labs  Lab 11/14/23 2030  NA 133*  K 4.5  CL 102  CO2 19*  GLUCOSE 129*  BUN 22  CREATININE 1.63*  CALCIUM 9.0   Liver Function Tests: Recent Labs  Lab 11/14/23 2030  AST 39  ALT 75*  ALKPHOS 87  BILITOT 0.6  PROT 7.3  ALBUMIN 3.9   No results for input(s): "LIPASE", "AMYLASE" in the last 168 hours. No results for input(s): "AMMONIA" in the last 168 hours. CBC: Recent Labs  Lab 11/14/23 2030  WBC 11.0*  NEUTROABS 8.9*  HGB 13.1  HCT 38.7*  MCV 95.8  PLT 216   Cardiac Enzymes: No results for input(s): "CKTOTAL", "CKMB", "CKMBINDEX", "TROPONINI" in the last 168 hours.  BNP (last 3 results) No results for input(s): "BNP" in the last 8760 hours.  ProBNP (last 3 results) No results for input(s): "PROBNP" in the last 8760 hours.  CBG: No results for input(s): "GLUCAP" in the last 168 hours.  Radiological Exams on  Admission: DG Abd Portable 1 View Result Date: 11/15/2023 CLINICAL DATA:  NG tube EXAM: PORTABLE ABDOMEN - 1 VIEW COMPARISON:  CT abdomen and pelvis 11/14/2023 FINDINGS: Enteric tube tip in the stomach with side port likely in the distal esophagus. Recommend advancement 3-4 cm to ensure side port is in the stomach. Remainder unchanged from recent CT abdomen and pelvis. IMPRESSION: 1. Enteric tube tip in the stomach with side port likely in the  distal esophagus. Recommend advancement 3-4 cm to ensure side port is in the stomach. Electronically Signed   By: Minerva Fester M.D.   On: 11/15/2023 01:29   CT ABDOMEN PELVIS W CONTRAST Result Date: 11/14/2023 CLINICAL DATA:  Lower abdominal pain and dizziness. EXAM: CT ABDOMEN AND PELVIS WITH CONTRAST TECHNIQUE: Multidetector CT imaging of the abdomen and pelvis was performed using the standard protocol following bolus administration of intravenous contrast. RADIATION DOSE REDUCTION: This exam was performed according to the departmental dose-optimization program which includes automated exposure control, adjustment of the mA and/or kV according to patient size and/or use of iterative reconstruction technique. CONTRAST:  80mL OMNIPAQUE IOHEXOL 300 MG/ML  SOLN COMPARISON:  January 02, 2023 FINDINGS: Lower chest: There is mild, pleural base calcification within the bilateral lung bases. Hepatobiliary: Stable simple hepatic cysts are seen. No gallstones, gallbladder wall thickening, or biliary dilatation. Pancreas: Unremarkable. No pancreatic ductal dilatation or surrounding inflammatory changes. Spleen: Normal in size without focal abnormality. Adrenals/Urinary Tract: The right adrenal gland is unremarkable. 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 This area demonstrated no evidence of hypermetabolic activity on the prior PET CT (dated April 12, 2022). Kidneys are normal in size, without obstructing renal calculi or  hydronephrosis. Small bilateral simple renal cysts are present. Bladder is unremarkable. Stomach/Bowel: Stomach is within normal limits. The appendix is surgically absent. Numerous dilated loops of jejunum and ileum are seen throughout the abdomen and pelvis (maximum small bowel diameter of approximately 3.0 cm). An abrupt transition zone is seen along an area of mesenteric twisting within the mid right abdomen, within the region anterior to the right kidney (axial CT images 39 through 59, CT series 2). Vascular/Lymphatic: Aortic atherosclerosis. No enlarged abdominal or pelvic lymph nodes. Reproductive: Prostate is unremarkable. Other: No abdominal wall hernia or abnormality. No abdominopelvic ascites. Musculoskeletal: No acute or significant osseous findings. IMPRESSION: 1. Small bowel obstruction with an abrupt transition zone along an area of mesenteric twisting within the mid right abdomen. 2. Stable simple hepatic and bilateral renal cysts. 3. Stable left adrenal mass, as described above. 4. Aortic atherosclerosis. Aortic Atherosclerosis (ICD10-I70.0). Electronically Signed   By: Aram Candela M.D.   On: 11/14/2023 23:45    EKG: I independently viewed the EKG done and my findings are as followed: Normal sinus rhythm at a rate of 68 bpm with prolonged PR interval (279 ms)  Assessment/Plan Present on Admission:  Small bowel obstruction (HCC)  COPD (chronic obstructive pulmonary disease) (HCC)  Principal Problem:   Small bowel obstruction (HCC) Active Problems:   COPD (chronic obstructive pulmonary disease) (HCC)   AKI (acute kidney injury) (HCC)   Essential hypertension   Mixed hyperlipidemia   Atrial fibrillation, chronic (HCC)  Small bowel obstruction CT abdominal pelvis was suggestive of small bowel obstruction Continue NG tube  Continue NPO at this time  Continue IV hydration Continue IV Dilaudid 0.5 mg q.3h p.r.n. for moderate/severe pain Continue Zofran p.r.n. for  nausea/vomiting General Surgery (Dr. Henreitta Leber) consult was appreciated.  She will follow-up with patient in the morning with plan for possible surgery tomorrow (3/7) to permit for Eliquis washout.  Acute kidney injury Creatinine 1.63 (baseline creatinine at 1.2 - 1.3) Continue gentle hydration Renally adjust medications, avoid nephrotoxic agents/dehydration/hypotension  Essential hypertension Continue IV hydralazine 10 mg every 6 hours as needed for SBP > 170 Consider resuming home meds when patient resumes oral intake  Mixed hyperlipidemia Consider resuming home meds when patient resumes oral  intake  Chronic A-fib Continue IV metoprolol 2.5 mg every 6 hours as needed for HR > 110 Consider resuming home meds when patient resumes oral intake  COPD Continue Brovana and Incruse Ellipta  DVT prophylaxis: SCDs  Code Status: Full code  Family Communication: None at bedside  Consults: General Surgery  Severity of Illness: The appropriate patient status for this patient is INPATIENT. Inpatient status is judged to be reasonable and necessary in order to provide the required intensity of service to ensure the patient's safety. The patient's presenting symptoms, physical exam findings, and initial radiographic and laboratory data in the context of their chronic comorbidities is felt to place them at high risk for further clinical deterioration. Furthermore, it is not anticipated that the patient will be medically stable for discharge from the hospital within 2 midnights of admission.   * I certify that at the point of admission it is my clinical judgment that the patient will require inpatient hospital care spanning beyond 2 midnights from the point of admission due to high intensity of service, high risk for further deterioration and high frequency of surveillance required.*  Author: Frankey Shown, DO 11/15/2023 4:53 AM  For on call review [email or web address].

## 2023-11-15 NOTE — H&P (View-Only) (Signed)
 [facility] Consult  Reason for Consult: Small bowel obstruction, mesenteric twisting  Referring Physician: Dr. Jola Schmidt   Chief Complaint   Abdominal Pain     HPI: Samuel Ferrell is a 78 y.o. male with a history of A fib, COPD, HTN, CVA, who comes in with abdominal pain and nausea. The pain was diffuse in nature. He had a CT scan that demonstrated a small bowel obstruction and a twisting of the mesentery in the right abdomen. He has never had anything like this prior. .His pain was improved this AM. He last took his Eliquis 3/5 in the AM.   Past Medical History:  Diagnosis Date   Afib [facility])    s/p ablation x2 by Dr Sampson Goon at [facility] in 2007, 2009   Alcohol abuse    Anemia    Atypical atrial flutter (HCC)    COPD (chronic obstructive pulmonary disease) (HCC)    COVID    mild case   CVA (cerebral infarction)    GERD (gastroesophageal reflux disease)    Heart murmur    Hyperlipidemia    Hypertension    Palpitations    Pneumonia    1970's   Skin cancer--melanoma    Stroke [facility])    TIA    Past Surgical History:  Procedure Laterality Date   ABDOMINAL AORTOGRAM N/A 01/18/2023   Procedure: ABDOMINAL AORTOGRAM;  Surgeon: Cephus Shelling, MD;  [facility] INV[facility] LAB;  Service: Cardiovascular;  Laterality: N/A;   ANTERIOR CERVICAL DECOMP/DISCECTOMY FUSION N/A 07/24/2018   Procedure: ANTERIOR CERVICAL DECOMPRESSION/DISCECTOMY FUSION, INTERBODY PROSTHESIS, PLATE SCREWS CERVICAL FIVE- CERVICAL SIX;  Surgeon: Tressie Stalker, MD;  Location: [facility] OR;  Service: Neurosurgery;  Laterality: N/A;  ANTERIOR CERVICAL DECOMPRESSION/DISCECTOMY FUSION, INTERBODY PROSTHESIS, PLATE SCREWS CERVICAL FIVE- CERVICAL SIX   APPENDECTOMY     ATRIAL FIBRILLATION ABLATION  2007 ,2009   Dr Sampson Goon at [facility]   BRONCHIAL BIOPSY  04/24/2022   Procedure: BRONCHIAL BIOPSIES;  Surgeon: Omar Person, MD;  Location: [facility] ENDOSCOPY;  Service: Pulmonary;;   BRONCHIAL BRUSHINGS   04/24/2022   Procedure: BRONCHIAL BRUSHINGS;  Surgeon: Omar Person, MD;  Location: [facility] ENDOSCOPY;  Service: Pulmonary;;   BRONCHIAL BRUSHINGS  05/19/2022   Procedure: BRONCHIAL BRUSHINGS;  Surgeon: Omar Person, MD;  Location: [facility] ENDOSCOPY;  Service: Pulmonary;;   BRONCHIAL NEEDLE ASPIRATION BIOPSY  04/24/2022   Procedure: BRONCHIAL NEEDLE ASPIRATION BIOPSIES;  Surgeon: Omar Person, MD;  Location: [facility] ENDOSCOPY;  Service: Pulmonary;;   BRONCHIAL NEEDLE ASPIRATION BIOPSY  05/19/2022   Procedure: BRONCHIAL NEEDLE ASPIRATION BIOPSIES;  Surgeon: Omar Person, MD;  Location: [facility] ENDOSCOPY;  Service: Pulmonary;;   BRONCHIAL WASHINGS  04/24/2022   Procedure: BRONCHIAL WASHINGS;  Surgeon: Omar Person, MD;  Location: [facility] ENDOSCOPY;  Service: Pulmonary;;   EYE SURGERY Bilateral    cataracts   FIDUCIAL MARKER PLACEMENT  05/19/2022   Procedure: DYE MARKING;  Surgeon: Omar Person, MD;  Location: [facility] ENDOSCOPY;  Service: Pulmonary;;   FRACTURE SURGERY     right clavicle   HEMOSTASIS CONTROL  04/24/2022   Procedure: HEMOSTASIS CONTROL;  Surgeon: Omar Person, MD;  Location: [facility] ENDOSCOPY;  Service: Pulmonary;;   INTERCOSTAL NERVE BLOCK Left 05/19/2022   Procedure: INTERCOSTAL NERVE BLOCK;  Surgeon: Loreli Slot, MD;  Location: [facility] OR;  Service: Thoracic;  Laterality: Left;   LYMPH NODE DISSECTION Left 05/19/2022   Procedure: LYMPH NODE DISSECTION;  Surgeon: Loreli Slot, MD;  Location: [facility] OR;  Service: Thoracic;  Laterality: Left;   PERIPHERAL VASCULAR INTERVENTION Bilateral 01/05/2022   Procedure: PERIPHERAL VASCULAR INTERVENTION;  Surgeon: Cephus Shelling, MD;  [facility];  Service: Cardiovascular;  Laterality: Bilateral;  Bilateral Renal Artery Stents   RENAL ANGIOGRAPHY Bilateral 01/05/2022   Procedure: RENAL ARTERY STENTING;  Surgeon: Cephus Shelling, MD;  [facility];  Service: Cardiovascular;  Laterality:  Bilateral;   RENAL ANGIOGRAPHY N/A 01/18/2023   Procedure: RENAL ANGIOGRAPHY;  Surgeon: Cephus Shelling, MD;  [facility];  Service: Cardiovascular;  Laterality: N/A;   RENAL INTERVENTION  01/18/2023   Procedure: RENAL INTERVENTION;  Surgeon: Cephus Shelling, MD;  [facility];  Service: Cardiovascular;;   TONSILLECTOMY     VIDEO BRONCHOSCOPY WITH RADIAL ENDOBRONCHIAL ULTRASOUND  04/24/2022   Procedure: VIDEO BRONCHOSCOPY WITH RADIAL ENDOBRONCHIAL ULTRASOUND;  Surgeon: Omar Person, MD;  Location: [facility] ENDOSCOPY;  Service: Pulmonary;;   VIDEO BRONCHOSCOPY WITH RADIAL ENDOBRONCHIAL ULTRASOUND  05/19/2022   Procedure: VIDEO BRONCHOSCOPY WITH RADIAL ENDOBRONCHIAL ULTRASOUND;  Surgeon: Omar Person, MD;  [facility] ENDOSCOPY;  Service: Pulmonary;;    Family History  Problem Relation Age of Onset   Lung cancer Brother        smoked   Cancer Other    Colon cancer Neg Hx    Esophageal cancer Neg Hx    Rectal cancer Neg Hx    Stomach cancer Neg Hx     Social History   Tobacco Use   Smoking status: Every Day    Current packs/day: 1.00    Average packs/day: 1 pack/day for 61.0 years (61.0 ttl pk-yrs)    Types: Cigarettes   Smokeless tobacco: Never  Vaping Use   Vaping status: Never Used  Substance Use Topics   Alcohol use: Not Currently    Comment: heavy, up to 6 beers per day   Drug use: No    Medications: I have reviewed the patient's current medications. Prior to Admission:  Medications Prior to Admission  Medication Sig Dispense Refill Last Dose/Taking   apixaban (ELIQUIS) 5 MG TABS tablet Take 5 mg by mouth 2 (two) times daily.   11/13/2023   aspirin EC 81 MG tablet Take 81 mg by mouth daily. Swallow whole.   11/13/2023   atorvastatin (LIPITOR) 40 MG tablet Take 40 mg by mouth every evening.    11/12/2023   dofetilide (TIKOSYN) 500 MCG capsule Take 1 capsule (500 mcg total) by mouth 2 (two) times daily. 180 capsule 2 11/13/2023    enalapril (VASOTEC) 10 MG tablet Take 1 tablet (10 mg total) by mouth at bedtime. 90 tablet 3 11/12/2023   ferrous sulfate 325 (65 FE) MG tablet Take 325 mg by mouth daily with breakfast. 250mg  per patient   11/12/2023   Magnesium 250 MG TABS Take 250 mg by mouth every evening.   11/12/2023   metoprolol tartrate (LOPRESSOR) 50 MG tablet Take 1.5 tablets (75 mg total) by mouth 2 (two) times daily. 270 tablet 3 11/13/2023   Multiple Vitamin (MULTIVITAMIN) tablet Take 1 tablet by mouth daily. Centrum 50+   11/12/2023   omeprazole (PRILOSEC) 20 MG capsule Take 20 mg by mouth daily.   11/13/2023   potassium chloride SA (KLOR-CON M20) 20 MEQ tablet Take 1 tablet (20 mEq total) by mouth daily. 90 tablet 2 11/13/2023   Tiotropium Bromide-Olodaterol (STIOLTO RESPIMAT) 2.5-2.5 MCG/ACT AERS Inhale 2 puffs into the lungs daily. (Patient taking differently: Inhale 1 puff into the lungs  2 (two) times daily.) 4 g 11 11/13/2023   vitamin C (ASCORBIC ACID) 250 MG tablet Take 250 mg by mouth daily.   11/13/2023   Scheduled:  arformoterol  15 mcg Nebulization BID   And   umeclidinium bromide  1 puff Inhalation Daily   Chlorhexidine Gluconate Cloth  6 each Topical Once   And   Chlorhexidine Gluconate Cloth  6 each Topical Once   docusate  200 mg Oral Daily   metoprolol tartrate  2.5 mg Intravenous Q4H   Continuous:  sodium chloride 100 mL/hr at 11/15/23 1404   [START ON 11/16/2023] cefoTEtan (CEFOTAN) IV     chlorproMAZINE (THORAZINE) 25 mg in sodium chloride 0.9 % 25 mL IVPB     VHQ:[id number] **OR** acetaminophen, chlorproMAZINE (THORAZINE) 25 mg in sodium chloride 0.9 % 25 mL IVPB, HYDROmorphone (DILAUDID) injection, metoCLOPramide (REGLAN) injection, ondansetron **OR** ondansetron (ZOFRAN) IV, phenol  No Known Allergies   ROS:  A comprehensive review of systems was negative except for: Gastrointestinal: positive for abdominal pain and nausea  Blood pressure (!) 154/73, pulse (!) 105, temperature 98.3 F (36.8 C),  resp. rate 18, height 6\' 1"  (1.854 m), weight 99.3 kg, SpO2 96%. Physical Exam  Results: Results for orders placed or performed during the hospital encounter of 11/14/23 (from the past 48 hours)  Lactic acid, plasma     Status: None   Collection Time: 11/14/23  8:09 PM  Result Value Ref Range   Lactic Acid, Venous 1.5 0.5 - 1.9 mmol/L    Comment: Performed at [facility], [address], [place], [place] [address]  Blood Culture (routine x 2)     Status: None (Preliminary result)   Collection Time: 11/14/23  8:09 PM   Specimen: BLOOD LEFT FOREARM  Result Value Ref Range   Specimen Description BLOOD LEFT FOREARM    Special Requests      BOTTLES DRAWN AEROBIC AND ANAEROBIC Blood Culture adequate volume   Culture      NO GROWTH < 12 HOURS Performed at [facility], [address], [place], [place] [address]    Report Status PENDING   Comprehensive metabolic panel     Status: Abnormal   Collection Time: 11/14/23  8:30 PM  Result Value Ref Range   Sodium 133 (L) 135 - 145 mmol/L   Potassium 4.5 3.5 - 5.1 mmol/L   Chloride 102 98 - 111 mmol/L   CO2 19 (L) 22 - 32 mmol/L   Glucose, Bld 129 (H) 70 - 99 mg/dL    Comment: Glucose reference range applies only to samples taken after fasting for at least 8 hours.   BUN 22 8 - 23 mg/dL   Creatinine, Ser 6.64 (H) 0.61 - 1.24 mg/dL   Calcium 9.0 8.9 - 40.3 mg/dL   Total Protein 7.3 6.5 - 8.1 g/dL   Albumin 3.9 3.5 - 5.0 g/dL   AST 39 15 - 41 U/L   ALT 75 (H) 0 - 44 U/L   Alkaline Phosphatase 87 38 - 126 U/L   Total Bilirubin 0.6 0.0 - 1.2 mg/dL   GFR, Estimated 43 (L) >60 mL/min    Comment: (NOTE) Calculated using the CKD-EPI Creatinine Equation (2021)    Anion gap 12 5 - 15    Comment: Performed at [facility], [address], [place], [place] [address]  CBC with Differential     Status: Abnormal   Collection Time: 11/14/23  8:30 PM  Result Value Ref Range   WBC 11.0 (H)  4.0 - 10.5 K/uL   RBC 4.04 (L) 4.22 - 5.81 MIL/uL    Hemoglobin 13.1 13.0 - 17.0 g/dL   HCT 27.2 (L) 53.6 - 64.4 %   MCV 95.8 80.0 - 100.0 fL   MCH 32.4 26.0 - 34.0 pg   MCHC 33.9 30.0 - 36.0 g/dL   RDW 03.4 74.2 - 59.5 %   Platelets 216 150 - 400 K/uL   nRBC 0.0 0.0 - 0.2 %   Neutrophils Relative % 82 %   Neutro Abs 8.9 (H) 1.7 - 7.7 K/uL   Lymphocytes Relative 11 %   Lymphs Abs 1.3 0.7 - 4.0 K/uL   Monocytes Relative 6 %   Monocytes Absolute 0.7 0.1 - 1.0 K/uL   Eosinophils Relative 1 %   Eosinophils Absolute 0.1 0.0 - 0.5 K/uL   Basophils Relative 0 %   Basophils Absolute 0.0 0.0 - 0.1 K/uL   Immature Granulocytes 0 %   Abs Immature Granulocytes 0.04 0.00 - 0.07 K/uL    Comment: Performed at [facility], [address] [place], [place] [address]  Protime-INR     Status: Abnormal   Collection Time: 11/14/23  8:30 PM  Result Value Ref Range   Prothrombin Time 15.4 (H) 11.4 - 15.2 seconds   INR 1.2 0.8 - 1.2    Comment: (NOTE) INR goal varies based on device and disease states. Performed at [facility], [address], [place], [place] [address]   APTT     Status: None   Collection Time: 11/14/23  8:30 PM  Result Value Ref Range   aPTT 26 24 - 36 seconds    Comment: Performed at [facility], [address], [place], [place] [address]  Blood Culture (routine x 2)     Status: None (Preliminary result)   Collection Time: 11/14/23  8:30 PM   Specimen: BLOOD LEFT HAND  Result Value Ref Range   Specimen Description      BLOOD LEFT HAND BOTTLES DRAWN AEROBIC AND ANAEROBIC   Special Requests Blood Culture adequate volume    Culture      NO GROWTH < 12 HOURS Performed at [facility], [address], [place], [place] [address]    Report Status PENDING   Comprehensive metabolic panel     Status: Abnormal   Collection Time: 11/15/23  5:29 AM  Result Value Ref Range   Sodium 134 (L) 135 - 145 mmol/L   Potassium 4.6 3.5 - 5.1 mmol/L   Chloride 101 98 - 111 mmol/L   CO2 21 (L) 22 - 32 mmol/L   Glucose, Bld 115 (H) 70 - 99 mg/dL     Comment: Glucose reference range applies only to samples taken after fasting for at least 8 hours.   BUN 23 8 - 23 mg/dL   Creatinine, Ser 6.30 (H) 0.61 - 1.24 mg/dL   Calcium 9.3 8.9 - 16.0 mg/dL   Total Protein 7.8 6.5 - 8.1 g/dL   Albumin 4.1 3.5 - 5.0 g/dL   AST 43 (H) 15 - 41 U/L   ALT 72 (H) 0 - 44 U/L   Alkaline Phosphatase 96 38 - 126 U/L   Total Bilirubin 0.9 0.0 - 1.2 mg/dL   GFR, Estimated 43 (L) >60 mL/min    Comment: (NOTE) Calculated using the CKD-EPI Creatinine Equation (2021)    Anion gap 12 5 - 15    Comment: Performed at [facility], [address], [place], [place] [address]  CBC  Status: None   Collection Time: 11/15/23  5:29 AM  Result Value Ref Range   WBC 8.6 4.0 - 10.5 K/uL   RBC 4.44 4.22 - 5.81 MIL/uL   Hemoglobin 14.2 13.0 - 17.0 g/dL   HCT 16.1 09.6 - 04.5 %   MCV 93.9 80.0 - 100.0 fL   MCH 32.0 26.0 - 34.0 pg   MCHC 34.1 30.0 - 36.0 g/dL   RDW 40.9 81.1 - 91.4 %   Platelets 216 150 - 400 K/uL   nRBC 0.0 0.0 - 0.2 %    Comment: Performed at [facility], [address], [place], [place] [address]  Magnesium     Status: None   Collection Time: 11/15/23  5:29 AM  Result Value Ref Range   Magnesium 2.1 1.7 - 2.4 mg/dL    Comment: Performed at [facility], [address], [place], [place] [address]  Phosphorus     Status: Abnormal   Collection Time: 11/15/23  5:29 AM  Result Value Ref Range   Phosphorus 5.4 (H) 2.5 - 4.6 mg/dL    Comment: Performed at [facility], [address] [place], [place] [address]  Urinalysis, w/ Reflex to Culture (Infection Suspected) -Urine, Clean Catch     Status: Abnormal   Collection Time: 11/15/23 12:00 PM  Result Value Ref Range   Specimen Source URINE, CLEAN CATCH    Color, Urine YELLOW YELLOW   APPearance HAZY (A) CLEAR   Specific Gravity, Urine >1.046 (H) 1.005 - 1.030   pH 6.0 5.0 - 8.0   Glucose, UA NEGATIVE NEGATIVE mg/dL   Hgb urine dipstick SMALL (A) NEGATIVE   Bilirubin Urine NEGATIVE NEGATIVE    Ketones, ur NEGATIVE NEGATIVE mg/dL   Protein, ur 30 (A) NEGATIVE mg/dL   Nitrite POSITIVE (A) NEGATIVE   Leukocytes,Ua MODERATE (A) NEGATIVE   RBC / HPF 21-50 0 - 5 RBC/hpf   WBC, UA >50 0 - 5 WBC/hpf    Comment:        Reflex urine culture not performed if WBC <=10, OR if Squamous epithelial cells >5. If Squamous epithelial cells >5 suggest recollection.    Bacteria, UA MANY (A) NONE SEEN   Squamous Epithelial / HPF 0-5 0 - 5 /HPF   WBC Clumps PRESENT    Mucus PRESENT     Comment: Performed at [facility], [address] [place], [place] [address]   CT scan and CTA from 2024 reviewed- showed patient- twisting of the mesentery and dilated bowel on new CT  DG Abd 1 View Result Date: 11/15/2023 CLINICAL DATA:  Small bowel obstruction, NG tube placement EXAM: ABDOMEN - 1 VIEW COMPARISON:  11/15/2023 FINDINGS: NG tube is in place with the tip in the fundus. Side port likely in the proximal stomach. Dilated small bowel loops again noted compatible with small bowel obstruction. IMPRESSION: NG tube in the proximal stomach. Electronically Signed   By: Charlett Nose M.D.   On: 11/15/2023 11:55   DG Chest Port 1 View Result Date: 11/15/2023 CLINICAL DATA:  NG tube placement.  Abdominal pain, weakness EXAM: PORTABLE CHEST 1 VIEW COMPARISON:  10/16/2023 FINDINGS: NG tube enters the stomach. Heart and mediastinal contours within normal limits. Aortic atherosclerosis. Areas of scarring in the lungs bilaterally, right greater than left. Bilateral calcified pleural plaques again noted. No acute confluent opacities or effusions. No acute bony abnormality. IMPRESSION: Chronic changes.  No active disease. Electronically Signed   By: Charlett Nose M.D.   On: 11/15/2023 11:54  DG Abd Portable 1 View Result Date: 11/15/2023 CLINICAL DATA:  NG tube EXAM: PORTABLE ABDOMEN - 1 VIEW COMPARISON:  CT abdomen and pelvis 11/14/2023 FINDINGS: Enteric tube tip in the stomach with side port likely in the distal esophagus.  Recommend advancement 3-4 cm to ensure side port is in the stomach. Remainder unchanged from recent CT abdomen and pelvis. IMPRESSION: 1. Enteric tube tip in the stomach with side port likely in the distal esophagus. Recommend advancement 3-4 cm to ensure side port is in the stomach. Electronically Signed   By: Minerva Fester M.D.   On: 11/15/2023 01:29   CT ABDOMEN PELVIS W CONTRAST Result Date: 11/14/2023 CLINICAL DATA:  Lower abdominal pain and dizziness. EXAM: CT ABDOMEN AND PELVIS WITH CONTRAST TECHNIQUE: Multidetector CT imaging of the abdomen and pelvis was performed using the standard protocol following bolus administration of intravenous contrast. RADIATION DOSE REDUCTION: This exam was performed according to the departmental dose-optimization program which includes automated exposure control, adjustment of the mA and/or kV according to patient size and/or use of iterative reconstruction technique. CONTRAST:  80mL OMNIPAQUE IOHEXOL 300 MG/ML  SOLN COMPARISON:  January 02, 2023 FINDINGS: Lower chest: There is mild, pleural base calcification within the bilateral lung bases. Hepatobiliary: Stable simple hepatic cysts are seen. No gallstones, gallbladder wall thickening, or biliary dilatation. Pancreas: Unremarkable. No pancreatic ductal dilatation or surrounding inflammatory changes. Spleen: Normal in size without focal abnormality. Adrenals/Urinary Tract: The right adrenal gland is unremarkable. 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 This area demonstrated no evidence of hypermetabolic activity on the prior PET CT (dated April 12, 2022). Kidneys are normal in size, without obstructing renal calculi or hydronephrosis. Small bilateral simple renal cysts are present. Bladder is unremarkable. Stomach/Bowel: Stomach is within normal limits. The appendix is surgically absent. Numerous dilated loops of jejunum and ileum are seen throughout the abdomen and  pelvis (maximum small bowel diameter of approximately 3.0 cm). An abrupt transition zone is seen along an area of mesenteric twisting within the mid right abdomen, within the region anterior to the right kidney (axial CT images 39 through 59, CT series 2). Vascular/Lymphatic: Aortic atherosclerosis. No enlarged abdominal or pelvic lymph nodes. Reproductive: Prostate is unremarkable. Other: No abdominal wall hernia or abnormality. No abdominopelvic ascites. Musculoskeletal: No acute or significant osseous findings. IMPRESSION: 1. Small bowel obstruction with an abrupt transition zone along an area of mesenteric twisting within the mid right abdomen. 2. Stable simple hepatic and bilateral renal cysts. 3. Stable left adrenal mass, as described above. 4. Aortic atherosclerosis. Aortic Atherosclerosis (ICD10-I70.0). Electronically Signed   By: Aram Candela M.D.   On: 11/14/2023 23:45     Assessment & Plan:  GERRITT GALENTINE is a 78 y.o. male with SBO on CT scan and concern for mesenteric twisting. This has been present on prior CT scans but without obstruction. This is not something that is going to go away. Appendectomy in the 1970s.   Ex lap, possible SBR OR tomorrow Consent orders signed NG, NPO  All questions were answered to the satisfaction of the patient.   Samuel Ferrell 11/15/2023, 9:15 PM

## 2023-11-15 NOTE — ED Notes (Addendum)
 HR noted to be 150, pt asymptomatic. Dr Haroldine Laws paged. EKG obtained.

## 2023-11-15 NOTE — Progress Notes (Signed)
   11/15/23 1413  Assess: MEWS Score  Pulse Rate (!) 115  Assess: MEWS Score  MEWS Temp 0  MEWS Systolic 0  MEWS Pulse 2  MEWS RR 0  MEWS LOC 0  MEWS Score 2  MEWS Score Color Yellow  Assess: if the MEWS score is Yellow or Red  Were vital signs accurate and taken at a resting state? Yes  Does the patient meet 2 or more of the SIRS criteria? No  Provider Notification  Provider Name/Title Jola Schmidt  Date Provider Notified 11/15/23  Time Provider Notified 1443  Method of Notification Page  Provider response See new orders (increased metoprolol dose)  Date of Provider Response 11/15/23  Time of Provider Response 1444  Assess: SIRS CRITERIA  SIRS Temperature  0  SIRS Respirations  0  SIRS Pulse 1  SIRS WBC 0  SIRS Score Sum  1

## 2023-11-15 NOTE — ED Provider Notes (Signed)
  Provider Note MRN:  [id number]  Arrival date & time: 11/15/23    ED Course and Medical Decision Making  Assumed care of patient at sign-out or upon transfer.  Concern for SBO awaiting imaging.  12 AM update: Patient does have confirmed SBO with twisted mesentery.  Imaging reviewed and patient's clinical status discussed with Dr. Larae Grooms of general surgery.  Patient continues to look well with normal vital signs, not really having much pain.  Abdomen is quite distended.  Plan is for NG tube and hospitalist admission.  May need surgical intervention.  .Critical Care  Performed by: Sabas Sous, MD Authorized by: Sabas Sous, MD   Critical care provider statement:    Critical care time (minutes):  32   Critical care was necessary to treat or prevent imminent or life-threatening deterioration of the following conditions: Acute SBO.   Critical care was time spent personally by me on the following activities:  Development of treatment plan with patient or surrogate, discussions with consultants, evaluation of patient's response to treatment, examination of patient, ordering and review of laboratory studies, ordering and review of radiographic studies, ordering and performing treatments and interventions, pulse oximetry, re-evaluation of patient's condition and review of old charts   Final Clinical Impressions(s) / ED Diagnoses     ICD-10-CM   1. Abdominal pain, unspecified abdominal location  R10.9       ED Discharge Orders     None       Discharge Instructions   None     Elmer Sow. Pilar Plate, MD [facility] [email or web address]    Sabas Sous, MD 11/15/23 [phone]

## 2023-11-15 NOTE — Hospital Course (Addendum)
 Samuel Ferrell is a 78 y.o. male with medical history significant of hypertension, hyperlipidemia, COPD, A-Fib status post ablation x 2, tobacco abuse who presents to the emergency department due to Abdominal pain which started about 5 hours PTA, this was associated with nausea without vomiting.  Pain was diffuse and severe in nature.     ED Course:  BP was 70/40 on arrival to the ED, but other vital signs were within normal range. Normal CBC except for WBC of 11.0, hematocrit 38.7.  BMP showed Sodium 133, Potassium 4.5, chloride 102, bicarb 19, blood glucose 129, BUN 22, creatinine 1.63 (baseline creatinine at 1.2 - 1.3), ALT 75.  Blood culture pending.  CT abdomen and pelvis with contrast showed small bowel obstruction with an abrupt transition zone along an area of mesenteric twisting within the mid right abdomen. General surgery, Dr. Henreitta Leber was consulted and will follow-up on patient in the morning with plan to possibly do surgery on Friday (3/7).    Assessment & Plan:   Principal Problem:   Small bowel obstruction (HCC) Active Problems:   COPD (chronic obstructive pulmonary disease) (HCC)   AKI (acute kidney injury) (HCC)   Essential hypertension   Mixed hyperlipidemia   Atrial fibrillation, chronic (HCC)    Assessment/Plan   Small bowel obstruction -valvulitis  11/16/2023, postop day 1 S/p small bowel volvulus reduction, no resection was needed NG tube will remain in place, NG tube about 190 cc out -Still n.p.o. -No gas or bowel movement reported yet  until bowel function returns per surgery recommendations Continue abdominal binder  - CT abdominal pelvis was suggestive of small bowel obstruction  Continue IV hydration--changed to D5 half-normal saline Continue IV Dilaudid 0.5 mg q.3h p.r.n. for moderate/severe pain Continue Zofran p.r.n. for nausea/vomiting General Surgery (Dr. Henreitta Leber)   - Holding Eliquis    Acute kidney injury Creatinine 1.63 >> 1.36 >> 1.35    (baseline creatinine at 1.2 - 1.3) Continue gentle hydration Renally adjust medications, avoid nephrotoxic agents/dehydration/hypotension   Essential hypertension -Mildly hypertensive  Continue IV hydralazine 10 mg every 6 hours as needed for SBP > 170 Consider resuming home meds when patient resumes oral intake   Mixed hyperlipidemia Holding p.o. meds    Chronic A-fib Continue IV metoprolol 2.5 mg every 6 hours as needed for HR > 110 -N.p.o., holding p.o. meds for now -Holding Eliquis   COPD Continue Brovana and Incruse Ellipta

## 2023-11-15 NOTE — Consult Note (Addendum)
 [facility] Consult  Reason for Consult: Small bowel obstruction, mesenteric twisting  Referring Physician: Dr. Jola Schmidt   Chief Complaint   Abdominal Pain     HPI: Samuel Ferrell is a 78 y.o. male with a history of A fib, COPD, HTN, CVA, who comes in with abdominal pain and nausea. The pain was diffuse in nature. He had a CT scan that demonstrated a small bowel obstruction and a twisting of the mesentery in the right abdomen. He has never had anything like this prior. .His pain was improved this AM. He last took his Eliquis 3/5 in the AM.   Past Medical History:  Diagnosis Date   Afib [facility])    s/p ablation x2 by Dr Sampson Goon at [facility] in 2007, 2009   Alcohol abuse    Anemia    Atypical atrial flutter (HCC)    COPD (chronic obstructive pulmonary disease) (HCC)    COVID    mild case   CVA (cerebral infarction)    GERD (gastroesophageal reflux disease)    Heart murmur    Hyperlipidemia    Hypertension    Palpitations    Pneumonia    1970's   Skin cancer--melanoma    Stroke [facility])    TIA    Past Surgical History:  Procedure Laterality Date   ABDOMINAL AORTOGRAM N/A 01/18/2023   Procedure: ABDOMINAL AORTOGRAM;  Surgeon: Cephus Shelling, MD;  [facility];  Service: Cardiovascular;  Laterality: N/A;   ANTERIOR CERVICAL DECOMP/DISCECTOMY FUSION N/A 07/24/2018   Procedure: ANTERIOR CERVICAL DECOMPRESSION/DISCECTOMY FUSION, INTERBODY PROSTHESIS, PLATE SCREWS CERVICAL FIVE- CERVICAL SIX;  Surgeon: Tressie Stalker, MD;  Location: [facility] OR;  Service: Neurosurgery;  Laterality: N/A;  ANTERIOR CERVICAL DECOMPRESSION/DISCECTOMY FUSION, INTERBODY PROSTHESIS, PLATE SCREWS CERVICAL FIVE- CERVICAL SIX   APPENDECTOMY     ATRIAL FIBRILLATION ABLATION  2007 ,2009   Dr Sampson Goon at [facility]   BRONCHIAL BIOPSY  04/24/2022   Procedure: BRONCHIAL BIOPSIES;  Surgeon: Omar Person, MD;  Location: [facility] ENDOSCOPY;  Service: Pulmonary;;   BRONCHIAL BRUSHINGS   04/24/2022   Procedure: BRONCHIAL BRUSHINGS;  Surgeon: Omar Person, MD;  Location: [facility] ENDOSCOPY;  Service: Pulmonary;;   BRONCHIAL BRUSHINGS  05/19/2022   Procedure: BRONCHIAL BRUSHINGS;  Surgeon: Omar Person, MD;  Location: [facility] ENDOSCOPY;  Service: Pulmonary;;   BRONCHIAL NEEDLE ASPIRATION BIOPSY  04/24/2022   Procedure: BRONCHIAL NEEDLE ASPIRATION BIOPSIES;  Surgeon: Omar Person, MD;  Location: [facility] ENDOSCOPY;  Service: Pulmonary;;   BRONCHIAL NEEDLE ASPIRATION BIOPSY  05/19/2022   Procedure: BRONCHIAL NEEDLE ASPIRATION BIOPSIES;  Surgeon: Omar Person, MD;  Location: [facility] ENDOSCOPY;  Service: Pulmonary;;   BRONCHIAL WASHINGS  04/24/2022   Procedure: BRONCHIAL WASHINGS;  Surgeon: Omar Person, MD;  Location: [facility] ENDOSCOPY;  Service: Pulmonary;;   EYE SURGERY Bilateral    cataracts   FIDUCIAL MARKER PLACEMENT  05/19/2022   Procedure: DYE MARKING;  Surgeon: Omar Person, MD;  Location: [facility] ENDOSCOPY;  Service: Pulmonary;;   FRACTURE SURGERY     right clavicle   HEMOSTASIS CONTROL  04/24/2022   Procedure: HEMOSTASIS CONTROL;  Surgeon: Omar Person, MD;  Location: [facility] ENDOSCOPY;  Service: Pulmonary;;   INTERCOSTAL NERVE BLOCK Left 05/19/2022   Procedure: INTERCOSTAL NERVE BLOCK;  Surgeon: Loreli Slot, MD;  Location: [facility] OR;  Service: Thoracic;  Laterality: Left;   LYMPH NODE DISSECTION Left 05/19/2022   Procedure: LYMPH NODE DISSECTION;  Surgeon: Loreli Slot, MD;  Location: [facility] OR;  Service: Thoracic;  Laterality: Left;   PERIPHERAL VASCULAR INTERVENTION Bilateral 01/05/2022   Procedure: PERIPHERAL VASCULAR INTERVENTION;  Surgeon: Cephus Shelling, MD;  [facility];  Service: Cardiovascular;  Laterality: Bilateral;  Bilateral Renal Artery Stents   RENAL ANGIOGRAPHY Bilateral 01/05/2022   Procedure: RENAL ARTERY STENTING;  Surgeon: Cephus Shelling, MD;  [facility];  Service: Cardiovascular;  Laterality:  Bilateral;   RENAL ANGIOGRAPHY N/A 01/18/2023   Procedure: RENAL ANGIOGRAPHY;  Surgeon: Cephus Shelling, MD;  [facility];  Service: Cardiovascular;  Laterality: N/A;   RENAL INTERVENTION  01/18/2023   Procedure: RENAL INTERVENTION;  Surgeon: Cephus Shelling, MD;  [facility];  Service: Cardiovascular;;   TONSILLECTOMY     VIDEO BRONCHOSCOPY WITH RADIAL ENDOBRONCHIAL ULTRASOUND  04/24/2022   Procedure: VIDEO BRONCHOSCOPY WITH RADIAL ENDOBRONCHIAL ULTRASOUND;  Surgeon: Omar Person, MD;  Location: [facility] ENDOSCOPY;  Service: Pulmonary;;   VIDEO BRONCHOSCOPY WITH RADIAL ENDOBRONCHIAL ULTRASOUND  05/19/2022   Procedure: VIDEO BRONCHOSCOPY WITH RADIAL ENDOBRONCHIAL ULTRASOUND;  Surgeon: Omar Person, MD;  [facility] ENDOSCOPY;  Service: Pulmonary;;    Family History  Problem Relation Age of Onset   Lung cancer Brother        smoked   Cancer Other    Colon cancer Neg Hx    Esophageal cancer Neg Hx    Rectal cancer Neg Hx    Stomach cancer Neg Hx     Social History   Tobacco Use   Smoking status: Every Day    Current packs/day: 1.00    Average packs/day: 1 pack/day for 61.0 years (61.0 ttl pk-yrs)    Types: Cigarettes   Smokeless tobacco: Never  Vaping Use   Vaping status: Never Used  Substance Use Topics   Alcohol use: Not Currently    Comment: heavy, up to 6 beers per day   Drug use: No    Medications: I have reviewed the patient's current medications. Prior to Admission:  Medications Prior to Admission  Medication Sig Dispense Refill Last Dose/Taking   apixaban (ELIQUIS) 5 MG TABS tablet Take 5 mg by mouth 2 (two) times daily.   11/13/2023   aspirin EC 81 MG tablet Take 81 mg by mouth daily. Swallow whole.   11/13/2023   atorvastatin (LIPITOR) 40 MG tablet Take 40 mg by mouth every evening.    11/12/2023   dofetilide (TIKOSYN) 500 MCG capsule Take 1 capsule (500 mcg total) by mouth 2 (two) times daily. 180 capsule 2 11/13/2023    enalapril (VASOTEC) 10 MG tablet Take 1 tablet (10 mg total) by mouth at bedtime. 90 tablet 3 11/12/2023   ferrous sulfate 325 (65 FE) MG tablet Take 325 mg by mouth daily with breakfast. 250mg  per patient   11/12/2023   Magnesium 250 MG TABS Take 250 mg by mouth every evening.   11/12/2023   metoprolol tartrate (LOPRESSOR) 50 MG tablet Take 1.5 tablets (75 mg total) by mouth 2 (two) times daily. 270 tablet 3 11/13/2023   Multiple Vitamin (MULTIVITAMIN) tablet Take 1 tablet by mouth daily. Centrum 50+   11/12/2023   omeprazole (PRILOSEC) 20 MG capsule Take 20 mg by mouth daily.   11/13/2023   potassium chloride SA (KLOR-CON M20) 20 MEQ tablet Take 1 tablet (20 mEq total) by mouth daily. 90 tablet 2 11/13/2023   Tiotropium Bromide-Olodaterol (STIOLTO RESPIMAT) 2.5-2.5 MCG/ACT AERS Inhale 2 puffs into the lungs daily. (Patient taking differently: Inhale 1 puff into the lungs  2 (two) times daily.) 4 g 11 11/13/2023   vitamin C (ASCORBIC ACID) 250 MG tablet Take 250 mg by mouth daily.   11/13/2023   Scheduled:  arformoterol  15 mcg Nebulization BID   And   umeclidinium bromide  1 puff Inhalation Daily   Chlorhexidine Gluconate Cloth  6 each Topical Once   And   Chlorhexidine Gluconate Cloth  6 each Topical Once   docusate  200 mg Oral Daily   metoprolol tartrate  2.5 mg Intravenous Q4H   Continuous:  sodium chloride 100 mL/hr at 11/15/23 1404   [START ON 11/16/2023] cefoTEtan (CEFOTAN) IV     chlorproMAZINE (THORAZINE) 25 mg in sodium chloride 0.9 % 25 mL IVPB     ZOX:[id number] **OR** acetaminophen, chlorproMAZINE (THORAZINE) 25 mg in sodium chloride 0.9 % 25 mL IVPB, HYDROmorphone (DILAUDID) injection, metoCLOPramide (REGLAN) injection, ondansetron **OR** ondansetron (ZOFRAN) IV, phenol  No Known Allergies   ROS:  A comprehensive review of systems was negative except for: Gastrointestinal: positive for abdominal pain and nausea  Blood pressure (!) 154/73, pulse (!) 105, temperature 98.3 F (36.8 C),  resp. rate 18, height 6\' 1"  (1.854 m), weight 99.3 kg, SpO2 96%. Physical Exam Vitals reviewed.  HENT:     Head: Normocephalic.  Eyes:     Extraocular Movements: Extraocular movements intact.  Cardiovascular:     Rate and Rhythm: Normal rate.  Pulmonary:     Effort: Pulmonary effort is normal.  Abdominal:     General: There is distension.     Palpations: Abdomen is soft.     Tenderness: There is abdominal tenderness. There is no guarding or rebound.  Skin:    General: Skin is warm.  Neurological:     General: No focal deficit present.     Mental Status: He is alert and oriented to person, place, and time.  Psychiatric:        Mood and Affect: Mood normal.        Behavior: Behavior normal.     Results: Results for orders placed or performed during the hospital encounter of 11/14/23 (from the past 48 hours)  Lactic acid, plasma     Status: None   Collection Time: 11/14/23  8:09 PM  Result Value Ref Range   Lactic Acid, Venous 1.5 0.5 - 1.9 mmol/L    Comment: Performed at [facility], [address], [place], [place] [address]  Blood Culture (routine x 2)     Status: None (Preliminary result)   Collection Time: 11/14/23  8:09 PM   Specimen: BLOOD LEFT FOREARM  Result Value Ref Range   Specimen Description BLOOD LEFT FOREARM    Special Requests      BOTTLES DRAWN AEROBIC AND ANAEROBIC Blood Culture adequate volume   Culture      NO GROWTH < 12 HOURS Performed at [facility], [address], [place], [place] [address]    Report Status PENDING   Comprehensive metabolic panel     Status: Abnormal   Collection Time: 11/14/23  8:30 PM  Result Value Ref Range   Sodium 133 (L) 135 - 145 mmol/L   Potassium 4.5 3.5 - 5.1 mmol/L   Chloride 102 98 - 111 mmol/L   CO2 19 (L) 22 - 32 mmol/L   Glucose, Bld 129 (H) 70 - 99 mg/dL    Comment: Glucose reference range applies only to samples taken after fasting for at least 8 hours.   BUN 22 8 - 23 mg/dL   Creatinine,  Ser 1.63 (H) 0.61  - 1.24 mg/dL   Calcium 9.0 8.9 - 16.1 mg/dL   Total Protein 7.3 6.5 - 8.1 g/dL   Albumin 3.9 3.5 - 5.0 g/dL   AST 39 15 - 41 U/L   ALT 75 (H) 0 - 44 U/L   Alkaline Phosphatase 87 38 - 126 U/L   Total Bilirubin 0.6 0.0 - 1.2 mg/dL   GFR, Estimated 43 (L) >60 mL/min    Comment: (NOTE) Calculated using the CKD-EPI Creatinine Equation (2021)    Anion gap 12 5 - 15    Comment: Performed at [facility], [address] [place], [place] [address]  CBC with Differential     Status: Abnormal   Collection Time: 11/14/23  8:30 PM  Result Value Ref Range   WBC 11.0 (H) 4.0 - 10.5 K/uL   RBC 4.04 (L) 4.22 - 5.81 MIL/uL   Hemoglobin 13.1 13.0 - 17.0 g/dL   HCT 54.0 (L) 98.1 - 19.1 %   MCV 95.8 80.0 - 100.0 fL   MCH 32.4 26.0 - 34.0 pg   MCHC 33.9 30.0 - 36.0 g/dL   RDW 47.8 29.5 - 62.1 %   Platelets 216 150 - 400 K/uL   nRBC 0.0 0.0 - 0.2 %   Neutrophils Relative % 82 %   Neutro Abs 8.9 (H) 1.7 - 7.7 K/uL   Lymphocytes Relative 11 %   Lymphs Abs 1.3 0.7 - 4.0 K/uL   Monocytes Relative 6 %   Monocytes Absolute 0.7 0.1 - 1.0 K/uL   Eosinophils Relative 1 %   Eosinophils Absolute 0.1 0.0 - 0.5 K/uL   Basophils Relative 0 %   Basophils Absolute 0.0 0.0 - 0.1 K/uL   Immature Granulocytes 0 %   Abs Immature Granulocytes 0.04 0.00 - 0.07 K/uL    Comment: Performed at [facility], [address], [place], [place] [address]  Protime-INR     Status: Abnormal   Collection Time: 11/14/23  8:30 PM  Result Value Ref Range   Prothrombin Time 15.4 (H) 11.4 - 15.2 seconds   INR 1.2 0.8 - 1.2    Comment: (NOTE) INR goal varies based on device and disease states. Performed at [facility], [address] [place], [place] [address]   APTT     Status: None   Collection Time: 11/14/23  8:30 PM  Result Value Ref Range   aPTT 26 24 - 36 seconds    Comment: Performed at [facility], [address], [place], [place] [address]  Blood Culture (routine x 2)     Status: None (Preliminary result)    Collection Time: 11/14/23  8:30 PM   Specimen: BLOOD LEFT HAND  Result Value Ref Range   Specimen Description      BLOOD LEFT HAND BOTTLES DRAWN AEROBIC AND ANAEROBIC   Special Requests Blood Culture adequate volume    Culture      NO GROWTH < 12 HOURS Performed at [facility], [address] [place], [place] [address]    Report Status PENDING   Comprehensive metabolic panel     Status: Abnormal   Collection Time: 11/15/23  5:29 AM  Result Value Ref Range   Sodium 134 (L) 135 - 145 mmol/L   Potassium 4.6 3.5 - 5.1 mmol/L   Chloride 101 98 - 111 mmol/L   CO2 21 (L) 22 - 32 mmol/L   Glucose, Bld 115 (H) 70 - 99 mg/dL    Comment: Glucose reference range applies  only to samples taken after fasting for at least 8 hours.   BUN 23 8 - 23 mg/dL   Creatinine, Ser 0.98 (H) 0.61 - 1.24 mg/dL   Calcium 9.3 8.9 - 11.9 mg/dL   Total Protein 7.8 6.5 - 8.1 g/dL   Albumin 4.1 3.5 - 5.0 g/dL   AST 43 (H) 15 - 41 U/L   ALT 72 (H) 0 - 44 U/L   Alkaline Phosphatase 96 38 - 126 U/L   Total Bilirubin 0.9 0.0 - 1.2 mg/dL   GFR, Estimated 43 (L) >60 mL/min    Comment: (NOTE) Calculated using the CKD-EPI Creatinine Equation (2021)    Anion gap 12 5 - 15    Comment: Performed at [facility], [address] [place], [place] [address]  CBC     Status: None   Collection Time: 11/15/23  5:29 AM  Result Value Ref Range   WBC 8.6 4.0 - 10.5 K/uL   RBC 4.44 4.22 - 5.81 MIL/uL   Hemoglobin 14.2 13.0 - 17.0 g/dL   HCT 95.6 21.3 - 08.6 %   MCV 93.9 80.0 - 100.0 fL   MCH 32.0 26.0 - 34.0 pg   MCHC 34.1 30.0 - 36.0 g/dL   RDW 57.8 46.9 - 62.9 %   Platelets 216 150 - 400 K/uL   nRBC 0.0 0.0 - 0.2 %    Comment: Performed at [facility], [address], [place], [place] [address]  Magnesium     Status: None   Collection Time: 11/15/23  5:29 AM  Result Value Ref Range   Magnesium 2.1 1.7 - 2.4 mg/dL    Comment: Performed at [facility], [address] [place], [place] [address]  Phosphorus     Status:  Abnormal   Collection Time: 11/15/23  5:29 AM  Result Value Ref Range   Phosphorus 5.4 (H) 2.5 - 4.6 mg/dL    Comment: Performed at [facility], [address] [place], [place] [address]  Urinalysis, w/ Reflex to Culture (Infection Suspected) -Urine, Clean Catch     Status: Abnormal   Collection Time: 11/15/23 12:00 PM  Result Value Ref Range   Specimen Source URINE, CLEAN CATCH    Color, Urine YELLOW YELLOW   APPearance HAZY (A) CLEAR   Specific Gravity, Urine >1.046 (H) 1.005 - 1.030   pH 6.0 5.0 - 8.0   Glucose, UA NEGATIVE NEGATIVE mg/dL   Hgb urine dipstick SMALL (A) NEGATIVE   Bilirubin Urine NEGATIVE NEGATIVE   Ketones, ur NEGATIVE NEGATIVE mg/dL   Protein, ur 30 (A) NEGATIVE mg/dL   Nitrite POSITIVE (A) NEGATIVE   Leukocytes,Ua MODERATE (A) NEGATIVE   RBC / HPF 21-50 0 - 5 RBC/hpf   WBC, UA >50 0 - 5 WBC/hpf    Comment:        Reflex urine culture not performed if WBC <=10, OR if Squamous epithelial cells >5. If Squamous epithelial cells >5 suggest recollection.    Bacteria, UA MANY (A) NONE SEEN   Squamous Epithelial / HPF 0-5 0 - 5 /HPF   WBC Clumps PRESENT    Mucus PRESENT     Comment: Performed at [facility], [address], [place], [place] [address]   CT scan and CTA from 2024 reviewed- showed patient- twisting of the mesentery and dilated bowel on new CT  DG Abd 1 View Result Date: 11/15/2023 CLINICAL DATA:  Small bowel obstruction, NG tube placement EXAM: ABDOMEN - 1 VIEW COMPARISON:  11/15/2023 FINDINGS: NG tube  is in place with the tip in the fundus. Side port likely in the proximal stomach. Dilated small bowel loops again noted compatible with small bowel obstruction. IMPRESSION: NG tube in the proximal stomach. Electronically Signed   By: Charlett Nose M.D.   On: 11/15/2023 11:55   DG Chest Port 1 View Result Date: 11/15/2023 CLINICAL DATA:  NG tube placement.  Abdominal pain, weakness EXAM: PORTABLE CHEST 1 VIEW COMPARISON:  10/16/2023 FINDINGS: NG tube  enters the stomach. Heart and mediastinal contours within normal limits. Aortic atherosclerosis. Areas of scarring in the lungs bilaterally, right greater than left. Bilateral calcified pleural plaques again noted. No acute confluent opacities or effusions. No acute bony abnormality. IMPRESSION: Chronic changes.  No active disease. Electronically Signed   By: Charlett Nose M.D.   On: 11/15/2023 11:54   DG Abd Portable 1 View Result Date: 11/15/2023 CLINICAL DATA:  NG tube EXAM: PORTABLE ABDOMEN - 1 VIEW COMPARISON:  CT abdomen and pelvis 11/14/2023 FINDINGS: Enteric tube tip in the stomach with side port likely in the distal esophagus. Recommend advancement 3-4 cm to ensure side port is in the stomach. Remainder unchanged from recent CT abdomen and pelvis. IMPRESSION: 1. Enteric tube tip in the stomach with side port likely in the distal esophagus. Recommend advancement 3-4 cm to ensure side port is in the stomach. Electronically Signed   By: Minerva Fester M.D.   On: 11/15/2023 01:29   CT ABDOMEN PELVIS W CONTRAST Result Date: 11/14/2023 CLINICAL DATA:  Lower abdominal pain and dizziness. EXAM: CT ABDOMEN AND PELVIS WITH CONTRAST TECHNIQUE: Multidetector CT imaging of the abdomen and pelvis was performed using the standard protocol following bolus administration of intravenous contrast. RADIATION DOSE REDUCTION: This exam was performed according to the departmental dose-optimization program which includes automated exposure control, adjustment of the mA and/or kV according to patient size and/or use of iterative reconstruction technique. CONTRAST:  80mL OMNIPAQUE IOHEXOL 300 MG/ML  SOLN COMPARISON:  January 02, 2023 FINDINGS: Lower chest: There is mild, pleural base calcification within the bilateral lung bases. Hepatobiliary: Stable simple hepatic cysts are seen. No gallstones, gallbladder wall thickening, or biliary dilatation. Pancreas: Unremarkable. No pancreatic ductal dilatation or surrounding inflammatory  changes. Spleen: Normal in size without focal abnormality. Adrenals/Urinary Tract: The right adrenal gland is unremarkable. 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 This area demonstrated no evidence of hypermetabolic activity on the prior PET CT (dated April 12, 2022). Kidneys are normal in size, without obstructing renal calculi or hydronephrosis. Small bilateral simple renal cysts are present. Bladder is unremarkable. Stomach/Bowel: Stomach is within normal limits. The appendix is surgically absent. Numerous dilated loops of jejunum and ileum are seen throughout the abdomen and pelvis (maximum small bowel diameter of approximately 3.0 cm). An abrupt transition zone is seen along an area of mesenteric twisting within the mid right abdomen, within the region anterior to the right kidney (axial CT images 39 through 59, CT series 2). Vascular/Lymphatic: Aortic atherosclerosis. No enlarged abdominal or pelvic lymph nodes. Reproductive: Prostate is unremarkable. Other: No abdominal wall hernia or abnormality. No abdominopelvic ascites. Musculoskeletal: No acute or significant osseous findings. IMPRESSION: 1. Small bowel obstruction with an abrupt transition zone along an area of mesenteric twisting within the mid right abdomen. 2. Stable simple hepatic and bilateral renal cysts. 3. Stable left adrenal mass, as described above. 4. Aortic atherosclerosis. Aortic Atherosclerosis (ICD10-I70.0). Electronically Signed   By: Aram Candela M.D.   On: 11/14/2023  23:45     Assessment & Plan:  Samuel Ferrell is a 78 y.o. male with SBO on CT scan and concern for mesenteric twisting. This has been present on prior CT scans but without obstruction. This is not something that is going to go away. Appendectomy in the 1970s.   Ex lap, possible SBR OR tomorrow Consent orders signed NG, NPO  All questions were answered to the satisfaction of the patient.   Lucretia Roers 11/15/2023, 9:15 PM

## 2023-11-15 NOTE — ED Notes (Signed)
 Xray called again to confirm placement

## 2023-11-15 NOTE — Progress Notes (Addendum)
 Experiencing hiccups and cramps  Given reglan IV and hiccups stopped.  Bladder scan again showed 300 mls.  NG tube flushed,  NG out put since admit to floor was net 50 mls pink (doing throat spray)  Received order for in and out cath.  Yielded 500 mls

## 2023-11-15 NOTE — Progress Notes (Signed)
 PROGRESS NOTE    Patient: Samuel Ferrell                            PCP: Gaspar Garbe, MD                    DOB: 21-Jul-1946            DOA: 11/14/2023 [id number]:[id number]             DOS: 11/15/2023, 10:25 AM   LOS: 0 days   Date of Service: The patient was seen and examined on 11/15/2023  Subjective:   The patient was seen and examined this morning. Episode of tachycardia blood pressure running soft currently 114/58 satting 97% on room air NG tube in place Reporting of nausea but no vomiting Reporting no gas or bowel movement overnight.  Brief Narrative:   Samuel Ferrell is a 78 y.o. male with medical history significant of hypertension, hyperlipidemia, COPD, A-Fib status post ablation x 2, tobacco abuse who presents to the emergency department due to Abdominal pain which started about 5 hours PTA, this was associated with nausea without vomiting.  Pain was diffuse and severe in nature.     ED Course:  BP was 70/40 on arrival to the ED, but other vital signs were within normal range. Normal CBC except for WBC of 11.0, hematocrit 38.7.  BMP showed Sodium 133, Potassium 4.5, chloride 102, bicarb 19, blood glucose 129, BUN 22, creatinine 1.63 (baseline creatinine at 1.2 - 1.3), ALT 75.  Blood culture pending.  CT abdomen and pelvis with contrast showed small bowel obstruction with an abrupt transition zone along an area of mesenteric twisting within the mid right abdomen. General surgery, Dr. Henreitta Leber was consulted and will follow-up on patient in the morning with plan to possibly do surgery on Friday (3/7).    Assessment & Plan:   Principal Problem:   Small bowel obstruction (HCC) Active Problems:   COPD (chronic obstructive pulmonary disease) (HCC)   AKI (acute kidney injury) (HCC)   Essential hypertension   Mixed hyperlipidemia   Atrial fibrillation, chronic (HCC)    Assessment/Plan   Small bowel obstruction - CT abdominal pelvis was suggestive of small bowel obstruction -  Continue NG tube  - Continue NPO at this time   Continue IV hydration Continue IV Dilaudid 0.5 mg q.3h p.r.n. for moderate/severe pain Continue Zofran p.r.n. for nausea/vomiting General Surgery (Dr. Henreitta Leber) consult was appreciated.   She will follow-up with patient in the morning with plan for possible surgery tomorrow (3/7) to permit for Eliquis washout.   Acute kidney injury Creatinine 1.63 (baseline creatinine at 1.2 - 1.3) Continue gentle hydration Renally adjust medications, avoid nephrotoxic agents/dehydration/hypotension   Essential hypertension Continue IV hydralazine 10 mg every 6 hours as needed for SBP > 170 Consider resuming home meds when patient resumes oral intake   Mixed hyperlipidemia Consider resuming home meds when patient resumes oral intake   Chronic A-fib Continue IV metoprolol 2.5 mg every 6 hours as needed for HR > 110 -N.p.o., holding p.o. meds for now   COPD Continue Brovana and Incruse Ellipta ----------------------------------------------------------------------------------------------------------------------------------------------- Nutritional status:  The patient's BMI is: Body mass index is 28.88 kg/m. I agree with the assessment and plan as outlined------------------------------------------------------------------------------------------------------------------------------------------------  DVT prophylaxis:  SCDs Start: 11/15/23 0435   Code Status:   Code Status: Full Code  Family Communication: No family member present at bedside- attempt will be made  to update daily The above findings and plan of care has been discussed with patient (and family)  in detail,  they expressed understanding and agreement of above. -Advance care planning has been discussed.   Admission status:   Status is: Inpatient Remains inpatient appropriate because: Needing close observation, n.p.o., NG tube in place, management for small bowel obstruction, surgical  evaluation   Disposition: From  - home             Planning for discharge in 1-2 days: to   Procedures:   No admission procedures for hospital encounter.   Antimicrobials:  Anti-infectives (From admission, onward)    None        Medication:   arformoterol  15 mcg Nebulization BID   And   umeclidinium bromide  1 puff Inhalation Daily   docusate  200 mg Oral Daily   metoprolol tartrate  2.5 mg Intravenous Q6H    acetaminophen **OR** acetaminophen, chlorproMAZINE (THORAZINE) 25 mg in sodium chloride 0.9 % 25 mL IVPB, HYDROmorphone (DILAUDID) injection, metoCLOPramide (REGLAN) injection, ondansetron **OR** ondansetron (ZOFRAN) IV   Objective:   Vitals:   11/15/23 0800 11/15/23 0840 11/15/23 0845 11/15/23 0900  BP: (!) 116/55   (!) 114/58  Pulse: 93   94  Resp:    16  Temp:      TempSrc:      SpO2: 95% 95% 97%   Weight:      Height:        Intake/Output Summary (Last 24 hours) at 11/15/2023 1025 Last data filed at 11/15/2023 0533 Gross per 24 hour  Intake --  Output 700 ml  Net -700 ml   Filed Weights   11/14/23 1938  Weight: 99.3 kg     Physical examination:   Constitution:  Alert, cooperative, no distress,  Appears calm and comfortable  Psychiatric:   Normal and stable mood and affect, cognition intact,   HEENT:       NG tube in place to wall suction  normocephalic, PERRL, otherwise with in Normal limits  Chest:         Chest symmetric Cardio vascular: Tachyarrhythmia S1/S2, RRR, No murmure, No Rubs or Gallops  pulmonary: Clear to auscultation bilaterally, respirations unlabored, negative wheezes / crackles Abdomen: Soft, non-tender,mildly distended, bowel sounds,no masses, no organomegaly Hypoactive bowel sounds Muscular skeletal: Limited exam - in bed, able to move all 4 extremities,   Neuro: CNII-XII intact. , normal motor and sensation, reflexes intact  Extremities: No pitting edema lower extremities, +2 pulses  Skin: Dry, warm to touch, negative  for any Rashes, No open wounds Wounds: per nursing documentation   ------------------------------------------------------------------------------------------------------------------------------------------    LABs:     Latest Ref Rng & Units 11/15/2023    5:29 AM 11/14/2023    8:30 PM 01/18/2023   11:37 AM  CBC  WBC 4.0 - 10.5 K/uL 8.6  11.0    Hemoglobin 13.0 - 17.0 g/dL 91.4  78.2  95.6   Hematocrit 39.0 - 52.0 % 41.7  38.7  41.0   Platelets 150 - 400 K/uL 216  216        Latest Ref Rng & Units 11/15/2023    5:29 AM 11/14/2023    8:30 PM 01/18/2023   11:37 AM  CMP  Glucose 70 - 99 mg/dL 213  086  86   BUN 8 - 23 mg/dL 23  22  21    Creatinine 0.61 - 1.24 mg/dL 5.78  4.69  6.29   Sodium 135 -  145 mmol/L 134  133  138   Potassium 3.5 - 5.1 mmol/L 4.6  4.5  5.0   Chloride 98 - 111 mmol/L 101  102  102   CO2 22 - 32 mmol/L 21  19    Calcium 8.9 - 10.3 mg/dL 9.3  9.0    Total Protein 6.5 - 8.1 g/dL 7.8  7.3    Total Bilirubin 0.0 - 1.2 mg/dL 0.9  0.6    Alkaline Phos 38 - 126 U/L 96  87    AST 15 - 41 U/L 43  39    ALT 0 - 44 U/L 72  75         Micro Results Recent Results (from the past 240 hours)  Blood Culture (routine x 2)     Status: None (Preliminary result)   Collection Time: 11/14/23  8:09 PM   Specimen: BLOOD LEFT FOREARM  Result Value Ref Range Status   Specimen Description BLOOD LEFT FOREARM  Final   Special Requests   Final    BOTTLES DRAWN AEROBIC AND ANAEROBIC Blood Culture adequate volume   Culture   Final    NO GROWTH < 12 HOURS Performed at [facility], [address] [place], [place] [address]    Report Status PENDING  Incomplete  Blood Culture (routine x 2)     Status: None (Preliminary result)   Collection Time: 11/14/23  8:30 PM   Specimen: BLOOD LEFT HAND  Result Value Ref Range Status   Specimen Description   Final    BLOOD LEFT HAND BOTTLES DRAWN AEROBIC AND ANAEROBIC   Special Requests Blood Culture adequate volume  Final   Culture   Final     NO GROWTH < 12 HOURS Performed at [facility], [address] [place], [place] [address]    Report Status PENDING  Incomplete    Radiology Reports DG Abd Portable 1 View Result Date: 11/15/2023 CLINICAL DATA:  NG tube EXAM: PORTABLE ABDOMEN - 1 VIEW COMPARISON:  CT abdomen and pelvis 11/14/2023 FINDINGS: Enteric tube tip in the stomach with side port likely in the distal esophagus. Recommend advancement 3-4 cm to ensure side port is in the stomach. Remainder unchanged from recent CT abdomen and pelvis. IMPRESSION: 1. Enteric tube tip in the stomach with side port likely in the distal esophagus. Recommend advancement 3-4 cm to ensure side port is in the stomach. Electronically Signed   By: Minerva Fester M.D.   On: 11/15/2023 01:29   CT ABDOMEN PELVIS W CONTRAST Result Date: 11/14/2023 CLINICAL DATA:  Lower abdominal pain and dizziness. EXAM: CT ABDOMEN AND PELVIS WITH CONTRAST TECHNIQUE: Multidetector CT imaging of the abdomen and pelvis was performed using the standard protocol following bolus administration of intravenous contrast. RADIATION DOSE REDUCTION: This exam was performed according to the departmental dose-optimization program which includes automated exposure control, adjustment of the mA and/or kV according to patient size and/or use of iterative reconstruction technique. CONTRAST:  80mL OMNIPAQUE IOHEXOL 300 MG/ML  SOLN COMPARISON:  January 02, 2023 FINDINGS: Lower chest: There is mild, pleural base calcification within the bilateral lung bases. Hepatobiliary: Stable simple hepatic cysts are seen. No gallstones, gallbladder wall thickening, or biliary dilatation. Pancreas: Unremarkable. No pancreatic ductal dilatation or surrounding inflammatory changes. Spleen: Normal in size without focal abnormality. Adrenals/Urinary Tract: The right adrenal gland is unremarkable. 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 This area demonstrated no  evidence of hypermetabolic activity on the prior  PET CT (dated April 12, 2022). Kidneys are normal in size, without obstructing renal calculi or hydronephrosis. Small bilateral simple renal cysts are present. Bladder is unremarkable. Stomach/Bowel: Stomach is within normal limits. The appendix is surgically absent. Numerous dilated loops of jejunum and ileum are seen throughout the abdomen and pelvis (maximum small bowel diameter of approximately 3.0 cm). An abrupt transition zone is seen along an area of mesenteric twisting within the mid right abdomen, within the region anterior to the right kidney (axial CT images 39 through 59, CT series 2). Vascular/Lymphatic: Aortic atherosclerosis. No enlarged abdominal or pelvic lymph nodes. Reproductive: Prostate is unremarkable. Other: No abdominal wall hernia or abnormality. No abdominopelvic ascites. Musculoskeletal: No acute or significant osseous findings. IMPRESSION: 1. Small bowel obstruction with an abrupt transition zone along an area of mesenteric twisting within the mid right abdomen. 2. Stable simple hepatic and bilateral renal cysts. 3. Stable left adrenal mass, as described above. 4. Aortic atherosclerosis. Aortic Atherosclerosis (ICD10-I70.0). Electronically Signed   By: Aram Candela M.D.   On: 11/14/2023 23:45    SIGNED: Kendell Bane, MD, FHM. FAAFP. [facility] - [facility] Time spent - 55 min.  In seeing, evaluating and examining the patient. Reviewing medical records, labs, drawn plan of care. [facility],  Pager (please use [email or web address] to page/ text) Please use Epic Secure Chat for non-urgent communication (7AM-7PM)  If 7PM-7AM, please contact night-coverage [email or web address], 11/15/2023, 10:25 AM

## 2023-11-15 NOTE — ED Notes (Signed)
 Bladder scan = 483

## 2023-11-15 NOTE — Progress Notes (Signed)
 [facility]  Reviewed CT Tonight. On axial and much less on coronal I see the swirling of the bowel into the right upper quadrant where it looks like ileum flips up over the liver and twist down as it comes back to the cecum. Very similar appearance with twisting of mesentery April 2024 but less noticeable as this was a CTA study and no obstruction then. The bowel has the similar configuration too.   It does look like there is twisting of the mesentery but no vascular compromise or ischemia to the intestines. Lab work reassuring like normal lactic and no high leukocytosis. I do think he will likely need explored but not emergently tonight as this looks like real twist from something like internal hernia or volvulus of small bowel.  He is on Eliquis and I assume took it 3/5.   Will need to let this wash out. Admit medicine Monitor patient exam NG HAS to be placed  NPO Hold eliquis  Will see tomorrow. Likely surgery Friday.   Algis Greenhouse, MD [facility] [address] [place], [place] [address] [phone] (office)

## 2023-11-15 NOTE — ED Notes (Signed)
 Xray called to confirm placement

## 2023-11-15 NOTE — Progress Notes (Signed)
 Pulse elevated .  Contacted Dr. Haroldine Laws and he increased scheduled IV metoprolol.  NG connected to LIS and not much output.  Gave zofran earlier for nausea.  Has not voided since arrival to floor.

## 2023-11-16 ENCOUNTER — Other Ambulatory Visit: Payer: Self-pay

## 2023-11-16 ENCOUNTER — Inpatient Hospital Stay (HOSPITAL_COMMUNITY): Payer: Self-pay | Admitting: Certified Registered Nurse Anesthetist

## 2023-11-16 ENCOUNTER — Encounter (HOSPITAL_COMMUNITY)
Admission: EM | Disposition: A | Discharge status: Home / Self Care | Payer: Self-pay | Source: Home / Self Care | Attending: Family Medicine

## 2023-11-16 ENCOUNTER — Inpatient Hospital Stay (HOSPITAL_COMMUNITY)

## 2023-11-16 ENCOUNTER — Encounter (HOSPITAL_COMMUNITY): Payer: Self-pay | Admitting: Internal Medicine

## 2023-11-16 DIAGNOSIS — J449 Chronic obstructive pulmonary disease, unspecified: Secondary | ICD-10-CM | POA: Diagnosis not present

## 2023-11-16 DIAGNOSIS — F17210 Nicotine dependence, cigarettes, uncomplicated: Secondary | ICD-10-CM

## 2023-11-16 DIAGNOSIS — K562 Volvulus: Principal | ICD-10-CM

## 2023-11-16 DIAGNOSIS — K56609 Unspecified intestinal obstruction, unspecified as to partial versus complete obstruction: Secondary | ICD-10-CM | POA: Diagnosis not present

## 2023-11-16 DIAGNOSIS — I10 Essential (primary) hypertension: Secondary | ICD-10-CM

## 2023-11-16 HISTORY — PX: LAPAROTOMY: SHX154

## 2023-11-16 LAB — CBC
HCT: 42.8 % (ref 39.0–52.0)
Hemoglobin: 14.2 g/dL (ref 13.0–17.0)
MCH: 32.1 pg (ref 26.0–34.0)
MCHC: 33.2 g/dL (ref 30.0–36.0)
MCV: 96.6 fL (ref 80.0–100.0)
Platelets: 231 10*3/uL (ref 150–400)
RBC: 4.43 MIL/uL (ref 4.22–5.81)
RDW: 14 % (ref 11.5–15.5)
WBC: 8.2 10*3/uL (ref 4.0–10.5)
nRBC: 0 % (ref 0.0–0.2)

## 2023-11-16 LAB — SURGICAL PCR SCREEN
MRSA, PCR: NEGATIVE
Staphylococcus aureus: NEGATIVE

## 2023-11-16 LAB — BASIC METABOLIC PANEL
Anion gap: 7 (ref 5–15)
BUN: 28 mg/dL — ABNORMAL HIGH (ref 8–23)
CO2: 22 mmol/L (ref 22–32)
Calcium: 8.5 mg/dL — ABNORMAL LOW (ref 8.9–10.3)
Chloride: 108 mmol/L (ref 98–111)
Creatinine, Ser: 1.36 mg/dL — ABNORMAL HIGH (ref 0.61–1.24)
GFR, Estimated: 54 mL/min — ABNORMAL LOW (ref >60)
Glucose, Bld: 113 mg/dL — ABNORMAL HIGH (ref 70–99)
Potassium: 4.2 mmol/L (ref 3.5–5.1)
Sodium: 137 mmol/L (ref 135–145)

## 2023-11-16 SURGERY — LAPAROTOMY, EXPLORATORY
Anesthesia: General | Site: Abdomen

## 2023-11-16 MED ORDER — LABETALOL HCL 5 MG/ML IV SOLN
INTRAVENOUS | Status: DC | PRN
  Administered 2023-11-16: 5 mg via INTRAVENOUS

## 2023-11-16 MED ORDER — LACTATED RINGERS IV SOLN: INTRAVENOUS | Status: DC | PRN

## 2023-11-16 MED ORDER — FENTANYL CITRATE PF 50 MCG/ML IJ SOSY
PREFILLED_SYRINGE | INTRAMUSCULAR | Status: AC
  Filled 2023-11-16: qty 1

## 2023-11-16 MED ORDER — HYDROMORPHONE HCL 1 MG/ML IJ SOLN
0.5000 mg | INTRAMUSCULAR | Status: DC | PRN
Start: 2023-11-16 — End: 2023-11-19
  Administered 2023-11-16 – 2023-11-19 (×13): 1 mg via INTRAVENOUS
  Filled 2023-11-16 (×13): qty 1

## 2023-11-16 MED ORDER — OXYCODONE HCL 5 MG PO TABS: 5.0000 mg | ORAL_TABLET | Freq: Once | ORAL | Status: DC | PRN

## 2023-11-16 MED ORDER — ALBUTEROL SULFATE HFA 108 (90 BASE) MCG/ACT IN AERS
INHALATION_SPRAY | RESPIRATORY_TRACT | Status: DC | PRN
  Administered 2023-11-16: 2 via RESPIRATORY_TRACT

## 2023-11-16 MED ORDER — PHENYLEPHRINE HCL-NACL 20-0.9 MG/250ML-% IV SOLN
INTRAVENOUS | Status: AC
  Filled 2023-11-16: qty 250

## 2023-11-16 MED ORDER — KETAMINE HCL 50 MG/5ML IJ SOSY
PREFILLED_SYRINGE | INTRAMUSCULAR | Status: AC
  Filled 2023-11-16: qty 5

## 2023-11-16 MED ORDER — SODIUM CHLORIDE 0.9 % IR SOLN
Status: DC | PRN
  Administered 2023-11-16: 1000 mL

## 2023-11-16 MED ORDER — OXYCODONE HCL 5 MG/5ML PO SOLN: 5.0000 mg | Freq: Once | ORAL | Status: DC | PRN

## 2023-11-16 MED ORDER — ONDANSETRON HCL 4 MG/2ML IJ SOLN
INTRAMUSCULAR | Status: DC | PRN
  Administered 2023-11-16: 4 mg via INTRAVENOUS

## 2023-11-16 MED ORDER — SUCCINYLCHOLINE CHLORIDE 200 MG/10ML IV SOSY
PREFILLED_SYRINGE | INTRAVENOUS | Status: DC | PRN
  Administered 2023-11-16: 200 mg via INTRAVENOUS

## 2023-11-16 MED ORDER — FENTANYL CITRATE (PF) 250 MCG/5ML IJ SOLN
INTRAMUSCULAR | Status: AC
  Filled 2023-11-16: qty 5

## 2023-11-16 MED ORDER — PROPOFOL 10 MG/ML IV BOLUS
INTRAVENOUS | Status: DC | PRN
  Administered 2023-11-16: 120 mg via INTRAVENOUS

## 2023-11-16 MED ORDER — FENTANYL CITRATE (PF) 100 MCG/2ML IJ SOLN
INTRAMUSCULAR | Status: DC | PRN
  Administered 2023-11-16 (×3): 50 ug via INTRAVENOUS
  Administered 2023-11-16: 100 ug via INTRAVENOUS

## 2023-11-16 MED ORDER — SODIUM CHLORIDE 0.9 % IV SOLN: INTRAVENOUS | Status: DC

## 2023-11-16 MED ORDER — BUPIVACAINE HCL (PF) 0.5 % IJ SOLN
INTRAMUSCULAR | Status: AC
  Filled 2023-11-16: qty 30

## 2023-11-16 MED ORDER — SUGAMMADEX SODIUM 200 MG/2ML IV SOLN
INTRAVENOUS | Status: DC | PRN
  Administered 2023-11-16: 100 mg via INTRAVENOUS
  Administered 2023-11-16: 200 mg via INTRAVENOUS

## 2023-11-16 MED ORDER — HYDROMORPHONE HCL 1 MG/ML IJ SOLN
INTRAMUSCULAR | Status: DC | PRN
  Administered 2023-11-16: .5 mg via INTRAVENOUS

## 2023-11-16 MED ORDER — FENTANYL CITRATE PF 50 MCG/ML IJ SOSY
25.0000 ug | PREFILLED_SYRINGE | INTRAMUSCULAR | Status: DC | PRN
Start: 2023-11-16 — End: 2023-11-16
  Administered 2023-11-16 (×2): 50 ug via INTRAVENOUS
  Filled 2023-11-16: qty 1

## 2023-11-16 MED ORDER — DEXAMETHASONE SODIUM PHOSPHATE 10 MG/ML IJ SOLN
INTRAMUSCULAR | Status: DC | PRN
  Administered 2023-11-16: 10 mg via INTRAVENOUS

## 2023-11-16 MED ORDER — KETAMINE HCL 50 MG/5ML IJ SOSY
PREFILLED_SYRINGE | INTRAMUSCULAR | Status: DC | PRN
  Administered 2023-11-16: 20 mg via INTRAVENOUS

## 2023-11-16 MED ORDER — BUPIVACAINE HCL (PF) 0.5 % IJ SOLN
INTRAMUSCULAR | Status: DC | PRN
  Administered 2023-11-16: 30 mL

## 2023-11-16 MED ORDER — ONDANSETRON HCL 4 MG/2ML IJ SOLN: 4.0000 mg | Freq: Once | INTRAMUSCULAR | Status: DC | PRN

## 2023-11-16 MED ORDER — OXYCODONE HCL 5 MG PO TABS
ORAL_TABLET | ORAL | Status: AC
  Filled 2023-11-16: qty 1

## 2023-11-16 MED ORDER — LIDOCAINE HCL (CARDIAC) PF 100 MG/5ML IV SOSY
PREFILLED_SYRINGE | INTRAVENOUS | Status: DC | PRN
  Administered 2023-11-16: 60 mg via INTRATRACHEAL

## 2023-11-16 MED ORDER — ROCURONIUM BROMIDE 10 MG/ML (PF) SYRINGE
PREFILLED_SYRINGE | INTRAVENOUS | Status: DC | PRN
  Administered 2023-11-16: 50 mg via INTRAVENOUS

## 2023-11-16 MED ORDER — PROPOFOL 10 MG/ML IV BOLUS
INTRAVENOUS | Status: AC
  Filled 2023-11-16: qty 20

## 2023-11-16 SURGICAL SUPPLY — 35 items
CHLORAPREP W/TINT 26 (MISCELLANEOUS) ×1 IMPLANT
CLOTH BEACON ORANGE TIMEOUT ST (SAFETY) ×1 IMPLANT
COVER LIGHT HANDLE STERIS (MISCELLANEOUS) ×2 IMPLANT
DRAPE WARM FLUID 44X44 (DRAPES) ×1 IMPLANT
DRSG OPSITE POSTOP 4X10 (GAUZE/BANDAGES/DRESSINGS) IMPLANT
ELECT REM PT RETURN 9FT ADLT (ELECTROSURGICAL) ×1 IMPLANT
ELECTRODE REM PT RTRN 9FT ADLT (ELECTROSURGICAL) ×1 IMPLANT
GLOVE BIO SURGEON STRL SZ 6.5 (GLOVE) ×2 IMPLANT
GLOVE BIOGEL PI IND STRL 6.5 (GLOVE) ×1 IMPLANT
GLOVE BIOGEL PI IND STRL 7.0 (GLOVE) ×3 IMPLANT
GOWN STRL REUS W/TWL LRG LVL3 (GOWN DISPOSABLE) ×3 IMPLANT
HANDLE SUCTION POOLE (INSTRUMENTS) ×1 IMPLANT
INST SET MAJOR GENERAL (KITS) ×1 IMPLANT
KIT TURNOVER KIT A (KITS) ×1 IMPLANT
MANIFOLD NEPTUNE II (INSTRUMENTS) ×1 IMPLANT
NDL HYPO 18GX1.5 BLUNT FILL (NEEDLE) ×1 IMPLANT
NDL HYPO 21X1.5 SAFETY (NEEDLE) ×1 IMPLANT
NEEDLE HYPO 18GX1.5 BLUNT FILL (NEEDLE) ×1 IMPLANT
NEEDLE HYPO 21X1.5 SAFETY (NEEDLE) ×1 IMPLANT
NS IRRIG 1000ML POUR BTL (IV SOLUTION) ×2 IMPLANT
PACK MAJOR ABDOMINAL (CUSTOM PROCEDURE TRAY) ×1 IMPLANT
PAD ARMBOARD 7.5X6 YLW CONV (MISCELLANEOUS) ×1 IMPLANT
PENCIL SMOKE EVACUATOR COATED (MISCELLANEOUS) ×1 IMPLANT
POSITIONER HEAD 8X9X4 ADT (SOFTGOODS) ×1 IMPLANT
RETRACTOR WND ALEXIS-O 25 LRG (MISCELLANEOUS) IMPLANT
RETRACTOR WOUND ALXS 25CM LRG (MISCELLANEOUS) ×1 IMPLANT
RTRCTR WOUND ALEXIS O 25CM LRG (MISCELLANEOUS) ×1 IMPLANT
SET BASIN LINEN APH (SET/KITS/TRAYS/PACK) ×1 IMPLANT
SPONGE T-LAP 18X18 ~~LOC~~+RFID (SPONGE) ×1 IMPLANT
STAPLER VISISTAT (STAPLE) ×1 IMPLANT
SUCTION POOLE HANDLE (INSTRUMENTS) ×1 IMPLANT
SUT PDS AB CT VIOLET #0 27IN (SUTURE) ×2 IMPLANT
SUT SILK 3 0 SH CR/8 (SUTURE) ×1 IMPLANT
SYR 30ML LL (SYRINGE) ×2 IMPLANT
TRAY FOLEY MTR SLVR 16FR STAT (SET/KITS/TRAYS/PACK) ×1 IMPLANT

## 2023-11-16 NOTE — Progress Notes (Signed)
 PROGRESS NOTE    Patient: Samuel Ferrell                            PCP: Gaspar Garbe, MD                    DOB: September 05, 1946            DOA: 11/14/2023 [id number]             DOS: 11/16/2023, 10:51 AM   LOS: 1 day   Date of Service: The patient was seen and examined on 11/16/2023  Subjective:   POD #0  The patient was seen and examined.  Post abdominal surgery Somnolent Hemodynamically stable,   Brief Narrative:   Samuel Ferrell is a 78 y.o. male with medical history significant of hypertension, hyperlipidemia, COPD, A-Fib status post ablation x 2, tobacco abuse who presents to the emergency department due to Abdominal pain which started about 5 hours PTA, this was associated with nausea without vomiting.  Pain was diffuse and severe in nature.     ED Course:  BP was 70/40 on arrival to the ED, but other vital signs were within normal range. Normal CBC except for WBC of 11.0, hematocrit 38.7.  BMP showed Sodium 133, Potassium 4.5, chloride 102, bicarb 19, blood glucose 129, BUN 22, creatinine 1.63 (baseline creatinine at 1.2 - 1.3), ALT 75.  Blood culture pending.  CT abdomen and pelvis with contrast showed small bowel obstruction with an abrupt transition zone along an area of mesenteric twisting within the mid right abdomen. General surgery, Dr. Henreitta Leber was consulted and will follow-up on patient in the morning with plan to possibly do surgery on Friday (3/7).    Assessment & Plan:   Principal Problem:   Small bowel obstruction (HCC) Active Problems:   COPD (chronic obstructive pulmonary disease) (HCC)   AKI (acute kidney injury) (HCC)   Essential hypertension   Mixed hyperlipidemia   Atrial fibrillation, chronic (HCC)    Assessment/Plan   Small bowel obstruction -valvulitis  11/16/2023, postop day 0 S/p small bowel volvulus reduction, no resection was needed NG tube will remain in place, will keep n.p.o. until bowel function returns per surgery  recommendations Continue abdominal binder  - CT abdominal pelvis was suggestive of small bowel obstruction - Continue NG tube  - Continue NPO   Continue IV hydration Continue IV Dilaudid 0.5 mg q.3h p.r.n. for moderate/severe pain Continue Zofran p.r.n. for nausea/vomiting General Surgery (Dr. Henreitta Leber)   - Holding Eliquis    Acute kidney injury Creatinine 1.63 >> 1.36   (baseline creatinine at 1.2 - 1.3) Continue gentle hydration Renally adjust medications, avoid nephrotoxic agents/dehydration/hypotension   Essential hypertension Stable  Continue IV hydralazine 10 mg every 6 hours as needed for SBP > 170 Consider resuming home meds when patient resumes oral intake   Mixed hyperlipidemia Holding p.o. meds    Chronic A-fib Continue IV metoprolol 2.5 mg every 6 hours as needed for HR > 110 -N.p.o., holding p.o. meds for now   COPD Continue Brovana and Incruse Ellipta ----------------------------------------------------------------------------------------------------------------------------------------------- Nutritional status:  The patient's BMI is: Body mass index is 28.88 kg/m. I agree with the assessment and plan as outlined------------------------------------------------------------------------------------------------------------------------------------------------  DVT prophylaxis:  SCD's Start: 11/15/23 2115 SCDs Start: 11/15/23 0435   Code Status:   Code Status: Full Code  Family Communication: No family member present at bedside- attempt will be made to update daily The  above findings and plan of care has been discussed with patient (and family)  in detail,  they expressed understanding and agreement of above. -Advance care planning has been discussed.   Admission status:   Status is: Inpatient Remains inpatient appropriate because: Needing close observation, n.p.o., NG tube in place, management for small bowel obstruction, surgical  evaluation   Disposition: From  - home             Planning for discharge in 1-2 days: to   Procedures:   No admission procedures for hospital encounter.   Antimicrobials:  Anti-infectives (From admission, onward)    Start     Dose/Rate Route Frequency Ordered Stop   11/16/23 0600  cefoTEtan (CEFOTAN) 2 g in sodium chloride 0.9 % 100 mL IVPB        2 g 200 mL/hr over 30 Minutes Intravenous On call to O.R. 11/15/23 2114 11/16/23 0913        Medication:   [GUY Hold] arformoterol  15 mcg Nebulization BID   And   [MAR Hold] umeclidinium bromide  1 puff Inhalation Daily   Chlorhexidine Gluconate Cloth  6 each Topical Once   [MAR Hold] docusate  200 mg Oral Daily   [MAR Hold] metoprolol tartrate  2.5 mg Intravenous Q4H    [MAR Hold] acetaminophen **OR** [MAR Hold] acetaminophen, bupivacaine(PF), [MAR Hold] chlorproMAZINE (THORAZINE) 25 mg in sodium chloride 0.9 % 25 mL IVPB, fentaNYL (SUBLIMAZE) injection, [MAR Hold]  HYDROmorphone (DILAUDID) injection, [MAR Hold] metoCLOPramide (REGLAN) injection, [MAR Hold] ondansetron **OR** [MAR Hold] ondansetron (ZOFRAN) IV, ondansetron (ZOFRAN) IV, oxyCODONE **OR** oxyCODONE, [MAR Hold] phenol, sodium chloride irrigation   Objective:   Vitals:   11/16/23 0500 11/16/23 0730 11/16/23 0735 11/16/23 1011  BP: (!) 175/77 (!) 142/76    Pulse:  96    Resp: (!) 22 14  18   Temp: 98 F (36.7 C) 97.6 F (36.4 C)  97.8 F (36.6 C)  TempSrc:  Oral    SpO2: 95% 98%    Weight:   99.3 kg   Height:   6\' 1"  (1.854 m)     Intake/Output Summary (Last 24 hours) at 11/16/2023 1051 Last data filed at 11/16/2023 1011 Gross per 24 hour  Intake 2923.06 ml  Output 2465 ml  Net 458.06 ml   Filed Weights   11/14/23 1938 11/16/23 0735  Weight: 99.3 kg 99.3 kg     Physical examination:        General:  Somnolent postop  HEENT:  Normocephalic, PERRL, otherwise with in Normal limits   Neuro:  CNII-XII intact. , normal motor and sensation, reflexes  intact   Lungs:   Clear to auscultation BL, Respirations unlabored,  No wheezes / crackles  Cardio:    S1/S2, RRR, No murmure, No Rubs or Gallops   Abdomen:  Abdominal wall postsurgical wound, dressing in place Soft, hypoactive bowel sounds, no guarding or peritoneal signs.  Muscular  skeletal:  Limited exam -global generalized weaknesses - in bed, able to move all 4 extremities,   2+ pulses,  symmetric, No pitting edema  Skin:  Dry, warm to touch, negative for any Rashes,  Wounds: Please see nursing documentation          ------------------------------------------------------------------------------------------------------------------------------------------    LABs:     Latest Ref Rng & Units 11/16/2023    3:36 AM 11/15/2023    5:29 AM 11/14/2023    8:30 PM  CBC  WBC 4.0 - 10.5 K/uL 8.2  8.6  11.0  Hemoglobin 13.0 - 17.0 g/dL 16.1  09.6  04.5   Hematocrit 39.0 - 52.0 % 42.8  41.7  38.7   Platelets 150 - 400 K/uL 231  216  216       Latest Ref Rng & Units 11/16/2023    3:36 AM 11/15/2023    5:29 AM 11/14/2023    8:30 PM  CMP  Glucose 70 - 99 mg/dL 409  811  914   BUN 8 - 23 mg/dL 28  23  22    Creatinine 0.61 - 1.24 mg/dL 7.82  9.56  2.13   Sodium 135 - 145 mmol/L 137  134  133   Potassium 3.5 - 5.1 mmol/L 4.2  4.6  4.5   Chloride 98 - 111 mmol/L 108  101  102   CO2 22 - 32 mmol/L 22  21  19    Calcium 8.9 - 10.3 mg/dL 8.5  9.3  9.0   Total Protein 6.5 - 8.1 g/dL  7.8  7.3   Total Bilirubin 0.0 - 1.2 mg/dL  0.9  0.6   Alkaline Phos 38 - 126 U/L  96  87   AST 15 - 41 U/L  43  39   ALT 0 - 44 U/L  72  75        Micro Results Recent Results (from the past 240 hours)  Blood Culture (routine x 2)     Status: None (Preliminary result)   Collection Time: 11/14/23  8:09 PM   Specimen: BLOOD LEFT FOREARM  Result Value Ref Range Status   Specimen Description BLOOD LEFT FOREARM  Final   Special Requests   Final    BOTTLES DRAWN AEROBIC AND ANAEROBIC Blood Culture adequate  volume   Culture   Final    NO GROWTH 2 DAYS Performed at [facility], [address] [place], [place] [address]    Report Status PENDING  Incomplete  Blood Culture (routine x 2)     Status: None (Preliminary result)   Collection Time: 11/14/23  8:30 PM   Specimen: BLOOD LEFT HAND  Result Value Ref Range Status   Specimen Description   Final    BLOOD LEFT HAND BOTTLES DRAWN AEROBIC AND ANAEROBIC   Special Requests Blood Culture adequate volume  Final   Culture   Final    NO GROWTH 2 DAYS Performed at [facility], [address] [place], [place] [address]    Report Status PENDING  Incomplete  Surgical pcr screen     Status: None   Collection Time: 11/15/23 11:25 PM   Specimen: Nasal Mucosa; Nasal Swab  Result Value Ref Range Status   MRSA, PCR NEGATIVE NEGATIVE Final   Staphylococcus aureus NEGATIVE NEGATIVE Final    Comment: (NOTE) The Xpert SA Assay (FDA approved for NASAL specimens in patients 42 years of age and older), is one component of a comprehensive surveillance program. It is not intended to diagnose infection nor to guide or monitor treatment. Performed at [facility], [address], [place], [place] [address]     Radiology Reports No results found.   SIGNED: Kendell Bane, MD, FHM. FAAFP. [facility] - [facility] Time spent - 55 min.  In seeing, evaluating and examining the patient. Reviewing medical records, labs, drawn plan of care. [facility],  Pager (please use [email or web address] to page/ text) Please use Epic Secure Chat for non-urgent communication (7AM-7PM)  If 7PM-7AM, please contact night-coverage [email or web address], 11/16/2023, 10:51 AM

## 2023-11-16 NOTE — Op Note (Signed)
 [facility] Operative Note  11/16/23  Preoperative Diagnosis: Small bowel obstruction   Postoperative Diagnosis: Small bowel obstruction, small bowel volvulus    Procedure(s) Performed: Exploratory laparotomy, reduction of small bowel   Surgeon: Leatrice Jewels. Henreitta Leber, MD   Assistants: No qualified resident was available    Anesthesia: General endotracheal   Anesthesiologist: Dr. Johnnette Litter, MD    Specimens: None    Estimated Blood Loss: Minimal   Blood Replacement: None    Complications: None   Wound Class: Clean    Operative Indications: Samuel Ferrell is a 78 yo with SBO on CT scan and mesenteric twisting concerning for an internal hernia or volvulus. We discussed exploration and risk of resection and risk of bleeding, infection, anastomotic leak, finding something unexpected.   Findings: Volvulus of the distal small bowel in a counterclockwise direction, reduced    Procedure: The patient was taken to the operating room and placed supine. General endotracheal anesthesia was induced. Intravenous antibiotics were  administered per protocol.  An orogastric tube positioned to decompress the stomach. The abdomen was prepped and draped in the usual sterile fashion.   A midline incision was made and carried down to the fascia. The fascia was opened with care, and a wound protector was placed. The small bowel was very dilated and I started to eviscerate it. In the right lower quadrant I could feel the twisting. A retractor was placed, and I could see the small bowel volvulized in the count clockwise direction. There was no adhesion or other lesion causing this. The mesentery of the small bowel is very long and he has very large amount of small bowel. I ran the small bowel and milked back some contents. The NG was replaced with a larger caliber and confirmed intraoperatively. The small bowel was placed into the abdomen in anatomic position without any twisting.   The abdomen was  closed in the standard fashion with 0 PDS sutures. Marcaine was injected. Skin was closed with Staples and honeycomb dressing was placed.   Final inspection revealed acceptable hemostasis. All counts were correct at the end of the case. The patient was awakened from anesthesia and extubated without complication.  The patient went to the PACU in stable condition.   Samuel Greenhouse, MD [facility] [address] [place], [place] [address] [phone] (office)

## 2023-11-16 NOTE — Discharge Instructions (Addendum)
 Discharge Open Abdominal Surgery Instructions:  Common Complaints: Pain at the incision site is common. This will improve with time. Take your pain medications as described below. Some nausea is common and poor appetite. The main goal is to stay hydrated the first few days after surgery.   Diet/ Activity: Diet as tolerated. You have started and tolerated a diet in the hospital, and should continue to increase what you are able to eat.   You may not have a large appetite, but it is important to stay hydrated. Drink 64 ounces of water a day. Your appetite will return with time.  Keep a dry dressing in place over your staples daily or as needed. Some minor pink/ blood tinged drainage is expected. This will stop in a few days after surgery.  Shower per your regular routine daily.  Do not take hot showers.  Wear an abdominal binder daily with activity. You do not have to wear this while sleeping or sitting.  Rest and listen to your body, but do not remain in bed all day.  Walk everyday for at least 15-20 minutes. Deep cough and move around every 1-2 hours in the first few days after surgery.  Do not lift > 10 lbs, perform excessive bending, pushing, pulling, squatting for 6-8 weeks after surgery.  The activity restrictions and the abdominal binder are to prevent hernia formation at your incision while you are healing.  Do not place lotions or balms on your incision unless instructed to specifically by Dr. Henreitta Leber.   Pain Expectations and Narcotics: -After surgery you will have pain associated with your incisions and this is normal. The pain is muscular and nerve pain, and will get better with time. -You are encouraged and expected to take non narcotic medications like tylenol and ibuprofen (when able) to treat pain as multiple modalities can aid with pain treatment. -Narcotics are only used when pain is severe or there is breakthrough pain. -You are not expected to have a pain score of 0 after  surgery, as we cannot prevent pain. A pain score of 3-4 that allows you to be functional, move, walk, and tolerate some activity is the goal. The pain will continue to improve over the days after surgery and is dependent on your surgery. -Due to [place], we are only able to give a certain amount of pain medication to treat post operative pain, and we only give additional narcotics on a patient by patient basis.  -For most laparoscopic surgery, studies have shown that the majority of patients only need 10-15 narcotic pills, and for open surgeries most patients only need 15-20.   -Having appropriate expectations of pain and knowledge of pain management with non narcotics is important as we do not want anyone to become addicted to narcotic pain medication.  -Using ice packs in the first 48 hours and heating pads after 48 hours, wearing an abdominal binder (when recommended), and using over the counter medications are all ways to help with pain management.   -Simple acts like meditation and mindfulness practices after surgery can also help with pain control and research has proven the benefit of these practices.  Medication: Take tylenol and ibuprofen as needed for pain control, alternating every 4-6 hours.  Example:  Tylenol 1000mg  @ 6am, 12noon, 6pm, (Do not exceed 4000mg  of tylenol a day). Ibuprofen 800mg  @ 9am, 3pm, 9pm, 3am (Do not exceed 3600mg  of ibuprofen a day).  Take Roxicodone for breakthrough pain every 4 hours.  Take Colace  for constipation related to narcotic pain medication. If you do not have a bowel movement in 2 days, take Miralax over the counter.  Drink plenty of water to also prevent constipation.   Contact Information: If you have questions or concerns, please call our office, [phone], Monday- Thursday 8AM-5PM and Friday 8AM-12Noon.  If it is after hours or on the weekend, please call [facility], [phone], [phone] and ask to speak to the surgeon on  call for Dr. Henreitta Leber at [facility].

## 2023-11-16 NOTE — Plan of Care (Signed)
   Problem: Education: Goal: Knowledge of General Education information will improve Description Including pain rating scale, medication(s)/side effects and non-pharmacologic comfort measures Outcome: Progressing   Problem: Health Behavior/Discharge Planning: Goal: Ability to manage health-related needs will improve Outcome: Progressing

## 2023-11-16 NOTE — Interval H&P Note (Signed)
 History and Physical Interval Note:  11/16/2023 7:33 AM  Samuel Ferrell  has presented today for surgery, with the diagnosis of small bowel obstruction.  The various methods of treatment have been discussed with the patient and family. After consideration of risks, benefits and other options for treatment, the patient has consented to  Procedure(s): LAPAROTOMY, EXPLORATORY; possible small bowel resection (N/A) as a surgical intervention.  The patient's history has been reviewed, patient examined, no change in status, stable for surgery.  I have reviewed the patient's chart and labs.  Questions were answered to the patient's satisfaction.    Discussed plan. He wants me to call Kennon Rounds or Melvyn Neth. He says he is estranged from his kids who live in [place].   Lucretia Roers

## 2023-11-16 NOTE — Anesthesia Procedure Notes (Signed)
 Procedure Name: Intubation Date/Time: 11/16/2023 9:02 AM  Performed by: Lorin Glass, CRNAPre-anesthesia Checklist: Patient identified, Emergency Drugs available, Suction available and Patient being monitored Patient Re-evaluated:Patient Re-evaluated prior to induction Oxygen Delivery Method: Circle system utilized Preoxygenation: Pre-oxygenation with 100% oxygen Induction Type: IV induction, Rapid sequence and Cricoid Pressure applied Laryngoscope Size: Mac and 4 Grade View: Grade II Tube type: Oral Tube size: 7.5 mm Number of attempts: 1 Airway Equipment and Method: Stylet Placement Confirmation: ETT inserted through vocal cords under direct vision, positive ETCO2 and breath sounds checked- equal and bilateral Secured at: 24 cm Tube secured with: Tape Dental Injury: Teeth and Oropharynx as per pre-operative assessment  Comments: Intubation performed by Audrea Muscat

## 2023-11-16 NOTE — Progress Notes (Signed)
 Attempted to call second contact lewis with no success

## 2023-11-16 NOTE — Progress Notes (Signed)
 [facility]  Patient retaining and had retention last night. Will put in foley and try to get out tomorrow.  Algis Greenhouse, MD [facility] [address] [place], [place] [address] [phone] (office)

## 2023-11-16 NOTE — Progress Notes (Signed)
 [facility]  Small bowel volvulus reduced. No resection.  NG and NPO until bowel function.  PRN for pain Binder  Updated Lewis who will update Kennon Rounds his significant other.  Updated team.   Algis Greenhouse, MD [facility] [address] [place], [place] [address] [phone] (office)

## 2023-11-16 NOTE — Progress Notes (Signed)
 Mobility Specialist Progress Note:    11/16/23 1450  Mobility  Activity Ambulated with assistance in room;Transferred from bed to chair  Level of Assistance Contact guard assist, steadying assist  Assistive Device None  Distance Ambulated (ft) 8 ft  Range of Motion/Exercises Active;All extremities  Activity Response Tolerated well  Mobility Referral Yes  Mobility visit 1 Mobility  Mobility Specialist Start Time (ACUTE ONLY) 1435  Mobility Specialist Stop Time (ACUTE ONLY) 1450  Mobility Specialist Time Calculation (min) (ACUTE ONLY) 15 min   Pt received in bed, agreeable to mobility. Required CGA to stand and ambulate with no AD. Tolerated well, asx throughout. Left pt in chair, call bell in reach. All needs met.   Lawerance Bach Mobility Specialist Please contact via Special educational needs teacher or  Rehab office at [phone]

## 2023-11-16 NOTE — Anesthesia Preprocedure Evaluation (Signed)
 Anesthesia Evaluation  Patient identified by MRN, date of birth, ID band Patient awake    Reviewed: Allergy & Precautions, H&P , NPO status , Patient's Chart, lab work & pertinent test results, reviewed documented beta blocker date and time   Airway Mallampati: II  TM Distance: >3 FB Neck ROM: full    Dental no notable dental hx.    Pulmonary neg pulmonary ROS, pneumonia, COPD, Current Smoker   Pulmonary exam normal breath sounds clear to auscultation       Cardiovascular Exercise Tolerance: Good hypertension, + Peripheral Vascular Disease  negative cardio ROS + Valvular Problems/Murmurs  Rhythm:regular Rate:Normal     Neuro/Psych TIA Neuromuscular disease CVA negative neurological ROS  negative psych ROS   GI/Hepatic negative GI ROS, Neg liver ROS,GERD  ,,  Endo/Other  negative endocrine ROS    Renal/GU Renal diseasenegative Renal ROS  negative genitourinary   Musculoskeletal   Abdominal   Peds  Hematology negative hematology ROS (+) Blood dyscrasia, anemia   Anesthesia Other Findings   Reproductive/Obstetrics negative OB ROS                             Anesthesia Physical Anesthesia Plan  ASA: 3 and emergent  Anesthesia Plan: General and General ETT   Post-op Pain Management:    Induction:   PONV Risk Score and Plan: Ondansetron  Airway Management Planned:   Additional Equipment:   Intra-op Plan:   Post-operative Plan:   Informed Consent: I have reviewed the patients History and Physical, chart, labs and discussed the procedure including the risks, benefits and alternatives for the proposed anesthesia with the patient or authorized representative who has indicated his/her understanding and acceptance.     Dental Advisory Given  Plan Discussed with: CRNA  Anesthesia Plan Comments:        Anesthesia Quick Evaluation

## 2023-11-16 NOTE — Transfer of Care (Signed)
 Immediate Anesthesia Transfer of Care Note  Patient: Samuel Ferrell  Procedure(s) Performed: LAPAROTOMY, EXPLORATORY; REDUCTION OF VOLVULUS (Abdomen)  Patient Location: PACU  Anesthesia Type:General  Level of Consciousness: awake  Airway & Oxygen Therapy: Patient Spontanous Breathing and Patient connected to nasal cannula oxygen  Post-op Assessment: Report given to RN and Post -op Vital signs reviewed and stable  Post vital signs: Reviewed and stable  Last Vitals:  Vitals Value Taken Time  BP 217/105   Temp 97.8   Pulse 100 11/16/23 1010  Resp 18   SpO2 96 % 11/16/23 1010  Vitals shown include unfiled device data.  Last Pain:  Vitals:   11/16/23 0730  TempSrc: Oral  PainSc: 0-No pain         Complications: No notable events documented.

## 2023-11-16 NOTE — Progress Notes (Signed)
 Attempted to call Samuel Ferrell patients significant other x2 with no success, OR team picking up patient at this time,

## 2023-11-16 NOTE — Progress Notes (Signed)
 Patient has had a very uncomfortable shift.  Patient has c/o abdominal cramping and unable to urinate.  Patient was I&O again with an output of 700 cc.  Patient has had minimal output in NG tube.  Patient abdomen still distended, with more firmness on right side.  Pulse has been controlled with administration of lopressor.  Patient was placed on telemetry for h/o a. Fib and administration of lopressor.   Surgical preop list initiated.

## 2023-11-17 DIAGNOSIS — K56609 Unspecified intestinal obstruction, unspecified as to partial versus complete obstruction: Secondary | ICD-10-CM | POA: Diagnosis not present

## 2023-11-17 LAB — BASIC METABOLIC PANEL
Anion gap: 6 (ref 5–15)
BUN: 26 mg/dL — ABNORMAL HIGH (ref 8–23)
CO2: 23 mmol/L (ref 22–32)
Calcium: 8 mg/dL — ABNORMAL LOW (ref 8.9–10.3)
Chloride: 110 mmol/L (ref 98–111)
Creatinine, Ser: 1.35 mg/dL — ABNORMAL HIGH (ref 0.61–1.24)
GFR, Estimated: 54 mL/min — ABNORMAL LOW (ref >60)
Glucose, Bld: 127 mg/dL — ABNORMAL HIGH (ref 70–99)
Potassium: 4.1 mmol/L (ref 3.5–5.1)
Sodium: 139 mmol/L (ref 135–145)

## 2023-11-17 LAB — CBC
HCT: 41.2 % (ref 39.0–52.0)
Hemoglobin: 13.4 g/dL (ref 13.0–17.0)
MCH: 31.7 pg (ref 26.0–34.0)
MCHC: 32.5 g/dL (ref 30.0–36.0)
MCV: 97.4 fL (ref 80.0–100.0)
Platelets: 235 10*3/uL (ref 150–400)
RBC: 4.23 MIL/uL (ref 4.22–5.81)
RDW: 14.1 % (ref 11.5–15.5)
WBC: 7 10*3/uL (ref 4.0–10.5)
nRBC: 0 % (ref 0.0–0.2)

## 2023-11-17 LAB — MAGNESIUM: Magnesium: 2.5 mg/dL — ABNORMAL HIGH (ref 1.7–2.4)

## 2023-11-17 LAB — PHOSPHORUS: Phosphorus: 2.9 mg/dL (ref 2.5–4.6)

## 2023-11-17 MED ORDER — DEXTROSE-SODIUM CHLORIDE 5-0.45 % IV SOLN
INTRAVENOUS | Status: AC
Start: 2023-11-17 — End: 2023-11-17

## 2023-11-17 MED ORDER — BISACODYL 10 MG RE SUPP
10.0000 mg | Freq: Every day | RECTAL | Status: DC
  Administered 2023-11-17 – 2023-11-19 (×3): 10 mg via RECTAL
  Filled 2023-11-17 (×5): qty 1

## 2023-11-17 MED ORDER — TAMSULOSIN HCL 0.4 MG PO CAPS
0.4000 mg | ORAL_CAPSULE | Freq: Every day | ORAL | Status: DC
  Administered 2023-11-17: 0.4 mg via ORAL
  Filled 2023-11-17 (×2): qty 1

## 2023-11-17 NOTE — Progress Notes (Signed)
 [facility] Progress Note  1 Day Post-Op  Subjective: Patient seen and examined.  He is resting comfortably in bed.  His NG tube remains in place with 950 cc of bilious output.  He denies any flatus or bowel movement since surgery.  He has some abdominal pain, but otherwise no significant complaints.  No nausea or vomiting.  Objective: Vital signs in last 24 hours: Temp:  [97.9 F (36.6 C)-98.2 F (36.8 C)] 98.1 F (36.7 C) (03/08 0453) Pulse Rate:  [94-101] 100 (03/08 0453) Resp:  [16-18] 18 (03/08 0453) BP: (130-176)/(79-84) 149/79 (03/08 0453) SpO2:  [92 %-99 %] 96 % (03/08 0956) Last BM Date : 11/14/23  Intake/Output from previous day: 03/07 0701 - 03/08 0700 In: 1098.2 [I.V.:908.2; NG/GT:90; IV Piggyback:100] Out: 1765 [Urine:800; Emesis/NG output:950; Blood:15] Intake/Output this shift: Total I/O In: 60 [NG/GT:60] Out: -   General appearance: alert, cooperative, and no distress Nose: NG tube in place with bilious content in canister GI: Abdomen soft, moderate distention, no percussion tenderness, minimal incisional tenderness to palpation; no rigidity, guarding, rebound tenderness; midline incision C/D/I with honeycomb dressing in place  Lab Results:  Recent Labs    11/16/23 0336 11/17/23 0404  WBC 8.2 7.0  HGB 14.2 13.4  HCT 42.8 41.2  PLT 231 235   BMET Recent Labs    11/16/23 0336 11/17/23 0404  NA 137 139  K 4.2 4.1  CL 108 110  CO2 22 23  GLUCOSE 113* 127*  BUN 28* 26*  CREATININE 1.36* 1.35*  CALCIUM 8.5* 8.0*   PT/INR Recent Labs    11/14/23 2030  LABPROT 15.4*  INR 1.2    Studies/Results: DG Chest Port 1 View Result Date: 11/16/2023 CLINICAL DATA:  NG tube placement EXAM: PORTABLE CHEST 1 VIEW COMPARISON:  X-ray 11/15/2023. FINDINGS: Limited x-ray for tube placement demonstrates enteric tube with tip overlying the fundus of the stomach. Dilated loops of bowel seen in the upper abdomen elsewhere. Film is under penetrated.  There is motion. IMPRESSION: Limited x-ray for tube placement has enteric tube tip overlying the upper stomach. Persistent dilated bowel in the mid abdomen. Electronically Signed   By: Karen Kays M.D.   On: 11/16/2023 13:06    Anti-infectives: Anti-infectives (From admission, onward)    Start     Dose/Rate Route Frequency Ordered Stop   11/16/23 0600  cefoTEtan (CEFOTAN) 2 g in sodium chloride 0.9 % 100 mL IVPB        2 g 200 mL/hr over 30 Minutes Intravenous On call to O.R. 11/15/23 2114 11/17/23 1610       Assessment/Plan:  Patient is a 78 year old male who is status post exploratory laparotomy with reduction of small bowel volvulus on 3/7.  -I discussed with the patient that he had significant dilation of his small intestine at the time of surgery, for this reason, we need him to start having bowel function prior to removing his NG tube -Continue NG to LIS, NPO -IV fluids -Monitor for return of bowel function.  Will order small bowel regimen for the patient -PRN pain control and antiemetics -Encourage incentive spirometry and ambulation.  Patient's NG tube can be clamped for ambulation -Abdominal binder in place -Appreciate hospitalist recommendations   LOS: 2 days    Marvelle Span A Shunna Mikaelian 11/17/2023  Note: Portions of this report may have been transcribed using voice recognition software. Every effort has been made to ensure accuracy; however, inadvertent computerized transcription errors may still be present.

## 2023-11-17 NOTE — Plan of Care (Signed)

## 2023-11-17 NOTE — Progress Notes (Signed)
 PROGRESS NOTE    Patient: Samuel Ferrell                            PCP: Gaspar Garbe, MD                    DOB: [birth year]            DOA: 11/14/2023 WJX:[id number]             DOS: 11/17/2023, 10:38 AM   LOS: 2 days   Date of Service: The patient was seen and examined on 11/17/2023  Subjective:   POD #1  The patient was seen and examined this morning, stable Uncomfortable NG tube in place Not reporting any bowel movement or gas yet Denies of having any abdominal pain  Foley catheter had to be placed overnight for urinary retention    Brief Narrative:   Samuel Ferrell is a 78 y.o. male with medical history significant of hypertension, hyperlipidemia, COPD, A-Fib status post ablation x 2, tobacco abuse who presents to the emergency department due to Abdominal pain which started about 5 hours PTA, this was associated with nausea without vomiting.  Pain was diffuse and severe in nature.     ED Course:  BP was 70/40 on arrival to the ED, but other vital signs were within normal range. Normal CBC except for WBC of 11.0, hematocrit 38.7.  BMP showed Sodium 133, Potassium 4.5, chloride 102, bicarb 19, blood glucose 129, BUN 22, creatinine 1.63 (baseline creatinine at 1.2 - 1.3), ALT 75.  Blood culture pending.  CT abdomen and pelvis with contrast showed small bowel obstruction with an abrupt transition zone along an area of mesenteric twisting within the mid right abdomen. General surgery, Dr. Henreitta Leber was consulted and will follow-up on patient in the morning with plan to possibly do surgery on Friday (3/7).    Assessment & Plan:   Principal Problem:   Small bowel obstruction (HCC) Active Problems:   COPD (chronic obstructive pulmonary disease) (HCC)   AKI (acute kidney injury) (HCC)   Essential hypertension   Mixed hyperlipidemia   Atrial fibrillation, chronic (HCC)    Assessment/Plan   Small bowel obstruction -valvulitis  11/16/2023, postop day 1 S/p small bowel volvulus  reduction, no resection was needed NG tube will remain in place, NG tube about 190 cc out -Still n.p.o. -No gas or bowel movement reported yet  until bowel function returns per surgery recommendations Continue abdominal binder  - CT abdominal pelvis was suggestive of small bowel obstruction  Continue IV hydration--changed to D5 half-normal saline Continue IV Dilaudid 0.5 mg q.3h p.r.n. for moderate/severe pain Continue Zofran p.r.n. for nausea/vomiting General Surgery (Dr. Henreitta Leber)   - Holding Eliquis    Acute kidney injury Creatinine 1.63 >> 1.36 >> 1.35   (baseline creatinine at 1.2 - 1.3) Continue gentle hydration Renally adjust medications, avoid nephrotoxic agents/dehydration/hypotension   Essential hypertension -Mildly hypertensive  Continue IV hydralazine 10 mg every 6 hours as needed for SBP > 170 Consider resuming home meds when patient resumes oral intake   Mixed hyperlipidemia Holding p.o. meds    Chronic A-fib Continue IV metoprolol 2.5 mg every 6 hours as needed for HR > 110 -N.p.o., holding p.o. meds for now -Holding Eliquis   COPD Continue Brovana and Incruse Ellipta ----------------------------------------------------------------------------------------------------------------------------------------------- Nutritional status:  The patient's BMI is: Body mass index is 28.88 kg/m. I agree with the assessment and plan as  outlined------------------------------------------------------------------------------------------------------------------------------------------------  DVT prophylaxis:  SCDs Start: 11/15/23 0435   Code Status:   Code Status: Full Code  Family Communication: No family member present at bedside- attempt will be made to update daily The above findings and plan of care has been discussed with patient (and family)  in detail,  they expressed understanding and agreement of above. -Advance care planning has been discussed.   Admission  status:   Status is: Inpatient Remains inpatient appropriate because: Needing close observation, n.p.o., NG tube in place, management for small bowel obstruction, surgical evaluation   Disposition: From  - home             Planning for discharge in 1-2 days: to   Procedures:   No admission procedures for hospital encounter.   Antimicrobials:  Anti-infectives (From admission, onward)    Start     Dose/Rate Route Frequency Ordered Stop   11/16/23 0600  cefoTEtan (CEFOTAN) 2 g in sodium chloride 0.9 % 100 mL IVPB        2 g 200 mL/hr over 30 Minutes Intravenous On call to O.R. 11/15/23 2114 11/17/23 0917        Medication:   arformoterol  15 mcg Nebulization BID   And   umeclidinium bromide  1 puff Inhalation Daily   docusate  200 mg Oral Daily   metoprolol tartrate  2.5 mg Intravenous Q4H   tamsulosin  0.4 mg Oral QPC breakfast    acetaminophen **OR** acetaminophen, chlorproMAZINE (THORAZINE) 25 mg in sodium chloride 0.9 % 25 mL IVPB, HYDROmorphone (DILAUDID) injection, metoCLOPramide (REGLAN) injection, ondansetron **OR** ondansetron (ZOFRAN) IV, phenol   Objective:   Vitals:   11/16/23 2038 11/16/23 2354 11/17/23 0453 11/17/23 0956  BP: 130/80 (!) 176/84 (!) 149/79   Pulse: 94 100 100   Resp: 16 18 18    Temp: 97.9 F (36.6 C) 98 F (36.7 C) 98.1 F (36.7 C)   TempSrc: Oral Oral Oral   SpO2: 92% 98% 95% 96%  Weight:      Height:        Intake/Output Summary (Last 24 hours) at 11/17/2023 1038 Last data filed at 11/17/2023 1000 Gross per 24 hour  Intake 508.16 ml  Output 1600 ml  Net -1091.84 ml   Filed Weights   11/14/23 1938 11/16/23 0735  Weight: 99.3 kg 99.3 kg     Physical examination:        General:  AAO x 3,  cooperative, in distress with NG tube in place  HEENT:  NG tube in place  normocephalic, PERRL, otherwise with in Normal limits   Neuro:  CNII-XII intact. , normal motor and sensation, reflexes intact   Lungs:   Clear to auscultation  BL, Respirations unlabored,  No wheezes / crackles  Cardio:    S1/S2, RRR, No murmure, No Rubs or Gallops   Abdomen:  Abdominal surgical wound with abdominal binder in place  Mild diffuse tenderness, otherwise soft, hypoactive bowel sounds soft, non-tender, bowel sounds active all four quadrants, no guarding or peritoneal signs.  Muscular  skeletal:  Limited exam -global generalized weaknesses - in bed, able to move all 4 extremities,   2+ pulses,  symmetric, No pitting edema  Skin:  Dry, warm to touch,  Abdominal surgical wound with dressing in place,   -Foley catheter in place  Wounds: Abdominal surgical wound and dressing       ------------------------------------------------------------------------------------------------------------------------------------------    LABs:     Latest Ref Rng & Units 11/17/2023  4:04 AM 11/16/2023    3:36 AM 11/15/2023    5:29 AM  CBC  WBC 4.0 - 10.5 K/uL 7.0  8.2  8.6   Hemoglobin 13.0 - 17.0 g/dL 30.8  65.7  84.6   Hematocrit 39.0 - 52.0 % 41.2  42.8  41.7   Platelets 150 - 400 K/uL 235  231  216       Latest Ref Rng & Units 11/17/2023    4:04 AM 11/16/2023    3:36 AM 11/15/2023    5:29 AM  CMP  Glucose 70 - 99 mg/dL 962  952  841   BUN 8 - 23 mg/dL 26  28  23    Creatinine 0.61 - 1.24 mg/dL 3.24  4.01  0.27   Sodium 135 - 145 mmol/L 139  137  134   Potassium 3.5 - 5.1 mmol/L 4.1  4.2  4.6   Chloride 98 - 111 mmol/L 110  108  101   CO2 22 - 32 mmol/L 23  22  21    Calcium 8.9 - 10.3 mg/dL 8.0  8.5  9.3   Total Protein 6.5 - 8.1 g/dL   7.8   Total Bilirubin 0.0 - 1.2 mg/dL   0.9   Alkaline Phos 38 - 126 U/L   96   AST 15 - 41 U/L   43   ALT 0 - 44 U/L   72        Micro Results Recent Results (from the past 240 hours)  Blood Culture (routine x 2)     Status: None (Preliminary result)   Collection Time: 11/14/23  8:09 PM   Specimen: BLOOD LEFT FOREARM  Result Value Ref Range Status   Specimen Description BLOOD LEFT FOREARM  Final    Special Requests   Final    BOTTLES DRAWN AEROBIC AND ANAEROBIC Blood Culture adequate volume   Culture   Final    NO GROWTH 3 DAYS Performed at [facility], [address] [place], [place] [address]    Report Status PENDING  Incomplete  Blood Culture (routine x 2)     Status: None (Preliminary result)   Collection Time: 11/14/23  8:30 PM   Specimen: BLOOD LEFT HAND  Result Value Ref Range Status   Specimen Description   Final    BLOOD LEFT HAND BOTTLES DRAWN AEROBIC AND ANAEROBIC   Special Requests Blood Culture adequate volume  Final   Culture   Final    NO GROWTH 3 DAYS Performed at [facility], [address], [place], [place] [address]    Report Status PENDING  Incomplete  Urine Culture     Status: Abnormal (Preliminary result)   Collection Time: 11/15/23 12:00 PM   Specimen: Urine, Random  Result Value Ref Range Status   Specimen Description   Final    URINE, RANDOM Performed at [facility], [address] [place], [place] [address]    Special Requests   Final    NONE Reflexed from [phone] Performed at [facility], [address] [place], [place] [address]    Culture (A)  Final    >=100,000 COLONIES/mL STAPHYLOCOCCUS AUREUS 30,000 COLONIES/mL STREPTOCOCCUS AGALACTIAE TESTING AGAINST S. AGALACTIAE NOT ROUTINELY PERFORMED DUE TO PREDICTABILITY OF AMP/PEN/VAN SUSCEPTIBILITY. CONFIRMATION OF SUSCEPTIBILITIES IN PROGRESS Performed at [facility], [address] [place], [place] [address]    Report Status PENDING  Incomplete  Surgical pcr screen     Status: None   Collection Time: 11/15/23 11:25 PM  Specimen: Nasal Mucosa; Nasal Swab  Result Value Ref Range Status   MRSA, PCR NEGATIVE NEGATIVE Final   Staphylococcus aureus NEGATIVE NEGATIVE Final    Comment: (NOTE) The Xpert SA Assay (FDA approved for NASAL specimens in patients 60 years of age and older), is one component of a comprehensive surveillance program. It is not intended to diagnose infection  nor to guide or monitor treatment. Performed at [facility], [address], [place], [place] [address]     Radiology Reports DG Chest [place] 1 View Result Date: 11/16/2023 CLINICAL DATA:  NG tube placement EXAM: PORTABLE CHEST 1 VIEW COMPARISON:  X-ray 11/15/2023. FINDINGS: Limited x-ray for tube placement demonstrates enteric tube with tip overlying the fundus of the stomach. Dilated loops of bowel seen in the upper abdomen elsewhere. Film is under penetrated. There is motion. IMPRESSION: Limited x-ray for tube placement has enteric tube tip overlying the upper stomach. Persistent dilated bowel in the mid abdomen. Electronically Signed   By: Karen Kays M.D.   On: 11/16/2023 13:06     SIGNED: Kendell Bane, MD, FHM. FAAFP. [facility] - [facility] Time spent - 55 min.  In seeing, evaluating and examining the patient. Reviewing medical records, labs, drawn plan of care. [facility],  Pager (please use [email or web address] to page/ text) Please use Epic Secure Chat for non-urgent communication (7AM-7PM)  If 7PM-7AM, please contact night-coverage [email or web address], 11/17/2023, 10:38 AM

## 2023-11-18 ENCOUNTER — Inpatient Hospital Stay (HOSPITAL_COMMUNITY)

## 2023-11-18 DIAGNOSIS — K56609 Unspecified intestinal obstruction, unspecified as to partial versus complete obstruction: Secondary | ICD-10-CM | POA: Diagnosis not present

## 2023-11-18 LAB — URINALYSIS, ROUTINE W REFLEX MICROSCOPIC
Bacteria, UA: NONE SEEN
Bilirubin Urine: NEGATIVE
Glucose, UA: NEGATIVE mg/dL
Ketones, ur: NEGATIVE mg/dL
Leukocytes,Ua: NEGATIVE
Nitrite: NEGATIVE
Protein, ur: 30 mg/dL — AB
Specific Gravity, Urine: 1.021 (ref 1.005–1.030)
pH: 5 (ref 5.0–8.0)

## 2023-11-18 LAB — CBC
HCT: 35.8 % — ABNORMAL LOW (ref 39.0–52.0)
Hemoglobin: 11.6 g/dL — ABNORMAL LOW (ref 13.0–17.0)
MCH: 31.2 pg (ref 26.0–34.0)
MCHC: 32.4 g/dL (ref 30.0–36.0)
MCV: 96.2 fL (ref 80.0–100.0)
Platelets: 171 10*3/uL (ref 150–400)
RBC: 3.72 MIL/uL — ABNORMAL LOW (ref 4.22–5.81)
RDW: 13.8 % (ref 11.5–15.5)
WBC: 4.4 10*3/uL (ref 4.0–10.5)
nRBC: 0 % (ref 0.0–0.2)

## 2023-11-18 LAB — BASIC METABOLIC PANEL
Anion gap: 9 (ref 5–15)
BUN: 26 mg/dL — ABNORMAL HIGH (ref 8–23)
CO2: 25 mmol/L (ref 22–32)
Calcium: 8.2 mg/dL — ABNORMAL LOW (ref 8.9–10.3)
Chloride: 104 mmol/L (ref 98–111)
Creatinine, Ser: 1.17 mg/dL (ref 0.61–1.24)
GFR, Estimated: >60 mL/min (ref >60)
Glucose, Bld: 119 mg/dL — ABNORMAL HIGH (ref 70–99)
Potassium: 4.1 mmol/L (ref 3.5–5.1)
Sodium: 138 mmol/L (ref 135–145)

## 2023-11-18 MED ORDER — TAMSULOSIN HCL 0.4 MG PO CAPS
0.8000 mg | ORAL_CAPSULE | Freq: Every day | ORAL | Status: DC
  Administered 2023-11-18 – 2023-11-21 (×4): 0.8 mg via ORAL
  Filled 2023-11-18 (×4): qty 2

## 2023-11-18 MED ORDER — CHLORHEXIDINE GLUCONATE CLOTH 2 % EX PADS
6.0000 | MEDICATED_PAD | Freq: Every day | CUTANEOUS | Status: DC
  Administered 2023-11-18 – 2023-11-21 (×4): 6 via TOPICAL

## 2023-11-18 MED ORDER — SENNOSIDES-DOCUSATE SODIUM 8.6-50 MG PO TABS
1.0000 | ORAL_TABLET | Freq: Two times a day (BID) | ORAL | Status: DC
  Administered 2023-11-18 – 2023-11-20 (×5): 1 via ORAL
  Filled 2023-11-18 (×5): qty 1

## 2023-11-18 MED ORDER — SENNOSIDES 8.8 MG/5ML PO SYRP
15.0000 mL | ORAL_SOLUTION | Freq: Two times a day (BID) | ORAL | Status: DC
  Administered 2023-11-18: 15 mL via ORAL
  Filled 2023-11-18 (×3): qty 15

## 2023-11-18 MED ORDER — METOPROLOL TARTRATE 5 MG/5ML IV SOLN
5.0000 mg | INTRAVENOUS | Status: DC
  Administered 2023-11-18 – 2023-11-19 (×7): 5 mg via INTRAVENOUS
  Filled 2023-11-18 (×6): qty 5

## 2023-11-18 NOTE — Plan of Care (Signed)

## 2023-11-18 NOTE — Progress Notes (Signed)
 Repeat v/s after iv lopressor given.   11/17/23 2348  Vitals  BP (!) 146/74  MAP (mmHg) 88  BP Location Right Arm  BP Method Automatic  Patient Position (if appropriate) Lying  Pulse Rate 99  MEWS COLOR  MEWS Score Color Green  MEWS Score  MEWS Temp 0  MEWS Systolic 0  MEWS Pulse 0  MEWS RR 0  MEWS LOC 0  MEWS Score 0

## 2023-11-18 NOTE — Plan of Care (Signed)

## 2023-11-18 NOTE — Progress Notes (Signed)
 Yellow mews. Patient just finished ambulating in hallway. V/S obtained due to schedule dose of iv lopressor.   11/17/23 2308  Vitals  BP (!) 189/76  MAP (mmHg) 104  BP Location Right Arm  BP Method Automatic  Patient Position (if appropriate) Lying  Pulse Rate (!) 115  MEWS COLOR  MEWS Score Color Yellow  MEWS Score  MEWS Temp 0  MEWS Systolic 0  MEWS Pulse 2  MEWS RR 0  MEWS LOC 0  MEWS Score 2

## 2023-11-18 NOTE — Progress Notes (Signed)
 One person assist with walker, patient ambulating in hallway.

## 2023-11-18 NOTE — Progress Notes (Signed)
 Patient has not voided after foley catheter removed this morning. Pt had bladder scan performed at 1600 today which only showed 96ml urine retained. Bladder scan currently showing greater than .   Right nare NGT measurement is different from placement. Measurement is currently at 53cm and not 60. Ngt advanced to 60cm. Unable to verify placement with air bolus.   Dr. Thomes Dinning made aware of findings. Writer requested xray for ngt placement and further instructions for urinary retention.

## 2023-11-18 NOTE — Progress Notes (Signed)
 [facility] Progress Note  2 Days Post-Op  Subjective: Patient seen and examined.  He is resting comfortably in bed.  He denies any abdominal pain.  He felt a little nauseous this morning, but denies any vomiting.  He denies any flatus or bowel movement since surgery.  He has had 900 cc of gastric contents from his NG tube in the last 24 hours.  Objective: Vital signs in last 24 hours: Temp:  [97.7 F (36.5 C)-98.3 F (36.8 C)] 98.3 F (36.8 C) (03/09 0351) Pulse Rate:  [98-115] 98 (03/09 1300) Resp:  [18-20] 20 (03/09 1300) BP: (141-189)/(65-76) 154/65 (03/09 1300) SpO2:  [94 %-97 %] 94 % (03/09 1300) Last BM Date : 11/14/23  Intake/Output from previous day: 03/08 0701 - 03/09 0700 In: 956 [I.V.:841; NG/GT:90; IV Piggyback:25] Out: 1450 [Urine:550; Emesis/NG output:900] Intake/Output this shift: No intake/output data recorded.  General appearance: alert, cooperative, and no distress Nose: NG tube in place with gastric contents in canister GI: Abdomen soft, moderate distention, no percussion tenderness, nontender to palpation; no rigidity, guarding, rebound tenderness; midline incision C/D/I with skin staples and honeycomb dressing in place  Lab Results:  Recent Labs    11/17/23 0404 11/18/23 0521  WBC 7.0 4.4  HGB 13.4 11.6*  HCT 41.2 35.8*  PLT 235 171   BMET Recent Labs    11/17/23 0404 11/18/23 0521  NA 139 138  K 4.1 4.1  CL 110 104  CO2 23 25  GLUCOSE 127* 119*  BUN 26* 26*  CREATININE 1.35* 1.17  CALCIUM 8.0* 8.2*   PT/INR No results for input(s): "LABPROT", "INR" in the last 72 hours.  Studies/Results: DG Chest [place] 1 View Result Date: 11/18/2023 CLINICAL DATA:  NG tube EXAM: PORTABLE CHEST 1 VIEW COMPARISON:  Abdominal x-ray 11/16/2023 FINDINGS: Nasogastric tube tip is in the proximal body of the stomach. Position is unchanged from prior. Dilated small bowel loops are unchanged. IMPRESSION: Nasogastric tube tip is in the proximal  body of the stomach. Electronically Signed   By: Darliss Cheney M.D.   On: 11/18/2023 01:04    Anti-infectives: Anti-infectives (From admission, onward)    Start     Dose/Rate Route Frequency Ordered Stop   11/16/23 0600  cefoTEtan (CEFOTAN) 2 g in sodium chloride 0.9 % 100 mL IVPB        2 g 200 mL/hr over 30 Minutes Intravenous On call to O.R. 11/15/23 2114 11/17/23 6213       Assessment/Plan:  Patient is a 78 year old male who is status post exploratory laparotomy with reduction of small bowel volvulus on 3/7.   -I again reiterated the need to maintain NG tube until he has return of bowel function given the likely significant ileus he will have postoperatively -Continue NG to LIS, NPO -IV fluids -Monitor for return of bowel function.   -Continue Dulcolax suppositories and Senokot-S -PRN pain control and antiemetics -Encourage incentive spirometry and ambulation.  Patient's NG tube can be clamped for ambulation -Abdominal binder in place -Appreciate hospitalist recommendations   LOS: 3 days    Illona Bulman A Shabria Egley 11/18/2023  Note: Portions of this report may have been transcribed using voice recognition software. Every effort has been made to ensure accuracy; however, inadvertent computerized transcription errors may still be present.

## 2023-11-18 NOTE — Progress Notes (Signed)
 Patient assisted to recliner chair. Patient states he feels better this morning. ngt output for the shift. Still not passing gas and no bowel movement.

## 2023-11-18 NOTE — Progress Notes (Signed)
 PROGRESS NOTE    Patient: Samuel Ferrell                            PCP: Gaspar Garbe, MD                    DOB: Feb 02, 1946            DOA: 11/14/2023 OZH:[id number]             DOS: 11/18/2023, 11:13 AM   LOS: 3 days   Date of Service: The patient was seen and examined on 11/18/2023  Subjective:   POD #2 Minimal ambulation, NG tube in place, still not reporting any gas or bowel movements Denies of any nausea vomiting Or abdominal pain, as needed use of analgesics  Patient had to be cathed in and out multiple times overnight Crusting of primary cath can be placed    Brief Narrative:   Samuel Ferrell is a 78 y.o. male with medical history significant of hypertension, hyperlipidemia, COPD, A-Fib status post ablation x 2, tobacco abuse who presents to the emergency department due to Abdominal pain which started about 5 hours PTA, this was associated with nausea without vomiting.  Pain was diffuse and severe in nature.     ED Course:  BP was 70/40 on arrival to the ED, but other vital signs were within normal range. Normal CBC except for WBC of 11.0, hematocrit 38.7.  BMP showed Sodium 133, Potassium 4.5, chloride 102, bicarb 19, blood glucose 129, BUN 22, creatinine 1.63 (baseline creatinine at 1.2 - 1.3), ALT 75.  Blood culture pending.  CT abdomen and pelvis with contrast showed small bowel obstruction with an abrupt transition zone along an area of mesenteric twisting within the mid right abdomen. General surgery, Dr. Henreitta Leber was consulted and will follow-up on patient in the morning with plan to possibly do surgery on Friday (3/7).    Assessment & Plan:   Principal Problem:   Small bowel obstruction (HCC) Active Problems:   COPD (chronic obstructive pulmonary disease) (HCC)   AKI (acute kidney injury) (HCC)   Essential hypertension   Mixed hyperlipidemia   Atrial fibrillation, chronic (HCC)    Assessment/Plan   Small bowel obstruction -valvulitis  11/16/2023, postop  day #2 S/p small bowel volvulus reduction, no resection was needed NG tube will remain in place, NG tube about 190 cc out -Still n.p.o. -Still reporting of no gas or bowel movements   - Cringing ambulation Continue abdominal binder  - CT abdominal pelvis was suggestive of small bowel obstruction  -Continue IVF D5 half-normal -Continue as needed IV analgesics, Zofran -Neurosurgery following  - Holding Eliquis    Acute kidney injury Resolved Creatinine 1.63 >> 1.36 >> 1.35, 1.17  (baseline creatinine at 1.2 - 1.3) Continue gentle hydration    Essential hypertension -Hypertensive Continue IV hydralazine 10 mg every 6 hours as needed for SBP > 170 Consider resuming home meds when patient resumes oral intake -Continue metoprolol -increased from 2.5 to 5 mg every 4 hours   Mixed hyperlipidemia Holding p.o. meds    Chronic A-fib Heart rate 98 -115 Continue IV metoprolol increased from 12.5 to 5 mg every 4 hours -N.p.o., holding p.o. meds for now -Holding Eliquis   COPD Continue Brovana and Incruse Ellipta  Urinary retention -Overnight multiple In-N-Out catheter ----next retention overnight 1600, 400 this morning -Creasing Flomax from 0.4 to 0.8 mg -Anticipating placement of Foley catheter  again ----------------------------------------------------------------------------------------------------------------------------------------------- Nutritional status:  The patient's BMI is: Body mass index is 28.88 kg/m. I agree with the assessment and plan as outlined------------------------------------------------------------------------------------------------------------------------------------------------  DVT prophylaxis:  SCDs Start: 11/15/23 0435   Code Status:   Code Status: Full Code  Family Communication: No family member present at bedside- attempt will be made to update daily The above findings and plan of care has been discussed with patient (and family)  in  detail,  they expressed understanding and agreement of above. -Advance care planning has been discussed.   Admission status:   Status is: Inpatient Remains inpatient appropriate because: Needing close observation, n.p.o., NG tube in place, management for small bowel obstruction, surgical evaluation   Disposition: From  - home             Planning for discharge in 1-2 days: to   Procedures:   No admission procedures for hospital encounter.   Antimicrobials:  Anti-infectives (From admission, onward)    Start     Dose/Rate Route Frequency Ordered Stop   11/16/23 0600  cefoTEtan (CEFOTAN) 2 g in sodium chloride 0.9 % 100 mL IVPB        2 g 200 mL/hr over 30 Minutes Intravenous On call to O.R. 11/15/23 2114 11/17/23 0917        Medication:   arformoterol  15 mcg Nebulization BID   And   umeclidinium bromide  1 puff Inhalation Daily   bisacodyl  10 mg Rectal Daily   metoprolol tartrate  5 mg Intravenous Q4H   sennosides  15 mL Oral BID   tamsulosin  0.8 mg Oral QPC breakfast    acetaminophen **OR** acetaminophen, chlorproMAZINE (THORAZINE) 25 mg in sodium chloride 0.9 % 25 mL IVPB, HYDROmorphone (DILAUDID) injection, metoCLOPramide (REGLAN) injection, ondansetron **OR** ondansetron (ZOFRAN) IV, phenol   Objective:   Vitals:   11/17/23 2348 11/18/23 0351 11/18/23 0808 11/18/23 0811  BP: (!) 146/74 (!) 164/70    Pulse: 99 (!) 109    Resp:  20    Temp:  98.3 F (36.8 C)    TempSrc:  Oral    SpO2:  97% 95% 95%  Weight:      Height:        Intake/Output Summary (Last 24 hours) at 11/18/2023 1113 Last data filed at 11/18/2023 1610 Gross per 24 hour  Intake 895.98 ml  Output 1450 ml  Net -554.02 ml   Filed Weights   11/14/23 1938 11/16/23 0735  Weight: 99.3 kg 99.3 kg     Physical examination:       General:  AAO x 3,  cooperative, no distress;   HEENT:  G-tube is in place -will suction normocephalic, PERRL, otherwise with in Normal limits   Neuro:   CNII-XII intact. , normal motor and sensation, reflexes intact   Lungs:   Clear to auscultation BL, Respirations unlabored,  No wheezes / crackles  Cardio:    S1/S2, RRR, No murmure, No Rubs or Gallops   Abdomen:  Abdominal binder in place, soft, distended, hypoactive bowel sounds no guarding or peritoneal signs.  Muscular  skeletal:  Limited exam -global generalized weaknesses - in bed, able to move all 4 extremities,   2+ pulses,  symmetric, No pitting edema  Skin:  Dry, warm to touch, negative for any Rashes,  Wounds: Please see nursing documentation         ------------------------------------------------------------------------------------------------------------------------------------------    LABs:     Latest Ref Rng & Units 11/18/2023    5:21 AM 11/17/2023  4:04 AM 11/16/2023    3:36 AM  CBC  WBC 4.0 - 10.5 K/uL 4.4  7.0  8.2   Hemoglobin 13.0 - 17.0 g/dL 11.9  14.7  82.9   Hematocrit 39.0 - 52.0 % 35.8  41.2  42.8   Platelets 150 - 400 K/uL 171  235  231       Latest Ref Rng & Units 11/18/2023    5:21 AM 11/17/2023    4:04 AM 11/16/2023    3:36 AM  CMP  Glucose 70 - 99 mg/dL 562  130  865   BUN 8 - 23 mg/dL 26  26  28    Creatinine 0.61 - 1.24 mg/dL 7.84  6.96  2.95   Sodium 135 - 145 mmol/L 138  139  137   Potassium 3.5 - 5.1 mmol/L 4.1  4.1  4.2   Chloride 98 - 111 mmol/L 104  110  108   CO2 22 - 32 mmol/L 25  23  22    Calcium 8.9 - 10.3 mg/dL 8.2  8.0  8.5        Micro Results Recent Results (from the past 240 hours)  Blood Culture (routine x 2)     Status: None (Preliminary result)   Collection Time: 11/14/23  8:09 PM   Specimen: BLOOD LEFT FOREARM  Result Value Ref Range Status   Specimen Description BLOOD LEFT FOREARM  Final   Special Requests   Final    BOTTLES DRAWN AEROBIC AND ANAEROBIC Blood Culture adequate volume   Culture   Final    NO GROWTH 4 DAYS Performed at [facility], [address] [place], [place] [address]    Report Status PENDING   Incomplete  Blood Culture (routine x 2)     Status: None (Preliminary result)   Collection Time: 11/14/23  8:30 PM   Specimen: BLOOD LEFT HAND  Result Value Ref Range Status   Specimen Description   Final    BLOOD LEFT HAND BOTTLES DRAWN AEROBIC AND ANAEROBIC   Special Requests Blood Culture adequate volume  Final   Culture   Final    NO GROWTH 4 DAYS Performed at [facility], [address] [place], [place] [address]    Report Status PENDING  Incomplete  Urine Culture     Status: Abnormal (Preliminary result)   Collection Time: 11/15/23 12:00 PM   Specimen: Urine, Random  Result Value Ref Range Status   Specimen Description   Final    URINE, RANDOM Performed at [facility], [address], [place], [place] [address]    Special Requests   Final    NONE Reflexed from [id number] Performed at [facility], [address] [place], [place] [address]    Culture (A)  Final    >=100,000 COLONIES/mL STAPHYLOCOCCUS AUREUS 30,000 COLONIES/mL GROUP B STREP(S.AGALACTIAE)ISOLATED TESTING AGAINST S. AGALACTIAE NOT ROUTINELY PERFORMED DUE TO PREDICTABILITY OF AMP/PEN/VAN SUSCEPTIBILITY. Performed at [facility], [address] [place], [place] [address]    Report Status PENDING  Incomplete  Surgical pcr screen     Status: None   Collection Time: 11/15/23 11:25 PM   Specimen: Nasal Mucosa; Nasal Swab  Result Value Ref Range Status   MRSA, PCR NEGATIVE NEGATIVE Final   Staphylococcus aureus NEGATIVE NEGATIVE Final    Comment: (NOTE) The Xpert SA Assay (FDA approved for NASAL specimens in patients 69 years of age and older), is one component of a comprehensive surveillance program. It is not intended to diagnose infection  nor to guide or monitor treatment. Performed at [facility], [address], [place], [place] [address]     Radiology Reports DG Chest [place] 1 View Result Date: 11/18/2023 CLINICAL DATA:  NG tube EXAM: PORTABLE CHEST 1 VIEW COMPARISON:  Abdominal x-ray 11/16/2023  FINDINGS: Nasogastric tube tip is in the proximal body of the stomach. Position is unchanged from prior. Dilated small bowel loops are unchanged. IMPRESSION: Nasogastric tube tip is in the proximal body of the stomach. Electronically Signed   By: Darliss Cheney M.D.   On: 11/18/2023 01:04     SIGNED: Kendell Bane, MD, FHM. FAAFP. [facility] - [facility] Time spent - 55 min.  In seeing, evaluating and examining the patient. Reviewing medical records, labs, drawn plan of care. [facility],  Pager (please use [email or web address] to page/ text) Please use Epic Secure Chat for non-urgent communication (7AM-7PM)  If 7PM-7AM, please contact night-coverage [email or web address], 11/18/2023, 11:13 AM

## 2023-11-19 ENCOUNTER — Inpatient Hospital Stay (HOSPITAL_COMMUNITY)

## 2023-11-19 ENCOUNTER — Encounter (HOSPITAL_COMMUNITY): Payer: Self-pay | Admitting: General Surgery

## 2023-11-19 DIAGNOSIS — K56609 Unspecified intestinal obstruction, unspecified as to partial versus complete obstruction: Secondary | ICD-10-CM | POA: Diagnosis not present

## 2023-11-19 LAB — CBC
HCT: 38 % — ABNORMAL LOW (ref 39.0–52.0)
Hemoglobin: 12.2 g/dL — ABNORMAL LOW (ref 13.0–17.0)
MCH: 31.4 pg (ref 26.0–34.0)
MCHC: 32.1 g/dL (ref 30.0–36.0)
MCV: 97.9 fL (ref 80.0–100.0)
Platelets: 184 10*3/uL (ref 150–400)
RBC: 3.88 MIL/uL — ABNORMAL LOW (ref 4.22–5.81)
RDW: 13.6 % (ref 11.5–15.5)
WBC: 5.1 10*3/uL (ref 4.0–10.5)
nRBC: 0 % (ref 0.0–0.2)

## 2023-11-19 LAB — BASIC METABOLIC PANEL
Anion gap: 10 (ref 5–15)
BUN: 26 mg/dL — ABNORMAL HIGH (ref 8–23)
CO2: 26 mmol/L (ref 22–32)
Calcium: 8.6 mg/dL — ABNORMAL LOW (ref 8.9–10.3)
Chloride: 104 mmol/L (ref 98–111)
Creatinine, Ser: 1.16 mg/dL (ref 0.61–1.24)
GFR, Estimated: >60 mL/min (ref >60)
Glucose, Bld: 110 mg/dL — ABNORMAL HIGH (ref 70–99)
Potassium: 3.9 mmol/L (ref 3.5–5.1)
Sodium: 140 mmol/L (ref 135–145)

## 2023-11-19 LAB — CULTURE, BLOOD (ROUTINE X 2)
Culture: NO GROWTH
Culture: NO GROWTH
Special Requests: ADEQUATE
Special Requests: ADEQUATE

## 2023-11-19 LAB — MISC LABCORP TEST (SEND OUT)
LabCorp test name: 8680
Labcorp test code: 8680

## 2023-11-19 MED ORDER — METOPROLOL TARTRATE 50 MG PO TABS
75.0000 mg | ORAL_TABLET | Freq: Two times a day (BID) | ORAL | Status: DC
  Administered 2023-11-19 – 2023-11-21 (×5): 75 mg via ORAL
  Filled 2023-11-19 (×6): qty 1

## 2023-11-19 MED ORDER — OXYCODONE HCL 5 MG PO TABS: 5.0000 mg | ORAL_TABLET | ORAL | Status: DC | PRN

## 2023-11-19 MED ORDER — ENOXAPARIN SODIUM 40 MG/0.4ML IJ SOSY
40.0000 mg | PREFILLED_SYRINGE | INTRAMUSCULAR | Status: DC
  Administered 2023-11-19 – 2023-11-20 (×2): 40 mg via SUBCUTANEOUS
  Filled 2023-11-19 (×2): qty 0.4

## 2023-11-19 MED ORDER — MELATONIN 3 MG PO TABS
6.0000 mg | ORAL_TABLET | Freq: Every evening | ORAL | Status: DC | PRN
  Administered 2023-11-19 – 2023-11-20 (×2): 6 mg via ORAL
  Filled 2023-11-19 (×2): qty 2

## 2023-11-19 MED ORDER — HYDROMORPHONE HCL 1 MG/ML IJ SOLN
0.5000 mg | INTRAMUSCULAR | Status: DC | PRN
Start: 2023-11-19 — End: 2023-11-21
  Administered 2023-11-19 – 2023-11-20 (×3): 1 mg via INTRAVENOUS
  Filled 2023-11-19 (×3): qty 1

## 2023-11-19 NOTE — Progress Notes (Signed)
 Mobility Specialist Progress Note:    11/19/23 1030  Mobility  Activity Ambulated with assistance in hallway;Transferred from bed to chair  Level of Assistance Contact guard assist, steadying assist  Assistive Device Front wheel walker  Distance Ambulated (ft) 200 ft  Range of Motion/Exercises Active;All extremities  Activity Response Tolerated well  Mobility Referral Yes  Mobility visit 1 Mobility  Mobility Specialist Start Time (ACUTE ONLY) 1010  Mobility Specialist Stop Time (ACUTE ONLY) 1030  Mobility Specialist Time Calculation (min) (ACUTE ONLY) 20 min   Pt received in bed, agreeable to mobility. Required CGA to stand and ambulate with RW. Tolerated well, asx throughout. Returned to room, left pt in chair. MD at bedside, all needs met.   Lawerance Bach Mobility Specialist Please contact via Special educational needs teacher or  Rehab office at [phone]

## 2023-11-19 NOTE — Progress Notes (Signed)
Foley catheter removed, patient tolerated it well.

## 2023-11-19 NOTE — Plan of Care (Signed)

## 2023-11-19 NOTE — Progress Notes (Signed)
   11/19/23 2051  Urine Characteristics  Urinary Interventions Bladder scan  Bladder Scan Volume (mL) 155 mL    6 hours post catheter removal. Denies the urge to void. Will continue to monitor.

## 2023-11-19 NOTE — Plan of Care (Signed)
  Problem: Education: Goal: Knowledge of General Education information will improve Description: Including pain rating scale, medication(s)/side effects and non-pharmacologic comfort measures Outcome: Progressing   Problem: Clinical Measurements: Goal: Ability to maintain clinical measurements within normal limits will improve Outcome: Progressing Goal: Will remain free from infection Outcome: Progressing Goal: Cardiovascular complication will be avoided Outcome: Progressing   Problem: Activity: Goal: Risk for activity intolerance will decrease Outcome: Progressing   Problem: Coping: Goal: Level of anxiety will decrease Outcome: Progressing   Problem: Pain Managment: Goal: General experience of comfort will improve and/or be controlled Outcome: Progressing   Problem: Skin Integrity: Goal: Risk for impaired skin integrity will decrease Outcome: Progressing   Problem: Elimination: Goal: Will not experience complications related to urinary retention Outcome: Not Progressing

## 2023-11-19 NOTE — Progress Notes (Signed)
 Mobility Specialist Progress Note:    11/19/23 1540  Mobility  Activity Ambulated with assistance in hallway  Level of Assistance Contact guard assist, steadying assist  Assistive Device Front wheel walker  Distance Ambulated (ft) 200 ft  Range of Motion/Exercises Active;All extremities  Activity Response Tolerated well  Mobility Referral Yes  Mobility visit 1 Mobility  Mobility Specialist Start Time (ACUTE ONLY) 1505  Mobility Specialist Stop Time (ACUTE ONLY) 1530  Mobility Specialist Time Calculation (min) (ACUTE ONLY) 25 min   Pt received in chair, agreeable to mobility. Required CGA to stand and ambulate with RW. Tolerated well, asx throughout. Returned to room, left pt in chair. RN at bedside, all needs met.   Lawerance Bach Mobility Specialist Please contact via Special educational needs teacher or  Rehab office at [phone]

## 2023-11-19 NOTE — Plan of Care (Signed)

## 2023-11-19 NOTE — Anesthesia Postprocedure Evaluation (Signed)
 Anesthesia Post Note  Patient: Samuel Ferrell  Procedure(s) Performed: LAPAROTOMY, EXPLORATORY; REDUCTION OF VOLVULUS (Abdomen)  Patient location during evaluation: Phase II Anesthesia Type: General Level of consciousness: awake Pain management: pain level controlled Vital Signs Assessment: post-procedure vital signs reviewed and stable Respiratory status: spontaneous breathing and respiratory function stable Cardiovascular status: blood pressure returned to baseline and stable Postop Assessment: no headache and no apparent nausea or vomiting Anesthetic complications: no Comments: Late entry   No notable events documented.   Last Vitals:  Vitals:   11/19/23 0824 11/19/23 0833  BP:    Pulse:    Resp:    Temp:    SpO2: 97% 99%    Last Pain:  Vitals:   11/19/23 0941  TempSrc:   PainSc: 2                  Windell Norfolk

## 2023-11-19 NOTE — Progress Notes (Signed)
 [facility]  Patient doing better. Having flatus. Wants NG out. 1L in 24 hours. KUB with improved gas dilation and gas mostly in colon, improved ileus.   BP 114/71 (BP Location: Right Arm)   Pulse 93   Temp 97.8 F (36.6 C) (Oral)   Resp 18   Ht 6\' 1"  (1.854 m)   Wt 99.3 kg   SpO2 99%   BMI 28.88 kg/m  Soft, distended, approrpiately tender, staple with honeycomb minor old drainage dried   Patient s/p Ex lap, reduction of volvulus, awaiting bowel function. Foley replaced for retention per the patient.   PRN roxicodone added HD ok NG out Sips and ice for now, [place] ice once per shift On bowel regimen If has BM will give tray Labs reassuring Retention- foley in place, on flomax just increased, defer to Hospitalist on decision for removal SCDs lovenox added, would not start him back on Eliquis until we know he is tolerating a diet  Algis Greenhouse, MD [facility] [address] [place], [place] [address] [phone] (office)

## 2023-11-19 NOTE — TOC Initial Note (Signed)
 Transition of Care [facility]) - Initial/Assessment Note    Patient Details  Name: Samuel Ferrell MRN: [id number] Date of Birth: 1946/02/20  Transition of [facility] CM/SW Contact:    Isabella Bowens, LCSWA Phone Number: 11/19/2023, 1:39 PM  Clinical Narrative:                Patient is at risk for readmission. Patient was admitted for small bowel obstruction. CSW assessed pt. Pt lives at home with significant other who helps him when she is able. Patient VA notification was submitted last week. Pt did share that he cannot drive and that he had a friend who was driving his truck to take him once a week or so , but then pt needed to go out frequently for medically appointments and pt stated that his friend has his own life and cannot keep taking him places. Pt also shared that sally use a walker and cannot drive at this moment.  CSW did call the [place] and spoke with transportation . Transportation shared that since pt does not use a WC or stretcher services, he will have to find his own rides and they can reimburse 41.5 cent . Pt would need to complete this form online or at the [place]. TOC will continue to follow.   Expected Discharge Plan: Home/Self Care Barriers to Discharge: Continued Medical Work up   Patient Goals and CMS Choice Patient states their goals for this hospitalization and ongoing recovery are:: return back home CMS Medicare.gov Compare Post Acute Care list provided to:: Patient Choice offered to / list presented to : Patient      Expected Discharge Plan and Services In-house Referral: Clinical Social Work     Living arrangements for the past 2 months: Single Family Home                                      Prior Living Arrangements/Services Living arrangements for the past 2 months: Single Family Home Lives with:: Spouse Patient language and need for interpreter reviewed:: Yes Do you feel safe going back to the place where you live?: Yes      Need for Family  Participation in Patient Care: Yes (Comment) Care giver support system in place?: Yes (comment) Current home services: DME Criminal Activity/Legal Involvement Pertinent to Current Situation/Hospitalization: No - Comment as needed  Activities of Daily Living   ADL Screening (condition at time of admission) Independently performs ADLs?: Yes (appropriate for developmental age) Is the patient deaf or have difficulty hearing?: No Does the patient have difficulty seeing, even when wearing glasses/contacts?: No Does the patient have difficulty concentrating, remembering, or making decisions?: No  Permission Sought/Granted      Share Information with NAME: Eligh     Permission granted to share info w Relationship: Patient     Emotional Assessment Appearance:: Appears stated age Attitude/Demeanor/Rapport: Engaged Affect (typically observed): Appropriate Orientation: : Oriented to Self, Oriented to Place, Oriented to  Time, Oriented to Situation Alcohol / Substance Use: Tobacco Use Psych Involvement: No (comment)  Admission diagnosis:  Small bowel obstruction (HCC) [K56.609] SBO (small bowel obstruction) (HCC) [K56.609] Abdominal pain, unspecified abdominal location [R10.9] Patient Active Problem List   Diagnosis Date Noted   Small bowel volvulus (HCC) 11/16/2023   Small bowel obstruction (HCC) 11/15/2023   AKI (acute kidney injury) (HCC) 11/15/2023   Essential hypertension 11/15/2023   Mixed hyperlipidemia 11/15/2023  Atrial fibrillation, chronic (HCC) 11/15/2023   COPD (chronic obstructive pulmonary disease) (HCC) 10/11/2023   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   Adenocarcinoma of left lung (HCC) 05/19/2022   Lung nodule    Renal artery stenosis (HCC) 02/14/2022   Paroxysmal atrial fibrillation (HCC) 03/30/2020   HNP (herniated nucleus pulposus) with myelopathy, 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   Pre-operative cardiovascular examination 07/12/2018   CVA (cerebral vascular accident)  (HCC) 06/25/2018   TIA (transient ischemic attack)    PAF (paroxysmal atrial fibrillation) (HCC)    Osteoarthritis of spine with radiculopathy, cervical region    Alcohol abuse, daily use    History of colonic polyps 05/28/2014   Chronic anticoagulation 05/28/2014   Atypical atrial flutter (HCC) 01/29/2014   Alcohol abuse 01/29/2014   Tobacco abuse 01/29/2014   PCP:  Gaspar Garbe, MD Pharmacy:   [facility] - [place], [place] - [address] [place] [address] Phone: [phone] Fax: [phone]  [id number] - [place], [place] - [address] [place] [address] Phone: [phone] Fax: [phone]     Social Drivers of Health (SDOH) Social History: SDOH Screenings   Food Insecurity: No Food Insecurity (11/15/2023)  Housing: Low Risk  (11/15/2023)  Transportation Needs: No Transportation Needs (11/15/2023)  Utilities: Not At Risk (11/15/2023)  Social Connections: Unknown (11/15/2023)  Tobacco Use: High Risk (11/16/2023)   SDOH Interventions:     Readmission Risk Interventions    11/19/2023    1:17 PM 11/15/2023   11:36 AM  Readmission Risk Prevention Plan  Transportation Screening Complete Complete  Home Care Screening  Complete  Medication Review (RN CM)  Complete  HRI or Home Care Consult Complete   Social Work Consult for Recovery Care Planning/Counseling Complete   Palliative Care Screening Not Applicable   Medication Review Oceanographer) Complete

## 2023-11-19 NOTE — Progress Notes (Signed)
 PROGRESS NOTE    Samuel Ferrell  ZOX:[id number] DOB: July 13, 1946 DOA: 11/14/2023 PCP: Gaspar Garbe, MD    Brief Narrative:  78 year old with history of hypertension, hyperlipidemia, COPD, A-fib status post ablation x 2 on Eliquis presented to the emergency department with abdominal pain of acute onset, associated nausea vomiting.  Patient was found to have hypotension with blood pressure 70/40.  AKI.  CT scan abdomen pelvis with small bowel obstruction, mesenteric twisting within the mid right abdomen.  Patient was taken to ex lap.  Postoperatively improving now.  Subjective: Patient seen in the morning rounds.  Mild discomfort.  He was very happy to have NG tube removed.  He wants his Foley catheter to be removed.  He thinks he goes to bathroom he will be able to pee. Assessment & Plan:   Small bowel obstruction secondary to small bowel volvulus: Postop day 3.  Gradual return of bowel functions now. Followed by surgery.  NG tube removed today.  Sips of water.  Continue ambulation.  Abdominal binder.  Adequate pain medications.  Continue maintenance IV fluids today.  Anticipating gradual advancement of diet.  Acute kidney injury, dehydration, urinary retention with history of BPH: Due to above.  Treated with IV fluids.  Renal functions improving and normalized.  Patient had needed multiple straight cath and currently has a Foley catheter.  Patient is now ambulatory and on increasing dose of Flomax.  Discontinue Foley catheter today.  Recheck electrolytes tomorrow morning.  Essential hypertension: Blood pressure stable.  Currently on a scheduled IV metoprolol. Start home dose of metoprolol 75 mg twice daily.  Paroxysmal A-fib: Currently in sinus rhythm.  Started metoprolol.  Eliquis on hold until return of bowel functions.  DC Foley.  Continue to mobilize.  Anticipate home when able to tolerate diet.   DVT prophylaxis: enoxaparin (LOVENOX) injection 40 mg Start: 11/19/23 2200 SCDs  Start: 11/15/23 0435   Code Status: Full code Family Communication: None at the bedside Disposition Plan: Status is: Inpatient Remains inpatient appropriate because: Postoperative recovery.     Consultants:  General surgery  Procedures:  Ex lap, reduction of small bowel volvulus.  Antimicrobials:  None     Objective: Vitals:   11/18/23 1926 11/19/23 0313 11/19/23 0824 11/19/23 0833  BP: 136/70 114/71    Pulse: (!) 101 93    Resp: 17 18    Temp: 98 F (36.7 C) 97.8 F (36.6 C)    TempSrc: Oral Oral    SpO2: 95% 94% 97% 99%  Weight:      Height:        Intake/Output Summary (Last 24 hours) at 11/19/2023 1346 Last data filed at 11/19/2023 0912 Gross per 24 hour  Intake 114.05 ml  Output 2300 ml  Net -2185.95 ml   Filed Weights   11/14/23 1938 11/16/23 0735  Weight: 99.3 kg 99.3 kg    Examination:  General exam: Appears calm and comfortable.  Sitting in chair. Respiratory system: Clear to auscultation. Respiratory effort normal. Cardiovascular system: S1 & S2 heard, RRR. No pedal edema. Gastrointestinal system: Soft.  Mild tenderness and distended.  Bowel sound present.  Foley catheter with clear urine. Central nervous system: Alert and oriented. No focal neurological deficits. Extremities: Symmetric 5 x 5 power. Skin: No rashes, lesions or ulcers Psychiatry: Judgement and insight appear normal. Mood & affect appropriate.     Data Reviewed: I have personally reviewed following labs and imaging studies  CBC: Recent Labs  Lab 11/14/23 2030 11/15/23 0529 11/16/23  1610 11/17/23 0404 11/18/23 0521 11/19/23 0450  WBC 11.0* 8.6 8.2 7.0 4.4 5.1  NEUTROABS 8.9*  --   --   --   --   --   HGB 13.1 14.2 14.2 13.4 11.6* 12.2*  HCT 38.7* 41.7 42.8 41.2 35.8* 38.0*  MCV 95.8 93.9 96.6 97.4 96.2 97.9  PLT 216 216 231 235 171 184   Basic Metabolic Panel: Recent Labs  Lab 11/15/23 0529 11/16/23 0336 11/17/23 0404 11/18/23 0521 11/19/23 0450  NA 134* 137  139 138 140  K 4.6 4.2 4.1 4.1 3.9  CL 101 108 110 104 104  CO2 21* 22 23 25 26   GLUCOSE 115* 113* 127* 119* 110*  BUN 23 28* 26* 26* 26*  CREATININE 1.64* 1.36* 1.35* 1.17 1.16  CALCIUM 9.3 8.5* 8.0* 8.2* 8.6*  MG 2.1  --  2.5*  --   --   PHOS 5.4*  --  2.9  --   --    GFR: Estimated Creatinine Clearance: 67.2 mL/min (by C-G formula based on SCr of 1.16 mg/dL). Liver Function Tests: Recent Labs  Lab 11/14/23 2030 11/15/23 0529  AST 39 43*  ALT 75* 72*  ALKPHOS 87 96  BILITOT 0.6 0.9  PROT 7.3 7.8  ALBUMIN 3.9 4.1   No results for input(s): "LIPASE", "AMYLASE" in the last 168 hours. No results for input(s): "AMMONIA" in the last 168 hours. Coagulation Profile: Recent Labs  Lab 11/14/23 2030  INR 1.2   Cardiac Enzymes: No results for input(s): "CKTOTAL", "CKMB", "CKMBINDEX", "TROPONINI" in the last 168 hours. BNP (last 3 results) No results for input(s): "PROBNP" in the last 8760 hours. HbA1C: No results for input(s): "HGBA1C" in the last 72 hours. CBG: No results for input(s): "GLUCAP" in the last 168 hours. Lipid Profile: No results for input(s): "CHOL", "HDL", "LDLCALC", "TRIG", "CHOLHDL", "LDLDIRECT" in the last 72 hours. Thyroid Function Tests: No results for input(s): "TSH", "T4TOTAL", "FREET4", "T3FREE", "THYROIDAB" in the last 72 hours. Anemia Panel: No results for input(s): "VITAMINB12", "FOLATE", "FERRITIN", "TIBC", "IRON", "RETICCTPCT" in the last 72 hours. Sepsis Labs: Recent Labs  Lab 11/14/23 2009  LATICACIDVEN 1.5    Recent Results (from the past 240 hours)  Blood Culture (routine x 2)     Status: None   Collection Time: 11/14/23  8:09 PM   Specimen: BLOOD LEFT FOREARM  Result Value Ref Range Status   Specimen Description BLOOD LEFT FOREARM  Final   Special Requests   Final    BOTTLES DRAWN AEROBIC AND ANAEROBIC Blood Culture adequate volume   Culture   Final    NO GROWTH 5 DAYS Performed at [facility], [address] [place],  [place] [address]    Report Status 11/19/2023 FINAL  Final  Blood Culture (routine x 2)     Status: None   Collection Time: 11/14/23  8:30 PM   Specimen: BLOOD LEFT HAND  Result Value Ref Range Status   Specimen Description   Final    BLOOD LEFT HAND BOTTLES DRAWN AEROBIC AND ANAEROBIC   Special Requests Blood Culture adequate volume  Final   Culture   Final    NO GROWTH 5 DAYS Performed at [facility], [address], [place], [place] [address]    Report Status 11/19/2023 FINAL  Final  Urine Culture     Status: Abnormal (Preliminary result)   Collection Time: 11/15/23 12:00 PM   Specimen: Urine, Random  Result Value Ref Range Status   Specimen Description  Final    URINE, RANDOM Performed at [facility], [address] [place], [place] [address]    Special Requests   Final    NONE Reflexed from [id number] Performed at [facility], [address] [place], [place] [address]    Culture (A)  Final    >=100,000 COLONIES/mL STAPHYLOCOCCUS AUREUS 30,000 COLONIES/mL GROUP B STREP(S.AGALACTIAE)ISOLATED TESTING AGAINST S. AGALACTIAE NOT ROUTINELY PERFORMED DUE TO PREDICTABILITY OF AMP/PEN/VAN SUSCEPTIBILITY. Sent to [facility] for further susceptibility testing. Performed at [facility], [address], [place], [place] [address]    Report Status PENDING  Incomplete  Surgical pcr screen     Status: None   Collection Time: 11/15/23 11:25 PM   Specimen: Nasal Mucosa; Nasal Swab  Result Value Ref Range Status   MRSA, PCR NEGATIVE NEGATIVE Final   Staphylococcus aureus NEGATIVE NEGATIVE Final    Comment: (NOTE) The Xpert SA Assay (FDA approved for NASAL specimens in patients 23 years of age and older), is one component of a comprehensive surveillance program. It is not intended to diagnose infection nor to guide or monitor treatment. Performed at [facility], [address], [place], [place] [address]          Radiology Studies: DG Abd 1 View Result Date: 11/19/2023 CLINICAL  DATA:  Postoperative ileus EXAM: ABDOMEN - 1 VIEW COMPARISON:  11/15/2023 FINDINGS: Scattered large and small bowel gas is noted. A few mildly dilated loops of small bowel are noted although overall improved when compared with the prior exam. No true obstructive changes are seen. IMPRESSION: Minimal residual small bowel dilatation consistent with near complete resolution of postop ileus. Electronically Signed   By: Alcide Clever M.D.   On: 11/19/2023 10:36   DG Chest Port 1 View Result Date: 11/18/2023 CLINICAL DATA:  NG tube EXAM: PORTABLE CHEST 1 VIEW COMPARISON:  Abdominal x-ray 11/16/2023 FINDINGS: Nasogastric tube tip is in the proximal body of the stomach. Position is unchanged from prior. Dilated small bowel loops are unchanged. IMPRESSION: Nasogastric tube tip is in the proximal body of the stomach. Electronically Signed   By: Darliss Cheney M.D.   On: 11/18/2023 01:04        Scheduled Meds:  arformoterol  15 mcg Nebulization BID   And   umeclidinium bromide  1 puff Inhalation Daily   bisacodyl  10 mg Rectal Daily   Chlorhexidine Gluconate Cloth  6 each Topical Daily   enoxaparin (LOVENOX) injection  40 mg Subcutaneous Q24H   metoprolol tartrate  75 mg Oral BID   senna-docusate  1 tablet Oral BID   tamsulosin  0.8 mg Oral QPC breakfast   Continuous Infusions:  chlorproMAZINE (THORAZINE) 25 mg in sodium chloride 0.9 % 25 mL IVPB 25 mg (11/19/23 0527)     LOS: 4 days    Time spent: 45 minutes    Dorcas Carrow, MD [facility]

## 2023-11-20 DIAGNOSIS — K56609 Unspecified intestinal obstruction, unspecified as to partial versus complete obstruction: Secondary | ICD-10-CM | POA: Diagnosis not present

## 2023-11-20 LAB — BASIC METABOLIC PANEL
Anion gap: 11 (ref 5–15)
BUN: 31 mg/dL — ABNORMAL HIGH (ref 8–23)
CO2: 24 mmol/L (ref 22–32)
Calcium: 8.7 mg/dL — ABNORMAL LOW (ref 8.9–10.3)
Chloride: 105 mmol/L (ref 98–111)
Creatinine, Ser: 1.17 mg/dL (ref 0.61–1.24)
GFR, Estimated: >60 mL/min (ref >60)
Glucose, Bld: 88 mg/dL (ref 70–99)
Potassium: 3.8 mmol/L (ref 3.5–5.1)
Sodium: 140 mmol/L (ref 135–145)

## 2023-11-20 LAB — CBC
HCT: 37.7 % — ABNORMAL LOW (ref 39.0–52.0)
Hemoglobin: 12.2 g/dL — ABNORMAL LOW (ref 13.0–17.0)
MCH: 31.8 pg (ref 26.0–34.0)
MCHC: 32.4 g/dL (ref 30.0–36.0)
MCV: 98.2 fL (ref 80.0–100.0)
Platelets: 202 10*3/uL (ref 150–400)
RBC: 3.84 MIL/uL — ABNORMAL LOW (ref 4.22–5.81)
RDW: 13.7 % (ref 11.5–15.5)
WBC: 5 10*3/uL (ref 4.0–10.5)
nRBC: 0 % (ref 0.0–0.2)

## 2023-11-20 MED ORDER — DOCUSATE SODIUM 100 MG PO CAPS
100.0000 mg | ORAL_CAPSULE | Freq: Two times a day (BID) | ORAL | Status: DC
  Administered 2023-11-20 – 2023-11-21 (×3): 100 mg via ORAL
  Filled 2023-11-20 (×3): qty 1

## 2023-11-20 NOTE — Plan of Care (Signed)

## 2023-11-20 NOTE — Progress Notes (Signed)
 PROGRESS NOTE    Samuel Ferrell  ZOX:[id number] DOB: May 03, 1946 DOA: 11/14/2023 PCP: Gaspar Garbe, MD    Brief Narrative:  78 year old with history of hypertension, hyperlipidemia, COPD, A-fib status post ablation x 2 on Eliquis presented to the emergency department with abdominal pain of acute onset, associated nausea vomiting.  Patient was found to have hypotension with blood pressure 70/40.  AKI.  CT scan abdomen pelvis with small bowel obstruction, mesenteric twisting within the mid right abdomen.  Patient was taken to ex lap.  Postoperatively improving now.  Subjective:  Patient was seen and examined.  Mild abdominal soreness present.  He was very happy to have clear liquid diet.  He had frequent difficulty urinating after removal of Foley catheter yesterday, however since today morning he is able to urinate without difficulties.  Mobilizing around.  Denies any nausea vomiting.  Patient is passing flatus.  Assessment & Plan:   Small bowel obstruction secondary to small bowel volvulus: Postop day 4.  Gradual return of bowel functions now.  Challenging with clears today. Continue ambulation.  Abdominal binder.  Adequate pain medications.  Anticipating gradual advancement of diet.  Acute kidney injury, dehydration, urinary retention with history of BPH: Due to above.  Treated with IV fluids.  Renal functions improving and normalized.  Successful voiding trial.  Increasing dose of Flomax.    Essential hypertension: Blood pressure stable.  home dose of metoprolol 75 mg twice daily.  Paroxysmal A-fib: Currently in sinus rhythm.  Started metoprolol.  Eliquis on hold until return of bowel functions.   DVT prophylaxis: enoxaparin (LOVENOX) injection 40 mg Start: 11/19/23 2200 SCDs Start: 11/15/23 0435   Code Status: Full code Family Communication: None at the bedside Disposition Plan: Status is: Inpatient Remains inpatient appropriate because: Postoperative recovery.      Consultants:  General surgery  Procedures:  Ex lap, reduction of small bowel volvulus.  Antimicrobials:  None     Objective: Vitals:   11/20/23 0330 11/20/23 0902 11/20/23 0904 11/20/23 0941  BP: 113/63   137/63  Pulse: 79   87  Resp: 19     Temp: 97.8 F (36.6 C)     TempSrc: Oral     SpO2: 96% 97% 98% 100%  Weight:      Height:        Intake/Output Summary (Last 24 hours) at 11/20/2023 1122 Last data filed at 11/20/2023 0421 Gross per 24 hour  Intake 0 ml  Output 400 ml  Net -400 ml   Filed Weights   11/14/23 1938 11/16/23 0735  Weight: 99.3 kg 99.3 kg    Examination:  General exam: Appears calm and comfortable.  Pleasant and interactive. Respiratory system: Clear to auscultation. Respiratory effort normal. Cardiovascular system: S1 & S2 heard, RRR. No pedal edema. Gastrointestinal system: Soft.  Mild tenderness and distended.  Bowel sound present.   Central nervous system: Alert and oriented. No focal neurological deficits. Extremities: Symmetric 5 x 5 power. Skin: No rashes, lesions or ulcers Psychiatry: Judgement and insight appear normal. Mood & affect appropriate.     Data Reviewed: I have personally reviewed following labs and imaging studies  CBC: Recent Labs  Lab 11/14/23 2030 11/15/23 0529 11/16/23 0336 11/17/23 0404 11/18/23 0521 11/19/23 0450 11/20/23 0445  WBC 11.0*   < > 8.2 7.0 4.4 5.1 5.0  NEUTROABS 8.9*  --   --   --   --   --   --   HGB 13.1   < > 14.2  13.4 11.6* 12.2* 12.2*  HCT 38.7*   < > 42.8 41.2 35.8* 38.0* 37.7*  MCV 95.8   < > 96.6 97.4 96.2 97.9 98.2  PLT 216   < > 231 235 171 184 202   < > = values in this interval not displayed.   Basic Metabolic Panel: Recent Labs  Lab 11/15/23 0529 11/16/23 0336 11/17/23 0404 11/18/23 0521 11/19/23 0450 11/20/23 0445  NA 134* 137 139 138 140 140  K 4.6 4.2 4.1 4.1 3.9 3.8  CL 101 108 110 104 104 105  CO2 21* 22 23 25 26 24   GLUCOSE 115* 113* 127* 119* 110* 88  BUN  23 28* 26* 26* 26* 31*  CREATININE 1.64* 1.36* 1.35* 1.17 1.16 1.17  CALCIUM 9.3 8.5* 8.0* 8.2* 8.6* 8.7*  MG 2.1  --  2.5*  --   --   --   PHOS 5.4*  --  2.9  --   --   --    GFR: Estimated Creatinine Clearance: 66.6 mL/min (by C-G formula based on SCr of 1.17 mg/dL). Liver Function Tests: Recent Labs  Lab 11/14/23 2030 11/15/23 0529  AST 39 43*  ALT 75* 72*  ALKPHOS 87 96  BILITOT 0.6 0.9  PROT 7.3 7.8  ALBUMIN 3.9 4.1   No results for input(s): "LIPASE", "AMYLASE" in the last 168 hours. No results for input(s): "AMMONIA" in the last 168 hours. Coagulation Profile: Recent Labs  Lab 11/14/23 2030  INR 1.2   Cardiac Enzymes: No results for input(s): "CKTOTAL", "CKMB", "CKMBINDEX", "TROPONINI" in the last 168 hours. BNP (last 3 results) No results for input(s): "PROBNP" in the last 8760 hours. HbA1C: No results for input(s): "HGBA1C" in the last 72 hours. CBG: No results for input(s): "GLUCAP" in the last 168 hours. Lipid Profile: No results for input(s): "CHOL", "HDL", "LDLCALC", "TRIG", "CHOLHDL", "LDLDIRECT" in the last 72 hours. Thyroid Function Tests: No results for input(s): "TSH", "T4TOTAL", "FREET4", "T3FREE", "THYROIDAB" in the last 72 hours. Anemia Panel: No results for input(s): "VITAMINB12", "FOLATE", "FERRITIN", "TIBC", "IRON", "RETICCTPCT" in the last 72 hours. Sepsis Labs: Recent Labs  Lab 11/14/23 2009  LATICACIDVEN 1.5    Recent Results (from the past 240 hours)  Blood Culture (routine x 2)     Status: None   Collection Time: 11/14/23  8:09 PM   Specimen: BLOOD LEFT FOREARM  Result Value Ref Range Status   Specimen Description BLOOD LEFT FOREARM  Final   Special Requests   Final    BOTTLES DRAWN AEROBIC AND ANAEROBIC Blood Culture adequate volume   Culture   Final    NO GROWTH 5 DAYS Performed at [facility], [address] [place], [place] [address]    Report Status 11/19/2023 FINAL  Final  Blood Culture (routine x 2)     Status: None    Collection Time: 11/14/23  8:30 PM   Specimen: BLOOD LEFT HAND  Result Value Ref Range Status   Specimen Description   Final    BLOOD LEFT HAND BOTTLES DRAWN AEROBIC AND ANAEROBIC   Special Requests Blood Culture adequate volume  Final   Culture   Final    NO GROWTH 5 DAYS Performed at [facility], [address] [place], [place] [address]    Report Status 11/19/2023 FINAL  Final  Urine Culture     Status: Abnormal (Preliminary result)   Collection Time: 11/15/23 12:00 PM   Specimen: Urine, Random  Result Value Ref Range Status   Specimen Description  Final    URINE, RANDOM Performed at [facility], [address] [place], [place] [address]    Special Requests   Final    NONE Reflexed from [id number] Performed at [facility], [address], [place], [place] [address]    Culture (A)  Final    >=100,000 COLONIES/mL STAPHYLOCOCCUS AUREUS 30,000 COLONIES/mL GROUP B STREP(S.AGALACTIAE)ISOLATED TESTING AGAINST S. AGALACTIAE NOT ROUTINELY PERFORMED DUE TO PREDICTABILITY OF AMP/PEN/VAN SUSCEPTIBILITY. Sent to [facility] for further susceptibility testing. Performed at [facility], [address], [place], [place] [address]    Report Status PENDING  Incomplete  Surgical pcr screen     Status: None   Collection Time: 11/15/23 11:25 PM   Specimen: Nasal Mucosa; Nasal Swab  Result Value Ref Range Status   MRSA, PCR NEGATIVE NEGATIVE Final   Staphylococcus aureus NEGATIVE NEGATIVE Final    Comment: (NOTE) The Xpert SA Assay (FDA approved for NASAL specimens in patients 28 years of age and older), is one component of a comprehensive surveillance program. It is not intended to diagnose infection nor to guide or monitor treatment. Performed at [facility], [address], [place], [place] [address]          Radiology Studies: DG Abd 1 View Result Date: 11/19/2023 CLINICAL DATA:  Postoperative ileus EXAM: ABDOMEN - 1 VIEW COMPARISON:  11/15/2023 FINDINGS: Scattered large and  small bowel gas is noted. A few mildly dilated loops of small bowel are noted although overall improved when compared with the prior exam. No true obstructive changes are seen. IMPRESSION: Minimal residual small bowel dilatation consistent with near complete resolution of postop ileus. Electronically Signed   By: Alcide Clever M.D.   On: 11/19/2023 10:36        Scheduled Meds:  arformoterol  15 mcg Nebulization BID   And   umeclidinium bromide  1 puff Inhalation Daily   bisacodyl  10 mg Rectal Daily   Chlorhexidine Gluconate Cloth  6 each Topical Daily   docusate sodium  100 mg Oral BID   enoxaparin (LOVENOX) injection  40 mg Subcutaneous Q24H   metoprolol tartrate  75 mg Oral BID   senna-docusate  1 tablet Oral BID   tamsulosin  0.8 mg Oral QPC breakfast   Continuous Infusions:  chlorproMAZINE (THORAZINE) 25 mg in sodium chloride 0.9 % 25 mL IVPB 25 mg (11/19/23 0527)     LOS: 5 days    Time spent: 45 minutes    Dorcas Carrow, MD [facility]

## 2023-11-20 NOTE — Progress Notes (Signed)
 [facility]  Having flatus. No Bms reported. No nausea. Has tolerated his sips. Wants something to eat.  BP 137/63   Pulse 87   Temp 97.8 F (36.6 C) (Oral)   Resp 19   Ht 6\' 1"  (1.854 m)   Wt 99.3 kg   SpO2 100%   BMI 28.88 kg/m  Soft, distended, appropriately tender, honeycomb with some dried staining Binder replaced Left arm sutures from [place] removed (had skin procedure he reports- sutures due out today) and steri strips placed, healed   Patient s/p Ex lap, reduction of volvulus, awaiting bowel function.    PRN roxicodone added HD ok NG out Clear diet Colace added  Labs reassuring SCDs lovenox added, would not start him back on Eliquis until we know he is tolerating a diet  Future Appointments  Date Time Provider Department Center  11/28/2023  2:15 PM Lucretia Roers, MD [facility] None   Algis Greenhouse, MD [facility] [address] [place], [place] [address] [phone] (office)

## 2023-11-20 NOTE — Progress Notes (Signed)
 Mobility Specialist Progress Note:    11/20/23 1045  Mobility  Activity Ambulated with assistance in hallway;Transferred from bed to chair  Level of Assistance Standby assist, set-up cues, supervision of patient - no hands on  Assistive Device Front wheel walker  Distance Ambulated (ft) 400 ft  Range of Motion/Exercises Active;All extremities  Activity Response Tolerated well  Mobility Referral Yes  Mobility visit 1 Mobility  Mobility Specialist Start Time (ACUTE ONLY) 1025  Mobility Specialist Stop Time (ACUTE ONLY) 1045  Mobility Specialist Time Calculation (min) (ACUTE ONLY) 20 min   Pt received in bed, agreeable to mobility. Requried SBA to stand and ambulate with RW. Tolerated well, asx throughout. Returned to room, left pt in chair. Call bell in reach, all needs met.   Lawerance Bach Mobility Specialist Please contact via Special educational needs teacher or  Rehab office at [phone]

## 2023-11-20 NOTE — Progress Notes (Signed)
 Bladder scan performed and yielded 415. Dr. Thomes Dinning notified. While awaiting further orders, patient was able to void 400. Will continue to monitor.

## 2023-11-21 MED ORDER — POLYETHYLENE GLYCOL 3350 17 G PO PACK: 17.0000 g | PACK | Freq: Every day | ORAL | Status: DC

## 2023-11-21 MED ORDER — OXYCODONE HCL 5 MG PO TABS: 5.0000 mg | ORAL_TABLET | ORAL | 0 refills | Status: DC | PRN

## 2023-11-21 MED ORDER — POLYETHYLENE GLYCOL 3350 17 G PO PACK: 17.0000 g | PACK | Freq: Every day | ORAL | 0 refills | Status: DC | PRN

## 2023-11-21 MED ORDER — TAMSULOSIN HCL 0.4 MG PO CAPS: 0.8000 mg | ORAL_CAPSULE | Freq: Every day | ORAL | 1 refills | Status: DC

## 2023-11-21 MED ORDER — DOCUSATE SODIUM 100 MG PO CAPS: 100.0000 mg | ORAL_CAPSULE | Freq: Two times a day (BID) | ORAL | 0 refills | Status: DC

## 2023-11-21 MED ORDER — POLYETHYLENE GLYCOL 3350 17 G PO PACK
17.0000 g | PACK | Freq: Every day | ORAL | Status: DC
  Administered 2023-11-21: 17 g via ORAL
  Filled 2023-11-21: qty 1

## 2023-11-21 MED ORDER — ONDANSETRON HCL 4 MG PO TABS: 4.0000 mg | ORAL_TABLET | Freq: Four times a day (QID) | ORAL | 0 refills | Status: DC | PRN

## 2023-11-21 NOTE — Discharge Summary (Signed)
 Physician Discharge Summary  Patient ID: Samuel Ferrell MRN: [id number] DOB/AGE: Aug 26, 1946 78 y.o.  Admit date: 11/14/2023 Discharge date: 11/21/2023  Admission Diagnoses: SBO  Discharge Diagnoses:  Principal Problem:   Small bowel obstruction (HCC) Active Problems:   COPD (chronic obstructive pulmonary disease) (HCC)   AKI (acute kidney injury) (HCC)   Essential hypertension   Mixed hyperlipidemia   Atrial fibrillation, chronic (HCC)   Small bowel volvulus (HCC)   Discharged Condition: good  Hospital Course: Samuel Ferrell is a 77 yo who came in with nausea/vomiting and abdominal pain. CT showed SBO and concern for mesenteric twisting. He was brought into the hospital and his eliquis was held. He was taken back to surgery on 11/16/23. He had a volvulus and this was untwisted. He was kept post operatively with an NG and his diet was slowly advanced. He had lots of flatus and a large BM before discharge. He was tolerating a soft diet. He had no complaints of nausea.   Consults:  hospitalist admission- taken over by surgery   Significant Diagnostic Studies:   Lab Results  Component Value Date   WBC 5.0 11/20/2023   HGB 12.2 (L) 11/20/2023   HCT 37.7 (L) 11/20/2023   MCV 98.2 11/20/2023   PLT 202 11/20/2023   Lab Results  Component Value Date   NA 140 11/20/2023   K 3.8 11/20/2023   CO2 24 11/20/2023   GLUCOSE 88 11/20/2023   BUN 31 (H) 11/20/2023   CREATININE 1.17 11/20/2023   CALCIUM 8.7 (L) 11/20/2023   EGFR 49 (L) 11/29/2020   GFRNONAA >60 11/20/2023     Treatments: IV hydration and 11/16/23 Ex lap, reduction of small bowel volvulus   Discharge Exam: Blood pressure (!) 127/58, pulse 69, temperature 97.7 F (36.5 C), temperature source Oral, resp. rate 18, height 6\' 1"  (1.854 m), weight 99.3 kg, SpO2 98%. General appearance: alert and no distress Resp: normal work of breathing GI: binder in place, soft but mildly distended, appropriately tender, staples c/d/I with no  erythema or drainage, bruising evolving right side greater than left side   Disposition: Discharge disposition: 01-Home or Self Care       Discharge Instructions     Call MD for:  difficulty breathing, headache or visual disturbances   Complete by: As directed    Call MD for:  extreme fatigue   Complete by: As directed    Call MD for:  persistant dizziness or light-headedness   Complete by: As directed    Call MD for:  persistant nausea and vomiting   Complete by: As directed    Call MD for:  redness, tenderness, or signs of infection (pain, swelling, redness, odor or green/yellow discharge around incision site)   Complete by: As directed    Call MD for:  severe uncontrolled pain   Complete by: As directed    Call MD for:  temperature >100.4   Complete by: As directed    Increase activity slowly   Complete by: As directed       Allergies as of 11/21/2023   No Known Allergies      Medication List     TAKE these medications    apixaban 5 MG Tabs tablet Commonly known as: ELIQUIS Take 5 mg by mouth 2 (two) times daily.   aspirin EC 81 MG tablet Take 81 mg by mouth daily. Swallow whole.   atorvastatin 40 MG tablet Commonly known as: LIPITOR Take 40 mg by mouth every evening.  docusate sodium 100 MG capsule Commonly known as: COLACE Take 1 capsule (100 mg total) by mouth 2 (two) times daily.   dofetilide 500 MCG capsule Commonly known as: TIKOSYN Take 1 capsule (500 mcg total) by mouth 2 (two) times daily.   enalapril 10 MG tablet Commonly known as: VASOTEC Take 1 tablet (10 mg total) by mouth at bedtime.   ferrous sulfate 325 (65 FE) MG tablet Take 325 mg by mouth daily with breakfast. 250mg  per patient   Magnesium 250 MG Tabs Take 250 mg by mouth every evening.   metoprolol tartrate 50 MG tablet Commonly known as: LOPRESSOR Take 1.5 tablets (75 mg total) by mouth 2 (two) times daily.   multivitamin tablet Take 1 tablet by mouth daily. Centrum  50+   omeprazole 20 MG capsule Commonly known as: PRILOSEC Take 20 mg by mouth daily.   ondansetron 4 MG tablet Commonly known as: ZOFRAN Place 1 tablet (4 mg total) into feeding tube every 6 (six) hours as needed for nausea.   oxyCODONE 5 MG immediate release tablet Commonly known as: Oxy IR/ROXICODONE Take 1 tablet (5 mg total) by mouth every 4 (four) hours as needed for severe pain (pain score 7-10) or breakthrough pain.   polyethylene glycol 17 g packet Commonly known as: MIRALAX / GLYCOLAX Take 17 g by mouth daily as needed for moderate constipation.   potassium chloride SA 20 MEQ tablet Commonly known as: Klor-Con M20 Take 1 tablet (20 mEq total) by mouth daily.   Stiolto Respimat 2.5-2.5 MCG/ACT Aers Generic drug: Tiotropium Bromide-Olodaterol Inhale 2 puffs into the lungs daily. What changed:  how much to take when to take this   tamsulosin 0.4 MG Caps capsule Commonly known as: FLOMAX Take 2 capsules (0.8 mg total) by mouth daily after breakfast.   vitamin C 250 MG tablet Commonly known as: ASCORBIC ACID Take 250 mg by mouth daily.        Follow-up Information     Lucretia Roers, MD Follow up on 11/28/2023.   Specialty: General Surgery Why: staple removal Contact information: [address] [place] [facility] [address] [phone]                 Signed: Lucretia Roers 11/21/2023, 10:12 AM

## 2023-11-22 ENCOUNTER — Other Ambulatory Visit: Payer: Self-pay | Admitting: *Deleted

## 2023-11-22 DIAGNOSIS — N390 Urinary tract infection, site not specified: Secondary | ICD-10-CM

## 2023-11-22 MED ORDER — SULFAMETHOXAZOLE-TRIMETHOPRIM 800-160 MG PO TABS
1.0000 | ORAL_TABLET | Freq: Two times a day (BID) | ORAL | 0 refills | Status: AC
Start: 2023-11-22 — End: 2023-12-02

## 2023-11-22 NOTE — Progress Notes (Signed)
 Patient had UA and culture done in the hospital. He has staph in the urine. Lets send him in Bactrim for 10  days and if the susceptibilities come back abnormal we will send something different in. Can you send in and let him know.

## 2023-11-27 LAB — SUSCEPTIBILITY, AER + ANAEROB

## 2023-11-27 LAB — SUSCEPTIBILITY RESULT

## 2023-11-27 LAB — URINE CULTURE: Culture: >=100000 — AB

## 2023-11-28 ENCOUNTER — Ambulatory Visit (INDEPENDENT_AMBULATORY_CARE_PROVIDER_SITE_OTHER): Admitting: General Surgery

## 2023-11-28 ENCOUNTER — Encounter: Payer: Self-pay | Admitting: General Surgery

## 2023-11-28 VITALS — BP 124/65 | HR 66 | Temp 97.5°F | Resp 18 | Ht 73.0 in | Wt 209.0 lb

## 2023-11-28 DIAGNOSIS — H9313 Tinnitus, bilateral: Secondary | ICD-10-CM

## 2023-11-28 DIAGNOSIS — K562 Volvulus: Secondary | ICD-10-CM

## 2023-11-28 MED ORDER — AMOXICILLIN-POT CLAVULANATE 875-125 MG PO TABS: 1.0000 | ORAL_TABLET | Freq: Two times a day (BID) | ORAL | 0 refills | Status: AC

## 2023-11-28 MED ORDER — AMOXICILLIN-POT CLAVULANATE 875-125 MG PO TABS: 1.0000 | ORAL_TABLET | Freq: Two times a day (BID) | ORAL | 0 refills | Status: DC

## 2023-11-28 NOTE — Progress Notes (Signed)
 [facility]  Doing fair but his friend reports he has had some darker stools over the weekend. Patient says this is improving. He had a UTI that likely caused some of his retention from his foley. Bactrim ordered. He has been taking it.  Report some ringing in his ears.  BP 124/65   Pulse 66   Temp (!) 97.5 F (36.4 C) (Oral)   Resp 18   Ht 6\' 1"  (1.854 m)   Wt 209 lb (94.8 kg)   SpO2 96%   BMI 27.57 kg/m  Soft, mildly distended, staples c/d/I and removed no erythema or drainage Steri strips placed  Patient s/p Ex lap and reduction of volvulus. Doing well but is having ringing in the ears. Also with some darker stools.    The ringing in the ears could be from the Bactrim. Stop the antibiotic and change to another one given the ringing in the ears.   Start the Augmentin to finish up your antibiotic for the urinary tract infection.   Follow up with the PCP if you continue to have ringing in your ears.   No heavy lifting > 10 lbs, excessive bending, pushing, pulling, or squatting for 6-8 weeks after surgery.   Call if you need to see [place] before then.  Get lab work today given the dark stools over the weekend and your ringing in your ears. CBC and BMP ordered.   You may need to keep the Flomax on if you have more retention after the UTI is treated. PCP can follow up with that after the UTI treatment.   Future Appointments  Date Time Provider Department Center  01/01/2024 11:45 AM Lucretia Roers, MD [facility] None   Algis Greenhouse, MD [facility] [address] [place], [place] [address] [phone] (office)

## 2023-11-28 NOTE — Patient Instructions (Addendum)
 The ringing in the ears could be from the Bactrim. Stop the antibiotic and change to another one given the ringing in the ears.   Start the Augmentin to finish up your antibiotic for the urinary tract infection.   Follow up with the PCP if you continue to have ringing in your ears.   No heavy lifting > 10 lbs, excessive bending, pushing, pulling, or squatting for 6-8 weeks after surgery.  Call if you need to see [place] before then.  Get lab work today given the dark stools over the weekend and your ringing in your ears.

## 2023-11-29 LAB — CBC WITH DIFFERENTIAL/PLATELET
Basophils Absolute: 0 10*3/uL (ref 0.0–0.2)
Basos: 1 %
EOS (ABSOLUTE): 0.2 10*3/uL (ref 0.0–0.4)
Eos: 2 %
Hematocrit: 39.3 % (ref 37.5–51.0)
Hemoglobin: 12.6 g/dL — ABNORMAL LOW (ref 13.0–17.7)
Immature Grans (Abs): 0.1 10*3/uL (ref 0.0–0.1)
Immature Granulocytes: 1 %
Lymphocytes Absolute: 1.8 10*3/uL (ref 0.7–3.1)
Lymphs: 24 %
MCH: 30.3 pg (ref 26.6–33.0)
MCHC: 32.1 g/dL (ref 31.5–35.7)
MCV: 95 fL (ref 79–97)
Monocytes Absolute: 0.5 10*3/uL (ref 0.1–0.9)
Monocytes: 7 %
Neutrophils Absolute: 4.8 10*3/uL (ref 1.4–7.0)
Neutrophils: 65 %
Platelets: 248 10*3/uL (ref 150–450)
RBC: 4.16 x10E6/uL (ref 4.14–5.80)
RDW: 13.1 % (ref 11.6–15.4)
WBC: 7.3 10*3/uL (ref 3.4–10.8)

## 2023-11-29 LAB — BASIC METABOLIC PANEL
BUN/Creatinine Ratio: 10 (ref 10–24)
BUN: 16 mg/dL (ref 8–27)
CO2: 20 mmol/L (ref 20–29)
Calcium: 8.8 mg/dL (ref 8.6–10.2)
Chloride: 101 mmol/L (ref 96–106)
Creatinine, Ser: 1.53 mg/dL — ABNORMAL HIGH (ref 0.76–1.27)
Glucose: 88 mg/dL (ref 70–99)
Potassium: 5.2 mmol/L (ref 3.5–5.2)
Sodium: 137 mmol/L (ref 134–144)
eGFR: 47 mL/min/{1.73_m2} — ABNORMAL LOW (ref >59)

## 2023-12-14 ENCOUNTER — Telehealth (HOSPITAL_COMMUNITY): Payer: Self-pay

## 2023-12-14 NOTE — Telephone Encounter (Signed)
 [facility] called stating the [facility] for patient is cancelled and no longer valid. Closing referral.

## 2023-12-14 NOTE — Telephone Encounter (Signed)
 [facility] called to confirm if [facility] has been scheduled for [facility] yet. Our last contact with patient was on 2/21 when he stated he still did not have transportation set up and would reach out to [place] when he did. [facility] stated [facility] would [facility] out to him and work on arranging transportation with him.

## 2024-01-01 ENCOUNTER — Ambulatory Visit (INDEPENDENT_AMBULATORY_CARE_PROVIDER_SITE_OTHER): Admitting: General Surgery

## 2024-01-01 ENCOUNTER — Encounter: Payer: Self-pay | Admitting: General Surgery

## 2024-01-01 VITALS — BP 130/71 | HR 64 | Temp 97.7°F | Resp 12 | Ht 73.0 in | Wt 209.0 lb

## 2024-01-01 DIAGNOSIS — K562 Volvulus: Secondary | ICD-10-CM

## 2024-01-01 DIAGNOSIS — H9313 Tinnitus, bilateral: Secondary | ICD-10-CM

## 2024-01-01 NOTE — Patient Instructions (Addendum)
 Follow up with PCP regarding the continued "hum/ ringing" in your ears. Since it did not stop when we changed the antibiotic and has continued. It is likely related to something else.   Diet and activity as tolerated.

## 2024-01-02 NOTE — Progress Notes (Signed)
 [facility]  Doing well. Still has ringing in his ears even after bactrim  stopped. He is not sure when it even started and could have been in the hospital or prior. He describes it as a hum.  He is eating and having Bms.   He has no UTI symptoms now after Augmentin .   BP 130/71   Pulse 64   Temp 97.7 F (36.5 C) (Oral)   Resp 12   Ht 6\' 1"  (1.854 m)   Wt 209 lb (94.8 kg)   SpO2 95%   BMI 27.57 kg/m  Soft, nondistended, healed incision, no hernia  No otoscope available to look into ears   Patient s/p Ex lap and reduction of volvulus. Doing well.  Last visit he was on bactrim  for UTI and had tinnitus and the bactrim  was stopped and Augmentin  completed. The ringing continues.   Follow up with PCP regarding the continued "hum/ ringing" in your ears. Since it did not stop when we changed the antibiotic and has continued. It is likely related to something else.   Had told him to see PCP last visit, he says he has an appt next week.   Diet and activity as tolerated. PRN Follow up   Deena Farrier, MD [facility] [address] [place], [place] [address] [phone] (office)

## 2024-01-31 ENCOUNTER — Inpatient Hospital Stay (HOSPITAL_COMMUNITY)

## 2024-01-31 ENCOUNTER — Encounter (HOSPITAL_COMMUNITY): Payer: Self-pay

## 2024-01-31 ENCOUNTER — Other Ambulatory Visit: Payer: Self-pay

## 2024-01-31 ENCOUNTER — Inpatient Hospital Stay (HOSPITAL_COMMUNITY)
Admission: EM | Admit: 2024-01-31 | Discharge: 2024-02-05 | DRG: 329 | Disposition: A | Discharge status: Home / Self Care | Attending: Family Medicine | Admitting: Family Medicine

## 2024-01-31 ENCOUNTER — Emergency Department (HOSPITAL_COMMUNITY)

## 2024-01-31 DIAGNOSIS — N390 Urinary tract infection, site not specified: Secondary | ICD-10-CM | POA: Diagnosis present

## 2024-01-31 DIAGNOSIS — Z720 Tobacco use: Secondary | ICD-10-CM | POA: Diagnosis present

## 2024-01-31 DIAGNOSIS — I701 Atherosclerosis of renal artery: Secondary | ICD-10-CM | POA: Diagnosis present

## 2024-01-31 DIAGNOSIS — I10 Essential (primary) hypertension: Secondary | ICD-10-CM | POA: Diagnosis present

## 2024-01-31 DIAGNOSIS — K56609 Unspecified intestinal obstruction, unspecified as to partial versus complete obstruction: Secondary | ICD-10-CM | POA: Diagnosis present

## 2024-01-31 DIAGNOSIS — Z7901 Long term (current) use of anticoagulants: Secondary | ICD-10-CM | POA: Diagnosis not present

## 2024-01-31 DIAGNOSIS — Z7982 Long term (current) use of aspirin: Secondary | ICD-10-CM | POA: Diagnosis not present

## 2024-01-31 DIAGNOSIS — F1010 Alcohol abuse, uncomplicated: Secondary | ICD-10-CM | POA: Diagnosis present

## 2024-01-31 DIAGNOSIS — E278 Other specified disorders of adrenal gland: Secondary | ICD-10-CM | POA: Diagnosis present

## 2024-01-31 DIAGNOSIS — I2510 Atherosclerotic heart disease of native coronary artery without angina pectoris: Secondary | ICD-10-CM | POA: Diagnosis present

## 2024-01-31 DIAGNOSIS — Z85820 Personal history of malignant melanoma of skin: Secondary | ICD-10-CM

## 2024-01-31 DIAGNOSIS — J9811 Atelectasis: Secondary | ICD-10-CM | POA: Diagnosis present

## 2024-01-31 DIAGNOSIS — Z77090 Contact with and (suspected) exposure to asbestos: Secondary | ICD-10-CM | POA: Diagnosis present

## 2024-01-31 DIAGNOSIS — C3492 Malignant neoplasm of unspecified part of left bronchus or lung: Secondary | ICD-10-CM | POA: Diagnosis present

## 2024-01-31 DIAGNOSIS — K562 Volvulus: Principal | ICD-10-CM | POA: Diagnosis present

## 2024-01-31 DIAGNOSIS — I480 Paroxysmal atrial fibrillation: Secondary | ICD-10-CM | POA: Diagnosis present

## 2024-01-31 DIAGNOSIS — K219 Gastro-esophageal reflux disease without esophagitis: Secondary | ICD-10-CM | POA: Diagnosis present

## 2024-01-31 DIAGNOSIS — R338 Other retention of urine: Secondary | ICD-10-CM | POA: Diagnosis not present

## 2024-01-31 DIAGNOSIS — R61 Generalized hyperhidrosis: Secondary | ICD-10-CM | POA: Diagnosis present

## 2024-01-31 DIAGNOSIS — Z8673 Personal history of transient ischemic attack (TIA), and cerebral infarction without residual deficits: Secondary | ICD-10-CM

## 2024-01-31 DIAGNOSIS — N179 Acute kidney failure, unspecified: Secondary | ICD-10-CM | POA: Diagnosis present

## 2024-01-31 DIAGNOSIS — E782 Mixed hyperlipidemia: Secondary | ICD-10-CM | POA: Diagnosis present

## 2024-01-31 DIAGNOSIS — F958 Other tic disorders: Secondary | ICD-10-CM | POA: Diagnosis present

## 2024-01-31 DIAGNOSIS — I739 Peripheral vascular disease, unspecified: Secondary | ICD-10-CM | POA: Diagnosis present

## 2024-01-31 DIAGNOSIS — J449 Chronic obstructive pulmonary disease, unspecified: Secondary | ICD-10-CM | POA: Diagnosis present

## 2024-01-31 DIAGNOSIS — Z801 Family history of malignant neoplasm of trachea, bronchus and lung: Secondary | ICD-10-CM | POA: Diagnosis not present

## 2024-01-31 DIAGNOSIS — F17210 Nicotine dependence, cigarettes, uncomplicated: Secondary | ICD-10-CM | POA: Diagnosis present

## 2024-01-31 DIAGNOSIS — Z79899 Other long term (current) drug therapy: Secondary | ICD-10-CM

## 2024-01-31 DIAGNOSIS — N9989 Other postprocedural complications and disorders of genitourinary system: Secondary | ICD-10-CM | POA: Diagnosis not present

## 2024-01-31 DIAGNOSIS — Z85828 Personal history of other malignant neoplasm of skin: Secondary | ICD-10-CM | POA: Diagnosis not present

## 2024-01-31 DIAGNOSIS — I700 Atherosclerosis of aorta: Secondary | ICD-10-CM | POA: Diagnosis present

## 2024-01-31 DIAGNOSIS — J189 Pneumonia, unspecified organism: Secondary | ICD-10-CM | POA: Diagnosis present

## 2024-01-31 DIAGNOSIS — R188 Other ascites: Secondary | ICD-10-CM | POA: Diagnosis present

## 2024-01-31 LAB — LIPASE, BLOOD: Lipase: 40 U/L (ref 11–51)

## 2024-01-31 LAB — CBC
HCT: 42.3 % (ref 39.0–52.0)
Hemoglobin: 13.3 g/dL (ref 13.0–17.0)
MCH: 29.3 pg (ref 26.0–34.0)
MCHC: 31.4 g/dL (ref 30.0–36.0)
MCV: 93.2 fL (ref 80.0–100.0)
Platelets: 194 10*3/uL (ref 150–400)
RBC: 4.54 MIL/uL (ref 4.22–5.81)
RDW: 14.1 % (ref 11.5–15.5)
WBC: 8.9 10*3/uL (ref 4.0–10.5)
nRBC: 0 % (ref 0.0–0.2)

## 2024-01-31 LAB — COMPREHENSIVE METABOLIC PANEL WITH GFR
ALT: 80 U/L — ABNORMAL HIGH (ref 0–44)
AST: 45 U/L — ABNORMAL HIGH (ref 15–41)
Albumin: 4.2 g/dL (ref 3.5–5.0)
Alkaline Phosphatase: 91 U/L (ref 38–126)
Anion gap: 10 (ref 5–15)
BUN: 17 mg/dL (ref 8–23)
CO2: 21 mmol/L — ABNORMAL LOW (ref 22–32)
Calcium: 9 mg/dL (ref 8.9–10.3)
Chloride: 104 mmol/L (ref 98–111)
Creatinine, Ser: 1.41 mg/dL — ABNORMAL HIGH (ref 0.61–1.24)
GFR, Estimated: 52 mL/min — ABNORMAL LOW (ref >60)
Glucose, Bld: 141 mg/dL — ABNORMAL HIGH (ref 70–99)
Potassium: 3.8 mmol/L (ref 3.5–5.1)
Sodium: 135 mmol/L (ref 135–145)
Total Bilirubin: 0.6 mg/dL (ref 0.0–1.2)
Total Protein: 7.8 g/dL (ref 6.5–8.1)

## 2024-01-31 MED ORDER — IOHEXOL 300 MG/ML  SOLN
100.0000 mL | Freq: Once | INTRAMUSCULAR | Status: AC | PRN
Start: 2024-01-31 — End: 2024-01-31
  Administered 2024-01-31: 100 mL via INTRAVENOUS

## 2024-01-31 MED ORDER — MORPHINE SULFATE (PF) 4 MG/ML IV SOLN
4.0000 mg | Freq: Once | INTRAVENOUS | Status: AC
  Administered 2024-01-31: 4 mg via INTRAVENOUS
  Filled 2024-01-31: qty 1

## 2024-01-31 NOTE — ED Triage Notes (Signed)
 Pt reports:  Abdomen pain Started 3-4 hours ago Sent from [facility] R/o intestinal blockage Nausea Started today Cold Sweats Started today

## 2024-01-31 NOTE — H&P (Signed)
 History and Physical    Patient: Samuel Ferrell:[id number] DOB: 06/24/1946 DOA: 01/31/2024 DOS: the patient was seen and examined on 01/31/2024 PCP: Suzzanne Estrin, MD  Patient coming from: Home  Chief Complaint:  Chief Complaint  Patient presents with   Abdominal Pain   Nausea   HPI: Samuel Ferrell is a 78 y.o. male with medical history significant of atrial fibrillation, alcohol abuse, COPD, GERD, history of CVA, hypertension, and hyperlipidemia who presented with abdominal pain, and nausea. Recent hospitalization 03/05 to 11/21/23 for small bowel obstruction due to volvulus, he required surgical intervention with improvement of his symptoms.   Patient has been doing well, at his usual state of health until today around 3 pm when he developed acute onset of abdominal pain. It has sharp in nature,10/10 in intensity and associated with nausea but not vomiting. He noted abdominal distention, diaphoresis and chills.   Because of severity and persistens of his symptoms he presented to the ED.   At the time of my examination he has a NG tube in place and continue to have abdominal pain, 10/10 in intensity, worse to touch and no improving factors. With nausea and vomiting have improved.    Review of Systems: As mentioned in the history of present illness. All other systems reviewed and are negative. Past Medical History:  Diagnosis Date   Afib [facility])    s/p ablation x2 by Dr Harwood Lingo at [facility] in 2007, 2009   Alcohol abuse    Anemia    Atypical atrial flutter (HCC)    COPD (chronic obstructive pulmonary disease) (HCC)    COVID    mild case   CVA (cerebral infarction)    GERD (gastroesophageal reflux disease)    Heart murmur    Hyperlipidemia    Hypertension    Palpitations    Pneumonia    1970's   Skin cancer--melanoma    Stroke [facility])    TIA   Past Surgical History:  Procedure Laterality Date   ABDOMINAL AORTOGRAM N/A 01/18/2023   Procedure: ABDOMINAL AORTOGRAM;   Surgeon: Young Hensen, MD;  Location: [facility] INVASIVE CV LAB;  Service: Cardiovascular;  Laterality: N/A;   ANTERIOR CERVICAL DECOMP/DISCECTOMY FUSION N/A 07/24/2018   Procedure: ANTERIOR CERVICAL DECOMPRESSION/DISCECTOMY FUSION, INTERBODY PROSTHESIS, PLATE SCREWS CERVICAL FIVE- CERVICAL SIX;  Surgeon: Garry Kansas, MD;  Location: [facility] OR;  Service: Neurosurgery;  Laterality: N/A;  ANTERIOR CERVICAL DECOMPRESSION/DISCECTOMY FUSION, INTERBODY PROSTHESIS, PLATE SCREWS CERVICAL FIVE- CERVICAL SIX   APPENDECTOMY     ATRIAL FIBRILLATION ABLATION  2007 ,2009   Dr Harwood Lingo at [facility]   BRONCHIAL BIOPSY  04/24/2022   Procedure: BRONCHIAL BIOPSIES;  Surgeon: Gloriajean Large, MD;  Location: [facility] ENDOSCOPY;  Service: Pulmonary;;   BRONCHIAL BRUSHINGS  04/24/2022   Procedure: BRONCHIAL BRUSHINGS;  Surgeon: Gloriajean Large, MD;  Location: [facility] ENDOSCOPY;  Service: Pulmonary;;   BRONCHIAL BRUSHINGS  05/19/2022   Procedure: BRONCHIAL BRUSHINGS;  Surgeon: Gloriajean Large, MD;  Location: [facility] ENDOSCOPY;  Service: Pulmonary;;   BRONCHIAL NEEDLE ASPIRATION BIOPSY  04/24/2022   Procedure: BRONCHIAL NEEDLE ASPIRATION BIOPSIES;  Surgeon: Gloriajean Large, MD;  Location: [facility] ENDOSCOPY;  Service: Pulmonary;;   BRONCHIAL NEEDLE ASPIRATION BIOPSY  05/19/2022   Procedure: BRONCHIAL NEEDLE ASPIRATION BIOPSIES;  Surgeon: Gloriajean Large, MD;  Location: [facility] ENDOSCOPY;  Service: Pulmonary;;   BRONCHIAL WASHINGS  04/24/2022   Procedure: BRONCHIAL WASHINGS;  Surgeon: Gloriajean Large, MD;  Location: [facility] ENDOSCOPY;  Service: Pulmonary;;   EYE SURGERY Bilateral  cataracts   FIDUCIAL MARKER PLACEMENT  05/19/2022   Procedure: DYE MARKING;  Surgeon: Gloriajean Large, MD;  Location: [facility] ENDOSCOPY;  Service: Pulmonary;;   FRACTURE SURGERY     right clavicle   HEMOSTASIS CONTROL  04/24/2022   Procedure: HEMOSTASIS CONTROL;  Surgeon: Gloriajean Large, MD;  Location: [facility] ENDOSCOPY;  Service: Pulmonary;;   INTERCOSTAL  NERVE BLOCK Left 05/19/2022   Procedure: INTERCOSTAL NERVE BLOCK;  Surgeon: Zelphia Higashi, MD;  Location: [facility] OR;  Service: Thoracic;  Laterality: Left;   LAPAROTOMY N/A 11/16/2023   Procedure: LAPAROTOMY, EXPLORATORY; REDUCTION OF VOLVULUS;  Surgeon: Awilda Bogus, MD;  Location: [facility] ORS;  Service: General;  Laterality: N/A;   LYMPH NODE DISSECTION Left 05/19/2022   Procedure: LYMPH NODE DISSECTION;  Surgeon: Zelphia Higashi, MD;  Location[facility] OR;  Service: Thoracic;  Laterality: Left;   PERIPHERAL VASCULAR INTERVENTION Bilateral 01/05/2022   Procedure: PERIPHERAL VASCULAR INTERVENTION;  Surgeon: Young Hensen, MD;  Location: [facility];  Service: Cardiovascular;  Laterality: Bilateral;  Bilateral Renal Artery Stents   RENAL ANGIOGRAPHY Bilateral 01/05/2022   Procedure: RENAL ARTERY STENTING;  Surgeon: Young Hensen, MD;  Location: [facility] LAB;  Service: Cardiovascular;  Laterality: Bilateral;   RENAL ANGIOGRAPHY N/A 01/18/2023   Procedure: RENAL ANGIOGRAPHY;  Surgeon: Young Hensen, MD;  Location: [facility];  Service: Cardiovascular;  Laterality: N/A;   RENAL INTERVENTION  01/18/2023   Procedure: RENAL INTERVENTION;  Surgeon: Young Hensen, MD;  Location: [facility];  Service: Cardiovascular;;   TONSILLECTOMY     VIDEO BRONCHOSCOPY WITH RADIAL ENDOBRONCHIAL ULTRASOUND  04/24/2022   Procedure: VIDEO BRONCHOSCOPY WITH RADIAL ENDOBRONCHIAL ULTRASOUND;  Surgeon: Gloriajean Large, MD;  Location: [facility] ENDOSCOPY;  Service: Pulmonary;;   VIDEO BRONCHOSCOPY WITH RADIAL ENDOBRONCHIAL ULTRASOUND  05/19/2022   Procedure: VIDEO BRONCHOSCOPY WITH RADIAL ENDOBRONCHIAL ULTRASOUND;  Surgeon: Gloriajean Large, MD;  Location: [facility] ENDOSCOPY;  Service: Pulmonary;;   Social History:  reports that he has been smoking cigarettes. He has a 61 pack-year smoking history. He has never used smokeless tobacco. He reports that he does not currently use  alcohol. He reports that he does not use drugs.  No Known Allergies  Family History  Problem Relation Age of Onset   Lung cancer Brother        smoked   Cancer Other    Colon cancer Neg Hx    Esophageal cancer Neg Hx    Rectal cancer Neg Hx    Stomach cancer Neg Hx     Prior to Admission medications   Medication Sig Start Date End Date Taking? Authorizing Provider  apixaban  (ELIQUIS ) 5 MG TABS tablet Take 5 mg by mouth 2 (two) times daily.    [provider]  aspirin  EC 81 MG tablet Take 81 mg by mouth daily. Swallow whole.    [provider]  atorvastatin  (LIPITOR ) 40 MG tablet Take 40 mg by mouth every evening.  01/26/14   [provider]  docusate sodium  (COLACE) 100 MG capsule Take 1 capsule (100 mg total) by mouth 2 (two) times daily. 11/21/23   Awilda Bogus, MD  dofetilide  (TIKOSYN ) 500 MCG capsule Take 1 capsule (500 mcg total) by mouth 2 (two) times daily. 11/01/20   Allred, Royston Cornea, MD  enalapril  (VASOTEC ) 10 MG tablet Take 1 tablet (10 mg total) by mouth at bedtime. 03/07/21   Allred, Royston Cornea, MD  ferrous sulfate  325 (65 FE) MG tablet  Take 325 mg by mouth daily with breakfast. 250mg  per patient    [provider]  Magnesium  250 MG TABS Take 250 mg by mouth every evening.    [provider]  metoprolol  tartrate (LOPRESSOR ) 50 MG tablet Take 1.5 tablets (75 mg total) by mouth 2 (two) times daily. 04/09/20   Asa Lauth, NP  Multiple Vitamin (MULTIVITAMIN) tablet Take 1 tablet by mouth daily. Centrum 50+    [provider]  omeprazole (PRILOSEC) 20 MG capsule Take 20 mg by mouth daily. 12/01/13   [provider]  ondansetron  (ZOFRAN ) 4 MG tablet Place 1 tablet (4 mg total) into feeding tube every 6 (six) hours as needed for nausea. Patient not taking: Reported on 01/01/2024 11/21/23   Awilda Bogus, MD  oxyCODONE  (OXY IR/ROXICODONE ) 5 MG immediate release tablet Take 1 tablet (5 mg total) by mouth every 4 (four)  hours as needed for severe pain (pain score 7-10) or breakthrough pain. Patient not taking: Reported on 01/01/2024 11/21/23   Awilda Bogus, MD  polyethylene glycol (MIRALAX  / GLYCOLAX ) 17 g packet Take 17 g by mouth daily as needed for moderate constipation. 11/21/23   Awilda Bogus, MD  potassium chloride  SA (KLOR-CON  M20) 20 MEQ tablet Take 1 tablet (20 mEq total) by mouth daily. 10/07/20   Allred, Royston Cornea, MD  tamsulosin  (FLOMAX ) 0.4 MG CAPS capsule Take 2 capsules (0.8 mg total) by mouth daily after breakfast. 11/21/23   Awilda Bogus, MD  Tiotropium Bromide-Olodaterol (STIOLTO RESPIMAT ) 2.5-2.5 MCG/ACT AERS Inhale 2 puffs into the lungs daily. Patient taking differently: Inhale 1 puff into the lungs 2 (two) times daily. 04/14/22   Gloriajean Large, MD  vitamin C (ASCORBIC ACID) 250 MG tablet Take 250 mg by mouth daily.    [provider]    Physical Exam: Vitals:   01/31/24 1941 01/31/24 1944 01/31/24 1955 01/31/24 2010  BP:  (!) 116/58    Pulse:  60    Resp:  20    Temp:   97.8 F (36.6 C)   TempSrc:  Temporal Rectal   SpO2:  100%    Weight:    94.8 kg  Height: 6\' 1"  (1.854 m)       Neurology awake and alert ENT with mild pallor with no icterus, NG tube in place Cardiovascular with S1 and S2 present and regular with no gallops, rubs or murmurs Respiratory with no rales or wheezing, no rhonchi  Abdomen distended and tympanic, tender to palpation with guarding but not rebound, decreased bowel sounds.  Noted midline healed surgical wound No lower extremity edema   Data Reviewed:   Na 135, K 3.8 Cl 104 bicarbonate 21, glucose 141 bun 17 cr 1.41 AST 45 ALT 80  WBC 8,9 hgb 13.3 plt 194   CT abdomen and pelvis with high grade distal small bowel obstruction secondary to mesenteric volvulus. No frankly ischemic changes of bowel. Mild ascites. No free intraperitoneal gas. Extensive coronary calcifications.  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  2,8 left adrenal nodule  Calcified pleural plaques   EKG 74 bpm, normal axis, normal intervals, qtc 465, sinus rhythm with no significant ST segment or T wave changes.   Assessment and Plan: * SBO (small bowel obstruction) (HCC) Recurrent volvulus  Plan for surgical intervention per surgery.  Continue supportive medical therapy with IV fluids, as needed analgesics and antiemetics Will add IV antiacid therapy with pantoprazole  for GI prophylaxis.  Continue NG tube to low intermittent suction.    Paroxysmal atrial fibrillation (HCC) Hold anticoagulation in preparation for surgical intervention.  If possible will continue dofetilide  and metoprolol  po.  Continue telemetry monitoring. Keep K at 4 and Mg at 2.   Essential hypertension Continue blood pressure monitoring, for now continue to hold on antihypertensive medications  Hold on enalapril .   AKI (acute kidney injury) (HCC) Continue IV fluids with isotonic saline.  Follow up renal function and electrolytes.  Will add 20 kcl IV   Alcohol abuse, daily use No signs of acute withdrawal, continue neuro checks per unit protocol.   Mixed hyperlipidemia Hold statin therapy for now    Advance Care Planning:   Code Status: Full Code   Consults: surgery per ED   Family Communication: no family at the bedside   Severity of Illness: The appropriate patient status for this patient is INPATIENT. Inpatient status is judged to be reasonable and necessary in order to provide the required intensity of service to ensure the patient's safety. The patient's presenting symptoms, physical exam findings, and initial radiographic and laboratory data in the context of their chronic comorbidities is felt to place them at high risk for further clinical deterioration. Furthermore, it is not anticipated that the patient will be medically stable for discharge from the hospital within 2 midnights of admission.   * I certify that at the  point of admission it is my clinical judgment that the patient will require inpatient hospital care spanning beyond 2 midnights from the point of admission due to high intensity of service, high risk for further deterioration and high frequency of surveillance required.*  Author: Albertus Alt, MD 01/31/2024 10:58 PM  For on call review [email or web address].

## 2024-01-31 NOTE — ED Provider Notes (Signed)
 [place] EMERGENCY DEPARTMENT AT [facility] Provider Note   CSN: [id number] Arrival date & time: 01/31/24  1909     History {Add pertinent medical, surgical, social history, OB history to HPI:1} Chief Complaint  Patient presents with   Abdominal Pain   Nausea    Samuel Ferrell is a 78 y.o. male with PMH as listed below who presents with abdominal pain that started acutely today a/w abd distension. No nausea/vomiting. Sent from [facility] for CT scan for c/f SBO. Has h/o SBO and volvulus recently requiring laparotomy. States he has severe pain today with some mild watery diarrhea, last BM was yesterday. A/w flushed feeling. No urinary sxs.    Past Medical History:  Diagnosis Date   Afib [facility])    s/p ablation x2 by Dr Harwood Lingo at [facility] in 2007, 2009   Alcohol abuse    Anemia    Atypical atrial flutter (HCC)    COPD (chronic obstructive pulmonary disease) (HCC)    COVID    mild case   CVA (cerebral infarction)    GERD (gastroesophageal reflux disease)    Heart murmur    Hyperlipidemia    Hypertension    Palpitations    Pneumonia    1970's   Skin cancer--melanoma    Stroke (HCC)    TIA       Home Medications Prior to Admission medications   Medication Sig Start Date End Date Taking? Authorizing Provider  apixaban  (ELIQUIS ) 5 MG TABS tablet Take 5 mg by mouth 2 (two) times daily.    [provider]  aspirin  EC 81 MG tablet Take 81 mg by mouth daily. Swallow whole.    [provider]  atorvastatin  (LIPITOR ) 40 MG tablet Take 40 mg by mouth every evening.  01/26/14   [provider]  docusate sodium  (COLACE) 100 MG capsule Take 1 capsule (100 mg total) by mouth 2 (two) times daily. 11/21/23   Awilda Bogus, MD  dofetilide  (TIKOSYN ) 500 MCG capsule Take 1 capsule (500 mcg total) by mouth 2 (two) times daily. 11/01/20   Allred, Royston Cornea, MD  enalapril  (VASOTEC ) 10 MG tablet Take 1 tablet (10 mg total) by mouth at bedtime. 03/07/21   Allred,  Royston Cornea, MD  ferrous sulfate  325 (65 FE) MG tablet Take 325 mg by mouth daily with breakfast. 250mg  per patient    [provider]  Magnesium  250 MG TABS Take 250 mg by mouth every evening.    [provider]  metoprolol  tartrate (LOPRESSOR ) 50 MG tablet Take 1.5 tablets (75 mg total) by mouth 2 (two) times daily. 04/09/20   Asa Lauth, NP  Multiple Vitamin (MULTIVITAMIN) tablet Take 1 tablet by mouth daily. Centrum 50+    [provider]  omeprazole (PRILOSEC) 20 MG capsule Take 20 mg by mouth daily. 12/01/13   [provider]  ondansetron  (ZOFRAN ) 4 MG tablet Place 1 tablet (4 mg total) into feeding tube every 6 (six) hours as needed for nausea. Patient not taking: Reported on 01/01/2024 11/21/23   Awilda Bogus, MD  oxyCODONE  (OXY IR/ROXICODONE ) 5 MG immediate release tablet Take 1 tablet (5 mg total) by mouth every 4 (four) hours as needed for severe pain (pain score 7-10) or breakthrough pain. Patient not taking: Reported on 01/01/2024 11/21/23   Awilda Bogus, MD  polyethylene glycol (MIRALAX  / GLYCOLAX ) 17 g packet Take 17 g by mouth daily as needed for moderate constipation. 11/21/23   Awilda Bogus, MD  potassium chloride  SA (KLOR-CON  M20) 20 MEQ tablet Take 1 tablet (20 mEq total) by mouth daily. 10/07/20   Allred, Royston Cornea, MD  tamsulosin  (FLOMAX ) 0.4 MG CAPS capsule Take 2 capsules (0.8 mg total) by mouth daily after breakfast. 11/21/23   Awilda Bogus, MD  Tiotropium Bromide-Olodaterol (STIOLTO RESPIMAT ) 2.5-2.5 MCG/ACT AERS Inhale 2 puffs into the lungs daily. Patient taking differently: Inhale 1 puff into the lungs 2 (two) times daily. 04/14/22   Gloriajean Large, MD  vitamin C (ASCORBIC ACID) 250 MG tablet Take 250 mg by mouth daily.    [provider]      Allergies    Patient has no known allergies.    Review of Systems   Review of Systems A 10 point review of systems was performed and is negative unless otherwise  reported in HPI.  Physical Exam Updated Vital Signs BP (!) 116/58   Pulse 60   Temp 97.8 F (36.6 C) (Rectal)   Resp 20   Ht 6\' 1"  (1.854 m)   Wt 94.8 kg   SpO2 100%   BMI 27.57 kg/m  Physical Exam General: Normal appearing male, lying in bed.  HEENT: PERRLA, Sclera anicteric, MMM, trachea midline.  Cardiology: RRR, no murmurs/rubs/gallops. BL radial and DP pulses equal bilaterally.  Resp: Normal respiratory rate and effort. CTAB, no wheezes, rhonchi, crackles.  Abd: Soft, non-tender, non-distended. No rebound tenderness or guarding.  GU: Deferred. MSK: No peripheral edema or signs of trauma. Extremities without deformity or TTP. No cyanosis or clubbing. Skin: warm, dry. No rashes or lesions. Back: No CVA tenderness Neuro: A&Ox4, CNs II-XII grossly intact. MAEs. Sensation grossly intact.  Psych: Normal mood and affect.   ED Results / Procedures / Treatments   Labs (all labs ordered are listed, but only abnormal results are displayed) Labs Reviewed  LIPASE, BLOOD  COMPREHENSIVE METABOLIC PANEL WITH GFR  CBC  URINALYSIS, ROUTINE W REFLEX MICROSCOPIC    EKG None  Radiology No results found.  Procedures Procedures  {Document cardiac monitor, telemetry assessment procedure when appropriate:1}  Medications Ordered in ED Medications - No data to display  ED Course/ Medical Decision Making/ A&P                          Medical Decision Making Amount and/or Complexity of Data Reviewed Labs: ordered. Radiology: ordered.    This patient presents to the ED for concern of abd pain/distension, this involves an extensive number of treatment options, and is a complaint that carries with it a high risk of complications and morbidity.  I considered the following differential and admission for this acute, potentially life threatening condition.   MDM:    For DDX for abdominal pain includes but is not limited to:  Abdominal exam without peritoneal signs. No evidence of  acute abdomen at this time.   Greatest c/f SBO or volvulus given abd distension/pain. Low suspicion for acute hepatobiliary disease (including acute cholecystitis or cholangitis), acute pancreatitis (neg lipase), PUD (including gastric perforation), a acute appendicitis, vascular catastrophe, bowel obstruction, viscus perforation, or  diverticulitis.       Labs: I Ordered, and personally interpreted labs.  The pertinent results include:  those listed above  Imaging Studies ordered: I ordered imaging studies including CT abd pelvis I independently visualized and interpreted imaging. I agree with the radiologist interpretation  Additional history obtained from chart review.  Reevaluation: After the interventions noted above, I reevaluated the patient and found  that they have :{resolved/improved/worsened:23923::"improved"}  Social Determinants of Health: Lives independently  Disposition:  ***  Co morbidities that complicate the patient evaluation  Past Medical History:  Diagnosis Date   Afib [facility])    s/p ablation x2 by Dr Harwood Lingo at [facility] in 2007, 2009   Alcohol abuse    Anemia    Atypical atrial flutter (HCC)    COPD (chronic obstructive pulmonary disease) (HCC)    COVID    mild case   CVA (cerebral infarction)    GERD (gastroesophageal reflux disease)    Heart murmur    Hyperlipidemia    Hypertension    Palpitations    Pneumonia    1970's   Skin cancer--melanoma    Stroke (HCC)    TIA     Medicines No orders of the defined types were placed in this encounter.   I have reviewed the patients home medicines and have made adjustments as needed  Problem List / ED Course: Problem List Items Addressed This Visit   None        {Document critical care time when appropriate:1} {Document review of labs and clinical decision tools ie heart score, Chads2Vasc2 etc:1}  {Document your independent review of radiology images, and any outside records:1} {Document  your discussion with family members, caretakers, and with consultants:1} {Document social determinants of health affecting pt's care:1} {Document your decision making why or why not admission, treatments were needed:1}  This note was created using dictation software, which may contain spelling or grammatical errors.

## 2024-01-31 NOTE — ED Notes (Signed)
 Patient transported to CT

## 2024-01-31 NOTE — ED Notes (Signed)
 Portable xray at bedside.

## 2024-02-01 ENCOUNTER — Inpatient Hospital Stay (HOSPITAL_COMMUNITY): Admitting: Certified Registered"

## 2024-02-01 ENCOUNTER — Encounter (HOSPITAL_COMMUNITY): Payer: Self-pay | Admitting: Internal Medicine

## 2024-02-01 ENCOUNTER — Encounter (HOSPITAL_COMMUNITY)
Admission: EM | Disposition: A | Discharge status: Home / Self Care | Payer: Self-pay | Source: Home / Self Care | Attending: Family Medicine

## 2024-02-01 ENCOUNTER — Other Ambulatory Visit: Payer: Self-pay

## 2024-02-01 DIAGNOSIS — N179 Acute kidney failure, unspecified: Secondary | ICD-10-CM | POA: Diagnosis not present

## 2024-02-01 DIAGNOSIS — I10 Essential (primary) hypertension: Secondary | ICD-10-CM

## 2024-02-01 DIAGNOSIS — K562 Volvulus: Secondary | ICD-10-CM | POA: Diagnosis not present

## 2024-02-01 DIAGNOSIS — F1010 Alcohol abuse, uncomplicated: Secondary | ICD-10-CM | POA: Diagnosis not present

## 2024-02-01 DIAGNOSIS — K56609 Unspecified intestinal obstruction, unspecified as to partial versus complete obstruction: Secondary | ICD-10-CM

## 2024-02-01 DIAGNOSIS — F17210 Nicotine dependence, cigarettes, uncomplicated: Secondary | ICD-10-CM | POA: Diagnosis not present

## 2024-02-01 HISTORY — PX: BOWEL RESECTION: SHX1257

## 2024-02-01 HISTORY — PX: LAPAROTOMY: SHX154

## 2024-02-01 LAB — CBC
HCT: 50.9 % (ref 39.0–52.0)
Hemoglobin: 15.7 g/dL (ref 13.0–17.0)
MCH: 29.3 pg (ref 26.0–34.0)
MCHC: 30.8 g/dL (ref 30.0–36.0)
MCV: 95.1 fL (ref 80.0–100.0)
Platelets: 229 10*3/uL (ref 150–400)
RBC: 5.35 MIL/uL (ref 4.22–5.81)
RDW: 14.1 % (ref 11.5–15.5)
WBC: 7.1 10*3/uL (ref 4.0–10.5)
nRBC: 0 % (ref 0.0–0.2)

## 2024-02-01 LAB — URINALYSIS, ROUTINE W REFLEX MICROSCOPIC
Bilirubin Urine: NEGATIVE
Glucose, UA: NEGATIVE mg/dL
Hgb urine dipstick: NEGATIVE
Ketones, ur: NEGATIVE mg/dL
Nitrite: POSITIVE — AB
Protein, ur: 30 mg/dL — AB
Specific Gravity, Urine: >1.046 — ABNORMAL HIGH (ref 1.005–1.030)
WBC, UA: >50 WBC/hpf (ref 0–5)
pH: 6 (ref 5.0–8.0)

## 2024-02-01 LAB — BASIC METABOLIC PANEL WITH GFR
Anion gap: 12 (ref 5–15)
BUN: 24 mg/dL — ABNORMAL HIGH (ref 8–23)
CO2: 16 mmol/L — ABNORMAL LOW (ref 22–32)
Calcium: 8.6 mg/dL — ABNORMAL LOW (ref 8.9–10.3)
Chloride: 111 mmol/L (ref 98–111)
Creatinine, Ser: 1.71 mg/dL — ABNORMAL HIGH (ref 0.61–1.24)
GFR, Estimated: 41 mL/min — ABNORMAL LOW (ref >60)
Glucose, Bld: 164 mg/dL — ABNORMAL HIGH (ref 70–99)
Potassium: 4 mmol/L (ref 3.5–5.1)
Sodium: 139 mmol/L (ref 135–145)

## 2024-02-01 LAB — MAGNESIUM: Magnesium: 2 mg/dL (ref 1.7–2.4)

## 2024-02-01 SURGERY — LAPAROTOMY, EXPLORATORY
Anesthesia: General | Site: Abdomen

## 2024-02-01 MED ORDER — THIAMINE HCL 100 MG/ML IJ SOLN
100.0000 mg | Freq: Every day | INTRAMUSCULAR | Status: DC
  Filled 2024-02-01: qty 2

## 2024-02-01 MED ORDER — POTASSIUM CHLORIDE 10 MEQ/100ML IV SOLN
10.0000 meq | INTRAVENOUS | Status: AC
  Administered 2024-02-01 (×2): 10 meq via INTRAVENOUS
  Filled 2024-02-01 (×2): qty 100

## 2024-02-01 MED ORDER — ORAL CARE MOUTH RINSE: 15.0000 mL | Freq: Once | OROMUCOSAL | Status: AC

## 2024-02-01 MED ORDER — CHLORHEXIDINE GLUCONATE 4 % EX SOLN: 60.0000 mL | Freq: Once | CUTANEOUS | Status: DC

## 2024-02-01 MED ORDER — LACTATED RINGERS IV SOLN: INTRAVENOUS | Status: DC

## 2024-02-01 MED ORDER — THIAMINE MONONITRATE 100 MG PO TABS
100.0000 mg | ORAL_TABLET | Freq: Every day | ORAL | Status: DC
  Administered 2024-02-02 – 2024-02-05 (×4): 100 mg via ORAL
  Filled 2024-02-01 (×5): qty 1

## 2024-02-01 MED ORDER — PROPOFOL 10 MG/ML IV BOLUS
INTRAVENOUS | Status: DC | PRN
  Administered 2024-02-01: 90 mg via INTRAVENOUS

## 2024-02-01 MED ORDER — METOPROLOL TARTRATE 50 MG PO TABS
75.0000 mg | ORAL_TABLET | Freq: Two times a day (BID) | ORAL | Status: DC
  Administered 2024-02-01 – 2024-02-05 (×9): 75 mg via ORAL
  Filled 2024-02-01 (×10): qty 1

## 2024-02-01 MED ORDER — SODIUM CHLORIDE 0.9 % IV SOLN
2.0000 g | Freq: Once | INTRAVENOUS | Status: AC
  Administered 2024-02-01: 2 g via INTRAVENOUS
  Filled 2024-02-01: qty 2

## 2024-02-01 MED ORDER — FOLIC ACID 1 MG PO TABS
1.0000 mg | ORAL_TABLET | Freq: Every day | ORAL | Status: DC
  Administered 2024-02-02 – 2024-02-05 (×4): 1 mg via ORAL
  Filled 2024-02-01 (×4): qty 1

## 2024-02-01 MED ORDER — MOMETASONE FURO-FORMOTEROL FUM 200-5 MCG/ACT IN AERO: 2.0000 | INHALATION_SPRAY | Freq: Two times a day (BID) | RESPIRATORY_TRACT | Status: DC

## 2024-02-01 MED ORDER — SODIUM CHLORIDE 0.9 % IV SOLN
2.0000 g | INTRAVENOUS | Status: AC
  Administered 2024-02-01: 2 g via INTRAVENOUS

## 2024-02-01 MED ORDER — MORPHINE SULFATE (PF) 2 MG/ML IV SOLN
2.0000 mg | INTRAVENOUS | Status: DC | PRN
  Administered 2024-02-01 – 2024-02-02 (×15): 2 mg via INTRAVENOUS
  Filled 2024-02-01 (×15): qty 1

## 2024-02-01 MED ORDER — SODIUM CHLORIDE 0.9 % IV SOLN
INTRAVENOUS | Status: AC
  Filled 2024-02-01: qty 2

## 2024-02-01 MED ORDER — UMECLIDINIUM BROMIDE 62.5 MCG/ACT IN AEPB
1.0000 | INHALATION_SPRAY | Freq: Every day | RESPIRATORY_TRACT | Status: DC
  Administered 2024-02-02 – 2024-02-05 (×4): 1 via RESPIRATORY_TRACT
  Filled 2024-02-01: qty 7

## 2024-02-01 MED ORDER — ACETAMINOPHEN 650 MG RE SUPP: 650.0000 mg | Freq: Four times a day (QID) | RECTAL | Status: DC | PRN

## 2024-02-01 MED ORDER — BUPIVACAINE HCL (PF) 0.5 % IJ SOLN
INTRAMUSCULAR | Status: AC
  Filled 2024-02-01: qty 30

## 2024-02-01 MED ORDER — ONDANSETRON HCL 4 MG/2ML IJ SOLN
4.0000 mg | Freq: Four times a day (QID) | INTRAMUSCULAR | Status: DC | PRN
  Administered 2024-02-01 – 2024-02-03 (×5): 4 mg via INTRAVENOUS
  Filled 2024-02-01 (×4): qty 2

## 2024-02-01 MED ORDER — LIDOCAINE 2% (20 MG/ML) 5 ML SYRINGE
INTRAMUSCULAR | Status: AC
  Filled 2024-02-01: qty 5

## 2024-02-01 MED ORDER — IPRATROPIUM-ALBUTEROL 0.5-2.5 (3) MG/3ML IN SOLN
RESPIRATORY_TRACT | Status: AC
  Filled 2024-02-01: qty 3

## 2024-02-01 MED ORDER — ROCURONIUM BROMIDE 10 MG/ML (PF) SYRINGE
PREFILLED_SYRINGE | INTRAVENOUS | Status: DC | PRN
Start: 2024-02-01 — End: 2024-02-01
  Administered 2024-02-01: 60 mg via INTRAVENOUS

## 2024-02-01 MED ORDER — ARFORMOTEROL TARTRATE 15 MCG/2ML IN NEBU
15.0000 ug | INHALATION_SOLUTION | Freq: Two times a day (BID) | RESPIRATORY_TRACT | Status: DC
  Administered 2024-02-01 – 2024-02-05 (×8): 15 ug via RESPIRATORY_TRACT
  Filled 2024-02-01 (×8): qty 2

## 2024-02-01 MED ORDER — SEVOFLURANE IN SOLN
RESPIRATORY_TRACT | Status: AC
  Filled 2024-02-01: qty 250

## 2024-02-01 MED ORDER — FENTANYL CITRATE (PF) 100 MCG/2ML IJ SOLN
INTRAMUSCULAR | Status: AC
  Filled 2024-02-01: qty 2

## 2024-02-01 MED ORDER — FENTANYL CITRATE PF 50 MCG/ML IJ SOSY
25.0000 ug | PREFILLED_SYRINGE | INTRAMUSCULAR | Status: DC | PRN
  Administered 2024-02-01 (×4): 50 ug via INTRAVENOUS
  Filled 2024-02-01 (×4): qty 1

## 2024-02-01 MED ORDER — CHLORHEXIDINE GLUCONATE 0.12 % MT SOLN
15.0000 mL | Freq: Once | OROMUCOSAL | Status: AC
  Administered 2024-02-01: 15 mL via OROMUCOSAL

## 2024-02-01 MED ORDER — ENOXAPARIN SODIUM 40 MG/0.4ML IJ SOSY: 40.0000 mg | PREFILLED_SYRINGE | INTRAMUSCULAR | Status: DC

## 2024-02-01 MED ORDER — ORAL CARE MOUTH RINSE: 15.0000 mL | Freq: Once | OROMUCOSAL | Status: DC

## 2024-02-01 MED ORDER — IPRATROPIUM-ALBUTEROL 0.5-2.5 (3) MG/3ML IN SOLN
RESPIRATORY_TRACT | Status: AC
Start: 2024-02-01 — End: 2024-02-01
  Administered 2024-02-01: 3 mL via RESPIRATORY_TRACT
  Filled 2024-02-01: qty 3

## 2024-02-01 MED ORDER — PANTOPRAZOLE SODIUM 40 MG IV SOLR
40.0000 mg | INTRAVENOUS | Status: DC
  Administered 2024-02-01 – 2024-02-05 (×5): 40 mg via INTRAVENOUS
  Filled 2024-02-01 (×5): qty 10

## 2024-02-01 MED ORDER — LORAZEPAM 1 MG PO TABS: 1.0000 mg | ORAL_TABLET | ORAL | Status: DC | PRN

## 2024-02-01 MED ORDER — DEXTROSE-SODIUM CHLORIDE 5-0.9 % IV SOLN: INTRAVENOUS | Status: DC

## 2024-02-01 MED ORDER — FENTANYL CITRATE PF 50 MCG/ML IJ SOSY: 50.0000 ug | PREFILLED_SYRINGE | Freq: Once | INTRAMUSCULAR | Status: DC

## 2024-02-01 MED ORDER — ALBUTEROL SULFATE (2.5 MG/3ML) 0.083% IN NEBU
2.5000 mg | INHALATION_SOLUTION | RESPIRATORY_TRACT | Status: DC | PRN
Start: 2024-02-01 — End: 2024-02-05
  Administered 2024-02-01 – 2024-02-02 (×2): 2.5 mg via RESPIRATORY_TRACT
  Filled 2024-02-01 (×2): qty 3

## 2024-02-01 MED ORDER — LORAZEPAM 2 MG/ML IJ SOLN: 1.0000 mg | INTRAMUSCULAR | Status: DC | PRN

## 2024-02-01 MED ORDER — DOFETILIDE 500 MCG PO CAPS
500.0000 ug | ORAL_CAPSULE | Freq: Two times a day (BID) | ORAL | Status: DC
  Administered 2024-02-01 – 2024-02-02 (×5): 500 ug via ORAL
  Filled 2024-02-01 (×5): qty 1

## 2024-02-01 MED ORDER — PHENOL 1.4 % MT LIQD
1.0000 | OROMUCOSAL | Status: DC | PRN
  Administered 2024-02-01: 1 via OROMUCOSAL
  Filled 2024-02-01: qty 177

## 2024-02-01 MED ORDER — CHLORHEXIDINE GLUCONATE 0.12 % MT SOLN: 15.0000 mL | Freq: Once | OROMUCOSAL | Status: DC

## 2024-02-01 MED ORDER — SODIUM CHLORIDE 0.9 % IR SOLN
Status: DC | PRN
  Administered 2024-02-01 (×4): 1000 mL

## 2024-02-01 MED ORDER — PHENYLEPHRINE 80 MCG/ML (10ML) SYRINGE FOR IV PUSH (FOR BLOOD PRESSURE SUPPORT)
PREFILLED_SYRINGE | INTRAVENOUS | Status: DC | PRN
  Administered 2024-02-01 (×3): 100 ug via INTRAVENOUS
  Administered 2024-02-01 (×2): 80 ug via INTRAVENOUS
  Administered 2024-02-01: 100 ug via INTRAVENOUS

## 2024-02-01 MED ORDER — LACTATED RINGERS IV BOLUS
1000.0000 mL | Freq: Once | INTRAVENOUS | Status: AC
  Administered 2024-02-01: 1000 mL via INTRAVENOUS

## 2024-02-01 MED ORDER — ROCURONIUM BROMIDE 10 MG/ML (PF) SYRINGE
PREFILLED_SYRINGE | INTRAVENOUS | Status: AC
  Filled 2024-02-01: qty 10

## 2024-02-01 MED ORDER — ONDANSETRON HCL 4 MG PO TABS: 4.0000 mg | ORAL_TABLET | Freq: Four times a day (QID) | ORAL | Status: DC | PRN

## 2024-02-01 MED ORDER — SUGAMMADEX SODIUM 200 MG/2ML IV SOLN
INTRAVENOUS | Status: DC | PRN
  Administered 2024-02-01: 200 mg via INTRAVENOUS

## 2024-02-01 MED ORDER — IPRATROPIUM-ALBUTEROL 0.5-2.5 (3) MG/3ML IN SOLN: 3.0000 mL | Freq: Once | RESPIRATORY_TRACT | Status: AC

## 2024-02-01 MED ORDER — PROTHROMBIN COMPLEX CONC HUMAN 500 UNITS IV KIT
4304.0000 [IU] | PACK | Status: AC
  Administered 2024-02-01: 4304 [IU] via INTRAVENOUS
  Filled 2024-02-01: qty 4304

## 2024-02-01 MED ORDER — ALBUTEROL SULFATE (2.5 MG/3ML) 0.083% IN NEBU: 2.5000 mg | INHALATION_SOLUTION | Freq: Once | RESPIRATORY_TRACT | Status: DC

## 2024-02-01 MED ORDER — BUPIVACAINE HCL (PF) 0.5 % IJ SOLN
INTRAMUSCULAR | Status: DC | PRN
  Administered 2024-02-01: 30 mL

## 2024-02-01 MED ORDER — ADULT MULTIVITAMIN W/MINERALS CH
1.0000 | ORAL_TABLET | Freq: Every day | ORAL | Status: DC
  Administered 2024-02-02 – 2024-02-05 (×4): 1 via ORAL
  Filled 2024-02-01 (×4): qty 1

## 2024-02-01 MED ORDER — ROCURONIUM BROMIDE 10 MG/ML (PF) SYRINGE
PREFILLED_SYRINGE | INTRAVENOUS | Status: AC
  Filled 2024-02-01: qty 20

## 2024-02-01 MED ORDER — ACETAMINOPHEN 325 MG PO TABS: 650.0000 mg | ORAL_TABLET | Freq: Four times a day (QID) | ORAL | Status: DC | PRN

## 2024-02-01 MED ORDER — FENTANYL CITRATE (PF) 100 MCG/2ML IJ SOLN
INTRAMUSCULAR | Status: DC | PRN
  Administered 2024-02-01: 25 ug via INTRAVENOUS
  Administered 2024-02-01: 50 ug via INTRAVENOUS
  Administered 2024-02-01: 25 ug via INTRAVENOUS

## 2024-02-01 MED ORDER — DEXMEDETOMIDINE HCL IN NACL 80 MCG/20ML IV SOLN
INTRAVENOUS | Status: DC | PRN
Start: 2024-02-01 — End: 2024-02-01
  Administered 2024-02-01 (×2): 8 ug via INTRAVENOUS

## 2024-02-01 SURGICAL SUPPLY — 38 items
CHLORAPREP W/TINT 26 (MISCELLANEOUS) ×1 IMPLANT
CLOTH BEACON ORANGE TIMEOUT ST (SAFETY) ×1 IMPLANT
COVER LIGHT HANDLE STERIS (MISCELLANEOUS) ×2 IMPLANT
DRSG OPSITE POSTOP 4X10 (GAUZE/BANDAGES/DRESSINGS) IMPLANT
ELECTRODE REM PT RTRN 9FT ADLT (ELECTROSURGICAL) ×1 IMPLANT
GLOVE BIO SURGEON STRL SZ 6.5 (GLOVE) ×2 IMPLANT
GLOVE BIO SURGEON STRL SZ7 (GLOVE) IMPLANT
GLOVE BIOGEL PI IND STRL 6.5 (GLOVE) ×1 IMPLANT
GLOVE BIOGEL PI IND STRL 7.0 (GLOVE) ×3 IMPLANT
GLOVE SURG SS PI 7.5 STRL IVOR (GLOVE) IMPLANT
GOWN STRL REUS W/TWL LRG LVL3 (GOWN DISPOSABLE) ×3 IMPLANT
HANDLE SUCTION POOLE (INSTRUMENTS) ×1 IMPLANT
INST SET MAJOR GENERAL (KITS) ×1 IMPLANT
KIT TURNOVER KIT A (KITS) ×1 IMPLANT
LIGASURE IMPACT 36 18CM CVD LR (INSTRUMENTS) IMPLANT
MANIFOLD NEPTUNE II (INSTRUMENTS) ×1 IMPLANT
NDL HYPO 18GX1.5 BLUNT FILL (NEEDLE) ×1 IMPLANT
NDL HYPO 21X1.5 SAFETY (NEEDLE) ×1 IMPLANT
NEEDLE HYPO 18GX1.5 BLUNT FILL (NEEDLE) ×1 IMPLANT
NEEDLE HYPO 21X1.5 SAFETY (NEEDLE) ×1 IMPLANT
NS IRRIG 1000ML POUR BTL (IV SOLUTION) ×2 IMPLANT
PACK MAJOR ABDOMINAL (CUSTOM PROCEDURE TRAY) ×1 IMPLANT
PAD ARMBOARD POSITIONER FOAM (MISCELLANEOUS) ×1 IMPLANT
PENCIL SMOKE EVACUATOR COATED (MISCELLANEOUS) ×1 IMPLANT
POSITIONER HEAD 8X9X4 ADT (SOFTGOODS) ×1 IMPLANT
RELOAD PROXIMATE 75MM BLUE (ENDOMECHANICALS) ×2 IMPLANT
RELOAD STAPLE 75 3.8 BLU REG (ENDOMECHANICALS) IMPLANT
RETRACTOR WND ALEXIS-O 25 LRG (MISCELLANEOUS) IMPLANT
RETRACTOR WOUND ALXS 25CM LRG (MISCELLANEOUS) ×1 IMPLANT
SET BASIN LINEN APH (SET/KITS/TRAYS/PACK) ×1 IMPLANT
SPONGE T-LAP 18X18 ~~LOC~~+RFID (SPONGE) ×1 IMPLANT
STAPLER GUN LINEAR PROX 60 (STAPLE) IMPLANT
STAPLER PROXIMATE 75MM BLUE (STAPLE) IMPLANT
STAPLER VISISTAT (STAPLE) ×1 IMPLANT
SUT CHROMIC 2 0 SH (SUTURE) IMPLANT
SUT PDS AB CT VIOLET #0 27IN (SUTURE) ×2 IMPLANT
SUT SILK 3 0 SH CR/8 (SUTURE) ×1 IMPLANT
SYR 30ML LL (SYRINGE) ×2 IMPLANT

## 2024-02-01 NOTE — Anesthesia Postprocedure Evaluation (Signed)
 Anesthesia Post Note  Patient: Samuel Ferrell  Procedure(s) Performed: LAPAROTOMY, EXPLORATORY; SMALL BOWEL RESECTION; TICK REMOVAL. (Abdomen)  Patient location during evaluation: PACU Anesthesia Type: General Level of consciousness: awake and alert Pain management: pain level controlled Vital Signs Assessment: post-procedure vital signs reviewed and stable Respiratory status: spontaneous breathing, nonlabored ventilation, respiratory function stable and patient connected to nasal cannula oxygen Cardiovascular status: blood pressure returned to baseline and stable Postop Assessment: no apparent nausea or vomiting Anesthetic complications: no  No notable events documented.   Last Vitals:  Vitals:   02/01/24 1300 02/01/24 1306  BP: (!) 142/69   Pulse: (!) 111 (!) 109  Resp: (!) 25 (!) 26  Temp:    SpO2: 99% 96%    Last Pain:  Vitals:   02/01/24 1306  TempSrc:   PainSc: 8                  Beacher Limerick

## 2024-02-01 NOTE — Progress Notes (Addendum)
 [facility]  Attempted to call numbers in chart for [facility]. No Vm and no answer. Try to notify Lewis who was friend number in chart and no answer.   PRN for pain NPO NG until bowel function NG already in place preop, confirmed by CXR prior to OR and confirmed manually intraoperative   Updated team Tick removed from right eyebrown and placed in specimen container. Sent back with patient upstairs.  Deena Farrier, MD [facility] [address]ise [address]low [place], [place] [address] [phone] (office)

## 2024-02-01 NOTE — Assessment & Plan Note (Signed)
 Continue blood pressure monitoring, for now continue to hold on antihypertensive medications  Hold on enalapril .

## 2024-02-01 NOTE — Assessment & Plan Note (Signed)
 No signs of acute withdrawal, continue neuro checks per unit protocol.

## 2024-02-01 NOTE — Assessment & Plan Note (Addendum)
 Hold anticoagulation in preparation for surgical intervention.  If possible will continue dofetilide  and metoprolol  po.  Continue telemetry monitoring. Keep K at 4 and Mg at 2.

## 2024-02-01 NOTE — Anesthesia Preprocedure Evaluation (Addendum)
 Anesthesia Evaluation  Patient identified by MRN, date of birth, ID band Patient awake    Reviewed: Allergy & Precautions, H&P , NPO status , Patient's Chart, lab work & pertinent test results  Airway Mallampati: II       Dental  (+) Poor Dentition, Missing   Pulmonary pneumonia, COPD, Current Smoker and Patient abstained from smoking.    + wheezing  rales    Cardiovascular hypertension, + Peripheral Vascular Disease  + dysrhythmias Atrial Fibrillation + Valvular Problems/Murmurs  Rhythm:Regular Rate:Tachycardia  EF 55-60% 2019 EKG 2025 NSR   Neuro/Psych  PSYCHIATRIC DISORDERS      CVA 2019 TIA Neuromuscular disease CVA    GI/Hepatic ,GERD  ,,(+)     substance abuse  alcohol use  Endo/Other  negative endocrine ROS    Renal/GU Renal disease  negative genitourinary   Musculoskeletal negative musculoskeletal ROS (+)    Abdominal  (+)  Abdomen: rigid and tender.   Peds negative pediatric ROS (+)  Hematology  (+) Blood dyscrasia, anemia   Anesthesia Other Findings   Reproductive/Obstetrics negative OB ROS                             Anesthesia Physical Anesthesia Plan  ASA: 4 and emergent  Anesthesia Plan: General   Post-op Pain Management:    Induction: Rapid sequence and Intravenous  PONV Risk Score and Plan:   Airway Management Planned: Oral ETT  Additional Equipment:   Intra-op Plan:   Post-operative Plan: Extubation in OR  Informed Consent: I have reviewed the patients History and Physical, chart, labs and discussed the procedure including the risks, benefits and alternatives for the proposed anesthesia with the patient or authorized representative who has indicated his/her understanding and acceptance.     Dental advisory given  Plan Discussed with:   Anesthesia Plan Comments:        Anesthesia Quick Evaluation

## 2024-02-01 NOTE — Op Note (Signed)
 [facility] Operative Note  02/01/24  Preoperative Diagnosis: Small bowel obstruction, small bowel volvulus    Postoperative Diagnosis: Small bowel obstruction, small bowel volvulus, tick on right eyebrow   Procedure(s) Performed: Exploratory laparotomy, small bowel resection, side to side anastomosis, removal of tick right eyebrow    Surgeon: Dixon Fredrickson. Collene Dawson, MD   Assistants: Alanda Allegra, MD     Anesthesia: General endotracheal   Anesthesiologist: Beacher Limerick, MD    Specimens: Small bowel suture proximal    Estimated Blood Loss: Minimal   Blood Replacement: None    Complications: None   Wound Class: Clean contaminated    Operative Indications: Mr. Cui is a 78 yo who has small bowel obstruction and small bowel volvulus. He had this in March and the bowel was reduced and was viable, so no resection was performed. He has now recurred. He reported eat a large meal prior to the episode. We discussed need for small bowel resection this operation to prevent future volvulus. He was noted to have redundant small bowel and long mesentery on the prior operation but no other pathology. Discussed risk of bleeding, infection, anastomotic leak.   Findings: Distal small bowel volvulus, small bowel all viable, redundant bowel and long mesentery, small bowel resection of distal ileum performed; 300cm of small bowel remained  Tick noted on the right eyebrow- removed and placed in container, sent up with the patient to the room    Procedure: The patient was taken to the operating room and placed supine. General endotracheal anesthesia was induced. Intravenous antibiotics were  administered per protocol.  A nasogastric tube was already positioned to decompress the stomach.  The abdomen was prepped and draped in the usual sterile fashion.   A midline incision was made and carried down to the fascia. The fascia was entered with scissors. Old suture was removed. A wound  protector was placed. The small bowel was dilated and was eviscerated. The volvulus was noted in the distal ileum and was untwisted. All the bowel was viable.  The distal ileum had an even longer mesentery than the remaining small bowel. The volvulized section was resected, ensuring that we were over 10cm from the cecum. The distal and proximal point of transection was determined and two 75 mm linear cutting staples were used to transect the bowel.  A ligasure took the mesentery. A standard side to side anastomosis was made with 75 mm linear cutting stapler. The common enterotomy was closed with a TA 60 stapler. Two crotch sutures of 3-0 silk was used and the staple line was oversewn with 3-0 Silk Lembert suture. The mesenteric defect was closed with 3-0 Chromic gut suture. Irrigation was performed. Ng was confirmed in the stomach.   The team changed gloves. The midline was closed with 0 PDS suture. Marcaine  was injected. The skin was closed with staples and a dressing was placed.   Dr. Larrie Po was assisting throughout the procedure and was present for the critical portions of the case including the resection and anastomosis.    Final inspection revealed acceptable hemostasis. All counts were correct at the end of the case.   The patient was noted to have a tick on his right eyebrow. I removed this with forceps. Removing the entire tick including the mouth.  The tick was placed in a container and sent with the patient. The patient was awakened from anesthesia and extubated without complication.  The patient went to the PACU in stable condition.   Deena Farrier,  MD [facility] [address] [place], [place] [address] [phone] (office)

## 2024-02-01 NOTE — Assessment & Plan Note (Signed)
 Continue IV fluids with isotonic saline.  Follow up renal function and electrolytes.  Will add 20 kcl IV

## 2024-02-01 NOTE — Hospital Course (Signed)
 78 y.o. male with medical history significant of atrial fibrillation, alcohol abuse, COPD, GERD, history of CVA, hypertension, and hyperlipidemia who presented with abdominal pain, and nausea.  Recent hospitalization 03/05 to 11/21/23 for small bowel obstruction due to volvulus, he required surgical intervention with improvement of his symptoms.    Patient has been doing well, at his usual state of health until today around 3 pm when he developed acute onset of abdominal pain. It has sharp in nature,10/10 in intensity and associated with nausea but not vomiting. He noted abdominal distention, diaphoresis and chills.  Because of severity and persistence of his symptoms he presented to the ED and determined to have recurrent volvulus on CT images.

## 2024-02-01 NOTE — TOC CM/SW Note (Signed)
 Transition of Care [facility]) - Inpatient Brief Assessment   Patient Details  Name: Samuel Ferrell MRN: [id number] Date of Birth: [birth year]  Transition of Care [facility]) CM/SW Contact:    Grandville Lax, LCSWA Phone Number: 02/01/2024, 8:49 AM   Clinical Narrative: [facility] notified [facility] admission. [facility] notification ID is [id number].   Transition of Care Department [facility]) has reviewed patient and no TOC needs have been identified at this time. We will continue to monitor patient advancement through interdiciplinary progression rounds. If new patient transition needs arise, please place a TOC consult.   Transition of Care Asessment: Insurance and Status: Insurance coverage has been reviewed Patient has primary care physician: Yes Home environment has been reviewed: From home Prior level of function:: Independent Prior/Current Home Services: No current home services Social Drivers of Health Review: SDOH reviewed no interventions necessary Readmission risk has been reviewed: Yes Transition of care needs: no transition of care needs at this time

## 2024-02-01 NOTE — Plan of Care (Signed)

## 2024-02-01 NOTE — Progress Notes (Addendum)
 PROGRESS NOTE   Samuel Ferrell  ZOX:[id number] DOB: [birth year] DOA: 01/31/2024 PCP: Tisovec, Richard W, MD   Chief Complaint  Patient presents with   Abdominal Pain   Nausea   Level of care: Telemetry  Brief Admission History:  78 y.o. male with medical history significant of atrial fibrillation, alcohol abuse, COPD, GERD, history of CVA, hypertension, and hyperlipidemia who presented with abdominal pain, and nausea.  Recent hospitalization 03/05 to 11/21/23 for small bowel obstruction due to volvulus, he required surgical intervention with improvement of his symptoms.    Patient has been doing well, at his usual state of health until today around 3 pm when he developed acute onset of abdominal pain. It has sharp in nature,10/10 in intensity and associated with nausea but not vomiting. He noted abdominal distention, diaphoresis and chills.  Because of severity and persistence of his symptoms he presented to the ED and determined to have recurrent volvulus on CT images.     Assessment and Plan:  SBO (small bowel obstruction) Recurrent volvulus - with plan to go to OR later today with Dr. Collene Dawson for operative repair He is being treated with Lorenzo Romberg to reverse fully apixaban    Continue supportive medical therapy with IV fluids, as needed analgesics and antiemetics Will add IV antiacid therapy with pantoprazole  for GI prophylaxis.  Continue NG tube to low intermittent suction.    Mixed hyperlipidemia Hold statin therapy for now  Essential hypertension Continue blood pressure monitoring, for now continue to hold on antihypertensive medications  Hold on enalapril .   AKI (acute kidney injury)  Continue IV fluids with isotonic saline.    Paroxysmal atrial fibrillation  Hold anticoagulation for surgery   If possible will continue dofetilide  and metoprolol  po.  Continue telemetry monitoring. Keep K at 4 and Mg at 2.   Alcohol abuse, daily use No signs of acute withdrawal CIWA  protocol   DVT prophylaxis: SCDs Code Status: Full  Family Communication:  Disposition: TBD    Consultants:   Procedures:   Antimicrobials:    Subjective: Pt says he is in a lot of pain and discomfort and he is eager to move forward with surgery for relief.   Objective: Vitals:   02/01/24 0002 02/01/24 0313 02/01/24 0508 02/01/24 0942  BP:  (!) 163/71 (!) 156/88 104/67  Pulse:  (!) 109 (!) 121 (!) 116  Resp:  (!) 22 20 (!) 23  Temp:  97.8 F (36.6 C) 98 F (36.7 C) 98.1 F (36.7 C)  TempSrc:  Oral Oral Oral  SpO2: 97% 98% 98% 96%  Weight:      Height:        Intake/Output Summary (Last 24 hours) at 02/01/2024 1043 Last data filed at 02/01/2024 1023 Gross per 24 hour  Intake 916.85 ml  Output 800 ml  Net 116.85 ml   Filed Weights   01/31/24 2010  Weight: 94.8 kg   Examination:  General exam: Appears calm and comfortable  Respiratory system: Clear to auscultation. Respiratory effort normal. Cardiovascular system: normal S1 & S2 heard. No JVD, murmurs, rubs, gallops or clicks. No pedal edema. Gastrointestinal system: Abdomen is distended, and diffusely tender. No organomegaly or masses felt. hypoactive bowel sounds heard. Central nervous system: Alert and oriented. No focal neurological deficits. Extremities: Symmetric 5 x 5 power. Skin: No rashes, lesions or ulcers. Psychiatry: Judgement and insight appear normal. Mood & affect appropriate.   Data Reviewed: I have personally reviewed following labs and imaging studies  CBC: Recent Labs  Lab 01/31/24 2020 02/01/24 0439  WBC 8.9 7.1  HGB 13.3 15.7  HCT 42.3 50.9  MCV 93.2 95.1  PLT 194 229    Basic Metabolic Panel: Recent Labs  Lab 01/31/24 2020 02/01/24 0439  NA 135 139  K 3.8 4.0  CL 104 111  CO2 21* 16*  GLUCOSE 141* 164*  BUN 17 24*  CREATININE 1.41* 1.71*  CALCIUM  9.0 8.6*  MG  --  2.0    CBG: No results for input(s): "GLUCAP" in the last 168 hours.  No results found for this or  any previous visit (from the past 240 hours).   Radiology Studies: DG Abd Portable 1 View Result Date: 01/31/2024 CLINICAL DATA:  NG tube placement EXAM: PORTABLE ABDOMEN - 1 VIEW COMPARISON:  CT 01/31/2024 FINDINGS: Enteric tube tip and side port overlie the proximal stomach. Dilated air-filled small bowel corresponding to history of bowel obstruction IMPRESSION: Enteric tube tip and side port overlie the proximal stomach. Electronically Signed   By: Esmeralda Hedge M.D.   On: 01/31/2024 23:29   CT ABDOMEN PELVIS W CONTRAST Result Date: 01/31/2024 CLINICAL DATA:  Bowel obstruction EXAM: CT ABDOMEN AND PELVIS WITH CONTRAST TECHNIQUE: Multidetector CT imaging of the abdomen and pelvis was performed using the standard protocol following bolus administration of intravenous contrast. RADIATION DOSE REDUCTION: This exam was performed according to the departmental dose-optimization program which includes automated exposure control, adjustment of the mA and/or kV according to patient size and/or use of iterative reconstruction technique. CONTRAST:  OMNIPAQUE  IOHEXOL  300 MG/ML  SOLN COMPARISON:  None Available. FINDINGS: Lower chest: No acute abnormality. Extensive right coronary artery calcification. Calcified pleural plaques are seen at the lung bases bilaterally, nonspecific. Hepatobiliary: Simple cysts scattered throughout the liver. Liver otherwise unremarkable. Gallbladder unremarkable. No intra or extrahepatic biliary ductal dilation. Pancreas: Unremarkable Spleen: Normal in size without focal abnormality. 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 Right adrenal gland is unremarkable. Layering calcification is seen within a simple cyst within the left kidney. Simple cortical cyst is in lower the right kidney. Mild  asymmetric left renal cortical atrophy. The kidneys are otherwise unremarkable. Bladder. Stomach/Bowel: There is a distal small-bowel obstruction secondary to mesenteric volvulus with the small bowel mesentery rotated 2 complete resolutions within right lower quadrant. The proximal small bowel is dilated and fluid-filled and the distal terminal ileum and colon are decompressed in keeping with a complete small obstruction. No frankly ischemic segments of bowel. Mild ascites. No free intraperitoneal gas. The stomach small bowel, large otherwise unremarkable. The appendix is absent. Vascular/Lymphatic: Extensive aortoiliac atherosclerotic calcification. Left renal artery stenting has been performed. 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 No pathologic adenopathy within the abdomen and pelvis. Reproductive: Prostate is unremarkable. Other: Small bilateral fat containing inguinal hernias. Musculoskeletal: No acute bone abnormality. No lytic or blastic bone lesion. Osseous structures are age appropriate. IMPRESSION: 1. 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 No frankly ischemic segments of bowel. Mild ascites. No free intraperitoneal gas. 2. Extensive coronary artery calcification. 3. 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 4. 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 Correlation with serum laboratory examination, including serum metanephrines, may be helpful to  exclude alternative considerations. 5. 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 Aortic Atherosclerosis (ICD10-I70.0). Electronically Signed   By: Worthy Heads M.D.   On: 01/31/2024 22:13    Scheduled Meds:  dofetilide   500 mcg Oral BID   metoprolol  tartrate  75  mg Oral BID   pantoprazole  (PROTONIX ) IV  40 mg Intravenous Q24H   Continuous Infusions:  dextrose  5 % and 0.9 % NaCl 100 mL/hr at 02/01/24 1023     LOS: 1 day   Time spent: 55 mins  Devan Babino Lincoln Renshaw, MD How to contact the [facility] Attending or Consulting provider 7A - 7P or covering provider during after hours 7P -7A, for this patient?  Check the care team in [facility] and look for a) attending/consulting [facility] provider listed and b) the [facility] team listed Log into [email or web address] to find provider on call.  Locate the [facility] provider you are looking for under [facility] and page to a number that you can be directly reached. If you still have difficulty reaching the provider, please page the [facility] (Director on Call) for the Hospitalists listed on amion for assistance.  02/01/2024, 10:43 AM

## 2024-02-01 NOTE — Consult Note (Addendum)
 I was present with the medical student for this service. I personally verified the history of present illness, performed the physical exam, and made the plan for this encounter. I have verified the medical student's documentation and made modifications where appropriately. I have personally documented in my own words a brief history, physical, and plan below.     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 He had no pathology to cause or explain volvulus. He had very redundant small bowel with a long mesentery. This definitely put him at risk for volvulus. I have found a paper discussing SB volvulus ([employer] Journal of Medical and [employer] [phone]) and being fasting and eating heavy meals can predispose them to this phenomenon. There is argument for reduction versus pexy versus resection. Given that this is his second volvulus and knowing his anatomy, I am going to resect small bowel today to prevent this from recurring.  Discussed risk of bleeding, infection, anastomotic leak. He has received Kcentra for his eliquis .   Soft, distended, tender but no rebound or guarding  Deena Farrier, MD [facility] [address] Anise Barlow [place], [place] [address] [phone] (office)   Reason for Consult: Small Bowel Obstruction Referring Physician: Rayfield Cairo MD.  Samuel Ferrell is an 78 y.o. male.   HPI:   The patient is a 78 year old male with a medical history notable for atrial fibrillation, alcohol abuse, COPD, GERD, a history of CVA, hypertension, and hyperlipidemia. He was admitted to the hospital due to abdominal pain and nausea, which were determined to be secondary to a small bowel obstruction diagnosed via CT scan.  The patient had been in his usual state of health until approximately 3 PM yesterday, when he experienced an acute onset of sharp abdominal pain located in the left lower quadrant, rated at 10/10 in intensity.  This pain was accompanied by nausea, but he did not experience vomiting. He also reported abdominal distention, diaphoresis, and chills. As of this morning, the abdominal pain and distention have persisted, although the chills have subsided. The abdominal pain has been managed with morphine . Since the onset of symptoms, the patient has not had a bowel movement, but he is able to pass flatus and urinate. He has not consumed any food or drink but uses an oral swab to keep his mouth moist. The patient's last dose of Eliquis  was on Wednesday evening at 7 PM, and his last dose of aspirin  was on Thursday morning.  The patient's surgical history includes an exploratory laparotomy in March 2025 for small bowel volvulus, an appendectomy in 1970, kidney stents placed in 2023, a lung biopsy, and cardiac ablation for atrial fibrillation. He has a significant smoking history of 61 pack-years and denies current alcohol use. The patient denies experiencing fever, chest pain, shortness of breath, diarrhea, hematochezia, melena, vomiting, and dysuria.  Past Medical History:  Diagnosis Date   Afib [facility])    s/p ablation x2 by Dr Harwood Lingo at [facility] in 2007, 2009   Alcohol abuse    Anemia    Atypical atrial flutter (HCC)    COPD (chronic obstructive pulmonary disease) (HCC)    COVID    mild case   CVA (cerebral infarction)    GERD (gastroesophageal reflux disease)    Heart murmur    Hyperlipidemia    Hypertension    Palpitations    Pneumonia    1970's   Skin cancer--melanoma    Stroke (HCC)  TIA    Past Surgical History:  Procedure Laterality Date   ABDOMINAL AORTOGRAM N/A 01/18/2023   Procedure: ABDOMINAL AORTOGRAM;  Surgeon: Young Hensen, MD;  Location: [facility] INVASIVE CV LAB;  Service: Cardiovascular;  Laterality: N/A;   ANTERIOR CERVICAL DECOMP/DISCECTOMY FUSION N/A 07/24/2018   Procedure: ANTERIOR CERVICAL DECOMPRESSION/DISCECTOMY FUSION, INTERBODY PROSTHESIS, PLATE SCREWS CERVICAL FIVE-  CERVICAL SIX;  Surgeon: Garry Kansas, MD;  Location: [facility] OR;  Service: Neurosurgery;  Laterality: N/A;  ANTERIOR CERVICAL DECOMPRESSION/DISCECTOMY FUSION, INTERBODY PROSTHESIS, PLATE SCREWS CERVICAL FIVE- CERVICAL SIX   APPENDECTOMY     ATRIAL FIBRILLATION ABLATION  2007 ,2009   Dr Harwood Lingo at [facility]   BRONCHIAL BIOPSY  04/24/2022   Procedure: BRONCHIAL BIOPSIES;  Surgeon: Gloriajean Large, MD;  Location: [facility] ENDOSCOPY;  Service: Pulmonary;;   BRONCHIAL BRUSHINGS  04/24/2022   Procedure: BRONCHIAL BRUSHINGS;  Surgeon: Gloriajean Large, MD;  Location: [facility] ENDOSCOPY;  Service: Pulmonary;;   BRONCHIAL BRUSHINGS  05/19/2022   Procedure: BRONCHIAL BRUSHINGS;  Surgeon: Gloriajean Large, MD;  Location: [facility] ENDOSCOPY;  Service: Pulmonary;;   BRONCHIAL NEEDLE ASPIRATION BIOPSY  04/24/2022   Procedure: BRONCHIAL NEEDLE ASPIRATION BIOPSIES;  Surgeon: Gloriajean Large, MD;  Location: [facility] ENDOSCOPY;  Service: Pulmonary;;   BRONCHIAL NEEDLE ASPIRATION BIOPSY  05/19/2022   Procedure: BRONCHIAL NEEDLE ASPIRATION BIOPSIES;  Surgeon: Gloriajean Large, MD;  Location: [facility] ENDOSCOPY;  Service: Pulmonary;;   BRONCHIAL WASHINGS  04/24/2022   Procedure: BRONCHIAL WASHINGS;  Surgeon: Gloriajean Large, MD;  Location: [facility] ENDOSCOPY;  Service: Pulmonary;;   EYE SURGERY Bilateral    cataracts   FIDUCIAL MARKER PLACEMENT  05/19/2022   Procedure: DYE MARKING;  Surgeon: Gloriajean Large, MD;  Location: [facility] ENDOSCOPY;  Service: Pulmonary;;   FRACTURE SURGERY     right clavicle   HEMOSTASIS CONTROL  04/24/2022   Procedure: HEMOSTASIS CONTROL;  Surgeon: Gloriajean Large, MD;  Location: [facility] ENDOSCOPY;  Service: Pulmonary;;   INTERCOSTAL NERVE BLOCK Left 05/19/2022   Procedure: INTERCOSTAL NERVE BLOCK;  Surgeon: Zelphia Higashi, MD;  Location: [facility] OR;  Service: Thoracic;  Laterality: Left;   LAPAROTOMY N/A 11/16/2023   Procedure: LAPAROTOMY, EXPLORATORY; REDUCTION OF VOLVULUS;  Surgeon: Awilda Bogus, MD;   Location: [facility] ORS;  Service: General;  Laterality: N/A;   LYMPH NODE DISSECTION Left 05/19/2022   Procedure: LYMPH NODE DISSECTION;  Surgeon: Zelphia Higashi, MD;  Location: [facility] OR;  Service: Thoracic;  Laterality: Left;   PERIPHERAL VASCULAR INTERVENTION Bilateral 01/05/2022   Procedure: PERIPHERAL VASCULAR INTERVENTION;  Surgeon: Young Hensen, MD;  Location: [facility] INVASIVE CV LAB;  Service: Cardiovascular;  Laterality: Bilateral;  Bilateral Renal Artery Stents   RENAL ANGIOGRAPHY Bilateral 01/05/2022   Procedure: RENAL ARTERY STENTING;  Surgeon: Young Hensen, MD;  Location: [facility] INVASIVE CV LAB;  Service: Cardiovascular;  Laterality: Bilateral;   RENAL ANGIOGRAPHY N/A 01/18/2023   Procedure: RENAL ANGIOGRAPHY;  Surgeon: Young Hensen, MD;  Location: [facility] INVASIVE CV LAB;  Service: Cardiovascular;  Laterality: N/A;   RENAL INTERVENTION  01/18/2023   Procedure: RENAL INTERVENTION;  Surgeon: Young Hensen, MD;  Location: [facility] INVASIVE CV LAB;  Service: Cardiovascular;;   TONSILLECTOMY     VIDEO BRONCHOSCOPY WITH RADIAL ENDOBRONCHIAL ULTRASOUND  04/24/2022   Procedure: VIDEO BRONCHOSCOPY WITH RADIAL ENDOBRONCHIAL ULTRASOUND;  Surgeon: Gloriajean Large, MD;  Location: [facility] ENDOSCOPY;  Service: Pulmonary;;   VIDEO BRONCHOSCOPY WITH RADIAL ENDOBRONCHIAL ULTRASOUND  05/19/2022   Procedure: VIDEO BRONCHOSCOPY WITH RADIAL ENDOBRONCHIAL ULTRASOUND;  Surgeon: Arline Laity  M, MD;  Location: [facility] ENDOSCOPY;  Service: Pulmonary;;    Family History  Problem Relation Age of Onset   Lung cancer Brother        smoked   Cancer Other    Colon cancer Neg Hx    Esophageal cancer Neg Hx    Rectal cancer Neg Hx    Stomach cancer Neg Hx     Social History:  reports that he has been smoking cigarettes. He has a 61 pack-year smoking history. He has never used smokeless tobacco. He reports that he does not currently use alcohol. He reports that he does not use drugs.  Allergies: No Known  Allergies  Medications: I have reviewed the patient's current medications. Prior to Admission:  Medications Prior to Admission  Medication Sig Dispense Refill Last Dose/Taking   apixaban  (ELIQUIS ) 5 MG TABS tablet Take 5 mg by mouth 2 (two) times daily.   01/30/2024   aspirin  EC 81 MG tablet Take 81 mg by mouth daily. Swallow whole.      atorvastatin  (LIPITOR ) 40 MG tablet Take 40 mg by mouth every evening.       docusate sodium  (COLACE) 100 MG capsule Take 1 capsule (100 mg total) by mouth 2 (two) times daily. 30 capsule 0    dofetilide  (TIKOSYN ) 500 MCG capsule Take 1 capsule (500 mcg total) by mouth 2 (two) times daily. 180 capsule 2    enalapril  (VASOTEC ) 10 MG tablet Take 1 tablet (10 mg total) by mouth at bedtime. 90 tablet 3    ferrous sulfate  325 (65 FE) MG tablet Take 325 mg by mouth daily with breakfast. 250mg  per patient      Magnesium  250 MG TABS Take 250 mg by mouth every evening.      metoprolol  tartrate (LOPRESSOR ) 50 MG tablet Take 1.5 tablets (75 mg total) by mouth 2 (two) times daily. 270 tablet 3    Multiple Vitamin (MULTIVITAMIN) tablet Take 1 tablet by mouth daily. Centrum 50+      omeprazole (PRILOSEC) 20 MG capsule Take 20 mg by mouth daily.      ondansetron  (ZOFRAN ) 4 MG tablet Place 1 tablet (4 mg total) into feeding tube every 6 (six) hours as needed for nausea. (Patient not taking: Reported on 01/01/2024) 20 tablet 0    oxyCODONE  (OXY IR/ROXICODONE ) 5 MG immediate release tablet Take 1 tablet (5 mg total) by mouth every 4 (four) hours as needed for severe pain (pain score 7-10) or breakthrough pain. (Patient not taking: Reported on 01/01/2024) 15 tablet 0    polyethylene glycol (MIRALAX  / GLYCOLAX ) 17 g packet Take 17 g by mouth daily as needed for moderate constipation. 14 each 0    potassium chloride  SA (KLOR-CON  M20) 20 MEQ tablet Take 1 tablet (20 mEq total) by mouth daily. 90 tablet 2    tamsulosin  (FLOMAX ) 0.4 MG CAPS capsule Take 2 capsules (0.8 mg total) by mouth  daily after breakfast. 30 capsule 1    Tiotropium Bromide-Olodaterol (STIOLTO RESPIMAT ) 2.5-2.5 MCG/ACT AERS Inhale 2 puffs into the lungs daily. (Patient taking differently: Inhale 1 puff into the lungs 2 (two) times daily.) 4 g 11    vitamin C (ASCORBIC ACID) 250 MG tablet Take 250 mg by mouth daily.      Scheduled:  dofetilide   500 mcg Oral BID   metoprolol  tartrate  75 mg Oral BID   pantoprazole  (PROTONIX ) IV  40 mg Intravenous Q24H   Continuous:  dextrose  5 % and 0.9 % NaCl 100  mL/hr at 02/01/24 0057   prothrombin complex conc human (KCENTRA) IVPB 4,304 Units     PRN:acetaminophen  **OR** acetaminophen , morphine  injection, ondansetron  **OR** ondansetron  (ZOFRAN ) IV  Results for orders placed or performed during the hospital encounter of 01/31/24 (from the past 48 hours)  Lipase, blood     Status: None   Collection Time: 01/31/24  8:20 PM  Result Value Ref Range   Lipase 40 11 - 51 U/L    Comment: Performed at [facility], [address] [place], [place] [address]  Comprehensive metabolic panel     Status: Abnormal   Collection Time: 01/31/24  8:20 PM  Result Value Ref Range   Sodium 135 135 - 145 mmol/L   Potassium 3.8 3.5 - 5.1 mmol/L   Chloride 104 98 - 111 mmol/L   CO2 21 (L) 22 - 32 mmol/L   Glucose, Bld 141 (H) 70 - 99 mg/dL    Comment: Glucose reference range applies only to samples taken after fasting for at least 8 hours.   BUN 17 8 - 23 mg/dL   Creatinine, Ser 6.04 (H) 0.61 - 1.24 mg/dL   Calcium  9.0 8.9 - 10.3 mg/dL   Total Protein 7.8 6.5 - 8.1 g/dL   Albumin 4.2 3.5 - 5.0 g/dL   AST 45 (H) 15 - 41 U/L   ALT 80 (H) 0 - 44 U/L   Alkaline Phosphatase 91 38 - 126 U/L   Total Bilirubin 0.6 0.0 - 1.2 mg/dL   GFR, Estimated 52 (L) >60 mL/min    Comment: (NOTE) Calculated using the CKD-EPI Creatinine Equation (2021)    Anion gap 10 5 - 15    Comment: Performed at [facility], [address] [place], [place] [address]  CBC     Status: None   Collection Time:  01/31/24  8:20 PM  Result Value Ref Range   WBC 8.9 4.0 - 10.5 K/uL   RBC 4.54 4.22 - 5.81 MIL/uL   Hemoglobin 13.3 13.0 - 17.0 g/dL   HCT 11.9 14.7 - 82.9 %   MCV 93.2 80.0 - 100.0 fL   MCH 29.3 26.0 - 34.0 pg   MCHC 31.4 30.0 - 36.0 g/dL   RDW 56.2 13.0 - 86.5 %   Platelets 194 150 - 400 K/uL   nRBC 0.0 0.0 - 0.2 %    Comment: Performed at [facility], [address], [place], [place] [address]  Basic metabolic panel     Status: Abnormal   Collection Time: 02/01/24  4:39 AM  Result Value Ref Range   Sodium 139 135 - 145 mmol/L   Potassium 4.0 3.5 - 5.1 mmol/L   Chloride 111 98 - 111 mmol/L   CO2 16 (L) 22 - 32 mmol/L   Glucose, Bld 164 (H) 70 - 99 mg/dL    Comment: Glucose reference range applies only to samples taken after fasting for at least 8 hours.   BUN 24 (H) 8 - 23 mg/dL   Creatinine, Ser 6.29 (H) 0.61 - 1.24 mg/dL   Calcium  8.6 (L) 8.9 - 10.3 mg/dL   GFR, Estimated 41 (L) >60 mL/min    Comment: (NOTE) Calculated using the CKD-EPI Creatinine Equation (2021)    Anion gap 12 5 - 15    Comment: Performed at [facility], [address], [place], [place] [address]  CBC     Status: None   Collection Time: 02/01/24  4:39 AM  Result Value Ref Range   WBC 7.1 4.0 -  10.5 K/uL   RBC 5.35 4.22 - 5.81 MIL/uL   Hemoglobin 15.7 13.0 - 17.0 g/dL   HCT 16.1 09.6 - 04.5 %   MCV 95.1 80.0 - 100.0 fL   MCH 29.3 26.0 - 34.0 pg   MCHC 30.8 30.0 - 36.0 g/dL   RDW 40.9 81.1 - 91.4 %   Platelets 229 150 - 400 K/uL   nRBC 0.0 0.0 - 0.2 %    Comment: Performed at [facility], [address], [place], [place] [address]  Magnesium      Status: None   Collection Time: 02/01/24  4:39 AM  Result Value Ref Range   Magnesium  2.0 1.7 - 2.4 mg/dL    Comment: Performed at [facility], [address] [place], [place] [address]  Urinalysis, Routine w reflex microscopic -Urine, Clean Catch     Status: Abnormal   Collection Time: 02/01/24  6:10 AM  Result Value Ref Range   Color, Urine YELLOW  YELLOW   APPearance HAZY (A) CLEAR   Specific Gravity, Urine >1.046 (H) 1.005 - 1.030   pH 6.0 5.0 - 8.0   Glucose, UA NEGATIVE NEGATIVE mg/dL   Hgb urine dipstick NEGATIVE NEGATIVE   Bilirubin Urine NEGATIVE NEGATIVE   Ketones, ur NEGATIVE NEGATIVE mg/dL   Protein, ur 30 (A) NEGATIVE mg/dL   Nitrite POSITIVE (A) NEGATIVE   Leukocytes,Ua MODERATE (A) NEGATIVE   RBC / HPF 21-50 0 - 5 RBC/hpf   WBC, UA >50 0 - 5 WBC/hpf   Bacteria, UA RARE (A) NONE SEEN   Squamous Epithelial / HPF 0-5 0 - 5 /HPF   Mucus PRESENT    Budding Yeast PRESENT     Comment: Performed at [facility], [address], [place], [place] [address]    DG Abd Portable 1 View Result Date: 01/31/2024 CLINICAL DATA:  NG tube placement EXAM: PORTABLE ABDOMEN - 1 VIEW COMPARISON:  CT 01/31/2024 FINDINGS: Enteric tube tip and side port overlie the proximal stomach. Dilated air-filled small bowel corresponding to history of bowel obstruction IMPRESSION: Enteric tube tip and side port overlie the proximal stomach. Electronically Signed   By: Esmeralda Hedge M.D.   On: 01/31/2024 23:29   CT ABDOMEN PELVIS W CONTRAST Result Date: 01/31/2024 CLINICAL DATA:  Bowel obstruction EXAM: CT ABDOMEN AND PELVIS WITH CONTRAST TECHNIQUE: Multidetector CT imaging of the abdomen and pelvis was performed using the standard protocol following bolus administration of intravenous contrast. RADIATION DOSE REDUCTION: This exam was performed according to the departmental dose-optimization program which includes automated exposure control, adjustment of the mA and/or kV according to patient size and/or use of iterative reconstruction technique. CONTRAST:  OMNIPAQUE  IOHEXOL  300 MG/ML  SOLN COMPARISON:  None Available. FINDINGS: Lower chest: No acute abnormality. Extensive right coronary artery calcification. Calcified pleural plaques are seen at the lung bases bilaterally, nonspecific. Hepatobiliary: Simple cysts scattered throughout the liver. Liver  otherwise unremarkable. Gallbladder unremarkable. No intra or extrahepatic biliary ductal dilation. Pancreas: Unremarkable Spleen: Normal in size without focal abnormality. 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 Right adrenal gland is unremarkable. Layering calcification is seen within a simple cyst within the left kidney. Simple cortical cyst is in lower the right kidney. Mild asymmetric left renal cortical atrophy. The kidneys are otherwise unremarkable. Bladder. 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 The proximal small bowel is dilated and fluid-filled and the distal terminal ileum and colon are decompressed in keeping with a complete small obstruction. No frankly ischemic segments of bowel. Mild ascites. No free intraperitoneal gas. The stomach small bowel, large otherwise unremarkable. The appendix is absent. Vascular/Lymphatic: Extensive aortoiliac atherosclerotic calcification. Left renal artery stenting has been performed. 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 No pathologic adenopathy within the abdomen and pelvis. Reproductive: Prostate is unremarkable. Other: Small bilateral fat containing inguinal hernias. Musculoskeletal: No acute bone abnormality. No lytic or blastic bone lesion. Osseous structures are age appropriate. IMPRESSION: 1. 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 No frankly ischemic segments of bowel. Mild ascites. No free intraperitoneal gas. 2. Extensive coronary artery calcification. 3. 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 4. 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 Correlation with serum laboratory examination, including serum metanephrines, may be helpful to exclude alternative considerations. 5. 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 Aortic Atherosclerosis (ICD10-I70.0). Electronically Signed   By: Worthy Heads M.D.   On: 01/31/2024 22:13    ROS:  Pertinent items are noted in HPI.  Blood pressure (!) 156/88, pulse (!) 121, temperature 98 F (36.7 C), temperature source Oral, resp. rate 20, height 6\' 1"  (1.854 m), weight 94.8 kg, SpO2 98%.  Physical Exam Constitutional:      Appearance: He is not ill-appearing, toxic-appearing or diaphoretic.     Comments: Looks uncomfortable due to pain and NG placed  Cardiovascular:     Rate and Rhythm: Regular rhythm. Tachycardia present.  Pulmonary:     Effort: Pulmonary effort is normal. No respiratory distress.     Breath sounds: Normal breath sounds.  Abdominal:     General: A surgical scar is present. Bowel sounds are increased. There is distension.     Palpations: Abdomen is soft. There is no shifting dullness.     Tenderness: There is generalized abdominal tenderness.  Skin:    General: Skin is warm and dry.     Coloration: Skin is not jaundiced.  Neurological:     Mental Status: He is alert.  Psychiatric:        Mood and Affect: Mood normal.        Behavior: Behavior normal.        Thought Content: Thought content normal.      Assessment/Plan:  The patient is a 78 year old male with a medical history notable for atrial fibrillation, alcohol abuse, COPD, GERD, a history of CVA, hypertension, and hyperlipidemia. 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 Due to the imaging results, symptoms, and similar episode on 11/14/2023,  proceed with operative management even though the patient last Eliquis  and Aspirin  dose was 36 and 24 hours ago respectively.   Samuel Ferrell 02/01/2024, 8:32 AM

## 2024-02-01 NOTE — Transfer of Care (Signed)
 Immediate Anesthesia Transfer of Care Note  Patient: Samuel Ferrell  Procedure(s) Performed: LAPAROTOMY, EXPLORATORY; SMALL BOWEL RESECTION; TICK REMOVAL. (Abdomen)  Patient Location: PACU  Anesthesia Type:General  Level of Consciousness: awake, alert , oriented, and patient cooperative  Airway & Oxygen Therapy: Patient Spontanous Breathing and Patient connected to face mask oxygen  Post-op Assessment: Report given to RN, Post -op Vital signs reviewed and stable, and Patient moving all extremities X 4  Post vital signs: Reviewed and stable  Last Vitals:  Vitals Value Taken Time  BP 142/69 02/01/24 1300  Temp    Pulse 112 02/01/24 1301  Resp 26 02/01/24 1301  SpO2 98 % 02/01/24 1301  Vitals shown include unfiled device data.  Last Pain:  Vitals:   02/01/24 1159  TempSrc: Oral  PainSc:          Complications: No notable events documented.

## 2024-02-01 NOTE — Assessment & Plan Note (Signed)
 Hold statin therapy for now.

## 2024-02-01 NOTE — Anesthesia Procedure Notes (Signed)
 Procedure Name: Intubation Date/Time: 02/01/2024 11:31 AM  Performed by: Verline Glow, CRNAPre-anesthesia Checklist: Patient identified, Patient being monitored, Timeout performed, Emergency Drugs available and Suction available Patient Re-evaluated:Patient Re-evaluated prior to induction Oxygen Delivery Method: Circle system utilized Preoxygenation: Pre-oxygenation with 100% oxygen Induction Type: IV induction, Rapid sequence and Cricoid Pressure applied Laryngoscope Size: Mac and 3 Grade View: Grade I Tube type: Oral Tube size: 7.5 mm Number of attempts: 1 Airway Equipment and Method: Stylet Placement Confirmation: ETT inserted through vocal cords under direct vision, positive ETCO2 and breath sounds checked- equal and bilateral Secured at: 22 cm Tube secured with: Tape Dental Injury: Teeth and Oropharynx as per pre-operative assessment

## 2024-02-01 NOTE — Assessment & Plan Note (Signed)
 Recurrent volvulus  Plan for surgical intervention per surgery.  Continue supportive medical therapy with IV fluids, as needed analgesics and antiemetics Will add IV antiacid therapy with pantoprazole  for GI prophylaxis.  Continue NG tube to low intermittent suction.

## 2024-02-02 ENCOUNTER — Inpatient Hospital Stay (HOSPITAL_COMMUNITY)

## 2024-02-02 ENCOUNTER — Encounter (HOSPITAL_COMMUNITY): Payer: Self-pay | Admitting: General Surgery

## 2024-02-02 DIAGNOSIS — K56609 Unspecified intestinal obstruction, unspecified as to partial versus complete obstruction: Secondary | ICD-10-CM | POA: Diagnosis not present

## 2024-02-02 DIAGNOSIS — I10 Essential (primary) hypertension: Secondary | ICD-10-CM | POA: Diagnosis not present

## 2024-02-02 DIAGNOSIS — F1010 Alcohol abuse, uncomplicated: Secondary | ICD-10-CM | POA: Diagnosis not present

## 2024-02-02 DIAGNOSIS — R338 Other retention of urine: Secondary | ICD-10-CM | POA: Diagnosis not present

## 2024-02-02 DIAGNOSIS — N179 Acute kidney failure, unspecified: Secondary | ICD-10-CM | POA: Diagnosis not present

## 2024-02-02 LAB — COMPREHENSIVE METABOLIC PANEL WITH GFR
ALT: 42 U/L (ref 0–44)
AST: 24 U/L (ref 15–41)
Albumin: 2.7 g/dL — ABNORMAL LOW (ref 3.5–5.0)
Alkaline Phosphatase: 50 U/L (ref 38–126)
Anion gap: 12 (ref 5–15)
BUN: 54 mg/dL — ABNORMAL HIGH (ref 8–23)
CO2: 17 mmol/L — ABNORMAL LOW (ref 22–32)
Calcium: 7.1 mg/dL — ABNORMAL LOW (ref 8.9–10.3)
Chloride: 111 mmol/L (ref 98–111)
Creatinine, Ser: 3.76 mg/dL — ABNORMAL HIGH (ref 0.61–1.24)
GFR, Estimated: 16 mL/min — ABNORMAL LOW (ref >60)
Glucose, Bld: 157 mg/dL — ABNORMAL HIGH (ref 70–99)
Potassium: 4.3 mmol/L (ref 3.5–5.1)
Sodium: 140 mmol/L (ref 135–145)
Total Bilirubin: 0.6 mg/dL (ref 0.0–1.2)
Total Protein: 6 g/dL — ABNORMAL LOW (ref 6.5–8.1)

## 2024-02-02 LAB — CBC WITH DIFFERENTIAL/PLATELET
Abs Immature Granulocytes: 0.04 10*3/uL (ref 0.00–0.07)
Basophils Absolute: 0.1 10*3/uL (ref 0.0–0.1)
Basophils Relative: 1 %
Eosinophils Absolute: 0 10*3/uL (ref 0.0–0.5)
Eosinophils Relative: 0 %
HCT: 41.7 % (ref 39.0–52.0)
Hemoglobin: 13.4 g/dL (ref 13.0–17.0)
Immature Granulocytes: 1 %
Lymphocytes Relative: 10 %
Lymphs Abs: 0.6 10*3/uL — ABNORMAL LOW (ref 0.7–4.0)
MCH: 30 pg (ref 26.0–34.0)
MCHC: 32.1 g/dL (ref 30.0–36.0)
MCV: 93.5 fL (ref 80.0–100.0)
Monocytes Absolute: 0.7 10*3/uL (ref 0.1–1.0)
Monocytes Relative: 11 %
Neutro Abs: 4.9 10*3/uL (ref 1.7–7.7)
Neutrophils Relative %: 77 %
Platelets: 188 10*3/uL (ref 150–400)
RBC: 4.46 MIL/uL (ref 4.22–5.81)
RDW: 14.7 % (ref 11.5–15.5)
WBC: 6.3 10*3/uL (ref 4.0–10.5)
nRBC: 0 % (ref 0.0–0.2)

## 2024-02-02 LAB — MAGNESIUM: Magnesium: 1.8 mg/dL (ref 1.7–2.4)

## 2024-02-02 MED ORDER — DEXTROSE-SODIUM CHLORIDE 5-0.9 % IV SOLN: INTRAVENOUS | Status: DC

## 2024-02-02 MED ORDER — CHLORHEXIDINE GLUCONATE CLOTH 2 % EX PADS
6.0000 | MEDICATED_PAD | Freq: Every day | CUTANEOUS | Status: DC
  Administered 2024-02-02 – 2024-02-05 (×4): 6 via TOPICAL

## 2024-02-02 MED ORDER — MAGNESIUM SULFATE 2 GM/50ML IV SOLN
2.0000 g | Freq: Once | INTRAVENOUS | Status: AC
  Administered 2024-02-02: 2 g via INTRAVENOUS
  Filled 2024-02-02: qty 50

## 2024-02-02 MED ORDER — SODIUM CHLORIDE 0.9 % IV SOLN
2.0000 g | INTRAVENOUS | Status: AC
  Administered 2024-02-02 – 2024-02-04 (×3): 2 g via INTRAVENOUS
  Filled 2024-02-02 (×3): qty 20

## 2024-02-02 MED ORDER — FENTANYL CITRATE PF 50 MCG/ML IJ SOSY
25.0000 ug | PREFILLED_SYRINGE | INTRAMUSCULAR | Status: DC | PRN
  Administered 2024-02-02 – 2024-02-05 (×13): 25 ug via INTRAVENOUS
  Filled 2024-02-02 (×13): qty 1

## 2024-02-02 MED ORDER — SODIUM CHLORIDE 0.9 % IV SOLN
100.0000 mg | Freq: Two times a day (BID) | INTRAVENOUS | Status: AC
  Administered 2024-02-02 – 2024-02-04 (×6): 100 mg via INTRAVENOUS
  Filled 2024-02-02 (×6): qty 100

## 2024-02-02 MED ORDER — BUDESONIDE 0.25 MG/2ML IN SUSP
0.2500 mg | Freq: Two times a day (BID) | RESPIRATORY_TRACT | Status: DC
  Administered 2024-02-02 – 2024-02-05 (×6): 0.25 mg via RESPIRATORY_TRACT
  Filled 2024-02-02 (×6): qty 2

## 2024-02-02 NOTE — Plan of Care (Signed)

## 2024-02-02 NOTE — Progress Notes (Signed)
 PROGRESS NOTE   Samuel Ferrell  ZOX:[id number] DOB: 1945-11-08 DOA: 01/31/2024 PCP: Tisovec, Richard W, MD   Chief Complaint  Patient presents with   Abdominal Pain   Nausea   Level of care: Telemetry  Brief Admission History:  78 y.o. male with medical history significant of atrial fibrillation, alcohol abuse, COPD, GERD, history of CVA, hypertension, and hyperlipidemia who presented with abdominal pain, and nausea.  Recent hospitalization 03/05 to 11/21/23 for small bowel obstruction due to volvulus, he required surgical intervention with improvement of his symptoms.    Patient has been doing well, at his usual state of health until today around 3 pm when he developed acute onset of abdominal pain. It has sharp in nature,10/10 in intensity and associated with nausea but not vomiting. He noted abdominal distention, diaphoresis and chills.  Because of severity and persistence of his symptoms he presented to the ED and determined to have recurrent volvulus on CT images.     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  Continue NG tube to low intermittent suction.    Mixed hyperlipidemia Hold statin therapy for now  Essential hypertension Continue blood pressure monitoring, for now continue to hold on antihypertensive medications  Hold enalapril .   AKI (acute kidney injury)  Postobstructive given acute urinary retention Hopefully renal function will improved after foley cath placement  Acute urinary retention  - foley cath placed 02/02/24 - add tamsulosin   0.4 mg daily with supper    Paroxysmal atrial fibrillation  Hold anticoagulation for surgery   Resumed home oral dofetilide  and metoprolol    Continue telemetry monitoring. Keep K at 4 and Mg at 2.   Alcohol abuse, daily use No signs of acute withdrawal CIWA protocol   DVT prophylaxis: SCDs Code Status: Full  Family Communication:  Disposition: TBD    Consultants:   Procedures:   Antimicrobials:    Subjective: Pt having difficulty voiding on his own.     Objective: Vitals:   02/01/24 2222 02/02/24 0414 02/02/24 0753 02/02/24 0805  BP:  116/62    Pulse:  (!) 118    Resp:  20    Temp:  98 F (36.7 C)    TempSrc:  Oral    SpO2: 95% 96% 95% 95%  Weight:      Height:        Intake/Output Summary (Last 24 hours) at 02/02/2024 1337 Last data filed at 02/02/2024 0340 Gross per 24 hour  Intake 322.09 ml  Output 1950 ml  Net -1627.91 ml   Filed Weights   01/31/24 2010  Weight: 94.8 kg   Examination:  General exam: Appears calm and comfortable  Respiratory system: Clear to auscultation. Respiratory effort normal. Cardiovascular system: normal S1 & S2 heard. No JVD, murmurs, rubs, gallops or clicks. No pedal edema. Gastrointestinal system: Abdomen is soft and with mild tenderness. No organomegaly or masses felt. hypoactive bowel sounds heard. Central nervous system: Alert and oriented. No focal neurological deficits. Extremities: Symmetric 5 x 5 power. Skin: No rashes, lesions or ulcers. Psychiatry: Judgement and insight appear normal. Mood & affect appropriate.   Data  Reviewed: I have personally reviewed following labs and imaging studies  CBC: Recent Labs  Lab 01/31/24 2020 02/01/24 0439 02/02/24 0410  WBC 8.9 7.1 6.3  NEUTROABS  --   --  4.9  HGB 13.3 15.7 13.4  HCT 42.3 50.9 41.7  MCV 93.2 95.1 93.5  PLT 194 229 188    Basic Metabolic Panel: Recent Labs  Lab 01/31/24 2020 02/01/24 0439 02/02/24 0410  NA 135 139 140  K 3.8 4.0 4.3  CL 104 111  111  CO2 21* 16* 17*  GLUCOSE 141* 164* 157*  BUN 17 24* 54*  CREATININE 1.41* 1.71* 3.76*  CALCIUM  9.0 8.6* 7.1*  MG  --  2.0 1.8    CBG: No results for input(s): "GLUCAP" in the last 168 hours.  No results found for this or any previous visit (from the past 240 hours).   Radiology Studies: DG Chest 1 View Result Date: 02/02/2024 CLINICAL DATA:  [id number] Dyspnea [id number] EXAM: CHEST  1 VIEW COMPARISON:  11/16/2023 FINDINGS: Gastric tube extends at least as far as the stomach, tip not seen. Mild linear opacities in the left lower lung. Right lung clear. Heart size and mediastinal contours are within normal limits. Aortic Atherosclerosis (ICD10-170.0). Chronic blunting of left lateral costophrenic angle. Visualized bones unremarkable. IMPRESSION: Mild left lower lung atelectasis. Electronically Signed   By: Nicoletta Barrier M.D.   On: 02/02/2024 09:51   DG Abd Portable 1 View Result Date: 01/31/2024 CLINICAL DATA:  NG tube placement EXAM: PORTABLE ABDOMEN - 1 VIEW COMPARISON:  CT 01/31/2024 FINDINGS: Enteric tube tip and side port overlie the proximal stomach. Dilated air-filled small bowel corresponding to history of bowel obstruction IMPRESSION: Enteric tube tip and side port overlie the proximal stomach. Electronically Signed   By: Esmeralda Hedge M.D.   On: 01/31/2024 23:29   CT ABDOMEN PELVIS W CONTRAST Result Date: 01/31/2024 CLINICAL DATA:  Bowel obstruction EXAM: CT ABDOMEN AND PELVIS WITH CONTRAST TECHNIQUE: Multidetector CT imaging of the abdomen and pelvis was performed using the standard protocol following bolus administration of intravenous contrast. RADIATION DOSE REDUCTION: This exam was performed according to the departmental dose-optimization program which includes automated exposure control, adjustment of the mA and/or kV according to patient size and/or use of iterative reconstruction technique. CONTRAST:  OMNIPAQUE  IOHEXOL  300 MG/ML  SOLN COMPARISON:  None Available. FINDINGS:  Lower chest: No acute abnormality. Extensive right coronary artery calcification. Calcified pleural plaques are seen at the lung bases bilaterally, nonspecific. Hepatobiliary: Simple cysts scattered throughout the liver. Liver otherwise unremarkable. Gallbladder unremarkable. No intra or extrahepatic biliary ductal dilation. Pancreas: Unremarkable Spleen: Normal in size without focal abnormality. 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 Right adrenal gland is unremarkable. Layering calcification is seen within a simple cyst within the left kidney. Simple cortical cyst is in lower the right kidney. Mild asymmetric left renal cortical atrophy. The kidneys are otherwise unremarkable. Bladder. Stomach/Bowel: There is a distal small-bowel obstruction secondary to mesenteric volvulus with the small bowel mesentery rotated 2 complete resolutions within right lower quadrant. The proximal small bowel is dilated and fluid-filled and the distal terminal ileum and colon are decompressed in keeping with a complete small obstruction. No frankly ischemic segments of bowel. Mild ascites. No free intraperitoneal gas. The stomach small bowel, large otherwise unremarkable. The appendix is absent. Vascular/Lymphatic: Extensive aortoiliac atherosclerotic calcification.  Left renal artery stenting has been performed. 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 No pathologic adenopathy within the abdomen and pelvis. Reproductive: Prostate is unremarkable. Other: Small bilateral fat containing inguinal hernias. Musculoskeletal: No acute bone abnormality. No lytic or blastic bone lesion. Osseous structures are age appropriate. IMPRESSION: 1. 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 No  frankly ischemic segments of bowel. Mild ascites. No free intraperitoneal gas. 2. Extensive coronary artery calcification. 3. 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 4. 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 Correlation with serum laboratory examination, including serum metanephrines, may be helpful to exclude alternative considerations. 5. 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 Aortic Atherosclerosis (ICD10-I70.0). Electronically Signed   By: Worthy Heads M.D.   On: 01/31/2024 22:13    Scheduled Meds:  arformoterol   15 mcg Nebulization BID   And   umeclidinium bromide   1 puff Inhalation Daily   budesonide (PULMICORT) nebulizer solution  0.25 mg Nebulization BID   Chlorhexidine  Gluconate Cloth  6 each Topical Daily   dofetilide   500 mcg Oral BID   folic acid  1 mg Oral Daily   metoprolol  tartrate  75 mg Oral BID   multivitamin with minerals  1 tablet Oral Daily   pantoprazole  (PROTONIX ) IV  40 mg Intravenous Q24H   thiamine   100 mg Oral Daily   Or   thiamine   100 mg Intravenous Daily   Continuous Infusions:  cefTRIAXone (ROCEPHIN)  IV 2 g (02/02/24 0821)   dextrose  5 % and 0.9 % NaCl 100 mL/hr at 02/02/24 1055   doxycycline (VIBRAMYCIN) IV 100 mg (02/02/24 0923)   magnesium  sulfate bolus IVPB 2 g (02/02/24 1318)     LOS: 2 days   Time spent: 55 mins  Terell Kincy Lincoln Renshaw, MD How to contact the [facility] Attending or Consulting provider 7A - 7P or covering provider during after hours 7P -7A, for this patient?  Check the care team in [facility] and look for a) attending/consulting TRH provider listed and b) the TRH team listed Log into [email or web address] to find provider on call.  Locate the TRH provider you are looking for under [facility] and page to a number  that you can be directly reached. If you still have difficulty reaching the provider, please page the [facility] (Director on Call) for the Hospitalists listed on amion for assistance.  02/02/2024, 1:37 PM

## 2024-02-02 NOTE — Progress Notes (Signed)
 [facility]  No flatus or BM. AKI with UA with nitrites on admission. Had no foley in the OR. Is retaining now, likely has UTI again.  Agree with replacing foley.  BP 116/62 (BP Location: Right Arm)   Pulse (!) 118   Temp 98 F (36.7 C) (Oral)   Resp 20   Ht 6\' 1"  (1.854 m)   Wt 94.8 kg   SpO2 95%   BMI 27.57 kg/m  Soft, distended, appropriately tender NG in place Honeycomb with minimal staining   Patient s/p Ex lap and SBR for recurrent SB volvulus. Doing well but has AKI likely from retention.  NPO, NG Can have ice Ambulate Foley being placed Monitor labs   Deena Farrier, MD [facility] [address] [place], [place] [address] [phone] (office)

## 2024-02-03 DIAGNOSIS — F1010 Alcohol abuse, uncomplicated: Secondary | ICD-10-CM | POA: Diagnosis not present

## 2024-02-03 DIAGNOSIS — N179 Acute kidney failure, unspecified: Secondary | ICD-10-CM | POA: Diagnosis not present

## 2024-02-03 DIAGNOSIS — I10 Essential (primary) hypertension: Secondary | ICD-10-CM | POA: Diagnosis not present

## 2024-02-03 DIAGNOSIS — K56609 Unspecified intestinal obstruction, unspecified as to partial versus complete obstruction: Secondary | ICD-10-CM | POA: Diagnosis not present

## 2024-02-03 LAB — BASIC METABOLIC PANEL WITH GFR
Anion gap: 3 — ABNORMAL LOW (ref 5–15)
BUN: 50 mg/dL — ABNORMAL HIGH (ref 8–23)
CO2: 21 mmol/L — ABNORMAL LOW (ref 22–32)
Calcium: 7.6 mg/dL — ABNORMAL LOW (ref 8.9–10.3)
Chloride: 114 mmol/L — ABNORMAL HIGH (ref 98–111)
Creatinine, Ser: 2.13 mg/dL — ABNORMAL HIGH (ref 0.61–1.24)
GFR, Estimated: 31 mL/min — ABNORMAL LOW (ref >60)
Glucose, Bld: 119 mg/dL — ABNORMAL HIGH (ref 70–99)
Potassium: 3.9 mmol/L (ref 3.5–5.1)
Sodium: 138 mmol/L (ref 135–145)

## 2024-02-03 LAB — CBC WITH DIFFERENTIAL/PLATELET
Abs Immature Granulocytes: 0.04 10*3/uL (ref 0.00–0.07)
Basophils Absolute: 0 10*3/uL (ref 0.0–0.1)
Basophils Relative: 1 %
Eosinophils Absolute: 0 10*3/uL (ref 0.0–0.5)
Eosinophils Relative: 0 %
HCT: 36.7 % — ABNORMAL LOW (ref 39.0–52.0)
Hemoglobin: 12 g/dL — ABNORMAL LOW (ref 13.0–17.0)
Immature Granulocytes: 1 %
Lymphocytes Relative: 9 %
Lymphs Abs: 0.6 10*3/uL — ABNORMAL LOW (ref 0.7–4.0)
MCH: 30 pg (ref 26.0–34.0)
MCHC: 32.7 g/dL (ref 30.0–36.0)
MCV: 91.8 fL (ref 80.0–100.0)
Monocytes Absolute: 0.6 10*3/uL (ref 0.1–1.0)
Monocytes Relative: 9 %
Neutro Abs: 5.6 10*3/uL (ref 1.7–7.7)
Neutrophils Relative %: 80 %
Platelets: 160 10*3/uL (ref 150–400)
RBC: 4 MIL/uL — ABNORMAL LOW (ref 4.22–5.81)
RDW: 14.7 % (ref 11.5–15.5)
WBC: 6.9 10*3/uL (ref 4.0–10.5)
nRBC: 0 % (ref 0.0–0.2)

## 2024-02-03 LAB — PROCALCITONIN: Procalcitonin: 36.96 ng/mL

## 2024-02-03 LAB — MAGNESIUM: Magnesium: 2.4 mg/dL (ref 1.7–2.4)

## 2024-02-03 MED ORDER — GABAPENTIN 100 MG PO CAPS
100.0000 mg | ORAL_CAPSULE | Freq: Once | ORAL | Status: AC
  Administered 2024-02-03: 100 mg via ORAL
  Filled 2024-02-03: qty 1

## 2024-02-03 MED ORDER — ATORVASTATIN CALCIUM 40 MG PO TABS: 40.0000 mg | ORAL_TABLET | Freq: Every evening | ORAL | Status: DC

## 2024-02-03 MED ORDER — DOFETILIDE 125 MCG PO CAPS
125.0000 ug | ORAL_CAPSULE | Freq: Two times a day (BID) | ORAL | Status: DC
Start: 2024-02-03 — End: 2024-02-04
  Administered 2024-02-03 – 2024-02-04 (×3): 125 ug via ORAL
  Filled 2024-02-03 (×5): qty 1

## 2024-02-03 MED ORDER — CHLORPROMAZINE HCL 25 MG PO TABS
25.0000 mg | ORAL_TABLET | Freq: Three times a day (TID) | ORAL | Status: DC | PRN
  Administered 2024-02-03 – 2024-02-05 (×2): 25 mg via ORAL
  Filled 2024-02-03 (×3): qty 1

## 2024-02-03 MED ORDER — ATORVASTATIN CALCIUM 40 MG PO TABS
40.0000 mg | ORAL_TABLET | Freq: Every evening | ORAL | Status: DC
  Administered 2024-02-04: 40 mg via ORAL
  Filled 2024-02-03 (×2): qty 1

## 2024-02-03 MED ORDER — TAMSULOSIN HCL 0.4 MG PO CAPS
0.4000 mg | ORAL_CAPSULE | Freq: Every day | ORAL | Status: DC
  Administered 2024-02-03 – 2024-02-05 (×3): 0.4 mg via ORAL
  Filled 2024-02-03 (×3): qty 1

## 2024-02-03 MED ORDER — DEXTROSE-SODIUM CHLORIDE 5-0.9 % IV SOLN: INTRAVENOUS | Status: AC

## 2024-02-03 NOTE — Progress Notes (Signed)
 PROGRESS NOTE   Samuel Ferrell  ZOX:[id number] DOB: 05-Feb-1946 DOA: 01/31/2024 PCP: Tisovec, Richard W, MD   Chief Complaint  Patient presents with   Abdominal Pain   Nausea   Level of care: Telemetry  Brief Admission History:  78 y.o. male with medical history significant of atrial fibrillation, alcohol abuse, COPD, GERD, history of CVA, hypertension, and hyperlipidemia who presented with abdominal pain, and nausea.  Recent hospitalization 03/05 to 11/21/23 for small bowel obstruction due to volvulus, he required surgical intervention with improvement of his symptoms.    Patient has been doing well, at his usual state of health until today around 3 pm when he developed acute onset of abdominal pain. It has sharp in nature,10/10 in intensity and associated with nausea but not vomiting. He noted abdominal distention, diaphoresis and chills.  Because of severity and persistence of his symptoms he presented to the ED and determined to have recurrent volvulus on CT images.     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  Continue NG tube to low intermittent suction.    Mixed hyperlipidemia Hold statin therapy for now  Essential hypertension Continue blood pressure monitoring, for now continue to hold on antihypertensive medications  Hold enalapril .   AKI (acute kidney injury)  Postobstructive given acute urinary retention Hopefully renal function will improved after foley cath placement  Acute urinary retention  - foley cath placed 02/02/24 - added  tamsulosin  0.4 mg daily  - hopefully can remove foley in next 1-2 days   UTI  - completing IV ceftriaxone on 5/26  Question of Left pneumonia - completing 3 days of IV antibiotics on 5/26   Paroxysmal atrial fibrillation  Hold anticoagulation for surgery   Resumed home oral dofetilide  and metoprolol    Continue telemetry monitoring. Keep K at 4 and Mg at 2.   Alcohol abuse, daily use No signs of acute withdrawal CIWA protocol   DVT prophylaxis: SCDs Code Status: Full  Family Communication:  Disposition: TBD    Consultants:   Procedures:   Antimicrobials:    Subjective: Pt says overall he feels better with the catheter draining his urine.  He wants to try to ambulate a bit today.      Objective: Vitals:   02/03/24 0446 02/03/24 0840 02/03/24 1012 02/03/24 1408  BP: 138/65  136/66 133/64  Pulse: 86  91 81  Resp: 18  18 17   Temp: 97.6 F (36.4 C)  97.8 F (36.6 C) 98 F (36.7 C)  TempSrc: Oral  Oral   SpO2: 97% 98% 98% 98%  Weight:      Height:        Intake/Output Summary (Last 24 hours) at 02/03/2024 1517 Last data filed at 02/03/2024 1044 Gross per 24 hour  Intake 250 ml  Output 2750 ml  Net -2500 ml   Filed Weights   01/31/24 2010  Weight: 94.8 kg   Examination:  General exam: Appears calm and comfortable  Respiratory system: Clear to auscultation. Respiratory effort normal. Cardiovascular system: normal S1 & S2 heard. No JVD, murmurs, rubs, gallops or clicks. No pedal edema. Gastrointestinal  system: Abdomen is soft and with appropriate tenderness. No organomegaly or masses felt. hypoactive bowel sounds.  Central nervous system: Alert and oriented. No focal neurological deficits. Extremities: Symmetric 5 x 5 power. Skin: No rashes, lesions or ulcers. Psychiatry: Judgement and insight appear normal. Mood & affect appropriate.   Data Reviewed: I have personally reviewed following labs and imaging studies  CBC: Recent Labs  Lab 01/31/24 2020  02/01/24 0439 02/02/24 0410 02/03/24 0250  WBC 8.9 7.1 6.3 6.9  NEUTROABS  --   --  4.9 5.6  HGB 13.3 15.7 13.4 12.0*  HCT 42.3 50.9 41.7 36.7*  MCV 93.2 95.1 93.5 91.8  PLT 194 229 188 160    Basic Metabolic Panel: Recent Labs  Lab 01/31/24 2020 02/01/24 0439 02/02/24 0410 02/03/24 0250  NA 135 139 140 138  K 3.8 4.0 4.3 3.9  CL 104 111 111 114*  CO2 21* 16* 17* 21*  GLUCOSE 141* 164* 157* 119*  BUN 17 24* 54* 50*  CREATININE 1.41* 1.71* 3.76* 2.13*  CALCIUM  9.0 8.6* 7.1* 7.6*  MG  --  2.0 1.8 2.4    CBG: No results for input(s): "GLUCAP" in the last 168 hours.  No results found for this or any previous visit (from the past 240 hours).   Radiology Studies: DG Chest 1 View Result Date: 02/02/2024 CLINICAL DATA:  [id number] Dyspnea [id number] EXAM: CHEST  1 VIEW COMPARISON:  11/16/2023 FINDINGS: Gastric tube extends at least as far as the stomach, tip not seen. Mild linear opacities in the left lower lung. Right lung clear. Heart size and mediastinal contours are within normal limits. Aortic Atherosclerosis (ICD10-170.0). Chronic blunting of left lateral costophrenic angle. Visualized bones unremarkable. IMPRESSION: Mild left lower lung atelectasis. Electronically Signed   By: Nicoletta Barrier M.D.   On: 02/02/2024 09:51    Scheduled Meds:  arformoterol   15 mcg Nebulization BID   And   umeclidinium bromide   1 puff Inhalation Daily   budesonide (PULMICORT) nebulizer solution  0.25 mg Nebulization BID   Chlorhexidine  Gluconate Cloth  6 each Topical Daily   dofetilide   125 mcg Oral BID   folic acid  1 mg Oral Daily   metoprolol  tartrate  75 mg Oral BID   multivitamin with minerals  1 tablet Oral Daily   pantoprazole  (PROTONIX ) IV  40 mg Intravenous Q24H   tamsulosin   0.4 mg Oral QPC breakfast   thiamine   100 mg Oral Daily   Or   thiamine   100 mg Intravenous Daily   Continuous Infusions:  cefTRIAXone (ROCEPHIN)  IV 2 g (02/03/24 1155)   dextrose  5 % and 0.9 % NaCl 100 mL/hr at  02/03/24 1005   doxycycline (VIBRAMYCIN) IV 100 mg (02/03/24 1004)     LOS: 3 days   Time spent: 55 mins  Enisa Runyan Lincoln Renshaw, MD How to contact the [facility] Attending or Consulting provider 7A - 7P or covering provider during after hours 7P -7A, for this patient?  Check the care team in [facility] and look for a) attending/consulting TRH provider listed and b) the TRH team listed Log into [email or web address] to find provider on call.  Locate the TRH provider you are looking for under [facility] and page to a number that you can be directly reached. If you still have difficulty reaching the provider, please page the [facility] (Director on Call) for the Hospitalists listed on amion for assistance.  02/03/2024, 3:17 PM

## 2024-02-03 NOTE — Plan of Care (Signed)
 Patient ambulated down hallway with assistance of front wheel walker, tolerated activity well, returned to chair in room and sat for several hours before returning to bed.  Problem: Education: Goal: Knowledge of General Education information will improve Description: Including pain rating scale, medication(s)/side effects and non-pharmacologic comfort measures Outcome: Progressing   Problem: Health Behavior/Discharge Planning: Goal: Ability to manage health-related needs will improve Outcome: Progressing   Problem: Clinical Measurements: Goal: Ability to maintain clinical measurements within normal limits will improve Outcome: Progressing Goal: Will remain free from infection Outcome: Progressing Goal: Diagnostic test results will improve Outcome: Progressing Goal: Respiratory complications will improve Outcome: Progressing Goal: Cardiovascular complication will be avoided Outcome: Progressing   Problem: Activity: Goal: Risk for activity intolerance will decrease Outcome: Progressing   Problem: Nutrition: Goal: Adequate nutrition will be maintained Outcome: Progressing   Problem: Coping: Goal: Level of anxiety will decrease Outcome: Progressing   Problem: Elimination: Goal: Will not experience complications related to bowel motility Outcome: Progressing Goal: Will not experience complications related to urinary retention Outcome: Progressing   Problem: Pain Managment: Goal: General experience of comfort will improve and/or be controlled Outcome: Progressing   Problem: Safety: Goal: Ability to remain free from injury will improve Outcome: Progressing   Problem: Skin Integrity: Goal: Risk for impaired skin integrity will decrease Outcome: Progressing

## 2024-02-03 NOTE — Plan of Care (Signed)

## 2024-02-03 NOTE — Progress Notes (Signed)
 [facility]  Doing fair. No flatus or Bms. Cr improving. Foley in place.  BP 136/66 (BP Location: Right Arm)   Pulse 91   Temp 97.8 F (36.6 C) (Oral)   Resp 18   Ht 6\' 1"  (1.854 m)   Wt 94.8 kg   SpO2 98%   BMI 27.57 kg/m  Soft, distended, appropriately tender, staples c/d/I with honeycomb  Patient s/p Ex lap, SBR for volvulus. Doing fair.   NPO, NG, awaiting bowel function Ambulate off suction Cr improving with AKI likely from retention   Dr. Larrie Po will see tomorrow  Deena Farrier, MD [facility] [address] [place] [place], [place] [address] [phone] (office)

## 2024-02-04 DIAGNOSIS — I10 Essential (primary) hypertension: Secondary | ICD-10-CM | POA: Diagnosis not present

## 2024-02-04 DIAGNOSIS — F1010 Alcohol abuse, uncomplicated: Secondary | ICD-10-CM | POA: Diagnosis not present

## 2024-02-04 DIAGNOSIS — K56609 Unspecified intestinal obstruction, unspecified as to partial versus complete obstruction: Secondary | ICD-10-CM | POA: Diagnosis not present

## 2024-02-04 DIAGNOSIS — N179 Acute kidney failure, unspecified: Secondary | ICD-10-CM | POA: Diagnosis not present

## 2024-02-04 LAB — BASIC METABOLIC PANEL WITH GFR
Anion gap: 11 (ref 5–15)
BUN: 34 mg/dL — ABNORMAL HIGH (ref 8–23)
CO2: 22 mmol/L (ref 22–32)
Calcium: 8.1 mg/dL — ABNORMAL LOW (ref 8.9–10.3)
Chloride: 107 mmol/L (ref 98–111)
Creatinine, Ser: 1.42 mg/dL — ABNORMAL HIGH (ref 0.61–1.24)
GFR, Estimated: 51 mL/min — ABNORMAL LOW (ref >60)
Glucose, Bld: 104 mg/dL — ABNORMAL HIGH (ref 70–99)
Potassium: 3.7 mmol/L (ref 3.5–5.1)
Sodium: 140 mmol/L (ref 135–145)

## 2024-02-04 LAB — CBC WITH DIFFERENTIAL/PLATELET
Abs Immature Granulocytes: 0.02 10*3/uL (ref 0.00–0.07)
Basophils Absolute: 0 10*3/uL (ref 0.0–0.1)
Basophils Relative: 0 %
Eosinophils Absolute: 0.1 10*3/uL (ref 0.0–0.5)
Eosinophils Relative: 1 %
HCT: 39.1 % (ref 39.0–52.0)
Hemoglobin: 12.1 g/dL — ABNORMAL LOW (ref 13.0–17.0)
Immature Granulocytes: 0 %
Lymphocytes Relative: 9 %
Lymphs Abs: 0.6 10*3/uL — ABNORMAL LOW (ref 0.7–4.0)
MCH: 29.1 pg (ref 26.0–34.0)
MCHC: 30.9 g/dL (ref 30.0–36.0)
MCV: 94 fL (ref 80.0–100.0)
Monocytes Absolute: 0.5 10*3/uL (ref 0.1–1.0)
Monocytes Relative: 7 %
Neutro Abs: 5.9 10*3/uL (ref 1.7–7.7)
Neutrophils Relative %: 83 %
Platelets: 141 10*3/uL — ABNORMAL LOW (ref 150–400)
RBC: 4.16 MIL/uL — ABNORMAL LOW (ref 4.22–5.81)
RDW: 14.5 % (ref 11.5–15.5)
WBC: 7.1 10*3/uL (ref 4.0–10.5)
nRBC: 0 % (ref 0.0–0.2)

## 2024-02-04 MED ORDER — DEXTROSE-SODIUM CHLORIDE 5-0.9 % IV SOLN: INTRAVENOUS | Status: AC

## 2024-02-04 MED ORDER — HYDRALAZINE HCL 20 MG/ML IJ SOLN: 10.0000 mg | INTRAMUSCULAR | Status: DC | PRN

## 2024-02-04 MED ORDER — DOFETILIDE 500 MCG PO CAPS
500.0000 ug | ORAL_CAPSULE | Freq: Two times a day (BID) | ORAL | Status: DC
  Administered 2024-02-04 – 2024-02-05 (×2): 500 ug via ORAL
  Filled 2024-02-04 (×3): qty 1

## 2024-02-04 MED ORDER — BISACODYL 10 MG RE SUPP
10.0000 mg | Freq: Two times a day (BID) | RECTAL | Status: DC
  Administered 2024-02-04: 10 mg via RECTAL
  Filled 2024-02-04: qty 1

## 2024-02-04 MED ORDER — POTASSIUM CHLORIDE 10 MEQ/100ML IV SOLN
10.0000 meq | INTRAVENOUS | Status: AC
  Administered 2024-02-04 (×4): 10 meq via INTRAVENOUS
  Filled 2024-02-04 (×2): qty 100

## 2024-02-04 NOTE — Progress Notes (Signed)
 Patients NG tube removed this shift, he had 1 BM and then ambulated to the bathroom and had another BM , he also voided in the commode without difficulty . Foley removed earlier in shift. No c/o pain or discomfort at this time. Abdominal binder in place patient able to tolerate clear liquids at this time  02/04/24 1438  Stool Measurement/Characteristics  Bowel Incontinence No  Stool Type Type 4 (Like a smooth, soft sausage or snake)  Has the patient had three Type 7 stools in the last 24 hours? No  Stool Descriptors Brown  Stool Amount Large  Stool Source Rectum

## 2024-02-04 NOTE — Progress Notes (Signed)
 Mobility Specialist Progress Note:    02/04/24 1454  Therapy Vitals  Pulse Rate 91  BP (!) 162/74  Patient Position (if appropriate) Sitting  Oxygen Therapy  SpO2 99 %  O2 Device Room Air  Mobility  Activity Ambulated with assistance in hallway  Level of Assistance Standby assist, set-up cues, supervision of patient - no hands on  Assistive Device Front wheel walker  Distance Ambulated (ft) 250 ft  Range of Motion/Exercises Active;All extremities  Activity Response Tolerated well  Mobility Referral Yes  Mobility visit 1 Mobility  Mobility Specialist Start Time (ACUTE ONLY) 1435  Mobility Specialist Stop Time (ACUTE ONLY) 1454  Mobility Specialist Time Calculation (min) (ACUTE ONLY) 19 min   Pt received in bed, agreeable to mobility. Required supervision to stand and ambulate with RW. Tolerated well, c/o seeing "floaters" or "flashes" during session. Standing rest break, denies SOB, dizziness, lightheadedness and headache. Returned pt to room, VSS. All needs met.   Glinda Lapping Mobility Specialist Please contact via Special educational needs teacher or  Rehab office at [phone]

## 2024-02-04 NOTE — TOC Initial Note (Signed)
 Transition of Care [facility]) - Initial/Assessment Note    Patient Details  Name: Samuel Ferrell MRN: [id number] Date of Birth: [birth year]  Transition of Care [facility]) CM/SW Contact:    Grandville Lax, LCSWA Phone Number: 02/04/2024, 9:12 AM  Clinical Narrative:                 Pt is high risk for readmission. CSW spoke with pt to complete assessment. Pt lives with his significant other. Pt states he is independent in completing his ADLs and drives when needed. Pt states he has not had HH and does not feel it is needed. Pt has a cane and walker to use when needed. TOC to follow.   Expected Discharge Plan: Home/Self Care Barriers to Discharge: Continued Medical Work up   Patient Goals and CMS Choice Patient states their goals for this hospitalization and ongoing recovery are:: return home CMS Medicare.gov Compare Post Acute Care list provided to:: Patient Choice offered to / list presented to : Patient      Expected Discharge Plan and Services In-house Referral: Clinical Social Work Discharge Planning Services: CM Consult   Living arrangements for the past 2 months: Single Family Home                                      Prior Living Arrangements/Services Living arrangements for the past 2 months: Single Family Home Lives with:: Significant Other, Self Patient language and need for interpreter reviewed:: Yes Do you feel safe going back to the place where you live?: Yes      Need for Family Participation in Patient Care: Yes (Comment) Care giver support system in place?: Yes (comment) Current home services: DME Criminal Activity/Legal Involvement Pertinent to Current Situation/Hospitalization: No - Comment as needed  Activities of Daily Living   ADL Screening (condition at time of admission) Independently performs ADLs?: Yes (appropriate for developmental age) Is the patient deaf or have difficulty hearing?: No Does the patient have difficulty seeing, even when wearing  glasses/contacts?: No Does the patient have difficulty concentrating, remembering, or making decisions?: No  Permission Sought/Granted                  Emotional Assessment Appearance:: Appears stated age Attitude/Demeanor/Rapport: Engaged Affect (typically observed): Accepting Orientation: : Oriented to Self, Oriented to Place, Oriented to  Time, Oriented to Situation Alcohol / Substance Use: Not Applicable Psych Involvement: No (comment)  Admission diagnosis:  SBO (small bowel obstruction) (HCC) [K56.609] Patient Active Problem List   Diagnosis Date Noted   Acute urinary retention 02/02/2024   SBO (small bowel obstruction) (HCC) 01/31/2024   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   Small bowel obstruction (HCC) 11/15/2023   AKI (acute kidney injury) (HCC) 11/15/2023   Essential hypertension 11/15/2023   Mixed hyperlipidemia 11/15/2023   Atrial fibrillation, chronic (HCC) 11/15/2023   COPD (chronic obstructive pulmonary disease) (HCC) 10/11/2023   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   Adenocarcinoma of left lung (HCC) 05/19/2022   Lung nodule    Renal artery stenosis (HCC) 02/14/2022   Paroxysmal atrial fibrillation (HCC) 03/30/2020   HNP (herniated nucleus pulposus) with myelopathy, 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   Pre-operative cardiovascular examination 07/12/2018   CVA (cerebral vascular accident) (HCC) 06/25/2018   TIA (transient ischemic attack)    PAF (paroxysmal atrial fibrillation) (HCC)    Osteoarthritis of spine with radiculopathy, cervical region    Alcohol abuse, daily  use    History of colonic polyps 05/28/2014   Chronic anticoagulation 05/28/2014   Atypical atrial flutter (HCC) 01/29/2014   Alcohol abuse 01/29/2014   Tobacco abuse 01/29/2014   PCP:  Suzzanne Estrin, MD Pharmacy:   [facility] - [place], [place] - [address] [place] [address] Phone: [phone] Fax:  [phone]  [id number] - [place], [place] - [address] [place] [address] Phone: [phone] Fax: [phone]  [facility] [phone] - [place], [place] - [address] AT [facility] [address] [place] [address] Phone: [phone] Fax: [phone]     Social Drivers of Health (SDOH) Social History: SDOH Screenings   Food Insecurity: No Food Insecurity (02/01/2024)  Housing: Low Risk  (02/01/2024)  Transportation Needs: Unmet Transportation Needs (02/01/2024)  Utilities: Not At Risk (02/01/2024)  Social Connections: Unknown (02/01/2024)  Tobacco Use: High Risk (02/01/2024)   SDOH Interventions:     Readmission Risk Interventions    02/04/2024    9:11 AM 11/19/2023    1:17 PM 11/15/2023   11:36 AM  Readmission Risk Prevention Plan  Transportation Screening Complete Complete Complete  Home Care Screening   Complete  Medication Review (RN CM)   Complete  HRI or Home Care Consult Complete Complete   Social Work Consult for Recovery Care Planning/Counseling Complete Complete   Palliative Care Screening Not Applicable Not Applicable   Medication Review Oceanographer) Complete Complete

## 2024-02-04 NOTE — Progress Notes (Signed)
 3 Days Post-Op  Subjective: Patient has not passed flatus or had a bowel movement yet.  Objective: Vital signs in last 24 hours: Temp:  [97.9 F (36.6 C)-98 F (36.7 C)] 98 F (36.7 C) (05/26 0512) Pulse Rate:  [81-94] 84 (05/26 0512) Resp:  [17-18] 18 (05/26 0512) BP: (133-170)/(64-71) 170/71 (05/26 0512) SpO2:  [95 %-98 %] 96 % (05/26 0747) Last BM Date : 01/31/24  Intake/Output from previous day: 05/25 0701 - 05/26 0700 In: 1233.2 [I.V.:283.2; IV Piggyback:950] Out: 3550 [Urine:2750; Emesis/NG output:800] Intake/Output this shift: No intake/output data recorded.  General appearance: alert, cooperative, and no distress GI: Soft but mildly distended.  No active bowel sounds appreciated.  Incision healing well.  Lab Results:  Recent Labs    02/03/24 0250 02/04/24 0645  WBC 6.9 7.1  HGB 12.0* 12.1*  HCT 36.7* 39.1  PLT 160 141*   BMET Recent Labs    02/03/24 0250 02/04/24 0645  NA 138 140  K 3.9 3.7  CL 114* 107  CO2 21* 22  GLUCOSE 119* 104*  BUN 50* 34*  CREATININE 2.13* 1.42*  CALCIUM  7.6* 8.1*   PT/INR No results for input(s): "LABPROT", "INR" in the last 72 hours.  Studies/Results: No results found.  Anti-infectives: Anti-infectives (From admission, onward)    Start     Dose/Rate Route Frequency Ordered Stop   02/02/24 0900  doxycycline (VIBRAMYCIN) 100 mg in sodium chloride  0.9 % 250 mL IVPB        100 mg 125 mL/hr over 120 Minutes Intravenous Every 12 hours 02/02/24 0801 02/05/24 0728   02/02/24 0900  cefTRIAXone (ROCEPHIN) 2 g in sodium chloride  0.9 % 100 mL IVPB        2 g 200 mL/hr over 30 Minutes Intravenous Every 24 hours 02/02/24 0801 02/04/24 1009   02/01/24 2200  cefoTEtan  (CEFOTAN ) 2 g in sodium chloride  0.9 % 100 mL IVPB        2 g 200 mL/hr over 30 Minutes Intravenous  Once 02/01/24 1404 02/02/24 0850   02/01/24 1101  sodium chloride  0.9 % with cefoTEtan  (CEFOTAN ) ADS Med       Note to Pharmacy: Samuel Ferrell S: cabinet override       02/01/24 1101 02/01/24 1145   02/01/24 1100  cefoTEtan  (CEFOTAN ) 2 g in sodium chloride  0.9 % 100 mL IVPB        2 g 200 mL/hr over 30 Minutes Intravenous On call to O.R. 02/01/24 1047 02/02/24 0849       Assessment/Plan: s/p Procedure(s): LAPAROTOMY, EXPLORATORY;TICK REMOVAL. EXCISION, SMALL INTESTINE Impression: Stable on postoperative day 3.  Awaiting return of bowel function.  Will give Dulcolax suppository.  Should he start having flatus, would remove NG tube.  Continue ambulation.  LOS: 4 days    Samuel Ferrell 02/04/2024

## 2024-02-04 NOTE — Progress Notes (Signed)
 PROGRESS NOTE   Samuel Ferrell  ZOX:[id number] DOB: 13-Jun-1946 DOA: 01/31/2024 PCP: Tisovec, Richard W, MD   Chief Complaint  Patient presents with   Abdominal Pain   Nausea   Level of care: Telemetry  Brief Admission History:  78 y.o. male with medical history significant of atrial fibrillation, alcohol abuse, COPD, GERD, history of CVA, hypertension, and hyperlipidemia who presented with abdominal pain, and nausea.  Recent hospitalization 03/05 to 11/21/23 for small bowel obstruction due to volvulus, he required surgical intervention with improvement of his symptoms.    Patient has been doing well, at his usual state of health until today around 3 pm when he developed acute onset of abdominal pain. It has sharp in nature,10/10 in intensity and associated with nausea but not vomiting. He noted abdominal distention, diaphoresis and chills.  Because of severity and persistence of his symptoms he presented to the ED and determined to have recurrent volvulus on CT images.     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  If has a bowel movement discussed with surgeon that NG tube can be removed today.    Mixed hyperlipidemia Hold statin therapy for now  Essential hypertension Continue blood pressure monitoring, for now continue to hold on antihypertensive medications  Hold enalapril  due to AKI.   Hydralazine  IV ordered as needed.   AKI (acute kidney injury)  Postobstructive given acute urinary retention Hopefully renal function will improved  after foley cath placement  Acute urinary retention  - foley cath placed 02/02/24 - added tamsulosin  0.4 mg daily  - foley cath removed on 02/04/24  - monitor voiding   UTI  - completing IV ceftriaxone on 5/26  Question of Left pneumonia vs atelectasis - completing 3 days of IV antibiotics on 5/26   Paroxysmal atrial fibrillation  Hold anticoagulation for surgery   Resumed home oral dofetilide  and metoprolol    Continue telemetry monitoring. Keep K at 4 and Mg at 2.   Alcohol abuse, daily use No signs of acute withdrawal CIWA protocol   DVT prophylaxis: SCDs Code Status: Full  Family Communication:  Disposition: TBD    Consultants:   Procedures:   Antimicrobials:    Subjective: Says he really wants NG out today and foley out today.        Objective: Vitals:   02/04/24 0512 02/04/24 0739 02/04/24 0743 02/04/24 0747  BP: (!) 170/71     Pulse: 84     Resp: 18     Temp: 98 F (36.7 C)     TempSrc: Axillary     SpO2: 98% 96% 96% 96%  Weight:      Height:        Intake/Output Summary (Last 24 hours) at 02/04/2024 1424 Last data filed at 02/04/2024 0512 Gross per 24 hour  Intake 983.19 ml  Output 2750 ml  Net -1766.81 ml   Filed Weights   01/31/24 2010  Weight: 94.8 kg   Examination:  General exam: Appears calm and uncomfortable from NG.  He is irritable.  Respiratory system: Clear to auscultation. Respiratory effort normal. Cardiovascular system: normal  S1 & S2 heard. No JVD, murmurs, rubs, gallops or clicks. No pedal edema. Gastrointestinal system: Abdomen is soft and with appropriate tenderness. No organomegaly or masses felt. hypoactive bowel sounds.  Central nervous system: Alert and oriented. No focal neurological deficits. Extremities: Symmetric 5 x 5 power. Skin: No rashes, lesions or ulcers. Psychiatry: Judgement and insight appear normal. Mood & affect irritable.   Data Reviewed: I have personally reviewed following labs and imaging  studies  CBC: Recent Labs  Lab 01/31/24 2020 02/01/24 0439 02/02/24 0410 02/03/24 0250 02/04/24 0645  WBC 8.9 7.1 6.3 6.9 7.1  NEUTROABS  --   --  4.9 5.6 5.9  HGB 13.3 15.7 13.4 12.0* 12.1*  HCT 42.3 50.9 41.7 36.7* 39.1  MCV 93.2 95.1 93.5 91.8 94.0  PLT 194 229 188 160 141*    Basic Metabolic Panel: Recent Labs  Lab 01/31/24 2020 02/01/24 0439 02/02/24 0410 02/03/24 0250 02/04/24 0645  NA 135 139 140 138 140  K 3.8 4.0 4.3 3.9 3.7  CL 104 111 111 114* 107  CO2 21* 16* 17* 21* 22  GLUCOSE 141* 164* 157* 119* 104*  BUN 17 24* 54* 50* 34*  CREATININE 1.41* 1.71* 3.76* 2.13* 1.42*  CALCIUM  9.0 8.6* 7.1* 7.6* 8.1*  MG  --  2.0 1.8 2.4  --     CBG: No results for input(s): "GLUCAP" in the last 168 hours.  No results found for this or any previous visit (from the past 240 hours).   Radiology Studies: No results found.   Scheduled Meds:  arformoterol   15 mcg Nebulization BID   And   umeclidinium bromide   1 puff Inhalation Daily   atorvastatin   40 mg Oral QPM   bisacodyl   10 mg Rectal q12n4p   budesonide (PULMICORT) nebulizer solution  0.25 mg Nebulization BID   Chlorhexidine  Gluconate Cloth  6 each Topical Daily   dofetilide   500 mcg Oral BID   folic acid  1 mg Oral Daily   metoprolol  tartrate  75 mg Oral BID   multivitamin with minerals  1 tablet Oral Daily   pantoprazole  (PROTONIX ) IV  40 mg Intravenous Q24H   tamsulosin   0.4 mg Oral QPC breakfast   thiamine   100 mg Oral Daily   Or   thiamine   100 mg Intravenous Daily   Continuous Infusions:  dextrose  5 % and 0.9 % NaCl 50 mL/hr at 02/04/24 1040   doxycycline (VIBRAMYCIN) IV 100 mg (02/04/24 0940)   potassium chloride  10 mEq (02/04/24 1402)     LOS: 4 days   Time spent: 55 mins  Nahara Dona Lincoln Renshaw, MD How to contact the [facility] Attending or Consulting provider 7A - 7P or covering provider during after hours 7P -7A, for this patient?  Check the care team in [facility] and look for a) attending/consulting TRH  provider listed and b) the TRH team listed Log into [email or web address] to find provider on call.  Locate the TRH provider you are looking for under [facility] and page to a number that you can be directly reached. If you still have difficulty reaching the provider, please page the [facility] (Director on Call) for the Hospitalists listed on amion for assistance.  02/04/2024, 2:24 PM

## 2024-02-05 DIAGNOSIS — R338 Other retention of urine: Secondary | ICD-10-CM

## 2024-02-05 DIAGNOSIS — Z7901 Long term (current) use of anticoagulants: Secondary | ICD-10-CM

## 2024-02-05 DIAGNOSIS — N179 Acute kidney failure, unspecified: Secondary | ICD-10-CM | POA: Diagnosis not present

## 2024-02-05 DIAGNOSIS — K56609 Unspecified intestinal obstruction, unspecified as to partial versus complete obstruction: Secondary | ICD-10-CM | POA: Diagnosis not present

## 2024-02-05 LAB — CBC WITH DIFFERENTIAL/PLATELET
Abs Immature Granulocytes: 0.02 10*3/uL (ref 0.00–0.07)
Basophils Absolute: 0 10*3/uL (ref 0.0–0.1)
Basophils Relative: 0 %
Eosinophils Absolute: 0.1 10*3/uL (ref 0.0–0.5)
Eosinophils Relative: 2 %
HCT: 35.6 % — ABNORMAL LOW (ref 39.0–52.0)
Hemoglobin: 11.2 g/dL — ABNORMAL LOW (ref 13.0–17.0)
Immature Granulocytes: 0 %
Lymphocytes Relative: 15 %
Lymphs Abs: 0.8 10*3/uL (ref 0.7–4.0)
MCH: 28.9 pg (ref 26.0–34.0)
MCHC: 31.5 g/dL (ref 30.0–36.0)
MCV: 92 fL (ref 80.0–100.0)
Monocytes Absolute: 0.7 10*3/uL (ref 0.1–1.0)
Monocytes Relative: 12 %
Neutro Abs: 3.7 10*3/uL (ref 1.7–7.7)
Neutrophils Relative %: 71 %
Platelets: 141 10*3/uL — ABNORMAL LOW (ref 150–400)
RBC: 3.87 MIL/uL — ABNORMAL LOW (ref 4.22–5.81)
RDW: 14.6 % (ref 11.5–15.5)
WBC: 5.3 10*3/uL (ref 4.0–10.5)
nRBC: 0 % (ref 0.0–0.2)

## 2024-02-05 LAB — BASIC METABOLIC PANEL WITH GFR
Anion gap: 4 — ABNORMAL LOW (ref 5–15)
BUN: 24 mg/dL — ABNORMAL HIGH (ref 8–23)
CO2: 20 mmol/L — ABNORMAL LOW (ref 22–32)
Calcium: 8 mg/dL — ABNORMAL LOW (ref 8.9–10.3)
Chloride: 112 mmol/L — ABNORMAL HIGH (ref 98–111)
Creatinine, Ser: 1.07 mg/dL (ref 0.61–1.24)
GFR, Estimated: >60 mL/min (ref >60)
Glucose, Bld: 102 mg/dL — ABNORMAL HIGH (ref 70–99)
Potassium: 3.9 mmol/L (ref 3.5–5.1)
Sodium: 136 mmol/L (ref 135–145)

## 2024-02-05 LAB — SURGICAL PATHOLOGY

## 2024-02-05 LAB — PHOSPHORUS: Phosphorus: 2.5 mg/dL (ref 2.5–4.6)

## 2024-02-05 LAB — MAGNESIUM: Magnesium: 1.9 mg/dL (ref 1.7–2.4)

## 2024-02-05 MED ORDER — OXYCODONE HCL 5 MG PO TABS: 5.0000 mg | ORAL_TABLET | Freq: Three times a day (TID) | ORAL | 0 refills | Status: AC | PRN

## 2024-02-05 MED ORDER — TAMSULOSIN HCL 0.4 MG PO CAPS: 0.4000 mg | ORAL_CAPSULE | Freq: Every day | ORAL | 2 refills | Status: DC

## 2024-02-05 MED ORDER — DOCUSATE SODIUM 100 MG PO CAPS: 100.0000 mg | ORAL_CAPSULE | Freq: Two times a day (BID) | ORAL | 1 refills | Status: AC

## 2024-02-05 MED ORDER — DOCUSATE SODIUM 100 MG PO CAPS
100.0000 mg | ORAL_CAPSULE | Freq: Two times a day (BID) | ORAL | Status: DC
Start: 2024-02-05 — End: 2024-02-05
  Filled 2024-02-05: qty 1

## 2024-02-05 MED ORDER — OXYCODONE HCL 5 MG PO TABS: 5.0000 mg | ORAL_TABLET | Freq: Three times a day (TID) | ORAL | 0 refills | Status: DC | PRN

## 2024-02-05 MED ORDER — DOCUSATE SODIUM 100 MG PO CAPS: 100.0000 mg | ORAL_CAPSULE | Freq: Two times a day (BID) | ORAL | 1 refills | Status: DC

## 2024-02-05 MED ORDER — TAMSULOSIN HCL 0.4 MG PO CAPS: 0.4000 mg | ORAL_CAPSULE | Freq: Every day | ORAL | 2 refills | Status: AC

## 2024-02-05 MED ORDER — OXYCODONE HCL 5 MG PO TABS: 5.0000 mg | ORAL_TABLET | ORAL | Status: DC | PRN

## 2024-02-05 MED ORDER — POTASSIUM CHLORIDE CRYS ER 20 MEQ PO TBCR
40.0000 meq | EXTENDED_RELEASE_TABLET | Freq: Once | ORAL | Status: AC
  Administered 2024-02-05: 40 meq via ORAL
  Filled 2024-02-05: qty 2

## 2024-02-05 NOTE — Discharge Instructions (Addendum)
 Discharge Open Abdominal Surgery Instructions:  Common Complaints: Pain at the incision site is common. This will improve with time. Take your pain medications as described below. Some nausea is common and poor appetite. The main goal is to stay hydrated the first few days after surgery.   Diet/ Activity: Diet as tolerated. You have started and tolerated a diet in the hospital, and should continue to increase what you are able to eat.   You may not have a large appetite, but it is important to stay hydrated. Drink 64 ounces of water a day. Your appetite will return with time.  Keep a dry dressing in place over your staples daily or as needed. Some minor pink/ blood tinged drainage is expected. This will stop in a few days after surgery.  Shower per your regular routine daily.  Do not take hot showers. Take warm showers that are less than 10 minutes. Pat the incision dry. Wear an abdominal binder daily with activity. You do not have to wear this while sleeping or sitting.  Rest and listen to your body, but do not remain in bed all day.  Walk everyday for at least 15-20 minutes. Deep cough and move around every 1-2 hours in the first few days after surgery.  Do not lift > 10 lbs, perform excessive bending, pushing, pulling, squatting for 6-8 weeks after surgery.  The activity restrictions and the abdominal binder are to prevent hernia formation at your incision while you are healing.  Do not place lotions or balms on your incision unless instructed to specifically by Dr. Collene Dawson.   Pain Expectations and Narcotics: -After surgery you will have pain associated with your incisions and this is normal. The pain is muscular and nerve pain, and will get better with time. -You are encouraged and expected to take non narcotic medications like tylenol  and ibuprofen (when able) to treat pain as multiple modalities can aid with pain treatment. -Narcotics are only used when pain is severe or there is  breakthrough pain. -You are not expected to have a pain score of 0 after surgery, as we cannot prevent pain. A pain score of 3-4 that allows you to be functional, move, walk, and tolerate some activity is the goal. The pain will continue to improve over the days after surgery and is dependent on your surgery. -Due to Miguel Barrera Sparacino, we are only able to give a certain amount of pain medication to treat post operative pain, and we only give additional narcotics on a patient by patient basis.  -For most laparoscopic surgery, studies have shown that the majority of patients only need 10-15 narcotic pills, and for open surgeries most patients only need 15-20.   -Having appropriate expectations of pain and knowledge of pain management with non narcotics is important as we do not want anyone to become addicted to narcotic pain medication.  -Using ice packs in the first 48 hours and heating pads after 48 hours, wearing an abdominal binder (when recommended), and using over the counter medications are all ways to help with pain management.   -Simple acts like meditation and mindfulness practices after surgery can also help with pain control and research has proven the benefit of these practices.  Medication: Take tylenol  and ibuprofen as needed for pain control, alternating every 4-6 hours.  Example:  Tylenol  1000mg  @ 6am, 12noon, 6pm, (Do not exceed 4000mg  of tylenol  a day). Ibuprofen 800mg  @ 9am, 3pm, 9pm, 3am (Do not exceed 3600mg  of ibuprofen a day).  Take Roxicodone  for breakthrough pain every 4 hours.  Take Colace for constipation related to narcotic pain medication. If you do not have a bowel movement in 2 days, take Miralax  over the counter.  Drink plenty of water to also prevent constipation.   Contact Information: If you have questions or concerns, please call our office, [phone], Monday- Thursday 8AM-5PM and Friday 8AM-12Noon.  If it is after hours or on the weekend, please call [facility]  Main Number, [phone], [phone], and ask to speak to the surgeon on call for Dr. Collene Dawson at [facility].       IMPORTANT INFORMATION: PAY CLOSE ATTENTION   PHYSICIAN DISCHARGE INSTRUCTIONS  Follow with Primary care provider  Tisovec, Kristina Pfeiffer, MD  and other consultants as instructed by your Hospitalist Physician  SEEK MEDICAL CARE OR RETURN TO EMERGENCY ROOM IF SYMPTOMS COME BACK, WORSEN OR NEW PROBLEM DEVELOPS   Please note: You were cared for by a hospitalist during your hospital stay. Every effort will be made to forward records to your primary care provider.  You can request that your primary care provider send for your hospital records if they have not received them.  Once you are discharged, your primary care physician will handle any further medical issues. Please note that NO REFILLS for any discharge medications will be authorized once you are discharged, as it is imperative that you return to your primary care physician (or establish a relationship with a primary care physician if you do not have one) for your post hospital discharge needs so that they can reassess your need for medications and monitor your lab values.  Please get a complete blood count and chemistry panel checked by your Primary MD at your next visit, and again as instructed by your Primary MD.  Get Medicines reviewed and adjusted: Please take all your medications with you for your next visit with your Primary MD  Laboratory/radiological data: Please request your Primary MD to go over all hospital tests and procedure/radiological results at the follow up, please ask your primary care provider to get all Hospital records sent to his/her office.  In some cases, they will be blood work, cultures and biopsy results pending at the time of your discharge. Please request that your primary care provider follow up on these results.  If you are diabetic, please bring your blood sugar readings with you to your  follow up appointment with primary care.    Please call and make your follow up appointments as soon as possible.    Also Note the following: If you experience worsening of your admission symptoms, develop shortness of breath, life threatening emergency, suicidal or homicidal thoughts you must seek medical attention immediately by calling 911 or calling your MD immediately  if symptoms less severe.  You must read complete instructions/literature along with all the possible adverse reactions/side effects for all the Medicines you take and that have been prescribed to you. Take any new Medicines after you have completely understood and accpet all the possible adverse reactions/side effects.   Do not drive when taking Pain medications or sleeping medications (Benzodiazepines)  Do not take more than prescribed Pain, Sleep and Anxiety Medications. It is not advisable to combine anxiety,sleep and pain medications without talking with your primary care practitioner  Special Instructions: If you have smoked or chewed Tobacco  in the last 2 yrs please stop smoking, stop any regular Alcohol  and or any Recreational drug use.  Wear Seat belts while driving.  Do  not drive if taking any narcotic, mind altering or controlled substances or recreational drugs or alcohol.

## 2024-02-05 NOTE — Plan of Care (Signed)
  Problem: Education: Goal: Knowledge of General Education information will improve Description: Including pain rating scale, medication(s)/side effects and non-pharmacologic comfort measures Outcome: Adequate for Discharge   Problem: Health Behavior/Discharge Planning: Goal: Ability to manage health-related needs will improve Outcome: Adequate for Discharge   Problem: Clinical Measurements: Goal: Ability to maintain clinical measurements within normal limits will improve Outcome: Adequate for Discharge   Problem: Activity: Goal: Risk for activity intolerance will decrease Outcome: Adequate for Discharge   Problem: Nutrition: Goal: Adequate nutrition will be maintained Outcome: Progressing   Problem: Elimination: Goal: Will not experience complications related to bowel motility Outcome: Progressing

## 2024-02-05 NOTE — Discharge Summary (Signed)
 Physician Discharge Summary  Samuel Ferrell WUJ:[id number] DOB: [birth year] DOA: 01/31/2024  PCP: Suzzanne Estrin, MD Surgery: Dr. Collene Dawson  Admit date: 01/31/2024 Discharge date: 02/05/2024  Admitted From:  Home  Disposition: Home   Recommendations for Outpatient Follow-up:  Follow up with Dr. Collene Dawson as scheduled on 02/14/24 for staple removal  Discharge Condition: STABLE   CODE STATUS: FULL DIET: SOFT FOODS RECOMMENDED, advance as tolerated    Brief Hospitalization Summary: Please see all hospital notes, images, labs for full details of the hospitalization. Admission provider HPI:  78 y.o. male with medical history significant of atrial fibrillation, alcohol abuse, COPD, GERD, history of CVA, hypertension, and hyperlipidemia who presented with abdominal pain, and nausea.  Recent hospitalization 03/05 to 11/21/23 for small bowel obstruction due to volvulus, he required surgical intervention with improvement of his symptoms.    Patient has been doing well, at his usual state of health until today around 3 pm when he developed acute onset of abdominal pain. It has sharp in nature,10/10 in intensity and associated with nausea but not vomiting. He noted abdominal distention, diaphoresis and chills.  Because of severity and persistence of his symptoms he presented to the ED and determined to have recurrent volvulus on CT images.    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  NG removed on 5/26 after bowel  movement Pt is tolerating soft diet and having multiple bowel movements and eager to discharge home   Mixed hyperlipidemia Resume home atorvastatin    Essential hypertension Resume home BP medications Hydralazine  IV ordered as needed in hospital setting    AKI (acute kidney injury) - Resolved  Postobstructive given acute urinary retention Creatinine has normalized now    Acute urinary retention  - foley cath placed 02/02/24 - added tamsulosin  0.4 mg daily  - foley cath removed on 02/04/24  - Pt has been voiding with no difficulty, continue tamsulosin    UTI  - completed IV ceftriaxone on 5/26   Question of Left pneumonia vs atelectasis - completed 3 days of IV antibiotics on 5/26    Paroxysmal atrial fibrillation  Resume anticoagulation   Resumed home oral dofetilide  and metoprolol    Keep K at 4 and Mg at 2.    Alcohol abuse, daily use No signs of acute withdrawal CIWA protocol finished  Discharge Diagnoses:  Principal Problem:   SBO (small bowel obstruction) (HCC) Active Problems:   Alcohol abuse   Tobacco abuse   Chronic anticoagulation   PAF (paroxysmal atrial fibrillation) (HCC)   Alcohol abuse, daily use   Paroxysmal atrial fibrillation (HCC)   Renal artery stenosis (HCC)   Adenocarcinoma of left lung (HCC)   COPD (chronic obstructive pulmonary disease) (HCC)   AKI (acute kidney injury) (HCC)   Essential hypertension   Mixed hyperlipidemia   Volvulus of small intestine (HCC)   Acute urinary retention   Discharge Instructions:  Allergies as of 02/05/2024   No Known Allergies      Medication List     TAKE these medications    albuterol  108 (90 Base)  MCG/ACT inhaler Commonly known as: VENTOLIN  HFA Inhale 1-2 puffs into the lungs every 4 (four) hours as needed for shortness of breath or wheezing.   apixaban  5 MG Tabs tablet Commonly known as: ELIQUIS  Take 5 mg by mouth 2 (two) times daily.   aspirin  EC 81 MG tablet Take 81 mg by mouth daily.  Swallow whole.   atorvastatin  40 MG tablet Commonly known as: LIPITOR  Take 40 mg by mouth every evening.   docusate sodium  100 MG capsule Commonly known as: COLACE Take 1 capsule (100 mg total) by mouth 2 (two) times daily.   dofetilide  500 MCG capsule Commonly known as: TIKOSYN  Take 1 capsule (500 mcg total) by mouth 2 (two) times daily.   enalapril  10 MG tablet Commonly known as: VASOTEC  Take 1 tablet (10 mg total) by mouth at bedtime.   ferrous sulfate  325 (65 FE) MG tablet Take 325 mg by mouth daily with breakfast. 250mg  per patient   fluticasone-salmeterol 230-21 MCG/ACT inhaler Commonly known as: ADVAIR HFA Inhale 2 puffs into the lungs 2 (two) times daily.   Magnesium  250 MG Tabs Take 250 mg by mouth every evening.   metoprolol  tartrate 50 MG tablet Commonly known as: LOPRESSOR  Take 1.5 tablets (75 mg total) by mouth 2 (two) times daily.   mometasone 220 MCG/ACT inhaler Commonly known as: ASMANEX Inhale 2 puffs into the lungs daily.   multivitamin tablet Take 1 tablet by mouth daily. Centrum 50+   omeprazole 20 MG capsule Commonly known as: PRILOSEC Take 20 mg by mouth daily.   oxyCODONE  5 MG immediate release tablet Commonly known as: Roxicodone  Take 1 tablet (5 mg total) by mouth every 8 (eight) hours as needed for up to 3 days for severe pain (pain score 7-10).   potassium chloride  SA 20 MEQ tablet Commonly known as: Klor-Con  M20 Take 1 tablet (20 mEq total) by mouth daily.   tamsulosin  0.4 MG Caps capsule Commonly known as: FLOMAX  Take 1 capsule (0.4 mg total) by mouth daily after breakfast. Start taking on: Feb 06, 2024   vitamin C 250 MG tablet Commonly known as: ASCORBIC ACID Take 250 mg by mouth daily.        Follow-up Information     Awilda Bogus, MD Follow up on 02/14/2024.   Specialty: General Surgery Why: staple removal Contact information: [address] [place] [facility] [address] [phone]                No  Known Allergies Allergies as of 02/05/2024   No Known Allergies      Medication List     TAKE these medications    albuterol  108 (90 Base) MCG/ACT inhaler Commonly known as: VENTOLIN  HFA Inhale 1-2 puffs into the lungs every 4 (four) hours as needed for shortness of breath or wheezing.   apixaban  5 MG Tabs tablet Commonly known as: ELIQUIS  Take 5 mg by mouth 2 (two) times daily.   aspirin  EC 81 MG tablet Take 81 mg by mouth daily. Swallow whole.   atorvastatin  40 MG tablet Commonly known as: LIPITOR  Take 40 mg by mouth every evening.   docusate sodium  100 MG capsule Commonly known as: COLACE Take 1 capsule (100 mg total) by mouth 2 (two) times daily.   dofetilide  500 MCG capsule Commonly known as: TIKOSYN  Take 1 capsule (500 mcg total) by mouth 2 (two) times daily.   enalapril  10 MG tablet Commonly known as: VASOTEC  Take 1 tablet (10 mg total) by mouth at bedtime.  ferrous sulfate  325 (65 FE) MG tablet Take 325 mg by mouth daily with breakfast. 250mg  per patient   fluticasone-salmeterol 230-21 MCG/ACT inhaler Commonly known as: ADVAIR HFA Inhale 2 puffs into the lungs 2 (two) times daily.   Magnesium  250 MG Tabs Take 250 mg by mouth every evening.   metoprolol  tartrate 50 MG tablet Commonly known as: LOPRESSOR  Take 1.5 tablets (75 mg total) by mouth 2 (two) times daily.   mometasone  220 MCG/ACT inhaler Commonly known as: ASMANEX Inhale 2 puffs into the lungs daily.   multivitamin tablet Take 1 tablet by mouth daily. Centrum 50+   omeprazole 20 MG capsule Commonly known as: PRILOSEC Take 20 mg by mouth daily.   oxyCODONE  5 MG immediate release tablet Commonly known as: Roxicodone  Take 1 tablet (5 mg total) by mouth every 8 (eight) hours as needed for up to 3 days for severe pain (pain score 7-10).   potassium chloride  SA 20 MEQ tablet Commonly known as: Klor-Con  M20 Take 1 tablet (20 mEq total) by mouth daily.   tamsulosin  0.4 MG Caps  capsule Commonly known as: FLOMAX  Take 1 capsule (0.4 mg total) by mouth daily after breakfast. Start taking on: Feb 06, 2024   vitamin C 250 MG tablet Commonly known as: ASCORBIC ACID Take 250 mg by mouth daily.        Procedures/Studies: DG Chest 1 View Result Date: 02/02/2024 CLINICAL DATA:  [id number] Dyspnea [id number] EXAM: CHEST  1 VIEW COMPARISON:  11/16/2023 FINDINGS: Gastric tube extends at least as far as the stomach, tip not seen. Mild linear opacities in the left lower lung. Right lung clear. Heart size and mediastinal contours are within normal limits. Aortic Atherosclerosis (ICD10-170.0). Chronic blunting of left lateral costophrenic angle. Visualized bones unremarkable. IMPRESSION: Mild left lower lung atelectasis. Electronically Signed   By: Nicoletta Barrier M.D.   On: 02/02/2024 09:51   DG Abd Portable 1 View Result Date: 01/31/2024 CLINICAL DATA:  NG tube placement EXAM: PORTABLE ABDOMEN - 1 VIEW COMPARISON:  CT 01/31/2024 FINDINGS: Enteric tube tip and side port overlie the proximal stomach. Dilated air-filled small bowel corresponding to history of bowel obstruction IMPRESSION: Enteric tube tip and side port overlie the proximal stomach. Electronically Signed   By: Esmeralda Hedge M.D.   On: 01/31/2024 23:29   CT ABDOMEN PELVIS W CONTRAST Result Date: 01/31/2024 CLINICAL DATA:  Bowel obstruction EXAM: CT ABDOMEN AND PELVIS WITH CONTRAST TECHNIQUE: Multidetector CT imaging of the abdomen and pelvis was performed using the standard protocol following bolus administration of intravenous contrast. RADIATION DOSE REDUCTION: This exam was performed according to the departmental dose-optimization program which includes automated exposure control, adjustment of the mA and/or kV according to patient size and/or use of iterative reconstruction technique. CONTRAST:  OMNIPAQUE  IOHEXOL  300 MG/ML  SOLN COMPARISON:  None Available. FINDINGS: Lower chest: No acute abnormality. Extensive right  coronary artery calcification. Calcified pleural plaques are seen at the lung bases bilaterally, nonspecific. Hepatobiliary: Simple cysts scattered throughout the liver. Liver otherwise unremarkable. Gallbladder unremarkable. No intra or extrahepatic biliary ductal dilation. Pancreas: Unremarkable Spleen: Normal in size without focal abnormality. 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 Right adrenal gland is unremarkable. Layering calcification is seen within a simple cyst within the left kidney. Simple cortical cyst is in lower the right kidney. Mild asymmetric left renal cortical atrophy. The  kidneys are otherwise unremarkable. Bladder. Stomach/Bowel: There is a distal small-bowel obstruction secondary to mesenteric volvulus with the small bowel mesentery rotated 2 complete resolutions within right lower quadrant. The proximal small bowel is dilated and fluid-filled and the distal terminal ileum and colon are decompressed in keeping with a complete small obstruction. No frankly ischemic segments of bowel. Mild ascites. No free intraperitoneal gas. The stomach small bowel, large otherwise unremarkable. The appendix is absent. Vascular/Lymphatic: Extensive aortoiliac atherosclerotic calcification. Left renal artery stenting has been performed. 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 No pathologic adenopathy within the abdomen and pelvis. Reproductive: Prostate is unremarkable. Other: Small bilateral fat containing inguinal hernias. Musculoskeletal: No acute bone abnormality. No lytic or blastic bone lesion. Osseous structures are age appropriate. IMPRESSION: 1. 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 No frankly ischemic segments of bowel. Mild ascites. No  free intraperitoneal gas. 2. Extensive coronary artery calcification. 3. 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 4. 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 Correlation with serum laboratory examination, including serum metanephrines, may be helpful to exclude alternative considerations. 5. 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 Aortic Atherosclerosis (ICD10-I70.0). Electronically Signed   By: Worthy Heads M.D.   On: 01/31/2024 22:13     Subjective: Pt is having multiple bowel movements and tolerating diet and eager to go home today.   Discharge Exam: Vitals:   02/05/24 0800 02/05/24 0803  BP:    Pulse:    Resp:    Temp:    SpO2: 99% 95%   Vitals:   02/05/24 0408 02/05/24 0747 02/05/24 0800 02/05/24 0803  BP: (!) 140/70     Pulse: 81     Resp: 19     Temp: (!) 97.4 F (36.3 C)     TempSrc: Oral     SpO2: 100% 98% 99% 95%  Weight:      Height:        General: Pt is alert, awake, not in acute distress Cardiovascular: normal S1/S2 +, no rubs, no gallops Respiratory: CTA bilaterally, no wheezing, no rhonchi Abdominal: Soft, no masses, healing well;  bowel sounds + Extremities: no edema, no cyanosis   The results of significant diagnostics from this hospitalization (including imaging, microbiology, ancillary and laboratory) are listed below for reference.     Microbiology: No results found for this or any previous visit (from the past 240 hours).   Labs: BNP (last 3 results) No results for input(s): "BNP" in the last 8760 hours. Basic Metabolic Panel: Recent Labs  Lab 02/01/24 0439 02/02/24 0410 02/03/24 0250 02/04/24 0645 02/05/24 0456  NA 139 140 138 140 136  K 4.0 4.3 3.9 3.7 3.9  CL 111 111 114* 107 112*  CO2 16* 17* 21* 22 20*   GLUCOSE 164* 157* 119* 104* 102*  BUN 24* 54* 50* 34* 24*  CREATININE 1.71* 3.76* 2.13* 1.42* 1.07  CALCIUM  8.6* 7.1* 7.6* 8.1* 8.0*  MG 2.0 1.8 2.4  --  1.9  PHOS  --   --   --   --  2.5   Liver Function Tests: Recent Labs  Lab 01/31/24 2020 02/02/24 0410  AST 45* 24  ALT 80* 42  ALKPHOS 91 50  BILITOT 0.6 0.6  PROT  7.8 6.0*  ALBUMIN 4.2 2.7*   Recent Labs  Lab 01/31/24 2020  LIPASE 40   No results for input(s): "AMMONIA" in the last 168 hours. CBC: Recent Labs  Lab 02/01/24 0439 02/02/24 0410 02/03/24 0250 02/04/24 0645 02/05/24 0456  WBC 7.1 6.3 6.9 7.1 5.3  NEUTROABS  --  4.9 5.6 5.9 3.7  HGB 15.7 13.4 12.0* 12.1* 11.2*  HCT 50.9 41.7 36.7* 39.1 35.6*  MCV 95.1 93.5 91.8 94.0 92.0  PLT 229 188 160 141* 141*   Cardiac Enzymes: No results for input(s): "CKTOTAL", "CKMB", "CKMBINDEX", "TROPONINI" in the last 168 hours. BNP: Invalid input(s): "POCBNP" CBG: No results for input(s): "GLUCAP" in the last 168 hours. D-Dimer No results for input(s): "DDIMER" in the last 72 hours. Hgb A1c No results for input(s): "HGBA1C" in the last 72 hours. Lipid Profile No results for input(s): "CHOL", "HDL", "LDLCALC", "TRIG", "CHOLHDL", "LDLDIRECT" in the last 72 hours. Thyroid function studies No results for input(s): "TSH", "T4TOTAL", "T3FREE", "THYROIDAB" in the last 72 hours.  Invalid input(s): "FREET3" Anemia work up No results for input(s): "VITAMINB12", "FOLATE", "FERRITIN", "TIBC", "IRON", "RETICCTPCT" in the last 72 hours. Urinalysis    Component Value Date/Time   COLORURINE YELLOW 02/01/2024 0610   APPEARANCEUR HAZY (A) 02/01/2024 0610   LABSPEC >1.046 (H) 02/01/2024 0610   PHURINE 6.0 02/01/2024 0610   GLUCOSEU NEGATIVE 02/01/2024 0610   HGBUR NEGATIVE 02/01/2024 0610   BILIRUBINUR NEGATIVE 02/01/2024 0610   KETONESUR NEGATIVE 02/01/2024 0610   PROTEINUR 30 (A) 02/01/2024 0610   UROBILINOGEN 0.2 04/02/2009 1041   NITRITE POSITIVE (A) 02/01/2024 0610    LEUKOCYTESUR MODERATE (A) 02/01/2024 0610   Sepsis Labs Recent Labs  Lab 02/02/24 0410 02/03/24 0250 02/04/24 0645 02/05/24 0456  WBC 6.3 6.9 7.1 5.3   Microbiology No results found for this or any previous visit (from the past 240 hours).  Time coordinating discharge: 38 mins  SIGNED:  Faustino Hook, MD  [facility] 02/05/2024, 10:09 AM How to contact the [facility] Attending or Consulting provider 7A - 7P or covering provider during after hours 7P -7A, for this patient?  Check the care team in [facility] and look for a) attending/consulting [facility] provider listed and b) the [facility] team listed Log into [email or web address] and use [facility] universal password to access. If you do not have the password, please contact the hospital operator. Locate the [facility] provider you are looking for under [facility] and page to a number that you can be directly reached. If you still have difficulty reaching the provider, please page the [facility] (Director on Call) for the Hospitalists listed on amion for assistance.

## 2024-02-05 NOTE — Progress Notes (Signed)
 [facility]  Eating and having Bms. Wanting to go.  BP (!) 140/70 (BP Location: Right Arm)   Pulse 81   Temp (!) 97.4 F (36.3 C) (Oral)   Resp 19   Ht 6\' 1"  (1.854 m)   Wt 94.8 kg   SpO2 95%   BMI 27.57 kg/m  Slightly distended, says is his baseline, appropriately tender, staples c/d/I with no erythema or drainage  Patient s/p SBR for recurrent volvulus. Doing well.  Soft diet Bowel regimen Can dc home after tolerates   Future Appointments  Date Time Provider Department Center  02/14/2024  2:45 PM Awilda Bogus, MD [facility] None    Deena Farrier, MD [facility] [address] [place], [place] [address] [phone] (office)

## 2024-02-05 NOTE — Plan of Care (Signed)

## 2024-02-07 ENCOUNTER — Telehealth: Payer: Self-pay | Admitting: *Deleted

## 2024-02-07 NOTE — Telephone Encounter (Addendum)
 Surgical Date: 02/01/2024 Procedure: Ex Lap, Excision Small Intestine   Received call from patient [phone]~ telephone.   Patient reports increased ringing in his ears since discharge from hospital. States that he had ringing in his ears from previous surgery, but this is worse.   Noted that patient was advised to follow up with PCP for tinnitus at last follow up from prior surgery. Patient states that this only occurs after surgery.   Patient also reports some numbness to the front of bilateral legs. Advised that some skin sensation changes are to be expected after surgery. Advised if tingling/ numbness worsens to go to ER for evaluation.   Please advise of further recommendations.

## 2024-02-08 NOTE — Telephone Encounter (Signed)
Call placed to patient. LMTRC.  

## 2024-02-11 NOTE — Telephone Encounter (Signed)
Call placed to patient and patient made aware.  

## 2024-02-14 ENCOUNTER — Encounter: Payer: Self-pay | Admitting: General Surgery

## 2024-02-14 ENCOUNTER — Ambulatory Visit (INDEPENDENT_AMBULATORY_CARE_PROVIDER_SITE_OTHER): Payer: Self-pay | Admitting: General Surgery

## 2024-02-14 VITALS — BP 114/69 | HR 86 | Temp 97.5°F | Resp 16 | Ht 73.0 in | Wt 193.0 lb

## 2024-02-14 DIAGNOSIS — K562 Volvulus: Secondary | ICD-10-CM

## 2024-02-14 DIAGNOSIS — H9313 Tinnitus, bilateral: Secondary | ICD-10-CM

## 2024-02-14 NOTE — Progress Notes (Signed)
 [facility]  Patient doing fair. His Diarrhea is some what better after stopping his colace. He says the frequency is down. He is eating but not large meals.   He says his incision is very sensitive. He also complaining of the ringing in his ears again but I have no reason for this and have told him multiple times to go to the PCP. He says he sees him in July.  Patient describes some numbness on the anterior legs bilaterally. He says that this is constant. He was supine in the OR and not in stirrups so again I have no idea what would be causing this. There is no muscle weakness described.  BP 114/69   Pulse 86   Temp (!) 97.5 F (36.4 C) (Oral)   Resp 16   Ht 6\' 1"  (1.854 m)   Wt 193 lb (87.5 kg)   SpO2 97%   BMI 25.46 kg/m  Soft, mildly distended, appropriately tender, staples c/d/I with no extending erythema, some minor erythema at the entry sites but limited from irritation, no drainage, steri strips placed and staples removed    Patient s/p Ex lap and SBR for recurrent volvulus of the SB. He is eating and having Bms. His diarrhea is improving.   Keep stools regular and soft. You can take in smaller more frequent meals  I cannot explain the upper leg numbness or the ringing in the ears, monitor this and discuss with PCP.  You can try to add fiber to your diet, or take benefiber/ metamucil if you continue to have looser stools to see if that will help bulk it up.   Future Appointments  Date Time Provider Department Center  03/18/2024 10:15 AM Awilda Bogus, MD [facility] None   Deena Farrier, MD [facility] [address] [place], [place] [address] [phone] (office)

## 2024-02-14 NOTE — Patient Instructions (Addendum)
 Keep stools regular and soft. You can take in smaller more frequent meals  I cannot explain the upper leg numbness or the ringing in the ears, monitor this and discuss with PCP.  You can try to add fiber to your diet, or take benefiber/ metamucil if you continue to have looser stools to see if that will help bulk it up.

## 2024-02-26 ENCOUNTER — Telehealth: Payer: Self-pay | Admitting: *Deleted

## 2024-02-26 NOTE — Telephone Encounter (Signed)
 Surgical Date: 02/01/2024 Procedure: Excision Small Intestine   Received call from patient [phone]~ telephone.   Patient reports that he continues to have loose stools. States that he is having BM's approximately every 4 hours. Patient states that he is taking fiber and imodium. States that when he takes the imodium, it does slow down loose stools, but then they return. Patient did state that he is only taking imodium intermittently as needed.   Advised patient that since a portion of his intestine was removed, there is not as much surface area for fluids/ nutrition to be absorbed. Advised that fiber and imodium are used to slow down the peristalsis of the intestines so that they have more time to absorb excess liquids.   Advised that imodium may be required long term. Advised to continue imodium 3-4 x daily, especially prior to meals.   Verbalized understanding.

## 2024-03-18 ENCOUNTER — Encounter: Admitting: General Surgery

## 2024-04-02 ENCOUNTER — Ambulatory Visit (INDEPENDENT_AMBULATORY_CARE_PROVIDER_SITE_OTHER): Admitting: General Surgery

## 2024-04-02 VITALS — BP 114/58 | HR 81 | Temp 97.4°F | Resp 18 | Ht 73.0 in | Wt 184.0 lb

## 2024-04-02 DIAGNOSIS — K562 Volvulus: Secondary | ICD-10-CM

## 2024-04-02 DIAGNOSIS — K529 Noninfective gastroenteritis and colitis, unspecified: Secondary | ICD-10-CM

## 2024-04-02 NOTE — Progress Notes (Signed)
 [facility]  Patient having diarrhea. Says he has 4-5 loose stools a day. He says he has lost 30 lbs of weight. He is eating but says he feels poorly. His PCP checked labs but no stool studies. He has tried some lomotil prescribed by his PCP for his diarrhea and says it is not working.   BP (!) 114/58   Pulse 81   Temp (!) 97.4 F (36.3 C) (Oral)   Resp 18   Ht 6' 1 (1.854 m)   Wt 184 lb (83.5 kg)   SpO2 95%   BMI 24.28 kg/m  Soft, nondistended, nontender   Patient s/p SBR for volvulus. Having diarrhea. Not sure why. He could have infection or bacterial overgrowth.   Will get the labs from your PCP and new stool studies  Will see if there is any reason for the continued diarrhea. Will see you next week and follow up.  Limit juices as these can cause diarrhea.  Once we get the stool studies back we can determine if you are able to take imodium before meals.   Future Appointments  Date Time Provider Department Center  04/08/2024 11:30 AM Kallie Manuelita BROCKS, MD [facility] None   Manuelita Kallie, MD [facility] [address] [place] [place], [place] [address] [phone] (office)

## 2024-04-02 NOTE — Patient Instructions (Addendum)
 Will get a full set of labs and stool studies. Will see if there is any reason for the continued diarrhea. Will see you next week and follow up.  Limit juices as these can cause diarrhea.  Once we get the stool studies back we can determine if you are able to take imodium before meals.

## 2024-04-08 ENCOUNTER — Ambulatory Visit (INDEPENDENT_AMBULATORY_CARE_PROVIDER_SITE_OTHER): Admitting: General Surgery

## 2024-04-08 ENCOUNTER — Encounter: Payer: Self-pay | Admitting: General Surgery

## 2024-04-08 VITALS — BP 122/72 | HR 71 | Temp 97.4°F | Resp 14 | Ht 73.0 in | Wt 186.0 lb

## 2024-04-08 DIAGNOSIS — K562 Volvulus: Secondary | ICD-10-CM

## 2024-04-08 NOTE — Progress Notes (Signed)
 [facility]  Doing better. No more diarrhea. Eating and having 1 Bm a day. Still a little mushy.   BP 122/72   Pulse 71   Temp (!) 97.4 F (36.3 C) (Oral)   Resp 14   Ht 6' 1 (1.854 m)   Wt 186 lb (84.4 kg)   SpO2 94%   BMI 24.54 kg/m  Soft, nondistended, nontender midline healing  Patient s/p ex lap, SBR for volvulus. Doing better. Monitor for diarrhea Diet as tolerated No heavy lifting > 10 lbs, excessive bending, pushing, pulling, or squatting for 6-8 weeks after surgery.  Call with issues  Manuelita Pander, MD [facility] [address] [place], [place] [address] [phone] (office)

## 2024-04-08 NOTE — Patient Instructions (Addendum)
 Monitor for diarrhea Diet as tolerated No heavy lifting > 10 lbs, excessive bending, pushing, pulling, or squatting for 6-8 weeks after surgery.   Call with issues
